# Patient Record
Sex: Female | Born: 1935
Health system: Southern US, Community
[De-identification: ages and names within clinical notes are randomized; demographics above are authoritative.]

## PROBLEM LIST (undated history)

## (undated) DIAGNOSIS — R32 Unspecified urinary incontinence: Secondary | ICD-10-CM

## (undated) DIAGNOSIS — D638 Anemia in other chronic diseases classified elsewhere: Secondary | ICD-10-CM

## (undated) DIAGNOSIS — I050 Rheumatic mitral stenosis: Secondary | ICD-10-CM

## (undated) DIAGNOSIS — I252 Old myocardial infarction: Secondary | ICD-10-CM

## (undated) DIAGNOSIS — I5032 Chronic diastolic (congestive) heart failure: Secondary | ICD-10-CM

## (undated) DIAGNOSIS — E785 Hyperlipidemia, unspecified: Secondary | ICD-10-CM

## (undated) DIAGNOSIS — R42 Dizziness and giddiness: Secondary | ICD-10-CM

## (undated) DIAGNOSIS — I272 Pulmonary hypertension, unspecified: Secondary | ICD-10-CM

## (undated) DIAGNOSIS — I351 Nonrheumatic aortic (valve) insufficiency: Secondary | ICD-10-CM

## (undated) DIAGNOSIS — E559 Vitamin D deficiency, unspecified: Secondary | ICD-10-CM

## (undated) DIAGNOSIS — E039 Hypothyroidism, unspecified: Secondary | ICD-10-CM

## (undated) DIAGNOSIS — E119 Type 2 diabetes mellitus without complications: Secondary | ICD-10-CM

## (undated) DIAGNOSIS — I4892 Unspecified atrial flutter: Secondary | ICD-10-CM

## (undated) DIAGNOSIS — I4821 Permanent atrial fibrillation: Secondary | ICD-10-CM

## (undated) DIAGNOSIS — I10 Essential (primary) hypertension: Secondary | ICD-10-CM

## (undated) HISTORY — DX: Unspecified atrial flutter: I48.92

## (undated) HISTORY — DX: Permanent atrial fibrillation: I48.21

## (undated) HISTORY — DX: Vitamin D deficiency, unspecified: E55.9

## (undated) HISTORY — DX: Anemia in other chronic diseases classified elsewhere: D63.8

## (undated) HISTORY — DX: Hyperlipidemia, unspecified: E78.5

## (undated) HISTORY — DX: Rheumatic mitral stenosis: I05.0

## (undated) HISTORY — DX: Hypothyroidism, unspecified: E03.9

## (undated) HISTORY — PX: CATARACT EXTRACTION: SUR2

## (undated) HISTORY — DX: Old myocardial infarction: I25.2

## (undated) HISTORY — DX: Pulmonary hypertension, unspecified: I27.20

## (undated) HISTORY — DX: Essential (primary) hypertension: I10

## (undated) HISTORY — DX: Nonrheumatic aortic (valve) insufficiency: I35.1

## (undated) HISTORY — DX: Dizziness and giddiness: R42

## (undated) HISTORY — DX: Chronic diastolic (congestive) heart failure: I50.32

---

## 2011-09-26 DIAGNOSIS — D638 Anemia in other chronic diseases classified elsewhere: Secondary | ICD-10-CM | POA: Diagnosis not present

## 2011-09-26 DIAGNOSIS — I10 Essential (primary) hypertension: Secondary | ICD-10-CM | POA: Diagnosis not present

## 2011-09-26 DIAGNOSIS — M2550 Pain in unspecified joint: Secondary | ICD-10-CM | POA: Diagnosis not present

## 2011-09-26 DIAGNOSIS — Z0000 Encounter for general adult medical examination without abnormal findings: Secondary | ICD-10-CM | POA: Diagnosis not present

## 2011-09-26 DIAGNOSIS — E119 Type 2 diabetes mellitus without complications: Secondary | ICD-10-CM | POA: Diagnosis not present

## 2011-09-26 DIAGNOSIS — E039 Hypothyroidism, unspecified: Secondary | ICD-10-CM | POA: Diagnosis not present

## 2011-09-26 DIAGNOSIS — E785 Hyperlipidemia, unspecified: Secondary | ICD-10-CM | POA: Diagnosis not present

## 2011-09-26 DIAGNOSIS — D518 Other vitamin B12 deficiency anemias: Secondary | ICD-10-CM | POA: Diagnosis not present

## 2011-12-30 DIAGNOSIS — I10 Essential (primary) hypertension: Secondary | ICD-10-CM | POA: Diagnosis not present

## 2012-01-07 DIAGNOSIS — Z13820 Encounter for screening for osteoporosis: Secondary | ICD-10-CM | POA: Diagnosis not present

## 2012-01-07 DIAGNOSIS — Z780 Asymptomatic menopausal state: Secondary | ICD-10-CM | POA: Diagnosis not present

## 2012-04-22 DIAGNOSIS — Z23 Encounter for immunization: Secondary | ICD-10-CM | POA: Diagnosis not present

## 2012-05-06 DIAGNOSIS — I10 Essential (primary) hypertension: Secondary | ICD-10-CM | POA: Diagnosis not present

## 2012-05-06 DIAGNOSIS — E039 Hypothyroidism, unspecified: Secondary | ICD-10-CM | POA: Diagnosis not present

## 2012-05-06 DIAGNOSIS — E119 Type 2 diabetes mellitus without complications: Secondary | ICD-10-CM | POA: Diagnosis not present

## 2012-06-23 DIAGNOSIS — I5032 Chronic diastolic (congestive) heart failure: Secondary | ICD-10-CM

## 2012-06-23 HISTORY — DX: Chronic diastolic (congestive) heart failure: I50.32

## 2012-08-12 DIAGNOSIS — E119 Type 2 diabetes mellitus without complications: Secondary | ICD-10-CM | POA: Diagnosis not present

## 2012-08-12 DIAGNOSIS — I10 Essential (primary) hypertension: Secondary | ICD-10-CM | POA: Diagnosis not present

## 2012-08-12 DIAGNOSIS — D638 Anemia in other chronic diseases classified elsewhere: Secondary | ICD-10-CM | POA: Diagnosis not present

## 2012-10-05 DIAGNOSIS — E119 Type 2 diabetes mellitus without complications: Secondary | ICD-10-CM | POA: Diagnosis not present

## 2012-10-05 DIAGNOSIS — J90 Pleural effusion, not elsewhere classified: Secondary | ICD-10-CM | POA: Diagnosis not present

## 2012-10-05 DIAGNOSIS — I252 Old myocardial infarction: Secondary | ICD-10-CM | POA: Diagnosis not present

## 2012-10-05 DIAGNOSIS — I5031 Acute diastolic (congestive) heart failure: Secondary | ICD-10-CM | POA: Diagnosis not present

## 2012-10-05 DIAGNOSIS — I4892 Unspecified atrial flutter: Secondary | ICD-10-CM | POA: Diagnosis not present

## 2012-10-05 DIAGNOSIS — I10 Essential (primary) hypertension: Secondary | ICD-10-CM | POA: Diagnosis not present

## 2012-10-05 DIAGNOSIS — D649 Anemia, unspecified: Secondary | ICD-10-CM | POA: Diagnosis not present

## 2012-10-05 DIAGNOSIS — I270 Primary pulmonary hypertension: Secondary | ICD-10-CM | POA: Diagnosis not present

## 2012-10-05 DIAGNOSIS — R0902 Hypoxemia: Secondary | ICD-10-CM | POA: Diagnosis not present

## 2012-10-05 DIAGNOSIS — R5381 Other malaise: Secondary | ICD-10-CM | POA: Diagnosis not present

## 2012-10-05 DIAGNOSIS — I517 Cardiomegaly: Secondary | ICD-10-CM | POA: Diagnosis not present

## 2012-10-05 DIAGNOSIS — E039 Hypothyroidism, unspecified: Secondary | ICD-10-CM | POA: Diagnosis not present

## 2012-10-05 DIAGNOSIS — J810 Acute pulmonary edema: Secondary | ICD-10-CM | POA: Diagnosis not present

## 2012-10-05 DIAGNOSIS — I509 Heart failure, unspecified: Secondary | ICD-10-CM | POA: Diagnosis not present

## 2012-10-05 DIAGNOSIS — I059 Rheumatic mitral valve disease, unspecified: Secondary | ICD-10-CM | POA: Diagnosis not present

## 2012-10-05 DIAGNOSIS — K2990 Gastroduodenitis, unspecified, without bleeding: Secondary | ICD-10-CM | POA: Diagnosis not present

## 2012-10-05 DIAGNOSIS — Z6841 Body Mass Index (BMI) 40.0 and over, adult: Secondary | ICD-10-CM | POA: Diagnosis not present

## 2012-10-05 DIAGNOSIS — D509 Iron deficiency anemia, unspecified: Secondary | ICD-10-CM | POA: Diagnosis not present

## 2012-10-05 DIAGNOSIS — K2970 Gastritis, unspecified, without bleeding: Secondary | ICD-10-CM | POA: Diagnosis present

## 2012-10-05 DIAGNOSIS — E785 Hyperlipidemia, unspecified: Secondary | ICD-10-CM | POA: Diagnosis present

## 2012-10-05 DIAGNOSIS — I4891 Unspecified atrial fibrillation: Secondary | ICD-10-CM | POA: Diagnosis not present

## 2012-10-05 DIAGNOSIS — J811 Chronic pulmonary edema: Secondary | ICD-10-CM | POA: Diagnosis not present

## 2012-10-05 DIAGNOSIS — I2789 Other specified pulmonary heart diseases: Secondary | ICD-10-CM | POA: Diagnosis not present

## 2012-10-05 DIAGNOSIS — I5033 Acute on chronic diastolic (congestive) heart failure: Secondary | ICD-10-CM | POA: Diagnosis not present

## 2012-10-05 DIAGNOSIS — R0602 Shortness of breath: Secondary | ICD-10-CM | POA: Diagnosis not present

## 2012-10-19 DIAGNOSIS — I4892 Unspecified atrial flutter: Secondary | ICD-10-CM | POA: Diagnosis not present

## 2012-10-19 DIAGNOSIS — I10 Essential (primary) hypertension: Secondary | ICD-10-CM | POA: Diagnosis not present

## 2012-10-19 DIAGNOSIS — I501 Left ventricular failure: Secondary | ICD-10-CM | POA: Diagnosis not present

## 2012-10-19 DIAGNOSIS — I4891 Unspecified atrial fibrillation: Secondary | ICD-10-CM | POA: Diagnosis not present

## 2012-10-19 DIAGNOSIS — E119 Type 2 diabetes mellitus without complications: Secondary | ICD-10-CM | POA: Diagnosis not present

## 2012-10-19 DIAGNOSIS — Z9981 Dependence on supplemental oxygen: Secondary | ICD-10-CM | POA: Diagnosis not present

## 2012-10-19 DIAGNOSIS — Z794 Long term (current) use of insulin: Secondary | ICD-10-CM | POA: Diagnosis not present

## 2012-10-21 DIAGNOSIS — I4892 Unspecified atrial flutter: Secondary | ICD-10-CM | POA: Diagnosis not present

## 2012-10-21 DIAGNOSIS — I10 Essential (primary) hypertension: Secondary | ICD-10-CM | POA: Diagnosis not present

## 2012-10-21 DIAGNOSIS — I501 Left ventricular failure: Secondary | ICD-10-CM | POA: Diagnosis not present

## 2012-10-21 DIAGNOSIS — Z9981 Dependence on supplemental oxygen: Secondary | ICD-10-CM | POA: Diagnosis not present

## 2012-10-21 DIAGNOSIS — I4891 Unspecified atrial fibrillation: Secondary | ICD-10-CM | POA: Diagnosis not present

## 2012-10-21 DIAGNOSIS — E119 Type 2 diabetes mellitus without complications: Secondary | ICD-10-CM | POA: Diagnosis not present

## 2012-10-25 DIAGNOSIS — D649 Anemia, unspecified: Secondary | ICD-10-CM | POA: Diagnosis not present

## 2012-10-25 DIAGNOSIS — E8779 Other fluid overload: Secondary | ICD-10-CM | POA: Diagnosis not present

## 2012-10-25 DIAGNOSIS — I10 Essential (primary) hypertension: Secondary | ICD-10-CM | POA: Diagnosis not present

## 2012-10-25 DIAGNOSIS — E119 Type 2 diabetes mellitus without complications: Secondary | ICD-10-CM | POA: Diagnosis not present

## 2012-10-25 DIAGNOSIS — I4892 Unspecified atrial flutter: Secondary | ICD-10-CM | POA: Diagnosis not present

## 2012-10-26 DIAGNOSIS — E119 Type 2 diabetes mellitus without complications: Secondary | ICD-10-CM | POA: Diagnosis not present

## 2012-10-26 DIAGNOSIS — Z9981 Dependence on supplemental oxygen: Secondary | ICD-10-CM | POA: Diagnosis not present

## 2012-10-26 DIAGNOSIS — I501 Left ventricular failure: Secondary | ICD-10-CM | POA: Diagnosis not present

## 2012-10-26 DIAGNOSIS — I10 Essential (primary) hypertension: Secondary | ICD-10-CM | POA: Diagnosis not present

## 2012-10-26 DIAGNOSIS — I4891 Unspecified atrial fibrillation: Secondary | ICD-10-CM | POA: Diagnosis not present

## 2012-10-26 DIAGNOSIS — I4892 Unspecified atrial flutter: Secondary | ICD-10-CM | POA: Diagnosis not present

## 2012-10-29 DIAGNOSIS — Z9981 Dependence on supplemental oxygen: Secondary | ICD-10-CM | POA: Diagnosis not present

## 2012-10-29 DIAGNOSIS — I501 Left ventricular failure: Secondary | ICD-10-CM | POA: Diagnosis not present

## 2012-10-29 DIAGNOSIS — I4891 Unspecified atrial fibrillation: Secondary | ICD-10-CM | POA: Diagnosis not present

## 2012-10-29 DIAGNOSIS — I4892 Unspecified atrial flutter: Secondary | ICD-10-CM | POA: Diagnosis not present

## 2012-10-29 DIAGNOSIS — I10 Essential (primary) hypertension: Secondary | ICD-10-CM | POA: Diagnosis not present

## 2012-10-29 DIAGNOSIS — E119 Type 2 diabetes mellitus without complications: Secondary | ICD-10-CM | POA: Diagnosis not present

## 2012-11-01 DIAGNOSIS — I10 Essential (primary) hypertension: Secondary | ICD-10-CM | POA: Diagnosis not present

## 2012-11-01 DIAGNOSIS — I5032 Chronic diastolic (congestive) heart failure: Secondary | ICD-10-CM | POA: Diagnosis not present

## 2012-11-01 DIAGNOSIS — I2789 Other specified pulmonary heart diseases: Secondary | ICD-10-CM | POA: Diagnosis not present

## 2012-11-01 DIAGNOSIS — I4891 Unspecified atrial fibrillation: Secondary | ICD-10-CM | POA: Diagnosis not present

## 2012-11-02 DIAGNOSIS — I501 Left ventricular failure: Secondary | ICD-10-CM | POA: Diagnosis not present

## 2012-11-02 DIAGNOSIS — I4891 Unspecified atrial fibrillation: Secondary | ICD-10-CM | POA: Diagnosis not present

## 2012-11-02 DIAGNOSIS — I10 Essential (primary) hypertension: Secondary | ICD-10-CM | POA: Diagnosis not present

## 2012-11-02 DIAGNOSIS — E119 Type 2 diabetes mellitus without complications: Secondary | ICD-10-CM | POA: Diagnosis not present

## 2012-11-02 DIAGNOSIS — Z9981 Dependence on supplemental oxygen: Secondary | ICD-10-CM | POA: Diagnosis not present

## 2012-11-02 DIAGNOSIS — I4892 Unspecified atrial flutter: Secondary | ICD-10-CM | POA: Diagnosis not present

## 2012-11-05 DIAGNOSIS — I501 Left ventricular failure: Secondary | ICD-10-CM | POA: Diagnosis not present

## 2012-11-05 DIAGNOSIS — E119 Type 2 diabetes mellitus without complications: Secondary | ICD-10-CM | POA: Diagnosis not present

## 2012-11-05 DIAGNOSIS — I10 Essential (primary) hypertension: Secondary | ICD-10-CM | POA: Diagnosis not present

## 2012-11-05 DIAGNOSIS — I4891 Unspecified atrial fibrillation: Secondary | ICD-10-CM | POA: Diagnosis not present

## 2012-11-05 DIAGNOSIS — Z9981 Dependence on supplemental oxygen: Secondary | ICD-10-CM | POA: Diagnosis not present

## 2012-11-05 DIAGNOSIS — I4892 Unspecified atrial flutter: Secondary | ICD-10-CM | POA: Diagnosis not present

## 2012-11-10 DIAGNOSIS — Z79899 Other long term (current) drug therapy: Secondary | ICD-10-CM | POA: Diagnosis not present

## 2012-11-11 DIAGNOSIS — E119 Type 2 diabetes mellitus without complications: Secondary | ICD-10-CM | POA: Diagnosis not present

## 2012-11-11 DIAGNOSIS — I4891 Unspecified atrial fibrillation: Secondary | ICD-10-CM | POA: Diagnosis not present

## 2012-11-11 DIAGNOSIS — I501 Left ventricular failure: Secondary | ICD-10-CM | POA: Diagnosis not present

## 2012-11-11 DIAGNOSIS — I4892 Unspecified atrial flutter: Secondary | ICD-10-CM | POA: Diagnosis not present

## 2012-11-11 DIAGNOSIS — Z9981 Dependence on supplemental oxygen: Secondary | ICD-10-CM | POA: Diagnosis not present

## 2012-11-11 DIAGNOSIS — I10 Essential (primary) hypertension: Secondary | ICD-10-CM | POA: Diagnosis not present

## 2012-11-19 DIAGNOSIS — I4891 Unspecified atrial fibrillation: Secondary | ICD-10-CM | POA: Diagnosis not present

## 2012-11-19 DIAGNOSIS — E119 Type 2 diabetes mellitus without complications: Secondary | ICD-10-CM | POA: Diagnosis not present

## 2012-11-19 DIAGNOSIS — Z9981 Dependence on supplemental oxygen: Secondary | ICD-10-CM | POA: Diagnosis not present

## 2012-11-19 DIAGNOSIS — I4892 Unspecified atrial flutter: Secondary | ICD-10-CM | POA: Diagnosis not present

## 2012-11-19 DIAGNOSIS — I10 Essential (primary) hypertension: Secondary | ICD-10-CM | POA: Diagnosis not present

## 2012-11-19 DIAGNOSIS — I501 Left ventricular failure: Secondary | ICD-10-CM | POA: Diagnosis not present

## 2012-11-27 DIAGNOSIS — I4891 Unspecified atrial fibrillation: Secondary | ICD-10-CM | POA: Diagnosis not present

## 2012-11-27 DIAGNOSIS — Z9981 Dependence on supplemental oxygen: Secondary | ICD-10-CM | POA: Diagnosis not present

## 2012-11-27 DIAGNOSIS — I10 Essential (primary) hypertension: Secondary | ICD-10-CM | POA: Diagnosis not present

## 2012-11-27 DIAGNOSIS — E119 Type 2 diabetes mellitus without complications: Secondary | ICD-10-CM | POA: Diagnosis not present

## 2012-11-27 DIAGNOSIS — I4892 Unspecified atrial flutter: Secondary | ICD-10-CM | POA: Diagnosis not present

## 2012-11-27 DIAGNOSIS — I501 Left ventricular failure: Secondary | ICD-10-CM | POA: Diagnosis not present

## 2012-12-02 DIAGNOSIS — E119 Type 2 diabetes mellitus without complications: Secondary | ICD-10-CM | POA: Diagnosis not present

## 2012-12-08 DIAGNOSIS — I10 Essential (primary) hypertension: Secondary | ICD-10-CM | POA: Diagnosis not present

## 2012-12-08 DIAGNOSIS — I501 Left ventricular failure: Secondary | ICD-10-CM | POA: Diagnosis not present

## 2012-12-08 DIAGNOSIS — I4892 Unspecified atrial flutter: Secondary | ICD-10-CM | POA: Diagnosis not present

## 2012-12-08 DIAGNOSIS — I4891 Unspecified atrial fibrillation: Secondary | ICD-10-CM | POA: Diagnosis not present

## 2012-12-08 DIAGNOSIS — E119 Type 2 diabetes mellitus without complications: Secondary | ICD-10-CM | POA: Diagnosis not present

## 2012-12-08 DIAGNOSIS — Z9981 Dependence on supplemental oxygen: Secondary | ICD-10-CM | POA: Diagnosis not present

## 2012-12-14 DIAGNOSIS — I10 Essential (primary) hypertension: Secondary | ICD-10-CM | POA: Diagnosis not present

## 2012-12-14 DIAGNOSIS — I2510 Atherosclerotic heart disease of native coronary artery without angina pectoris: Secondary | ICD-10-CM | POA: Diagnosis not present

## 2012-12-14 DIAGNOSIS — I4891 Unspecified atrial fibrillation: Secondary | ICD-10-CM | POA: Diagnosis not present

## 2012-12-14 DIAGNOSIS — I5030 Unspecified diastolic (congestive) heart failure: Secondary | ICD-10-CM | POA: Diagnosis not present

## 2013-02-03 DIAGNOSIS — E119 Type 2 diabetes mellitus without complications: Secondary | ICD-10-CM | POA: Diagnosis not present

## 2013-02-03 DIAGNOSIS — D649 Anemia, unspecified: Secondary | ICD-10-CM | POA: Diagnosis not present

## 2013-02-03 DIAGNOSIS — I4892 Unspecified atrial flutter: Secondary | ICD-10-CM | POA: Diagnosis not present

## 2013-02-03 DIAGNOSIS — I10 Essential (primary) hypertension: Secondary | ICD-10-CM | POA: Diagnosis not present

## 2013-04-07 DIAGNOSIS — E785 Hyperlipidemia, unspecified: Secondary | ICD-10-CM | POA: Diagnosis not present

## 2013-04-08 DIAGNOSIS — Z23 Encounter for immunization: Secondary | ICD-10-CM | POA: Diagnosis not present

## 2013-04-12 DIAGNOSIS — I10 Essential (primary) hypertension: Secondary | ICD-10-CM | POA: Diagnosis not present

## 2013-04-12 DIAGNOSIS — I4891 Unspecified atrial fibrillation: Secondary | ICD-10-CM | POA: Diagnosis not present

## 2013-04-12 DIAGNOSIS — I2510 Atherosclerotic heart disease of native coronary artery without angina pectoris: Secondary | ICD-10-CM | POA: Diagnosis not present

## 2013-04-12 DIAGNOSIS — I5032 Chronic diastolic (congestive) heart failure: Secondary | ICD-10-CM | POA: Diagnosis not present

## 2013-05-11 DIAGNOSIS — E039 Hypothyroidism, unspecified: Secondary | ICD-10-CM | POA: Diagnosis not present

## 2013-05-11 DIAGNOSIS — D649 Anemia, unspecified: Secondary | ICD-10-CM | POA: Diagnosis not present

## 2013-05-11 DIAGNOSIS — M899 Disorder of bone, unspecified: Secondary | ICD-10-CM | POA: Diagnosis not present

## 2013-05-11 DIAGNOSIS — E119 Type 2 diabetes mellitus without complications: Secondary | ICD-10-CM | POA: Diagnosis not present

## 2013-05-11 DIAGNOSIS — I10 Essential (primary) hypertension: Secondary | ICD-10-CM | POA: Diagnosis not present

## 2013-07-18 DIAGNOSIS — I252 Old myocardial infarction: Secondary | ICD-10-CM

## 2013-07-18 HISTORY — DX: Old myocardial infarction: I25.2

## 2013-08-10 DIAGNOSIS — I4892 Unspecified atrial flutter: Secondary | ICD-10-CM | POA: Diagnosis not present

## 2013-08-11 DIAGNOSIS — E039 Hypothyroidism, unspecified: Secondary | ICD-10-CM | POA: Diagnosis not present

## 2013-08-11 DIAGNOSIS — D509 Iron deficiency anemia, unspecified: Secondary | ICD-10-CM | POA: Diagnosis not present

## 2013-08-18 DIAGNOSIS — I4892 Unspecified atrial flutter: Secondary | ICD-10-CM | POA: Diagnosis not present

## 2013-08-18 DIAGNOSIS — Z7901 Long term (current) use of anticoagulants: Secondary | ICD-10-CM | POA: Diagnosis not present

## 2013-08-23 DIAGNOSIS — Z7901 Long term (current) use of anticoagulants: Secondary | ICD-10-CM | POA: Diagnosis not present

## 2013-08-23 DIAGNOSIS — I4892 Unspecified atrial flutter: Secondary | ICD-10-CM | POA: Diagnosis not present

## 2013-09-06 DIAGNOSIS — I4892 Unspecified atrial flutter: Secondary | ICD-10-CM | POA: Diagnosis not present

## 2013-09-06 DIAGNOSIS — Z7901 Long term (current) use of anticoagulants: Secondary | ICD-10-CM | POA: Diagnosis not present

## 2013-10-11 ENCOUNTER — Emergency Department (HOSPITAL_COMMUNITY)
Admission: EM | Admit: 2013-10-11 | Discharge: 2013-10-11 | Disposition: A | Discharge status: Home / Self Care | Payer: Medicare Other | Attending: Emergency Medicine | Admitting: Emergency Medicine

## 2013-10-11 ENCOUNTER — Emergency Department (HOSPITAL_COMMUNITY): Payer: Medicare Other

## 2013-10-11 ENCOUNTER — Encounter (HOSPITAL_COMMUNITY): Payer: Self-pay | Admitting: Emergency Medicine

## 2013-10-11 DIAGNOSIS — I509 Heart failure, unspecified: Secondary | ICD-10-CM | POA: Diagnosis not present

## 2013-10-11 DIAGNOSIS — W19XXXA Unspecified fall, initial encounter: Secondary | ICD-10-CM

## 2013-10-11 DIAGNOSIS — Z7901 Long term (current) use of anticoagulants: Secondary | ICD-10-CM | POA: Insufficient documentation

## 2013-10-11 DIAGNOSIS — M25559 Pain in unspecified hip: Secondary | ICD-10-CM | POA: Diagnosis not present

## 2013-10-11 DIAGNOSIS — R791 Abnormal coagulation profile: Secondary | ICD-10-CM | POA: Insufficient documentation

## 2013-10-11 DIAGNOSIS — S79929A Unspecified injury of unspecified thigh, initial encounter: Principal | ICD-10-CM

## 2013-10-11 DIAGNOSIS — R51 Headache: Secondary | ICD-10-CM | POA: Diagnosis not present

## 2013-10-11 DIAGNOSIS — M25562 Pain in left knee: Secondary | ICD-10-CM

## 2013-10-11 DIAGNOSIS — Z79899 Other long term (current) drug therapy: Secondary | ICD-10-CM | POA: Diagnosis not present

## 2013-10-11 DIAGNOSIS — M25569 Pain in unspecified knee: Secondary | ICD-10-CM | POA: Diagnosis not present

## 2013-10-11 DIAGNOSIS — E119 Type 2 diabetes mellitus without complications: Secondary | ICD-10-CM | POA: Insufficient documentation

## 2013-10-11 DIAGNOSIS — Y929 Unspecified place or not applicable: Secondary | ICD-10-CM | POA: Insufficient documentation

## 2013-10-11 DIAGNOSIS — Y9389 Activity, other specified: Secondary | ICD-10-CM | POA: Insufficient documentation

## 2013-10-11 DIAGNOSIS — S99929A Unspecified injury of unspecified foot, initial encounter: Secondary | ICD-10-CM

## 2013-10-11 DIAGNOSIS — S99919A Unspecified injury of unspecified ankle, initial encounter: Secondary | ICD-10-CM

## 2013-10-11 DIAGNOSIS — S79919A Unspecified injury of unspecified hip, initial encounter: Secondary | ICD-10-CM | POA: Insufficient documentation

## 2013-10-11 DIAGNOSIS — S0993XA Unspecified injury of face, initial encounter: Secondary | ICD-10-CM | POA: Diagnosis not present

## 2013-10-11 DIAGNOSIS — I4891 Unspecified atrial fibrillation: Secondary | ICD-10-CM | POA: Insufficient documentation

## 2013-10-11 DIAGNOSIS — S8990XA Unspecified injury of unspecified lower leg, initial encounter: Secondary | ICD-10-CM | POA: Insufficient documentation

## 2013-10-11 DIAGNOSIS — S0990XA Unspecified injury of head, initial encounter: Secondary | ICD-10-CM | POA: Diagnosis not present

## 2013-10-11 DIAGNOSIS — R296 Repeated falls: Secondary | ICD-10-CM | POA: Insufficient documentation

## 2013-10-11 HISTORY — DX: Type 2 diabetes mellitus without complications: E11.9

## 2013-10-11 LAB — COMPREHENSIVE METABOLIC PANEL
ALT: 11 U/L (ref 0–35)
AST: 16 U/L (ref 0–37)
Albumin: 3.5 g/dL (ref 3.5–5.2)
Alkaline Phosphatase: 82 U/L (ref 39–117)
BILIRUBIN TOTAL: 0.4 mg/dL (ref 0.3–1.2)
BUN: 18 mg/dL (ref 6–23)
CALCIUM: 9.6 mg/dL (ref 8.4–10.5)
CHLORIDE: 103 meq/L (ref 96–112)
CO2: 22 meq/L (ref 19–32)
CREATININE: 0.84 mg/dL (ref 0.50–1.10)
GFR, EST AFRICAN AMERICAN: 75 mL/min — AB (ref >90)
GFR, EST NON AFRICAN AMERICAN: 64 mL/min — AB (ref >90)
GLUCOSE: 115 mg/dL — AB (ref 70–99)
Potassium: 4.3 mEq/L (ref 3.7–5.3)
Sodium: 137 mEq/L (ref 137–147)
Total Protein: 7.3 g/dL (ref 6.0–8.3)

## 2013-10-11 LAB — URINALYSIS, ROUTINE W REFLEX MICROSCOPIC
BILIRUBIN URINE: NEGATIVE
Glucose, UA: NEGATIVE mg/dL
Ketones, ur: NEGATIVE mg/dL
Leukocytes, UA: NEGATIVE
Nitrite: NEGATIVE
Protein, ur: NEGATIVE mg/dL
Specific Gravity, Urine: 1.016 (ref 1.005–1.030)
Urobilinogen, UA: 1 mg/dL (ref 0.0–1.0)
pH: 6.5 (ref 5.0–8.0)

## 2013-10-11 LAB — CBC WITH DIFFERENTIAL/PLATELET
BASOS ABS: 0 10*3/uL (ref 0.0–0.1)
Basophils Relative: 0 % (ref 0–1)
EOS PCT: 2 % (ref 0–5)
Eosinophils Absolute: 0.1 10*3/uL (ref 0.0–0.7)
HCT: 35.9 % — ABNORMAL LOW (ref 36.0–46.0)
HEMOGLOBIN: 11.9 g/dL — AB (ref 12.0–15.0)
LYMPHS ABS: 2.2 10*3/uL (ref 0.7–4.0)
Lymphocytes Relative: 43 % (ref 12–46)
MCH: 30 pg (ref 26.0–34.0)
MCHC: 33.1 g/dL (ref 30.0–36.0)
MCV: 90.4 fL (ref 78.0–100.0)
MONO ABS: 0.4 10*3/uL (ref 0.1–1.0)
MONOS PCT: 9 % (ref 3–12)
NEUTROS ABS: 2.3 10*3/uL (ref 1.7–7.7)
Neutrophils Relative %: 46 % (ref 43–77)
Platelets: 276 10*3/uL (ref 150–400)
RBC: 3.97 MIL/uL (ref 3.87–5.11)
RDW: 13.5 % (ref 11.5–15.5)
WBC: 5 10*3/uL (ref 4.0–10.5)

## 2013-10-11 LAB — URINE MICROSCOPIC-ADD ON

## 2013-10-11 LAB — CBG MONITORING, ED: Glucose-Capillary: 102 mg/dL — ABNORMAL HIGH (ref 70–99)

## 2013-10-11 LAB — PROTIME-INR
INR: 1.44 (ref 0.00–1.49)
PROTHROMBIN TIME: 17.2 s — AB (ref 11.6–15.2)

## 2013-10-11 MED ORDER — ACETAMINOPHEN 325 MG PO TABS
650.0000 mg | ORAL_TABLET | Freq: Once | ORAL | Status: AC
  Administered 2013-10-11: 650 mg via ORAL
  Filled 2013-10-11: qty 2

## 2013-10-11 NOTE — ED Notes (Signed)
Kelly, PA at bedside. 

## 2013-10-11 NOTE — ED Provider Notes (Signed)
CSN: [id number]     Arrival date & time 10/11/13  1916 History   First MD Initiated Contact with Patient 10/11/13 2057     Chief Complaint  Patient presents with  . Fall    (Consider location/radiation/quality/duration/timing/severity/associated sxs/prior Treatment) HPI Comments: Patient is a 80 year old female with a history of diabetes mellitus, CHF, and atrial fibrillation, on chronic Coumadin, who presents to the emergency department today for fall. Patient states that she lost her balance causing her left side to fall into the wall. Patient is unsure as to whether she hit her head, but she denies loss of consciousness. Patient complaining of aching pain in her left hip, left knee, and left ankle. Patient denies radiation of the pain and any alleviating factors. Pain is aggravated with movement and palpation to the area. Patient also complaining of a right-sided temporal headache that is nonradiating. She denies vision changes, hearing changes or tinnitus, difficulty speaking or swallowing, abdominal pain, back pain, nausea or vomiting, bowel or bladder incontinence, numbness/tingling, and extremity weakness. Patient has been ambulatory since the incident with her cane which she uses to ambulate at baseline. Patient lives in a home with her son.  The history is provided by the patient. No language interpreter was used.    Past Medical History  Diagnosis Date  . CHF (congestive heart failure)   . Diabetes mellitus without complication   . A-fib    Past Surgical History  Procedure Laterality Date  . Cataract extraction     No family history on file. History  Substance Use Topics  . Smoking status: Never Smoker   . Smokeless tobacco: Not on file  . Alcohol Use: Yes   OB History   Grav Para Term Preterm Abortions TAB SAB Ect Mult Living                 Review of Systems  HENT: Negative for hearing loss and tinnitus.   Eyes: Negative for visual disturbance.  Gastrointestinal:  Negative for nausea, vomiting and abdominal pain.  Musculoskeletal: Positive for arthralgias and myalgias. Negative for back pain and neck pain.  Neurological: Positive for headaches. Negative for syncope, speech difficulty, weakness and numbness.  All other systems reviewed and are negative.    Allergies  Review of patient's allergies indicates no known allergies.  Home Medications   Prior to Admission medications   Medication Sig Start Date End Date Taking? Authorizing Provider  Cholecalciferol (VITAMIN D3) 2000 UNITS TABS Take 1 tablet by mouth daily.   Yes Historical Provider, MD  furosemide (LASIX) 20 MG tablet Take 20 mg by mouth daily.   Yes Historical Provider, MD  losartan (COZAAR) 100 MG tablet Take 100 mg by mouth daily.   Yes Historical Provider, MD  metFORMIN (GLUCOPHAGE) 1000 MG tablet Take 1,000 mg by mouth 2 (two) times daily with a meal.   Yes Historical Provider, MD  metoprolol (LOPRESSOR) 50 MG tablet Take 50 mg by mouth 2 (two) times daily.   Yes Historical Provider, MD  Omega-3 Krill Oil 500 MG CAPS Take 1 capsule by mouth daily.   Yes Historical Provider, MD  warfarin (COUMADIN) 2.5 MG tablet Take 1.25-2.5 mg by mouth See admin instructions. Take 0.5 tablet Thursday and Sunday. Take 1 tablet all other days.   Yes Historical Provider, MD   BP 172/68  Pulse 66  Temp(Src) 98.2 F (36.8 C) (Oral)  Resp 16  Wt 165 lb 1 oz (74.872 kg)  SpO2 100%  Physical Exam  Nursing  note and vitals reviewed. Constitutional: She is oriented to person, place, and time. She appears well-developed and well-nourished. No distress.  Patient is pleasant and well-appearing. She answers questions appropriately and follows simple commands.  HENT:  Head: Normocephalic and atraumatic. Head is without raccoon's eyes and without Battle's sign.  Nose: Nose normal.  Mouth/Throat: Oropharynx is clear and moist. No oropharyngeal exudate.  Eyes: Conjunctivae and EOM are normal. Pupils are equal,  round, and reactive to light. No scleral icterus.  Neck: Normal range of motion.  Cardiovascular: Normal rate, regular rhythm and intact distal pulses.   Pulmonary/Chest: Effort normal and breath sounds normal. No respiratory distress. She has no wheezes. She has no rales.  Abdominal: Soft. She exhibits no distension. There is no tenderness. There is no rebound and no guarding.  Musculoskeletal: Normal range of motion.       Left hip: She exhibits tenderness and bony tenderness. She exhibits normal range of motion, normal strength, no crepitus and no deformity.       Left knee: She exhibits ecchymosis and bony tenderness. She exhibits normal range of motion, no swelling, no effusion, no deformity, no erythema, normal alignment, no LCL laxity and no MCL laxity. Tenderness found.       Left ankle: Normal. She exhibits normal range of motion, no swelling and no ecchymosis. No tenderness. Achilles tendon normal.       Lumbar back: Normal.       Left upper leg: Normal.       Left lower leg: Normal.  No left lower extremity shortening or malrotation. Patient with mild ecchymosis to her left knee with mild tenderness to palpation.  Neurological: She is alert and oriented to person, place, and time. She has normal strength. No cranial nerve deficit or sensory deficit. She exhibits normal muscle tone. GCS eye subscore is 4. GCS verbal subscore is 5. GCS motor subscore is 6.  GCS 15. Speech is goal oriented and intelligible. No cranial nerve deficits appreciated; symmetric eyebrow raise, no facial drooping. Patient moves extremities without ataxia. No gross sensory deficits appreciated. Patient ambulatory in ED hallway using cane and one-person assist with slow, steady gait.  Skin: Skin is warm and dry. No rash noted. She is not diaphoretic. No erythema. No pallor.  Psychiatric: She has a normal mood and affect. Her behavior is normal.    ED Course  Procedures (including critical care time) Labs  Review Labs Reviewed  CBC WITH DIFFERENTIAL - Abnormal; Notable for the following:    Hemoglobin 11.9 (*)    HCT 35.9 (*)    All other components within normal limits  COMPREHENSIVE METABOLIC PANEL - Abnormal; Notable for the following:    Glucose, Bld 115 (*)    GFR calc non Af Amer 64 (*)    GFR calc Af Amer 75 (*)    All other components within normal limits  URINALYSIS, ROUTINE W REFLEX MICROSCOPIC - Abnormal; Notable for the following:    Hgb urine dipstick TRACE (*)    All other components within normal limits  PROTIME-INR - Abnormal; Notable for the following:    Prothrombin Time 17.2 (*)    All other components within normal limits  URINE MICROSCOPIC-ADD ON - Abnormal; Notable for the following:    Squamous Epithelial / LPF FEW (*)    Casts HYALINE CASTS (*)    All other components within normal limits  CBG MONITORING, ED - Abnormal; Notable for the following:    Glucose-Capillary 102 (*)  All other components within normal limits    Imaging Review Dg Hip Complete Left  10/11/2013   CLINICAL DATA:  Left leg pain after fall.  EXAM: LEFT HIP - COMPLETE 2+ VIEW  COMPARISON:  DG KNEE COMPLETE 4 VIEWS*L* dated 10/11/2013; DG ANKLE COMPLETE*L* dated 10/11/2013  FINDINGS: Degenerative changes in the lower lumbar spine and hips. No displaced fractures identified. No focal bone lesion or bone destruction.  IMPRESSION: No acute bony abnormalities.   Electronically Signed   By: Lucienne Capers M.D.   On: 10/11/2013 22:01   Dg Ankle Complete Left  10/11/2013   CLINICAL DATA:  Left leg pain after a fall.  EXAM: LEFT ANKLE COMPLETE - 3+ VIEW  COMPARISON:  None.  FINDINGS: There is no evidence of fracture, dislocation, or joint effusion. There is no evidence of arthropathy or other focal bone abnormality. Soft tissues are unremarkable. Vascular calcifications. Plantar and Achilles calcaneal spurs.  IMPRESSION: Negative.   Electronically Signed   By: Lucienne Capers M.D.   On: 10/11/2013  22:03   Ct Head Wo Contrast  10/11/2013   CLINICAL DATA:  Fall  EXAM: CT HEAD WITHOUT CONTRAST  CT CERVICAL SPINE WITHOUT CONTRAST  TECHNIQUE: Multidetector CT imaging of the head and cervical spine was performed following the standard protocol without intravenous contrast. Multiplanar CT image reconstructions of the cervical spine were also generated.  COMPARISON:  None.  FINDINGS: CT HEAD FINDINGS  Global atrophy. Chronic ischemic changes. No mass effect, midline shift, or acute intracranial hemorrhage. Cranium is intact. 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 No fracture. No dislocation. No obvious epidural mass effect to suggest hematoma. High density in the epidural space at C1 region is felt to be within normal limits.  IMPRESSION: No acute intracranial pathology. No evidence of cervical spine injury.   Electronically Signed   By: Maryclare Bean M.D.   On: 10/11/2013 22:33   Ct Cervical Spine Wo Contrast  10/11/2013   CLINICAL DATA:  Fall  EXAM: CT HEAD WITHOUT CONTRAST  CT CERVICAL SPINE WITHOUT CONTRAST  TECHNIQUE: Multidetector CT imaging of the head and cervical spine was performed following the standard protocol without intravenous contrast. Multiplanar CT image reconstructions of the cervical spine were also generated.  COMPARISON:  None.  FINDINGS: CT HEAD FINDINGS  Global atrophy. Chronic ischemic changes. No mass effect, midline shift, or acute intracranial hemorrhage. Cranium is intact. 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 No fracture. No dislocation. No obvious epidural mass effect to suggest hematoma. High density in the epidural space at C1 region is felt to be within normal limits.  IMPRESSION: No acute intracranial pathology. No evidence of cervical spine injury.   Electronically Signed   By: Maryclare Bean M.D.   On: 10/11/2013 22:33   Dg Knee Complete 4  Views Left  10/11/2013   CLINICAL DATA:  Left leg pain after a fall.  EXAM: LEFT KNEE - COMPLETE 4+ VIEW  COMPARISON:  DG HIP COMPLETE*L* dated 10/11/2013  FINDINGS: Degenerative changes with medial compartment narrowing and mild hypertrophic changes. Small effusion. No evidence of acute fracture or dislocation. Vascular calcifications.  IMPRESSION: No acute bony abnormalities. Small effusion and degenerative changes present.   Electronically Signed   By: Lucienne Capers M.D.   On: 10/11/2013 22:02     EKG Interpretation None  MDM   Final diagnoses:  Fall  Hip pain  Knee pain, left  Subtherapeutic international normalized ratio (INR)    10/11/13 2330 - Patient presents to the ED after a fall into the wall today with impact to her L side. Patient mentating at baseline. Neurologic exam is nonfocal. Patient on chronic coumadin; level subtherapeutic today. No significant findings on examination today; TTP appears MSK in origin. Imaging ordered and reviewed which shows no evidence of acute fracture or dislocation. CT head without hemorrhage, hydrocephalus, skull fracture, or midline shift. CT cervical spine without acute findings.   Patient has been able to ambulate with slow steady gait in the ED with her cane and 1 person assist. Given her discomfort related to contusions from her fall, believe patient would be better suited to have a walker while ambulating. Have discussed with family a consultation to care management in the AM. Patient is otherwise stable and appropriate for d/c home where she lives with her son and daughter in law. Return precautions provided and PCP f/u advised. Patient and family agreeable to plan with no unaddressed concerns.  10/12/13 0700 - Have spoken with Ellan Lambert of Care Management regarding having walker delivered to patient's home. Ms. Oren Section has graciously agreed to coordinate this for the patient today. Orders for DME rolling walker placed.   Filed  Vitals:   10/11/13 1922 10/11/13 2005 10/11/13 2100 10/11/13 2356  BP: 158/91 159/95 172/68 168/88  Pulse: 88  66 78  Temp: 98.2 F (36.8 C)   98 F (36.7 C)  TempSrc: Oral   Oral  Resp: 20 19 16 20   Weight: 123XX123 lb 1 oz (74.872 kg)     SpO2: 100% 100% 100% 96%     Antonietta Breach, PA-C 10/12/13 [phone]

## 2013-10-11 NOTE — ED Notes (Signed)
Pt ambulated with cane and one person assist down hallway in POD E with slow, steady gait. PA Claiborne Billings made aware.

## 2013-10-11 NOTE — Discharge Instructions (Signed)
Recommend Tylenol for pain control. You may take 650 mg every 6 hours as needed. Apply ice to areas of pain. Followup with your primary care doctor to discuss your subtherapeutic INR level as well as to ensure resolution of your current symptoms. Return if symptoms worsen.  Contusion A contusion is a deep bruise. Contusions are the result of an injury that caused bleeding under the skin. The contusion may turn blue, purple, or yellow. Minor injuries will give you a painless contusion, but more severe contusions may stay painful and swollen for a few weeks.  CAUSES  A contusion is usually caused by a blow, trauma, or direct force to an area of the body. SYMPTOMS   Swelling and redness of the injured area.  Bruising of the injured area.  Tenderness and soreness of the injured area.  Pain. DIAGNOSIS  The diagnosis can be made by taking a history and physical exam. An X-ray, CT scan, or MRI may be needed to determine if there were any associated injuries, such as fractures. TREATMENT  Specific treatment will depend on what area of the body was injured. In general, the best treatment for a contusion is resting, icing, elevating, and applying cold compresses to the injured area. Over-the-counter medicines may also be recommended for pain control. Ask your caregiver what the best treatment is for your contusion. HOME CARE INSTRUCTIONS   Put ice on the injured area.  Put ice in a plastic bag.  Place a towel between your skin and the bag.  Leave the ice on for 15-20 minutes, 03-04 times a day.  Only take over-the-counter or prescription medicines for pain, discomfort, or fever as directed by your caregiver. Your caregiver may recommend avoiding anti-inflammatory medicines (aspirin, ibuprofen, and naproxen) for 48 hours because these medicines may increase bruising.  Rest the injured area.  If possible, elevate the injured area to reduce swelling. SEEK IMMEDIATE MEDICAL CARE IF:   You have  increased bruising or swelling.  You have pain that is getting worse.  Your swelling or pain is not relieved with medicines. MAKE SURE YOU:   Understand these instructions.  Will watch your condition.  Will get help right away if you are not doing well or get worse. Document Released: 03/19/2005 Document Revised: 09/01/2011 Document Reviewed: 04/14/2011 [facility] Patient Information 2014 [place], [place].

## 2013-10-11 NOTE — ED Notes (Signed)
Pt. lost her balanced and fell this evening on her left side , no LOC / alert and oriented , ambulatory using her cane ,reports mild pain at left ankle , left knee and left hip.

## 2013-10-11 NOTE — ED Provider Notes (Signed)
Medical screening examination/treatment/procedure(s) were conducted as a shared visit with non-physician practitioner(s) and myself.  I personally evaluated the patient during the encounter.  Pt lost balance and fell on left side.  On coumadin but INR is subtherapeutic.  No acute fractures noted on xrays.  Able to ambulate in the ED.   At this time there does not appear to be any evidence of an acute emergency medical condition and the patient appears stable for discharge with appropriate outpatient follow up.     Kathalene Frames, MD 10/14/13 [phone]

## 2013-10-12 NOTE — Care Management Note (Signed)
Received call this morning from PA in emergency dept requesting rolling walker for patient.  Attempt to call pt and family members several times throughout the day, without success; left message on home voicemail.   [place] to speak with son, Rodman Key, via cell phone:  He states he was able to obtain a rolling walker for patient from a family member who didn't need it anymore.  He was appreciative of my call.   Son denies any other needs.   Will sign off.    Lionel December, RN, BSN Phone [phone]

## 2013-11-03 ENCOUNTER — Ambulatory Visit (INDEPENDENT_AMBULATORY_CARE_PROVIDER_SITE_OTHER): Payer: Medicare Other | Admitting: Cardiology

## 2013-11-03 ENCOUNTER — Telehealth: Payer: Self-pay | Admitting: Cardiology

## 2013-11-03 ENCOUNTER — Other Ambulatory Visit: Payer: Self-pay | Admitting: General Surgery

## 2013-11-03 ENCOUNTER — Encounter: Payer: Self-pay | Admitting: Cardiology

## 2013-11-03 VITALS — BP 134/62 | HR 60 | Ht 62.0 in | Wt 168.0 lb

## 2013-11-03 DIAGNOSIS — I272 Pulmonary hypertension, unspecified: Secondary | ICD-10-CM | POA: Insufficient documentation

## 2013-11-03 DIAGNOSIS — I10 Essential (primary) hypertension: Secondary | ICD-10-CM | POA: Diagnosis not present

## 2013-11-03 DIAGNOSIS — I4891 Unspecified atrial fibrillation: Secondary | ICD-10-CM

## 2013-11-03 DIAGNOSIS — I4819 Other persistent atrial fibrillation: Secondary | ICD-10-CM | POA: Insufficient documentation

## 2013-11-03 DIAGNOSIS — I050 Rheumatic mitral stenosis: Secondary | ICD-10-CM | POA: Diagnosis not present

## 2013-11-03 DIAGNOSIS — I480 Paroxysmal atrial fibrillation: Secondary | ICD-10-CM

## 2013-11-03 DIAGNOSIS — I509 Heart failure, unspecified: Secondary | ICD-10-CM

## 2013-11-03 DIAGNOSIS — I5032 Chronic diastolic (congestive) heart failure: Secondary | ICD-10-CM | POA: Diagnosis not present

## 2013-11-03 MED ORDER — METOPROLOL TARTRATE 25 MG PO TABS: 25.0000 mg | ORAL_TABLET | Freq: Two times a day (BID) | ORAL | Status: DC

## 2013-11-03 NOTE — Patient Instructions (Addendum)
Your physician recommends that you continue on your current medications as directed. Please refer to the Current Medication list given to you today.  You have been referred to our coumadin clinic. Please schedule an appointment for early next week per Jeremy our [place] wants you to follow-up in: 6 months with Dr Mallie Snooks will receive a reminder letter in the mail two months in advance. If you don't receive a letter, please call our office to schedule the follow-up appointment.

## 2013-11-03 NOTE — Telephone Encounter (Signed)
LVM for pt to return call

## 2013-11-03 NOTE — Telephone Encounter (Signed)
New message ° ° ° ° ° °Returning Ana [name]'s call °

## 2013-11-03 NOTE — Telephone Encounter (Signed)
Records received and reviewed.  Patient had normal LVF with mild MS and moderate to severe pulmonary HTN by echo 1 year ago.  Please repeat echo to reassess pulmonary HTN

## 2013-11-03 NOTE — Telephone Encounter (Signed)
New Message:  Cherlyn Cushing is calling to speak with Danielle. She wants to know if Andee Poles received her fax from [facility] in [place].

## 2013-11-03 NOTE — Telephone Encounter (Signed)
Called back and made her aware we did receive faxed paperwork.

## 2013-11-03 NOTE — Progress Notes (Addendum)
[address], [place], [place]  [address] Phone: [phone] Fax:  [phone]  Date:  11/03/2013   ID:  Ana [name], DOB 26-Mar-1932, MRN [id number]  PCP:  Pcp Not In System  Cardiologist:  Fransico Him, MD     History of Present Illness: Ana [name] is a 80 y.o. female with a history of paroxysmal atrial fibrillation, CHF and DM who presents today to establish a new Cardiologist.  She had been followed in the past at [facility].  She was admitted a year ago with CHF and was found to be in atrial fibrillation which eventually resolved.  Since then she has not had any problems and has lost 60 pounds.  She has never had any chest pain or pressure.  She has not had any further SOB, DOE, PND or orthopnea.  She denies any palpitations, dizziness or syncope.  She occasionally has some LE edema.   Wt Readings from Last 3 Encounters:  11/03/13 168 lb (76.204 kg)  10/11/13 165 lb 1 oz (74.872 kg)     Past Medical History  Diagnosis Date  . Diabetes mellitus without complication   . PAF (paroxysmal atrial fibrillation)   . Hypertension   . Mitral stenosis echo 09/2012    mild MS, mild LVF, grade II diastolic dysfunction, moderate to severe pulmonary HTN, normal LVF  . Chronic diastolic CHF (congestive heart failure)   . Pulmonary HTN     moderate to severe by echo 2014  . Hypothyroidism   . Anemia of chronic disease   . Hyperlipidemia   . Vitamin D deficiency disease     Current Outpatient Prescriptions  Medication Sig Dispense Refill  . aspirin 81 MG tablet Take 81 mg by mouth daily.      . Cholecalciferol (VITAMIN D3) 2000 UNITS TABS Take 1 tablet by mouth daily.      . Coenzyme Q10 (CO Q 10) 10 MG CAPS Take by mouth daily.      Marland Kitchen losartan (COZAAR) 100 MG tablet Take 100 mg by mouth daily.      . metFORMIN (GLUCOPHAGE) 1000 MG tablet Take 1,000 mg by mouth 2 (two) times daily with a meal.      . metoprolol (LOPRESSOR) 25 MG tablet Take 1 tablet (25 mg total) by  mouth 2 (two) times daily.      . Omega-3 Fatty Acids (OMEGA 3 PO) Take by mouth daily.      . Omega-3 Krill Oil 500 MG CAPS Take 1 capsule by mouth daily.      Marland Kitchen warfarin (COUMADIN) 2.5 MG tablet Take 1.25-2.5 mg by mouth See admin instructions. Take 0.5 tablet Thursday and Sunday. Take 1 tablet all other days.       No current facility-administered medications for this visit.    Allergies:    Allergies  Allergen Reactions  . Crestor [Rosuvastatin]   . Welchol [Colesevelam Hcl] Nausea Only  . Zetia [Ezetimibe]     Edema   . Zocor [Simvastatin]     Muscle weakness    Social History:  The patient  reports that she has never smoked. She does not have any smokeless tobacco history on file. She reports that she does not drink alcohol or use illicit drugs.   Family History:  The patient's family history includes Cancer in her mother; Cirrhosis in her father; Diabetes in her brother; Heart attack in her sister; Heart disease in her sister.   ROS:  Please see the  history of present illness.      All other systems reviewed and negative.   PHYSICAL EXAM: VS:  BP 134/62  Pulse 60  Ht 5\' 2"  (1.575 m)  Wt 168 lb (76.204 kg)  BMI 30.72 kg/m2 Well nourished, well developed, in no acute distress HEENT: normal Neck: no JVD Cardiac:  normal S1, S2; RRR; 1/6 SM at RUSB Lungs:  clear to auscultation bilaterally, no wheezing, rhonchi or rales Abd: soft, nontender, no hepatomegaly Ext: no edema Skin: warm and dry Neuro:  CNs 2-12 intact, no focal abnormalities noted  EKG:  NSR at 60bpm with PAC's and nonspecific T wave abnormality  ASSESSMENT AND PLAN: 1.  PAF - maintaining NSR - CHADS2VASC score is 6 - continue warfarin/metoprolol  - would not change to NOAC due to valvular heart disease with mild MS 2.  Chronic diastolic CHF - appears euvolemic - continue beta blocker - she had been on Lasix and stopped it when her husband got sick.  At this time will not restart unless she becomes  volume overloaded 3.  DM 4.  HTN well controlled - continue losartan/metoprolol 5.  Mild mitral stenosis 6.  Moderate to severe pulmonary HTN 1 year ago - repeat 2D echo to reassess  Followup with me in 6 months  Signed, Fransico Him, MD 11/03/2013 12:51 PM

## 2013-11-03 NOTE — Addendum Note (Signed)
Addended by: Lily Kocher on: 11/03/2013 10:32 AM   Modules accepted: Orders

## 2013-11-03 NOTE — Telephone Encounter (Signed)
Spoke with son.

## 2013-11-03 NOTE — Telephone Encounter (Signed)
Spoke to son and made aware of ECHO.

## 2013-11-07 ENCOUNTER — Ambulatory Visit: Payer: Self-pay | Admitting: Cardiology

## 2013-11-10 ENCOUNTER — Ambulatory Visit (INDEPENDENT_AMBULATORY_CARE_PROVIDER_SITE_OTHER): Payer: Medicare Other | Admitting: Pharmacist Clinician (PhC)/ Clinical Pharmacy Specialist

## 2013-11-10 DIAGNOSIS — I480 Paroxysmal atrial fibrillation: Secondary | ICD-10-CM

## 2013-11-10 DIAGNOSIS — I2789 Other specified pulmonary heart diseases: Secondary | ICD-10-CM

## 2013-11-10 DIAGNOSIS — Z7901 Long term (current) use of anticoagulants: Secondary | ICD-10-CM | POA: Insufficient documentation

## 2013-11-10 DIAGNOSIS — I4891 Unspecified atrial fibrillation: Secondary | ICD-10-CM

## 2013-11-10 DIAGNOSIS — I272 Pulmonary hypertension, unspecified: Secondary | ICD-10-CM

## 2013-11-10 LAB — POCT INR: INR: 1.7

## 2013-11-22 ENCOUNTER — Other Ambulatory Visit (HOSPITAL_COMMUNITY): Payer: 59

## 2013-12-06 ENCOUNTER — Other Ambulatory Visit (HOSPITAL_COMMUNITY): Payer: Self-pay | Admitting: Radiology

## 2013-12-06 ENCOUNTER — Ambulatory Visit (INDEPENDENT_AMBULATORY_CARE_PROVIDER_SITE_OTHER): Payer: Medicare Other | Admitting: Pharmacist

## 2013-12-06 ENCOUNTER — Ambulatory Visit (HOSPITAL_COMMUNITY): Payer: Medicare Other | Attending: Cardiology | Admitting: Radiology

## 2013-12-06 DIAGNOSIS — I480 Paroxysmal atrial fibrillation: Secondary | ICD-10-CM

## 2013-12-06 DIAGNOSIS — R0602 Shortness of breath: Secondary | ICD-10-CM | POA: Diagnosis not present

## 2013-12-06 DIAGNOSIS — I2789 Other specified pulmonary heart diseases: Secondary | ICD-10-CM

## 2013-12-06 DIAGNOSIS — I10 Essential (primary) hypertension: Secondary | ICD-10-CM | POA: Insufficient documentation

## 2013-12-06 DIAGNOSIS — I270 Primary pulmonary hypertension: Secondary | ICD-10-CM | POA: Insufficient documentation

## 2013-12-06 DIAGNOSIS — I272 Pulmonary hypertension, unspecified: Secondary | ICD-10-CM

## 2013-12-06 DIAGNOSIS — I509 Heart failure, unspecified: Secondary | ICD-10-CM | POA: Diagnosis not present

## 2013-12-06 DIAGNOSIS — I4891 Unspecified atrial fibrillation: Secondary | ICD-10-CM

## 2013-12-06 DIAGNOSIS — Z7901 Long term (current) use of anticoagulants: Secondary | ICD-10-CM | POA: Diagnosis not present

## 2013-12-06 DIAGNOSIS — E785 Hyperlipidemia, unspecified: Secondary | ICD-10-CM | POA: Diagnosis not present

## 2013-12-06 DIAGNOSIS — E119 Type 2 diabetes mellitus without complications: Secondary | ICD-10-CM | POA: Insufficient documentation

## 2013-12-06 LAB — POCT INR: INR: 2

## 2013-12-06 NOTE — Progress Notes (Signed)
Echocardiogram performed.  

## 2014-01-06 ENCOUNTER — Other Ambulatory Visit: Payer: Self-pay

## 2014-01-06 MED ORDER — LOSARTAN POTASSIUM 100 MG PO TABS: 100.0000 mg | ORAL_TABLET | Freq: Every day | ORAL | Status: DC

## 2014-01-14 ENCOUNTER — Inpatient Hospital Stay (HOSPITAL_COMMUNITY)
Admission: EM | Admit: 2014-01-14 | Discharge: 2014-01-20 | DRG: 248 | Disposition: A | Discharge status: Home / Self Care | Payer: Medicare Other | Attending: Cardiology | Admitting: Cardiology

## 2014-01-14 ENCOUNTER — Emergency Department (HOSPITAL_COMMUNITY): Payer: Medicare Other

## 2014-01-14 ENCOUNTER — Encounter (HOSPITAL_COMMUNITY): Payer: Self-pay | Admitting: Emergency Medicine

## 2014-01-14 DIAGNOSIS — I509 Heart failure, unspecified: Secondary | ICD-10-CM | POA: Diagnosis not present

## 2014-01-14 DIAGNOSIS — I4891 Unspecified atrial fibrillation: Secondary | ICD-10-CM | POA: Diagnosis present

## 2014-01-14 DIAGNOSIS — I2789 Other specified pulmonary heart diseases: Secondary | ICD-10-CM | POA: Diagnosis present

## 2014-01-14 DIAGNOSIS — I4819 Other persistent atrial fibrillation: Secondary | ICD-10-CM | POA: Diagnosis present

## 2014-01-14 DIAGNOSIS — Z8249 Family history of ischemic heart disease and other diseases of the circulatory system: Secondary | ICD-10-CM

## 2014-01-14 DIAGNOSIS — I079 Rheumatic tricuspid valve disease, unspecified: Secondary | ICD-10-CM | POA: Diagnosis present

## 2014-01-14 DIAGNOSIS — J9819 Other pulmonary collapse: Secondary | ICD-10-CM | POA: Diagnosis not present

## 2014-01-14 DIAGNOSIS — I2510 Atherosclerotic heart disease of native coronary artery without angina pectoris: Secondary | ICD-10-CM | POA: Diagnosis present

## 2014-01-14 DIAGNOSIS — I059 Rheumatic mitral valve disease, unspecified: Secondary | ICD-10-CM | POA: Diagnosis present

## 2014-01-14 DIAGNOSIS — N390 Urinary tract infection, site not specified: Secondary | ICD-10-CM | POA: Diagnosis not present

## 2014-01-14 DIAGNOSIS — N3000 Acute cystitis without hematuria: Secondary | ICD-10-CM | POA: Diagnosis not present

## 2014-01-14 DIAGNOSIS — A498 Other bacterial infections of unspecified site: Secondary | ICD-10-CM | POA: Diagnosis not present

## 2014-01-14 DIAGNOSIS — Z888 Allergy status to other drugs, medicaments and biological substances status: Secondary | ICD-10-CM | POA: Diagnosis not present

## 2014-01-14 DIAGNOSIS — D638 Anemia in other chronic diseases classified elsewhere: Secondary | ICD-10-CM | POA: Diagnosis present

## 2014-01-14 DIAGNOSIS — I25119 Atherosclerotic heart disease of native coronary artery with unspecified angina pectoris: Secondary | ICD-10-CM

## 2014-01-14 DIAGNOSIS — E785 Hyperlipidemia, unspecified: Secondary | ICD-10-CM | POA: Diagnosis present

## 2014-01-14 DIAGNOSIS — I2582 Chronic total occlusion of coronary artery: Secondary | ICD-10-CM | POA: Diagnosis not present

## 2014-01-14 DIAGNOSIS — Z7901 Long term (current) use of anticoagulants: Secondary | ICD-10-CM

## 2014-01-14 DIAGNOSIS — Z7902 Long term (current) use of antithrombotics/antiplatelets: Secondary | ICD-10-CM

## 2014-01-14 DIAGNOSIS — R0609 Other forms of dyspnea: Secondary | ICD-10-CM | POA: Diagnosis not present

## 2014-01-14 DIAGNOSIS — E119 Type 2 diabetes mellitus without complications: Secondary | ICD-10-CM | POA: Diagnosis present

## 2014-01-14 DIAGNOSIS — I214 Non-ST elevation (NSTEMI) myocardial infarction: Principal | ICD-10-CM

## 2014-01-14 DIAGNOSIS — E039 Hypothyroidism, unspecified: Secondary | ICD-10-CM | POA: Diagnosis present

## 2014-01-14 DIAGNOSIS — Z833 Family history of diabetes mellitus: Secondary | ICD-10-CM

## 2014-01-14 DIAGNOSIS — E559 Vitamin D deficiency, unspecified: Secondary | ICD-10-CM | POA: Diagnosis present

## 2014-01-14 DIAGNOSIS — Z7982 Long term (current) use of aspirin: Secondary | ICD-10-CM

## 2014-01-14 DIAGNOSIS — R109 Unspecified abdominal pain: Secondary | ICD-10-CM | POA: Diagnosis not present

## 2014-01-14 DIAGNOSIS — Z9849 Cataract extraction status, unspecified eye: Secondary | ICD-10-CM

## 2014-01-14 DIAGNOSIS — R0602 Shortness of breath: Secondary | ICD-10-CM | POA: Diagnosis not present

## 2014-01-14 DIAGNOSIS — I5033 Acute on chronic diastolic (congestive) heart failure: Secondary | ICD-10-CM | POA: Diagnosis not present

## 2014-01-14 DIAGNOSIS — I10 Essential (primary) hypertension: Secondary | ICD-10-CM

## 2014-01-14 DIAGNOSIS — I272 Pulmonary hypertension, unspecified: Secondary | ICD-10-CM | POA: Diagnosis present

## 2014-01-14 DIAGNOSIS — J90 Pleural effusion, not elsewhere classified: Secondary | ICD-10-CM | POA: Diagnosis not present

## 2014-01-14 DIAGNOSIS — Z79899 Other long term (current) drug therapy: Secondary | ICD-10-CM | POA: Diagnosis not present

## 2014-01-14 DIAGNOSIS — R0989 Other specified symptoms and signs involving the circulatory and respiratory systems: Secondary | ICD-10-CM | POA: Diagnosis not present

## 2014-01-14 DIAGNOSIS — I050 Rheumatic mitral stenosis: Secondary | ICD-10-CM | POA: Diagnosis present

## 2014-01-14 DIAGNOSIS — I480 Paroxysmal atrial fibrillation: Secondary | ICD-10-CM

## 2014-01-14 DIAGNOSIS — I5032 Chronic diastolic (congestive) heart failure: Secondary | ICD-10-CM | POA: Diagnosis present

## 2014-01-14 LAB — CBC WITH DIFFERENTIAL/PLATELET
BASOS PCT: 0 % (ref 0–1)
Basophils Absolute: 0 10*3/uL (ref 0.0–0.1)
EOS ABS: 0.1 10*3/uL (ref 0.0–0.7)
EOS PCT: 2 % (ref 0–5)
HEMATOCRIT: 34.9 % — AB (ref 36.0–46.0)
HEMOGLOBIN: 11.4 g/dL — AB (ref 12.0–15.0)
LYMPHS ABS: 1.9 10*3/uL (ref 0.7–4.0)
Lymphocytes Relative: 27 % (ref 12–46)
MCH: 28.8 pg (ref 26.0–34.0)
MCHC: 32.7 g/dL (ref 30.0–36.0)
MCV: 88.1 fL (ref 78.0–100.0)
MONO ABS: 0.7 10*3/uL (ref 0.1–1.0)
Monocytes Relative: 10 % (ref 3–12)
NEUTROS PCT: 61 % (ref 43–77)
Neutro Abs: 4.3 10*3/uL (ref 1.7–7.7)
Platelets: 251 10*3/uL (ref 150–400)
RBC: 3.96 MIL/uL (ref 3.87–5.11)
RDW: 13.8 % (ref 11.5–15.5)
WBC: 7 10*3/uL (ref 4.0–10.5)

## 2014-01-14 LAB — URINALYSIS, ROUTINE W REFLEX MICROSCOPIC
BILIRUBIN URINE: NEGATIVE
Glucose, UA: NEGATIVE mg/dL
KETONES UR: NEGATIVE mg/dL
Nitrite: NEGATIVE
PH: 7 (ref 5.0–8.0)
Protein, ur: NEGATIVE mg/dL
Specific Gravity, Urine: 1.03 (ref 1.005–1.030)
UROBILINOGEN UA: 0.2 mg/dL (ref 0.0–1.0)

## 2014-01-14 LAB — COMPREHENSIVE METABOLIC PANEL
ALBUMIN: 3.3 g/dL — AB (ref 3.5–5.2)
ALBUMIN: 3.6 g/dL (ref 3.5–5.2)
ALK PHOS: 76 U/L (ref 39–117)
ALT: 13 U/L (ref 0–35)
ALT: 15 U/L (ref 0–35)
ANION GAP: 16 — AB (ref 5–15)
AST: 17 U/L (ref 0–37)
AST: 24 U/L (ref 0–37)
Alkaline Phosphatase: 82 U/L (ref 39–117)
Anion gap: 13 (ref 5–15)
BUN: 15 mg/dL (ref 6–23)
BUN: 12 mg/dL (ref 6–23)
CALCIUM: 9.4 mg/dL (ref 8.4–10.5)
CO2: 21 mEq/L (ref 19–32)
CO2: 28 meq/L (ref 19–32)
CREATININE: 0.75 mg/dL (ref 0.50–1.10)
CREATININE: 0.81 mg/dL (ref 0.50–1.10)
Calcium: 9.2 mg/dL (ref 8.4–10.5)
Chloride: 101 mEq/L (ref 96–112)
Chloride: 97 mEq/L (ref 96–112)
GFR calc Af Amer: 78 mL/min — ABNORMAL LOW (ref >90)
GFR calc non Af Amer: 78 mL/min — ABNORMAL LOW (ref >90)
GFR, EST NON AFRICAN AMERICAN: 67 mL/min — AB (ref >90)
GLUCOSE: 190 mg/dL — AB (ref 70–99)
Glucose, Bld: 132 mg/dL — ABNORMAL HIGH (ref 70–99)
POTASSIUM: 4.9 meq/L (ref 3.7–5.3)
Potassium: 4.2 mEq/L (ref 3.7–5.3)
Sodium: 138 mEq/L (ref 137–147)
Sodium: 138 mEq/L (ref 137–147)
TOTAL PROTEIN: 7.1 g/dL (ref 6.0–8.3)
Total Bilirubin: 0.4 mg/dL (ref 0.3–1.2)
Total Bilirubin: 0.7 mg/dL (ref 0.3–1.2)
Total Protein: 7.5 g/dL (ref 6.0–8.3)

## 2014-01-14 LAB — URINE MICROSCOPIC-ADD ON

## 2014-01-14 LAB — TROPONIN I
TROPONIN I: 0.48 ng/mL — AB (ref <0.30)
Troponin I: 4.89 ng/mL (ref <0.30)
Troponin I: 4.01 ng/mL (ref <0.30)

## 2014-01-14 LAB — GLUCOSE, CAPILLARY
Glucose-Capillary: 115 mg/dL — ABNORMAL HIGH (ref 70–99)
Glucose-Capillary: 192 mg/dL — ABNORMAL HIGH (ref 70–99)

## 2014-01-14 LAB — MRSA PCR SCREENING: MRSA BY PCR: NEGATIVE

## 2014-01-14 LAB — PROTIME-INR
INR: 2.38 — ABNORMAL HIGH (ref 0.00–1.49)
INR: 1.77 — ABNORMAL HIGH (ref 0.00–1.49)
PROTHROMBIN TIME: 26 s — AB (ref 11.6–15.2)
PROTHROMBIN TIME: 20.6 s — AB (ref 11.6–15.2)

## 2014-01-14 LAB — PRO B NATRIURETIC PEPTIDE: PRO B NATRI PEPTIDE: 3027 pg/mL — AB (ref 0–450)

## 2014-01-14 LAB — MAGNESIUM: MAGNESIUM: 1.6 mg/dL (ref 1.5–2.5)

## 2014-01-14 LAB — TSH: TSH: 2.49 u[IU]/mL (ref 0.350–4.500)

## 2014-01-14 LAB — HEMOGLOBIN A1C
HEMOGLOBIN A1C: 7.5 % — AB (ref <5.7)
Mean Plasma Glucose: 169 mg/dL — ABNORMAL HIGH (ref <117)

## 2014-01-14 MED ORDER — HEPARIN (PORCINE) IN NACL 100-0.45 UNIT/ML-% IJ SOLN
1100.0000 [IU]/h | INTRAMUSCULAR | Status: DC
  Administered 2014-01-14: 900 [IU]/h via INTRAVENOUS
  Administered 2014-01-15: 1100 [IU]/h via INTRAVENOUS
  Filled 2014-01-14 (×2): qty 250

## 2014-01-14 MED ORDER — METOPROLOL TARTRATE 25 MG PO TABS
25.0000 mg | ORAL_TABLET | Freq: Two times a day (BID) | ORAL | Status: DC
  Administered 2014-01-14 – 2014-01-20 (×12): 25 mg via ORAL
  Filled 2014-01-14 (×14): qty 1

## 2014-01-14 MED ORDER — WARFARIN 1.25 MG HALF TABLET: 1.2500 mg | ORAL_TABLET | ORAL | Status: DC

## 2014-01-14 MED ORDER — WARFARIN SODIUM 2.5 MG PO TABS
2.5000 mg | ORAL_TABLET | ORAL | Status: DC
  Filled 2014-01-14: qty 1

## 2014-01-14 MED ORDER — WARFARIN SODIUM 2.5 MG PO TABS
2.5000 mg | ORAL_TABLET | ORAL | Status: AC
Start: 2014-01-14 — End: 2014-01-14
  Administered 2014-01-14: 2.5 mg via ORAL
  Filled 2014-01-14: qty 1

## 2014-01-14 MED ORDER — WARFARIN - PHARMACIST DOSING INPATIENT: Freq: Every day | Status: DC

## 2014-01-14 MED ORDER — ASPIRIN 300 MG RE SUPP
300.0000 mg | RECTAL | Status: AC
  Filled 2014-01-14: qty 1

## 2014-01-14 MED ORDER — ASPIRIN 81 MG PO TABS: 81.0000 mg | ORAL_TABLET | Freq: Every day | ORAL | Status: DC

## 2014-01-14 MED ORDER — ASPIRIN EC 81 MG PO TBEC
81.0000 mg | DELAYED_RELEASE_TABLET | Freq: Every day | ORAL | Status: DC
  Administered 2014-01-16 – 2014-01-17 (×2): 81 mg via ORAL
  Filled 2014-01-14 (×4): qty 1

## 2014-01-14 MED ORDER — INSULIN ASPART 100 UNIT/ML ~~LOC~~ SOLN
0.0000 [IU] | Freq: Three times a day (TID) | SUBCUTANEOUS | Status: DC
  Administered 2014-01-15 (×2): 3 [IU] via SUBCUTANEOUS
  Administered 2014-01-15: 8 [IU] via SUBCUTANEOUS
  Administered 2014-01-16: 3 [IU] via SUBCUTANEOUS
  Administered 2014-01-16: 5 [IU] via SUBCUTANEOUS
  Administered 2014-01-18 (×2): 3 [IU] via SUBCUTANEOUS
  Administered 2014-01-18 – 2014-01-19 (×2): 2 [IU] via SUBCUTANEOUS
  Administered 2014-01-19 – 2014-01-20 (×2): 3 [IU] via SUBCUTANEOUS
  Administered 2014-01-20: 2 [IU] via SUBCUTANEOUS

## 2014-01-14 MED ORDER — ONDANSETRON HCL 4 MG/2ML IJ SOLN
4.0000 mg | Freq: Four times a day (QID) | INTRAMUSCULAR | Status: DC | PRN
  Administered 2014-01-17: 4 mg via INTRAVENOUS
  Filled 2014-01-14: qty 2

## 2014-01-14 MED ORDER — BIOTENE DRY MOUTH MT LIQD
15.0000 mL | Freq: Two times a day (BID) | OROMUCOSAL | Status: DC
  Administered 2014-01-14 – 2014-01-20 (×9): 15 mL via OROMUCOSAL

## 2014-01-14 MED ORDER — ASPIRIN 81 MG PO CHEW
324.0000 mg | CHEWABLE_TABLET | ORAL | Status: AC
  Administered 2014-01-14: 324 mg via ORAL
  Filled 2014-01-14: qty 4

## 2014-01-14 MED ORDER — WARFARIN - PHYSICIAN DOSING INPATIENT: Freq: Every day | Status: DC

## 2014-01-14 MED ORDER — FUROSEMIDE 10 MG/ML IJ SOLN
40.0000 mg | Freq: Two times a day (BID) | INTRAMUSCULAR | Status: DC
  Administered 2014-01-14 – 2014-01-15 (×2): 40 mg via INTRAVENOUS
  Filled 2014-01-14 (×4): qty 4

## 2014-01-14 MED ORDER — NITROGLYCERIN 0.4 MG SL SUBL: 0.4000 mg | SUBLINGUAL_TABLET | SUBLINGUAL | Status: DC | PRN

## 2014-01-14 MED ORDER — LOSARTAN POTASSIUM 50 MG PO TABS
100.0000 mg | ORAL_TABLET | Freq: Every day | ORAL | Status: DC
  Administered 2014-01-16 – 2014-01-20 (×5): 100 mg via ORAL
  Filled 2014-01-14 (×6): qty 2

## 2014-01-14 MED ORDER — IOHEXOL 350 MG/ML SOLN
100.0000 mL | Freq: Once | INTRAVENOUS | Status: AC | PRN
  Administered 2014-01-14: 100 mL via INTRAVENOUS

## 2014-01-14 MED ORDER — ACETAMINOPHEN 325 MG PO TABS: 650.0000 mg | ORAL_TABLET | ORAL | Status: DC | PRN

## 2014-01-14 NOTE — H&P (Addendum)
Patient ID: Ana [name] MRN: [id number], DOB/AGE: Nov 15, 1931   Admit date: 01/14/2014   Primary Physician: Pcp Not In System Primary Cardiologist: Dr. Fransico Him  Pt. Profile:  80 year old woman with past history of chronic diastolic heart failure presents with increasing dyspnea.  Problem List  Past Medical History  Diagnosis Date  . Diabetes mellitus without complication   . PAF (paroxysmal atrial fibrillation)   . Hypertension   . Mitral stenosis echo 09/2012    mild MS, mild LVF, grade II diastolic dysfunction, moderate to severe pulmonary HTN, normal LVF  . Chronic diastolic CHF (congestive heart failure)   . Pulmonary HTN     moderate to severe by echo 2014  . Hypothyroidism   . Anemia of chronic disease   . Hyperlipidemia   . Vitamin D deficiency disease     Past Surgical History  Procedure Laterality Date  . Cataract extraction       Allergies  Allergies  Allergen Reactions  . Welchol [Colesevelam Hcl] Nausea Only  . Zetia [Ezetimibe] Other (See Comments)    Edema   . Crestor [Rosuvastatin] Other (See Comments)    Unknown  . Zocor [Simvastatin] Other (See Comments)    Muscle weakness    HPI  This 80 year old woman recently moved here from [facility].  Dr. Fransico Him recently assumed her cardiology care.  The patient has a past history of diastolic congestive heart failure and was hospitalized last year.  She has a history of mild mitral stenosis.  Mild mitral regurgitation.  There is a past history of paroxysmal atrial fibrillation and is on long-term Coumadin.  Her INR today is therapeutic.  In the past she has been on Lasix but has not taken any Lasix recently.  This morning she awakened from sleep and went to the bathroom.  She felt significant dyspnea and some discomfort across her intrascapular area.  She came to the emergency room where her chest x-ray shows cardiomegaly and early congestive heart failure.  Her B. natruretic  peptide is elevated consistent with CHF.  Her initial troponin is elevated.  Her initial troponin is elevated at 0.48.  She does not have any history of ischemic heart disease.  No prior history of angina pectoris.. EKG today shows new nonspecific ST-T wave changes in leads 3 and aVL.  Home Medications  Prior to Admission medications   Medication Sig Start Date End Date Taking? Authorizing Provider  aspirin 81 MG tablet Take 81 mg by mouth daily.   Yes Historical Provider, MD  Cholecalciferol (VITAMIN D3) 2000 UNITS TABS Take 2,000 Units by mouth daily.    Yes Historical Provider, MD  Coenzyme Q10 (CO Q 10) 10 MG CAPS Take 10 mg by mouth daily.    Yes Historical Provider, MD  losartan (COZAAR) 100 MG tablet Take 1 tablet (100 mg total) by mouth daily. 01/06/14  Yes Sueanne Margarita, MD  metFORMIN (GLUCOPHAGE) 1000 MG tablet Take 1,000 mg by mouth 2 (two) times daily with a meal.   Yes Historical Provider, MD  metoprolol (LOPRESSOR) 25 MG tablet Take 1 tablet (25 mg total) by mouth 2 (two) times daily. 11/03/13  Yes Sueanne Margarita, MD  Omega-3 Krill Oil 500 MG CAPS Take 1 capsule by mouth daily.   Yes Historical Provider, MD  warfarin (COUMADIN) 2.5 MG tablet Take 1.25-2.5 mg by mouth See admin instructions. Take 1.25mg  by mouth on Sunday. Take 2.5mg  by mouth on Monday, Tuesday, Wednesday, Thursday, Friday and  Saturday.   Yes Historical Provider, MD    Family History  Family History  Problem Relation Age of Onset  . Cancer Mother   . Cirrhosis Father   . Heart attack Sister   . Heart disease Sister   . Diabetes Brother     Social History  History   Social History  . Marital Status: Married    Spouse Name: N/A    Number of Children: N/A  . Years of Education: N/A   Occupational History  . Not on file.   Social History Main Topics  . Smoking status: Never Smoker   . Smokeless tobacco: Not on file  . Alcohol Use: No  . Drug Use: No  . Sexual Activity: Not on file   Other Topics  Concern  . Not on file   Social History Narrative  . No narrative on file     Review of Systems General:  No chills, fever, night sweats or weight changes.  Cardiovascular:  No chest pain, dyspnea on exertion, edema, orthopnea, palpitations, paroxysmal nocturnal dyspnea. Dermatological: No rash, lesions/masses Respiratory: No cough, dyspnea Urologic: No hematuria, dysuria Abdominal:   No nausea, vomiting, diarrhea, bright red blood per rectum, melena, or hematemesis Neurologic:  No visual changes, wkns, changes in mental status. All other systems reviewed and are otherwise negative except as noted above.  Physical Exam  Blood pressure 131/77, pulse 86, temperature 98.2 F (36.8 C), temperature source Oral, resp. rate 20, SpO2 98.00%.  General: Pleasant, NAD Psych: Normal affect. Neuro: Alert and oriented X 3. Moves all extremities spontaneously. HEENT: Normal  Neck: Supple without bruits or JVD. Lungs:  Resp regular and unlabored, CTA.  Mild bibasilar rales  Heart: RRR no s3, s4, or murmurs. Abdomen: Soft, non-tender, non-distended, BS + x 4.  Extremities: No clubbing, cyanosis or edema. DP/PT/Radials 2+ and equal bilaterally.  Labs  Troponin (Point of Care Test) No results found for this basename: TROPIPOC,  in the last 72 hours  Recent Labs  01/14/14 0820  TROPONINI 0.48*   Lab Results  Component Value Date   WBC 7.0 01/14/2014   HGB 11.4* 01/14/2014   HCT 34.9* 01/14/2014   MCV 88.1 01/14/2014   PLT 251 01/14/2014     Recent Labs Lab 01/14/14 0810  NA 138  K 4.9  CL 101  CO2 21  BUN 15  CREATININE 0.75  CALCIUM 9.2  PROT 7.1  BILITOT 0.4  ALKPHOS 76  ALT 13  AST 17  GLUCOSE 190*   No results found for this basename: CHOL,  HDL,  LDLCALC,  TRIG   No results found for this basename: DDIMER     Radiology/Studies  Dg Chest 2 View  01/14/2014   CLINICAL DATA:  Shoulder pain, dizziness, shortness of breath  EXAM: CHEST  2 VIEW  COMPARISON:  None.   FINDINGS: Marked cardiomegaly. Diffuse bilateral interstitial opacities with "Peripheral Kerley B-lines ". This could represent chronic interstitial lung disease versus early edema. Minimal basilar atelectasis versus scarring. No significant effusion or pneumothorax. Trachea midline. Degenerative changes of the spine. Bones are osteopenic.  IMPRESSION: Cardiomegaly with interstitial changes, possibly chronic interstitial lung disease versus early edema pattern.  Basilar atelectasis.   Electronically Signed   By: Daryll Brod M.D.   On: 01/14/2014 08:54   Ct Angio Chest Aortic Dissect W &/or W/o  01/14/2014   CLINICAL DATA:  back pain back pain; SHORTNESS OF BREATH SHORTNESS OF BREATH  EXAM: CT ANGIOGRAPHY CHEST, ABDOMEN AND PELVIS  TECHNIQUE: Multidetector CT imaging through the chest, abdomen and pelvis was performed using the standard protocol during bolus administration of intravenous contrast. Multiplanar reconstructed images and MIPs were obtained and reviewed to evaluate the vascular anatomy.  CONTRAST:  123mL OMNIPAQUE IOHEXOL 350 MG/ML SOLN  COMPARISON:  None.  FINDINGS: CTA CHEST FINDINGS  The noncontrast scout shows no hyperdense intramural hematoma or mediastinal hematoma. Trace pericardial effusion. Small bilateral pleural effusions right greater than left. Four-chamber cardiac enlargement.  On CTA, Adequate contrast opacification of the thoracic aorta with no evidence of dissection, aneurysm, or stenosis. There is classic 3-vessel brachiocephalic arch anatomy without proximal stenosis. Small ductus diverticulum. Patchy aortic plaque. Satisfactory opacification of pulmonary arteries noted, and there is no evidence of pulmonary emboli. Mildly enlarged subcarinal lymph nodes. Subcentimeter prevascular, left suprahilar, right paratracheal, and precarinal lymph nodes. No hilar adenopathy.  Review of the MIP images confirms the above findings.  CTA ABDOMEN AND PELVIS FINDINGS  Arterial findings:  Aorta:  Mild calcified atheromatous plaque. No aneurysm, dissection, or stenosis.  Celiac axis: Partially calcified origin plaque resulting in greater than 50% diameter short segment stenosis, patent distally.  Superior mesenteric: Nonocclusive ostial plaque, patent distally, with replaced right hepatic arterial supply, anatomic variant.  Left renal:          Single, patent.  Right renal:         Single, patent.  Inferior mesenteric: Patent.  Left iliac: Mild scattered plaque. No aneurysm or stenosis.  Right iliac: Mild scattered plaque. No aneurysm or stenosis.  Venous findings:     Dedicated venous phase imaging not obtained.  Review of the MIP images confirms the above findings.  Nonvascular findings: Unremarkable liver, gallbladder, spleen, bilateral adrenal glands, kidneys. Diffuse pancreatic parenchymal atrophy. Small hiatal hernia. Stomach, small bowel, colon are decompressed. Uterus and adnexal regions unremarkable. Urinary bladder incompletely distended. Bilateral pelvic phleboliths. No ascites. No free air. No adenopathy localized. Mild spondylitic changes in the lumbar spine.  IMPRESSION: 1. Negative for aortic dissection, aneurysm, or other acute vascular abnormality. 2. Small pleural effusions, cardiomegaly, and patchy ground-glass opacities throughout both lungs suggesting CHF. 3. Subcarinal adenopathy, possibly reactive but nonspecific. 4. Origin stenosis of the celiac axis. Widely patent SMA and IMA likely render this clinically inapparent.   Electronically Signed   By: Arne Cleveland M.D.   On: 01/14/2014 10:32   Ct Cta Abd/pel W/cm &/or W/o Cm  01/14/2014   CLINICAL DATA:  back pain back pain; SHORTNESS OF BREATH SHORTNESS OF BREATH  EXAM: CT ANGIOGRAPHY CHEST, ABDOMEN AND PELVIS  TECHNIQUE: Multidetector CT imaging through the chest, abdomen and pelvis was performed using the standard protocol during bolus administration of intravenous contrast. Multiplanar reconstructed images and MIPs were obtained  and reviewed to evaluate the vascular anatomy.  CONTRAST:  172mL OMNIPAQUE IOHEXOL 350 MG/ML SOLN  COMPARISON:  None.  FINDINGS: CTA CHEST FINDINGS  The noncontrast scout shows no hyperdense intramural hematoma or mediastinal hematoma. Trace pericardial effusion. Small bilateral pleural effusions right greater than left. Four-chamber cardiac enlargement.  On CTA, Adequate contrast opacification of the thoracic aorta with no evidence of dissection, aneurysm, or stenosis. There is classic 3-vessel brachiocephalic arch anatomy without proximal stenosis. Small ductus diverticulum. Patchy aortic plaque. Satisfactory opacification of pulmonary arteries noted, and there is no evidence of pulmonary emboli. Mildly enlarged subcarinal lymph nodes. Subcentimeter prevascular, left suprahilar, right paratracheal, and precarinal lymph nodes. No hilar adenopathy.  Review of the MIP images confirms the above findings.  CTA ABDOMEN AND PELVIS  FINDINGS  Arterial findings:  Aorta: Mild calcified atheromatous plaque. No aneurysm, dissection, or stenosis.  Celiac axis: Partially calcified origin plaque resulting in greater than 50% diameter short segment stenosis, patent distally.  Superior mesenteric: Nonocclusive ostial plaque, patent distally, with replaced right hepatic arterial supply, anatomic variant.  Left renal:          Single, patent.  Right renal:         Single, patent.  Inferior mesenteric: Patent.  Left iliac: Mild scattered plaque. No aneurysm or stenosis.  Right iliac: Mild scattered plaque. No aneurysm or stenosis.  Venous findings:     Dedicated venous phase imaging not obtained.  Review of the MIP images confirms the above findings.  Nonvascular findings: Unremarkable liver, gallbladder, spleen, bilateral adrenal glands, kidneys. Diffuse pancreatic parenchymal atrophy. Small hiatal hernia. Stomach, small bowel, colon are decompressed. Uterus and adnexal regions unremarkable. Urinary bladder incompletely distended.  Bilateral pelvic phleboliths. No ascites. No free air. No adenopathy localized. Mild spondylitic changes in the lumbar spine.  IMPRESSION: 1. Negative for aortic dissection, aneurysm, or other acute vascular abnormality. 2. Small pleural effusions, cardiomegaly, and patchy ground-glass opacities throughout both lungs suggesting CHF. 3. Subcarinal adenopathy, possibly reactive but nonspecific. 4. Origin stenosis of the celiac axis. Widely patent SMA and IMA likely render this clinically inapparent.   Electronically Signed   By: Arne Cleveland M.D.   On: 01/14/2014 10:32    ECG Normal sinus rhythm.  Nonspecific ST-T wave changes increased since prior tracing   2-D echo: On 12/06/13 Study Conclusions  - Left ventricle: The cavity size was normal. There was moderate concentric hypertrophy. Systolic function was normal. The estimated ejection fraction was in the range of 60% to 65%. Wall motion was normal; there were no regional wall motion abnormalities. Features are consistent with a pseudonormal left ventricular filling pattern, with concomitant abnormal relaxation and significantly increased filling pressure (grade 2 diastolic dysfunction). Doppler parameters are consistent with elevated ventricular end-diastolic filling pressure. - Aortic valve: Trileaflet; normal thickness leaflets. There was no regurgitation. - Aortic root: The aortic root was normal in size. - Mitral valve: Calcified annulus predominantly posteriorly. Mildly thickened leaflets . There was mild regurgitation. - Left atrium: The atrium was mildly dilated. - Right ventricle: Systolic function was normal. - Tricuspid valve: There was mild regurgitation.  Impressions:  - Normal left ventricular size and function. Pseudonormal pattern of the diastolic dysfunction with elevated filling pressures. Mild mitral and tricuspid regurgitation. Normal RVSP.  ASSESSMENT AND PLAN  1. acute on chronic diastolic heart failure 2.  elevated troponin probably secondary to type II non-STEMI 3. history of paroxysmal atrial fibrillation on long-term Coumadin, therapeutic. 4.  Diabetes mellitus 5. history of mild mitral valve disease with mitral stenosis and mitral regurgitation. 6. Hypertension 7. statin intolerance  Plan: Admit to telemetry.  Continue Coumadin.  Resume diuretic. Serial EKGs and cycle troponins.  I do not anticipate cardiac catheterization unless troponins continue to rise significantly.   Signed, Darlin Coco, MD  01/14/2014, 12:57 PM  Addendum: Second troponin came back 4.89 suggests that this was a primary coronary event (NSTEMI) which precipitated the heart failure. Will hold further warfarin and will plan for left heart cath/PCI on Monday. Will add IV heparin once INR drops below 2.0. The risks and benefits of a cardiac catheterization including, but not limited to, death, stroke, MI, kidney damage and bleeding were discussed with the patient who indicates understanding and agrees to proceed.

## 2014-01-14 NOTE — ED Notes (Signed)
Dr. Rancour at bedside. 

## 2014-01-14 NOTE — ED Notes (Signed)
Pt reports "feeling like something is not right." Pt had one episode of shoulder blade pain this morning. Reports going to the bathroom this morning and felt dizzy from "lack of oxygen like I couldn't get my breath."

## 2014-01-14 NOTE — Progress Notes (Signed)
ANTICOAGULATION CONSULT NOTE - Initial Consult  Pharmacy Consult for Coumadin Indication: atrial fibrillation  Allergies  Allergen Reactions  . Welchol [Colesevelam Hcl] Nausea Only  . Zetia [Ezetimibe] Other (See Comments)    Edema   . Crestor [Rosuvastatin] Other (See Comments)    Unknown  . Zocor [Simvastatin] Other (See Comments)    Muscle weakness    Patient Measurements:   Heparin Dosing Weight:   Vital Signs: Temp: 98.2 F (36.8 C) (07/25 0801) Temp src: Oral (07/25 0801) BP: 134/60 mmHg (07/25 1240) Pulse Rate: 87 (07/25 1240)  Labs:  Recent Labs  01/14/14 0810 01/14/14 0820  HGB 11.4*  --   HCT 34.9*  --   PLT 251  --   LABPROT 26.0*  --   INR 2.38*  --   CREATININE 0.75  --   TROPONINI  --  0.48*    The CrCl is unknown because both a height and weight (above a minimum accepted value) are required for this calculation.   Medical History: Past Medical History  Diagnosis Date  . Diabetes mellitus without complication   . PAF (paroxysmal atrial fibrillation)   . Hypertension   . Mitral stenosis echo 09/2012    mild MS, mild LVF, grade II diastolic dysfunction, moderate to severe pulmonary HTN, normal LVF  . Chronic diastolic CHF (congestive heart failure)   . Pulmonary HTN     moderate to severe by echo 2014  . Hypothyroidism   . Anemia of chronic disease   . Hyperlipidemia   . Vitamin D deficiency disease     Medications:  Scheduled:  . furosemide  40 mg Intravenous BID    Assessment: 80yo female admitted with acute on chronic diastolic HF, to continue Coumadin for AFib.  INR 2.38, therapeutic on home dose of 2.5mg  daily except for 1.25 on Sundays.  Hg 11.4 and pltc is wnl.  No bleeding noted.  Her last dose was 7/24.  Goal of Therapy:  INR 2-3 Monitor platelets by anticoagulation protocol: Yes   Plan:  1-  Coumadin 2.5mg  daily, except for 1.25mg  on Sundays 2-  Daily INR 3-  Watch for s/s of bleeding  Gracy Bruins, PharmD Clinical  Pharmacist [facility]

## 2014-01-14 NOTE — ED Provider Notes (Signed)
CSN: [id number]     Arrival date & time 01/14/14  6269 History   First MD Initiated Contact with Patient 01/14/14 [id number]     Chief Complaint  Patient presents with  . Shortness of Breath     (Consider location/radiation/quality/duration/timing/severity/associated sxs/prior Treatment) HPI Comments:  Patient presents with difficulty breathing the onset this morning as she was walking back from the bathroom. She felt like she was not getting enough oxygen. She developed a pain between her shoulder blades it lasts a few minutes and is now resolved. No chest pain. No cough or fever. Reports shortness of breath is similar to when she had congestive heart failure last year. She does not take Lasix routinely. She denies any abdominal pain, nausea or vomiting. She denies any leg pain or leg swelling. She feels better with the oxygen in place. Initial oxygenation was 92%. Her upper back pain has resolved. She has no chest pain.  The history is provided by the patient.    Past Medical History  Diagnosis Date  . Diabetes mellitus without complication   . PAF (paroxysmal atrial fibrillation)   . Hypertension   . Mitral stenosis echo 09/2012    mild MS, mild LVF, grade II diastolic dysfunction, moderate to severe pulmonary HTN, normal LVF  . Chronic diastolic CHF (congestive heart failure)   . Pulmonary HTN     moderate to severe by echo 2014  . Hypothyroidism   . Anemia of chronic disease   . Hyperlipidemia   . Vitamin D deficiency disease    Past Surgical History  Procedure Laterality Date  . Cataract extraction     Family History  Problem Relation Age of Onset  . Cancer Mother   . Cirrhosis Father   . Heart attack Sister   . Heart disease Sister   . Diabetes Brother    History  Substance Use Topics  . Smoking status: Never Smoker   . Smokeless tobacco: Not on file  . Alcohol Use: No   OB History   Grav Para Term Preterm Abortions TAB SAB Ect Mult Living                 Review  of Systems  Constitutional: Positive for activity change and appetite change. Negative for fever.  HENT: Negative for congestion and rhinorrhea.   Respiratory: Positive for shortness of breath. Negative for cough and chest tightness.   Cardiovascular: Negative for chest pain and leg swelling.  Gastrointestinal: Negative for nausea, vomiting and abdominal pain.  Genitourinary: Negative for dysuria, hematuria, vaginal bleeding and vaginal discharge.  Musculoskeletal: Negative for arthralgias and neck pain.  Neurological: Negative for dizziness, weakness and headaches.  A complete 10 system review of systems was obtained and all systems are negative except as noted in the HPI and PMH.      Allergies  Welchol; Zetia; Crestor; and Zocor  Home Medications   Prior to Admission medications   Medication Sig Start Date End Date Taking? Authorizing Provider  aspirin 81 MG tablet Take 81 mg by mouth daily.   Yes Historical Provider, MD  Cholecalciferol (VITAMIN D3) 2000 UNITS TABS Take 2,000 Units by mouth daily.    Yes Historical Provider, MD  Coenzyme Q10 (CO Q 10) 10 MG CAPS Take 10 mg by mouth daily.    Yes Historical Provider, MD  losartan (COZAAR) 100 MG tablet Take 1 tablet (100 mg total) by mouth daily. 01/06/14  Yes Sueanne Margarita, MD  metFORMIN (GLUCOPHAGE) 1000 MG tablet Take  1,000 mg by mouth 2 (two) times daily with a meal.   Yes Historical Provider, MD  metoprolol (LOPRESSOR) 25 MG tablet Take 1 tablet (25 mg total) by mouth 2 (two) times daily. 11/03/13  Yes Sueanne Margarita, MD  Omega-3 Krill Oil 500 MG CAPS Take 1 capsule by mouth daily.   Yes Historical Provider, MD  warfarin (COUMADIN) 2.5 MG tablet Take 1.25-2.5 mg by mouth See admin instructions. Take 1.25mg  by mouth on Sunday. Take 2.5mg  by mouth on Monday, Tuesday, Wednesday, Thursday, Friday and Saturday.   Yes Historical Provider, MD   BP 160/58  Pulse 66  Temp(Src) 98.3 F (36.8 C) (Oral)  Resp 11  Ht 5\' 2"  (1.575 m)  Wt  161 lb 6 oz (73.2 kg)  BMI 29.51 kg/m2  SpO2 100% Physical Exam  Nursing note and vitals reviewed. Constitutional: She is oriented to person, place, and time. She appears well-developed and well-nourished. No distress.  HENT:  Head: Normocephalic and atraumatic.  Mouth/Throat: Oropharynx is clear and moist. No oropharyngeal exudate.  Eyes: Conjunctivae and EOM are normal. Pupils are equal, round, and reactive to light.  Neck: Normal range of motion. Neck supple.  No meningismus.  Cardiovascular: Normal rate, regular rhythm, normal heart sounds and intact distal pulses.   No murmur heard. Pulmonary/Chest: Effort normal and breath sounds normal. No respiratory distress.  Decreased breath sounds at bases  Abdominal: Soft. There is no tenderness. There is no rebound and no guarding.  Musculoskeletal: Normal range of motion. She exhibits no edema and no tenderness.  Neurological: She is alert and oriented to person, place, and time. No cranial nerve deficit. She exhibits normal muscle tone. Coordination normal.  No ataxia on finger to nose bilaterally. No pronator drift. 5/5 strength throughout. CN 2-12 intact. Negative Romberg. Equal grip strength. Sensation intact. Gait is normal.   Skin: Skin is warm.  Psychiatric: She has a normal mood and affect. Her behavior is normal.    ED Course  Procedures (including critical care time) Labs Review Labs Reviewed  CBC WITH DIFFERENTIAL - Abnormal; Notable for the following:    Hemoglobin 11.4 (*)    HCT 34.9 (*)    All other components within normal limits  COMPREHENSIVE METABOLIC PANEL - Abnormal; Notable for the following:    Glucose, Bld 190 (*)    Albumin 3.3 (*)    GFR calc non Af Amer 78 (*)    Anion gap 16 (*)    All other components within normal limits  PROTIME-INR - Abnormal; Notable for the following:    Prothrombin Time 26.0 (*)    INR 2.38 (*)    All other components within normal limits  TROPONIN I - Abnormal; Notable for  the following:    Troponin I 0.48 (*)    All other components within normal limits  PRO B NATRIURETIC PEPTIDE - Abnormal; Notable for the following:    Pro B Natriuretic peptide (BNP) 3027.0 (*)    All other components within normal limits  URINALYSIS, ROUTINE W REFLEX MICROSCOPIC - Abnormal; Notable for the following:    Hgb urine dipstick TRACE (*)    Leukocytes, UA MODERATE (*)    All other components within normal limits  TROPONIN I - Abnormal; Notable for the following:    Troponin I 4.89 (*)    All other components within normal limits  PROTIME-INR - Abnormal; Notable for the following:    Prothrombin Time 20.6 (*)    INR 1.77 (*)  All other components within normal limits  COMPREHENSIVE METABOLIC PANEL - Abnormal; Notable for the following:    Glucose, Bld 132 (*)    GFR calc non Af Amer 67 (*)    GFR calc Af Amer 78 (*)    All other components within normal limits  MRSA PCR SCREENING  URINE MICROSCOPIC-ADD ON  MAGNESIUM  TROPONIN I  TROPONIN I  TSH  T4, FREE  HEMOGLOBIN A1C  LIPID PANEL  CBC  BASIC METABOLIC PANEL  PROTIME-INR    Imaging Review Dg Chest 2 View  01/14/2014   CLINICAL DATA:  Shoulder pain, dizziness, shortness of breath  EXAM: CHEST  2 VIEW  COMPARISON:  None.  FINDINGS: Marked cardiomegaly. Diffuse bilateral interstitial opacities with "Peripheral Kerley B-lines ". This could represent chronic interstitial lung disease versus early edema. Minimal basilar atelectasis versus scarring. No significant effusion or pneumothorax. Trachea midline. Degenerative changes of the spine. Bones are osteopenic.  IMPRESSION: Cardiomegaly with interstitial changes, possibly chronic interstitial lung disease versus early edema pattern.  Basilar atelectasis.   Electronically Signed   By: Daryll Brod M.D.   On: 01/14/2014 08:54   Ct Angio Chest Aortic Dissect W &/or W/o  01/14/2014   CLINICAL DATA:  back pain back pain; SHORTNESS OF BREATH SHORTNESS OF BREATH  EXAM: CT  ANGIOGRAPHY CHEST, ABDOMEN AND PELVIS  TECHNIQUE: Multidetector CT imaging through the chest, abdomen and pelvis was performed using the standard protocol during bolus administration of intravenous contrast. Multiplanar reconstructed images and MIPs were obtained and reviewed to evaluate the vascular anatomy.  CONTRAST:  119mL OMNIPAQUE IOHEXOL 350 MG/ML SOLN  COMPARISON:  None.  FINDINGS: CTA CHEST FINDINGS  The noncontrast scout shows no hyperdense intramural hematoma or mediastinal hematoma. Trace pericardial effusion. Small bilateral pleural effusions right greater than left. Four-chamber cardiac enlargement.  On CTA, Adequate contrast opacification of the thoracic aorta with no evidence of dissection, aneurysm, or stenosis. There is classic 3-vessel brachiocephalic arch anatomy without proximal stenosis. Small ductus diverticulum. Patchy aortic plaque. Satisfactory opacification of pulmonary arteries noted, and there is no evidence of pulmonary emboli. Mildly enlarged subcarinal lymph nodes. Subcentimeter prevascular, left suprahilar, right paratracheal, and precarinal lymph nodes. No hilar adenopathy.  Review of the MIP images confirms the above findings.  CTA ABDOMEN AND PELVIS FINDINGS  Arterial findings:  Aorta: Mild calcified atheromatous plaque. No aneurysm, dissection, or stenosis.  Celiac axis: Partially calcified origin plaque resulting in greater than 50% diameter short segment stenosis, patent distally.  Superior mesenteric: Nonocclusive ostial plaque, patent distally, with replaced right hepatic arterial supply, anatomic variant.  Left renal:          Single, patent.  Right renal:         Single, patent.  Inferior mesenteric: Patent.  Left iliac: Mild scattered plaque. No aneurysm or stenosis.  Right iliac: Mild scattered plaque. No aneurysm or stenosis.  Venous findings:     Dedicated venous phase imaging not obtained.  Review of the MIP images confirms the above findings.  Nonvascular findings:  Unremarkable liver, gallbladder, spleen, bilateral adrenal glands, kidneys. Diffuse pancreatic parenchymal atrophy. Small hiatal hernia. Stomach, small bowel, colon are decompressed. Uterus and adnexal regions unremarkable. Urinary bladder incompletely distended. Bilateral pelvic phleboliths. No ascites. No free air. No adenopathy localized. Mild spondylitic changes in the lumbar spine.  IMPRESSION: 1. Negative for aortic dissection, aneurysm, or other acute vascular abnormality. 2. Small pleural effusions, cardiomegaly, and patchy ground-glass opacities throughout both lungs suggesting CHF. 3. Subcarinal adenopathy, possibly reactive  but nonspecific. 4. Origin stenosis of the celiac axis. Widely patent SMA and IMA likely render this clinically inapparent.   Electronically Signed   By: Arne Cleveland M.D.   On: 01/14/2014 10:32   Ct Cta Abd/pel W/cm &/or W/o Cm  01/14/2014   CLINICAL DATA:  back pain back pain; SHORTNESS OF BREATH SHORTNESS OF BREATH  EXAM: CT ANGIOGRAPHY CHEST, ABDOMEN AND PELVIS  TECHNIQUE: Multidetector CT imaging through the chest, abdomen and pelvis was performed using the standard protocol during bolus administration of intravenous contrast. Multiplanar reconstructed images and MIPs were obtained and reviewed to evaluate the vascular anatomy.  CONTRAST:  170mL OMNIPAQUE IOHEXOL 350 MG/ML SOLN  COMPARISON:  None.  FINDINGS: CTA CHEST FINDINGS  The noncontrast scout shows no hyperdense intramural hematoma or mediastinal hematoma. Trace pericardial effusion. Small bilateral pleural effusions right greater than left. Four-chamber cardiac enlargement.  On CTA, Adequate contrast opacification of the thoracic aorta with no evidence of dissection, aneurysm, or stenosis. There is classic 3-vessel brachiocephalic arch anatomy without proximal stenosis. Small ductus diverticulum. Patchy aortic plaque. Satisfactory opacification of pulmonary arteries noted, and there is no evidence of pulmonary  emboli. Mildly enlarged subcarinal lymph nodes. Subcentimeter prevascular, left suprahilar, right paratracheal, and precarinal lymph nodes. No hilar adenopathy.  Review of the MIP images confirms the above findings.  CTA ABDOMEN AND PELVIS FINDINGS  Arterial findings:  Aorta: Mild calcified atheromatous plaque. No aneurysm, dissection, or stenosis.  Celiac axis: Partially calcified origin plaque resulting in greater than 50% diameter short segment stenosis, patent distally.  Superior mesenteric: Nonocclusive ostial plaque, patent distally, with replaced right hepatic arterial supply, anatomic variant.  Left renal:          Single, patent.  Right renal:         Single, patent.  Inferior mesenteric: Patent.  Left iliac: Mild scattered plaque. No aneurysm or stenosis.  Right iliac: Mild scattered plaque. No aneurysm or stenosis.  Venous findings:     Dedicated venous phase imaging not obtained.  Review of the MIP images confirms the above findings.  Nonvascular findings: Unremarkable liver, gallbladder, spleen, bilateral adrenal glands, kidneys. Diffuse pancreatic parenchymal atrophy. Small hiatal hernia. Stomach, small bowel, colon are decompressed. Uterus and adnexal regions unremarkable. Urinary bladder incompletely distended. Bilateral pelvic phleboliths. No ascites. No free air. No adenopathy localized. Mild spondylitic changes in the lumbar spine.  IMPRESSION: 1. Negative for aortic dissection, aneurysm, or other acute vascular abnormality. 2. Small pleural effusions, cardiomegaly, and patchy ground-glass opacities throughout both lungs suggesting CHF. 3. Subcarinal adenopathy, possibly reactive but nonspecific. 4. Origin stenosis of the celiac axis. Widely patent SMA and IMA likely render this clinically inapparent.   Electronically Signed   By: Arne Cleveland M.D.   On: 01/14/2014 10:32     EKG Interpretation   Date/Time:  Saturday January 14 2014 07:59:23 EDT Ventricular Rate:  97 PR Interval:  245 QRS  Duration: 100 QT Interval:  426 QTC Calculation: 541 R Axis:   73 Text Interpretation:  Sinus or ectopic atrial rhythm Prolonged PR interval  Repol abnrm suggests ischemia, lateral leads Prolonged QT interval No  previous ECGs available Confirmed by Wyvonnia Dusky  MD, [place] ([id number]) on  01/14/2014 8:20:15 AM      MDM   Final diagnoses:  NSTEMI (non-ST elevated myocardial infarction)   SOB with pain between scapula that has since resolved.  No CHest pain. EKG with nonspecific changes inferiorly. ST depression in I.   Rales on exam with congestion on CXR.  Lasix given. INR 2.3 and therapeutic. CTA of thorax negative for dissection.  Suspect CHF exacerbation. Troponin 0.48.  No chest pain.  No STEMI. D/w Dr. [place]ique who will see patient and admit.  No heparin as she is already on coumadin.   Ezequiel Essex, MD 01/14/14 1800

## 2014-01-14 NOTE — ED Notes (Signed)
86-90% While abmulating

## 2014-01-14 NOTE — Progress Notes (Signed)
ANTICOAGULATION CONSULT NOTE - Follow Up Consult  Pharmacy Consult for Heparin Indication: chest pain/ACS and atrial fibrillation  Allergies  Allergen Reactions  . Welchol [Colesevelam Hcl] Nausea Only  . Zetia [Ezetimibe] Other (See Comments)    Edema   . Crestor [Rosuvastatin] Other (See Comments)    Unknown  . Zocor [Simvastatin] Other (See Comments)    Muscle weakness    Patient Measurements: Height: 5\' 2"  (157.5 cm) Weight: 161 lb 6 oz (73.2 kg) IBW/kg (Calculated) : 50.1 Heparin Dosing Weight: 66 kg  Vital Signs: Temp: 98.3 F (36.8 C) (07/25 1536) Temp src: Oral (07/25 1536) BP: 160/58 mmHg (07/25 1536) Pulse Rate: 66 (07/25 1536)  Labs:  Recent Labs  01/14/14 0810 01/14/14 0820 01/14/14 1645  HGB 11.4*  --   --   HCT 34.9*  --   --   PLT 251  --   --   LABPROT 26.0*  --  20.6*  INR 2.38*  --  1.77*  CREATININE 0.75  --  0.81  TROPONINI  --  0.48* 4.89*    Estimated Creatinine Clearance: 52.7 ml/min (by C-G formula based on Cr of 0.81).   Medications:  Infusions:    Assessment: 80 year old female admitted with heart failure exacerbation and now found to have NSTEMI.  She is on chronic anticoagulation with Warfarin for atrial fibrillation.  Her INR was 2.38 this morning, but 1.77 on repeat this afternoon.  Warfarin has been discontinued and a request received to begin IV Heparin.   Goal of Therapy:  Heparin level 0.3-0.7 units/ml Monitor platelets by anticoagulation protocol: Yes   Plan:  No heparin bolus Start heparin infusion at 900 units/hr Check heparin level in 8 hours Daily heparin level and CBC  Legrand Como, Pharm.D., BCPS, AAHIVP Clinical Pharmacist Phone: [phone] or [phone] 01/14/2014, 7:53 PM

## 2014-01-14 NOTE — ED Notes (Addendum)
Pt to ED via [facility] from [facility] c/o one episode of pain between shoulder blades.  Pt reports laying the bed this morning when a dull pain started, attempted to move to different positions, and pain subsided. Hx of CHF; EMS gave 4mg  zofran for nausea en route

## 2014-01-14 NOTE — ED Notes (Signed)
Attempted to give report 

## 2014-01-15 LAB — BASIC METABOLIC PANEL
Anion gap: 13 (ref 5–15)
BUN: 17 mg/dL (ref 6–23)
CALCIUM: 8.5 mg/dL (ref 8.4–10.5)
CHLORIDE: 97 meq/L (ref 96–112)
CO2: 28 meq/L (ref 19–32)
CREATININE: 1.04 mg/dL (ref 0.50–1.10)
GFR calc Af Amer: 58 mL/min — ABNORMAL LOW (ref >90)
GFR calc non Af Amer: 50 mL/min — ABNORMAL LOW (ref >90)
GLUCOSE: 154 mg/dL — AB (ref 70–99)
Potassium: 3.9 mEq/L (ref 3.7–5.3)
Sodium: 138 mEq/L (ref 137–147)

## 2014-01-15 LAB — LIPID PANEL
Cholesterol: 157 mg/dL (ref 0–200)
HDL: 47 mg/dL (ref >39)
LDL Cholesterol: 91 mg/dL (ref 0–99)
Total CHOL/HDL Ratio: 3.3 RATIO
Triglycerides: 95 mg/dL (ref <150)
VLDL: 19 mg/dL (ref 0–40)

## 2014-01-15 LAB — T4, FREE: Free T4: 1.4 ng/dL (ref 0.80–1.80)

## 2014-01-15 LAB — HEPARIN LEVEL (UNFRACTIONATED)
Heparin Unfractionated: 0.17 IU/mL — ABNORMAL LOW (ref 0.30–0.70)
Heparin Unfractionated: 0.82 IU/mL — ABNORMAL HIGH (ref 0.30–0.70)

## 2014-01-15 LAB — GLUCOSE, CAPILLARY
GLUCOSE-CAPILLARY: 108 mg/dL — AB (ref 70–99)
Glucose-Capillary: 151 mg/dL — ABNORMAL HIGH (ref 70–99)
Glucose-Capillary: 191 mg/dL — ABNORMAL HIGH (ref 70–99)
Glucose-Capillary: 254 mg/dL — ABNORMAL HIGH (ref 70–99)

## 2014-01-15 LAB — CBC
HCT: 32 % — ABNORMAL LOW (ref 36.0–46.0)
Hemoglobin: 10.6 g/dL — ABNORMAL LOW (ref 12.0–15.0)
MCH: 29.1 pg (ref 26.0–34.0)
MCHC: 33.1 g/dL (ref 30.0–36.0)
MCV: 87.9 fL (ref 78.0–100.0)
Platelets: 252 10*3/uL (ref 150–400)
RBC: 3.64 MIL/uL — ABNORMAL LOW (ref 3.87–5.11)
RDW: 13.8 % (ref 11.5–15.5)
WBC: 6.4 10*3/uL (ref 4.0–10.5)

## 2014-01-15 LAB — TROPONIN I: TROPONIN I: 3.61 ng/mL — AB (ref <0.30)

## 2014-01-15 LAB — PROTIME-INR
INR: 1.82 — AB (ref 0.00–1.49)
PROTHROMBIN TIME: 21.1 s — AB (ref 11.6–15.2)

## 2014-01-15 MED ORDER — HEPARIN (PORCINE) IN NACL 100-0.45 UNIT/ML-% IJ SOLN
1000.0000 [IU]/h | INTRAMUSCULAR | Status: DC
  Administered 2014-01-15: 1000 [IU]/h via INTRAVENOUS
  Filled 2014-01-15: qty 250

## 2014-01-15 NOTE — Progress Notes (Addendum)
    Subjective:  Denies CP or dyspnea   Objective:  Filed Vitals:   01/15/14 0500 01/15/14 0828 01/15/14 0830 01/15/14 0844  BP:  124/46 124/46   Pulse:   67 71  Temp:    98.4 F (36.9 C)  TempSrc:    Oral  Resp:   24 20  Height:      Weight: 158 lb 3.2 oz (71.759 kg)     SpO2:   100% 91%    Intake/Output from previous day:  Intake/Output Summary (Last 24 hours) at 01/15/14 0936 Last data filed at 01/15/14 0400  Gross per 24 hour  Intake 112.35 ml  Output    350 ml  Net -237.65 ml    Physical Exam: Physical exam: Well-developed well-nourished in no acute distress.  Skin is warm and dry.  HEENT is normal.  Neck is supple. Chest is clear to auscultation with normal expansion.  Cardiovascular exam is regular rate and rhythm.  Abdominal exam nontender or distended. No masses palpated. Extremities show no edema. neuro grossly intact    Lab Results: Basic Metabolic Panel:  Recent Labs  01/14/14 0810 01/14/14 1645 01/15/14 0225  NA 138 138 138  K 4.9 4.2 3.9  CL 101 97 97  CO2 21 28 28   GLUCOSE 190* 132* 154*  BUN 15 12 17   CREATININE 0.75 0.81 1.04  CALCIUM 9.2 9.4 8.5  MG  --  1.6  --    CBC:  Recent Labs  01/14/14 0810 01/15/14 0225  WBC 7.0 6.4  NEUTROABS 4.3  --   HGB 11.4* 10.6*  HCT 34.9* 32.0*  MCV 88.1 87.9  PLT 251 252   Cardiac Enzymes:  Recent Labs  01/14/14 1645 01/14/14 2122 01/15/14 0225  TROPONINI 4.89* 4.01* 3.61*     Assessment/Plan:  1 non-ST elevation myocardial infarction-patient has ruled in. Continue aspirin, heparin and beta blocker. Intolerant to statins. Plan cardiac catheterization tomorrow morning if INR less than 1.6. The risks and benefits were discussed and the patient agrees to proceed. 2 acute diastolic congestive heart failure-continue gentle diuresis but hold after AM dose for cath tomorrow; follow renal function. 3 history of paroxysmal atrial fibrillation-patient remains in sinus rhythm. Continue  metoprolol. Resume Coumadin once cardiac catheterization plus +- revascularization complete. 4 hypertension-continue present medications.  Kirk Ruths 01/15/2014, 9:36 AM   UA with bacteria-check urine culture Kirk Ruths

## 2014-01-15 NOTE — Progress Notes (Signed)
ANTICOAGULATION CONSULT NOTE - Follow Up Consult  Pharmacy Consult for Heparin  Indication: chest pain/ACS and atrial fibrillation  Allergies  Allergen Reactions  . Welchol [Colesevelam Hcl] Nausea Only  . Zetia [Ezetimibe] Other (See Comments)    Edema   . Crestor [Rosuvastatin] Other (See Comments)    Unknown  . Zocor [Simvastatin] Other (See Comments)    Muscle weakness    Patient Measurements: Height: 5\' 2"  (157.5 cm) Weight: 161 lb 6 oz (73.2 kg) IBW/kg (Calculated) : 50.1 Heparin Dosing Weight: 66 kg  Vital Signs: Temp: 98.3 F (36.8 C) (07/26 0347) Temp src: Oral (07/26 0347) BP: 129/46 mmHg (07/25 2256) Pulse Rate: 62 (07/26 0200)  Labs:  Recent Labs  01/14/14 0810  01/14/14 1645 01/14/14 2122 01/15/14 0225  HGB 11.4*  --   --   --  10.6*  HCT 34.9*  --   --   --  32.0*  PLT 251  --   --   --  252  LABPROT 26.0*  --  20.6*  --  21.1*  INR 2.38*  --  1.77*  --  1.82*  HEPARINUNFRC  --   --   --   --  0.17*  CREATININE 0.75  --  0.81  --  1.04  TROPONINI  --   < > 4.89* 4.01* 3.61*  < > = values in this interval not displayed.  Estimated Creatinine Clearance: 41.1 ml/min (by C-G formula based on Cr of 1.04).   Medications:  Heparin 900 units/hr  Assessment: 80 y/o F on heparin for NSTEMI/afib. Warfarin PTA on hold, INR this AM is 1.82. HL is low at 0.17. Other labs as above. No issues per RN.   Goal of Therapy:  Heparin level 0.3-0.7 units/ml Monitor platelets by anticoagulation protocol: Yes   Plan:  -Increase heparin drip to 1100 units/hr -1300 HL -Daily CBC/HL -Monitor for bleeding  Narda Bonds 01/15/2014,4:08 AM

## 2014-01-15 NOTE — Progress Notes (Signed)
ANTICOAGULATION CONSULT NOTE - Follow Up Consult  Pharmacy Consult for Heparin  Indication: chest pain/ACS and atrial fibrillation  Allergies  Allergen Reactions  . Welchol [Colesevelam Hcl] Nausea Only  . Zetia [Ezetimibe] Other (See Comments)    Edema   . Crestor [Rosuvastatin] Other (See Comments)    Unknown  . Zocor [Simvastatin] Other (See Comments)    Muscle weakness    Patient Measurements: Height: 5\' 2"  (157.5 cm) Weight: 158 lb 3.2 oz (71.759 kg) IBW/kg (Calculated) : 50.1 Heparin Dosing Weight: 66 kg  Vital Signs: Temp: 98 F (36.7 C) (07/26 1300) Temp src: Oral (07/26 1300) BP: 130/42 mmHg (07/26 1120) Pulse Rate: 65 (07/26 1120)  Labs:  Recent Labs  01/14/14 0810  01/14/14 1645 01/14/14 2122 01/15/14 0225 01/15/14 1236  HGB 11.4*  --   --   --  10.6*  --   HCT 34.9*  --   --   --  32.0*  --   PLT 251  --   --   --  252  --   LABPROT 26.0*  --  20.6*  --  21.1*  --   INR 2.38*  --  1.77*  --  1.82*  --   HEPARINUNFRC  --   --   --   --  0.17* 0.82*  CREATININE 0.75  --  0.81  --  1.04  --   TROPONINI  --   < > 4.89* 4.01* 3.61*  --   < > = values in this interval not displayed.  Estimated Creatinine Clearance: 40.7 ml/min (by C-G formula based on Cr of 1.04).   Medications:  Heparin 1100 units/hr  Assessment: 80 y/o F on heparin for NSTEMI/afib. Warfarin PTA on hold, INR this AM is 1.82.  Heparin level is 0.82 on heparin iat 1100 units /hr. Patient noted for cath 7/27.  Goal of Therapy:  Heparin level 0.3-0.7 units/ml Monitor platelets by anticoagulation protocol: Yes   Plan:  -Decrease heparin to 1000 units/hr -Heparin level in 8 hrs  Hildred Laser, Pharm D 01/15/2014 2:25 PM

## 2014-01-15 NOTE — Progress Notes (Signed)
Utilization review completed.  

## 2014-01-16 DIAGNOSIS — I059 Rheumatic mitral valve disease, unspecified: Secondary | ICD-10-CM

## 2014-01-16 DIAGNOSIS — I509 Heart failure, unspecified: Secondary | ICD-10-CM

## 2014-01-16 LAB — GLUCOSE, CAPILLARY
GLUCOSE-CAPILLARY: 115 mg/dL — AB (ref 70–99)
GLUCOSE-CAPILLARY: 201 mg/dL — AB (ref 70–99)
GLUCOSE-CAPILLARY: 138 mg/dL — AB (ref 70–99)
Glucose-Capillary: 157 mg/dL — ABNORMAL HIGH (ref 70–99)

## 2014-01-16 LAB — BASIC METABOLIC PANEL
Anion gap: 13 (ref 5–15)
BUN: 24 mg/dL — ABNORMAL HIGH (ref 6–23)
CALCIUM: 8.4 mg/dL (ref 8.4–10.5)
CO2: 26 mEq/L (ref 19–32)
Chloride: 95 mEq/L — ABNORMAL LOW (ref 96–112)
Creatinine, Ser: 1.3 mg/dL — ABNORMAL HIGH (ref 0.50–1.10)
GFR calc non Af Amer: 38 mL/min — ABNORMAL LOW (ref >90)
GFR, EST AFRICAN AMERICAN: 44 mL/min — AB (ref >90)
Glucose, Bld: 149 mg/dL — ABNORMAL HIGH (ref 70–99)
POTASSIUM: 3.7 meq/L (ref 3.7–5.3)
SODIUM: 134 meq/L — AB (ref 137–147)

## 2014-01-16 LAB — CBC
HCT: 31.6 % — ABNORMAL LOW (ref 36.0–46.0)
Hemoglobin: 10.4 g/dL — ABNORMAL LOW (ref 12.0–15.0)
MCH: 28.8 pg (ref 26.0–34.0)
MCHC: 32.9 g/dL (ref 30.0–36.0)
MCV: 87.5 fL (ref 78.0–100.0)
Platelets: 247 10*3/uL (ref 150–400)
RBC: 3.61 MIL/uL — ABNORMAL LOW (ref 3.87–5.11)
RDW: 13.8 % (ref 11.5–15.5)
WBC: 6.8 10*3/uL (ref 4.0–10.5)

## 2014-01-16 LAB — HEPARIN LEVEL (UNFRACTIONATED)
Heparin Unfractionated: 0.66 IU/mL (ref 0.30–0.70)
Heparin Unfractionated: 0.49 IU/mL (ref 0.30–0.70)
Heparin Unfractionated: 0.84 IU/mL — ABNORMAL HIGH (ref 0.30–0.70)

## 2014-01-16 LAB — PROTIME-INR
INR: 1.98 — ABNORMAL HIGH (ref 0.00–1.49)
INR: 1.87 — ABNORMAL HIGH (ref 0.00–1.49)
PROTHROMBIN TIME: 22.5 s — AB (ref 11.6–15.2)
Prothrombin Time: 21.5 seconds — ABNORMAL HIGH (ref 11.6–15.2)

## 2014-01-16 MED ORDER — HEPARIN (PORCINE) IN NACL 100-0.45 UNIT/ML-% IJ SOLN
900.0000 [IU]/h | INTRAMUSCULAR | Status: DC
  Administered 2014-01-17: 900 [IU]/h via INTRAVENOUS
  Filled 2014-01-16 (×3): qty 250

## 2014-01-16 NOTE — Progress Notes (Addendum)
  [place] for Heparin  Indication: chest pain/ACS and atrial fibrillation  Allergies  Allergen Reactions  . Welchol [Colesevelam Hcl] Nausea Only  . Zetia [Ezetimibe] Other (See Comments)    Edema   . Crestor [Rosuvastatin] Other (See Comments)    Unknown  . Zocor [Simvastatin] Other (See Comments)    Muscle weakness    Patient Measurements: Height: 5\' 2"  (157.5 cm) Weight: 159 lb (72.122 kg) IBW/kg (Calculated) : 50.1 Heparin Dosing Weight: 66 kg  Vital Signs: Temp: 98.6 F (37 C) (07/27 0745) Temp src: Oral (07/27 0745) BP: 119/62 mmHg (07/27 0745) Pulse Rate: 63 (07/27 0600)  Labs:  Recent Labs  01/14/14 0810  01/14/14 1645 01/14/14 2122  01/15/14 0225 01/15/14 1236 01/15/14 2300 01/16/14 0255  HGB 11.4*  --   --   --   --  10.6*  --   --  10.4*  HCT 34.9*  --   --   --   --  32.0*  --   --  31.6*  PLT 251  --   --   --   --  252  --   --  247  LABPROT 26.0*  --  20.6*  --   --  21.1*  --   --  22.5*  INR 2.38*  --  1.77*  --   --  1.82*  --   --  1.98*  HEPARINUNFRC  --   --   --   --   < > 0.17* 0.82* 0.66 0.49  CREATININE 0.75  --  0.81  --   --  1.04  --   --  1.30*  TROPONINI  --   < > 4.89* 4.01*  --  3.61*  --   --   --   < > = values in this interval not displayed.  Estimated Creatinine Clearance: 32.6 ml/min (by C-G formula based on Cr of 1.3).  . heparin 1,000 Units/hr (01/16/14 0700)     Assessment: 80 y/o F with NSTEMI, h/o Afib and Coumadin on hold, on IV heparin at 1000 units/hr with therapeutic heparin level.  INR rising slightly, last dose of Coumadin 7/25.  No bleeding or complications noted.  Awaiting cath once INR < 1.6  Goal of Therapy:  Heparin level 0.3-0.7 units/ml Monitor platelets by anticoagulation protocol: Yes   Plan:  Continue Heparin at current rate. Plan to recheck INR later this afternoon. Will recheck heparin level as well to confirm remains therapeutic. Continue daily CBC and  heparin level.  Uvaldo Rising, BCPS  Clinical Pharmacist Pager [phone]  01/16/2014 10:05 AM

## 2014-01-16 NOTE — Progress Notes (Signed)
Patient ID: Ana [name], female   DOB: 23-Jun-1932, 80 y.o.   MRN: [id number]    Subjective:  Denies CP or dyspnea this morning.  INR 1.98 today.   Scheduled Meds: . antiseptic oral rinse  15 mL Mouth Rinse BID  . aspirin EC  81 mg Oral Daily  . insulin aspart  0-15 Units Subcutaneous TID WC  . losartan  100 mg Oral Daily  . metoprolol  25 mg Oral BID   Continuous Infusions: . heparin 1,000 Units/hr (01/16/14 0700)   PRN Meds:.acetaminophen, nitroGLYCERIN, ondansetron (ZOFRAN) IV    Objective:  Filed Vitals:   01/16/14 0400 01/16/14 0500 01/16/14 0600 01/16/14 0745  BP:  125/37 125/37 119/62  Pulse: 70 72 63   Temp:    98.6 F (37 C)  TempSrc:    Oral  Resp: 18 20 17 14   Height:      Weight:  159 lb (72.122 kg)    SpO2: 89% 90% 90% 95%    Intake/Output from previous day:  Intake/Output Summary (Last 24 hours) at 01/16/14 1003 Last data filed at 01/16/14 0900  Gross per 24 hour  Intake    460 ml  Output    100 ml  Net    360 ml    Physical Exam: Physical exam: Well-developed well-nourished in no acute distress.  Skin is warm and dry.  HEENT is normal.  Neck is supple, JVP 7-8 cm. Chest is clear to auscultation with normal expansion.  Cardiovascular exam is regular rate and rhythm.  Abdominal exam nontender or distended. No masses palpated. Extremities show no edema. neuro grossly intact    Lab Results: Basic Metabolic Panel:  Recent Labs  01/14/14 0810 01/14/14 1645 01/15/14 0225 01/16/14 0255  NA 138 138 138 134*  K 4.9 4.2 3.9 3.7  CL 101 97 97 95*  CO2 21 28 28 26   GLUCOSE 190* 132* 154* 149*  BUN 15 12 17  24*  CREATININE 0.75 0.81 1.04 1.30*  CALCIUM 9.2 9.4 8.5 8.4  MG  --  1.6  --   --    CBC:  Recent Labs  01/14/14 0810 01/15/14 0225 01/16/14 0255  WBC 7.0 6.4 6.8  NEUTROABS 4.3  --   --   HGB 11.4* 10.6* 10.4*  HCT 34.9* 32.0* 31.6*  MCV 88.1 87.9 87.5  PLT 251 252 247   Cardiac Enzymes:  Recent Labs  01/14/14 1645  01/14/14 2122 01/15/14 0225  TROPONINI 4.89* 4.01* 3.61*     Assessment/Plan:  1. NSTEMI: Patient has ruled in with troponin peak 4.8. Continue aspirin, heparin and beta blocker. Intolerant to statins. Plan cardiac catheterization: INR 1.98 at 4 am, will repeat at 1 pm and plan LHC today if INR < 1.6, otherwise will have to wait until tomorrow. The risks and benefits were discussed and the patient agrees to proceed. 2. Acute diastolic CHF: Lasix on hold for LHC today (hopefully). 3. Paroxysmal atrial fibrillation: Patient remains in sinus rhythm. Continue metoprolol. Resume Coumadin once cardiac catheterization plus +/- revascularization complete.  Will need to be on coumadin long-term so would favor Plavix if stent needed.  4. HTN: BP stable.  5. Possible UTI: Awaiting urine culture.  WBCs normal.   Loralie Champagne 01/16/2014, 10:03 AM

## 2014-01-16 NOTE — Progress Notes (Signed)
  [place] for Heparin  Indication: chest pain/ACS and atrial fibrillation  Allergies  Allergen Reactions  . Welchol [Colesevelam Hcl] Nausea Only  . Zetia [Ezetimibe] Other (See Comments)    Edema   . Crestor [Rosuvastatin] Other (See Comments)    Unknown  . Zocor [Simvastatin] Other (See Comments)    Muscle weakness    Patient Measurements: Height: 5\' 2"  (157.5 cm) Weight: 159 lb (72.122 kg) IBW/kg (Calculated) : 50.1 Heparin Dosing Weight: 66 kg  Vital Signs: Temp: 98.4 F (36.9 C) (07/27 1200) Temp src: Oral (07/27 1200) BP: 121/50 mmHg (07/27 1200) Pulse Rate: 68 (07/27 1030)  Labs:  Recent Labs  01/14/14 0810  01/14/14 1645 01/14/14 2122 01/15/14 0225  01/15/14 2300 01/16/14 0255 01/16/14 1245  HGB 11.4*  --   --   --  10.6*  --   --  10.4*  --   HCT 34.9*  --   --   --  32.0*  --   --  31.6*  --   PLT 251  --   --   --  252  --   --  247  --   LABPROT 26.0*  --  20.6*  --  21.1*  --   --  22.5* 21.5*  INR 2.38*  --  1.77*  --  1.82*  --   --  1.98* 1.87*  HEPARINUNFRC  --   --   --   --  0.17*  < > 0.66 0.49 0.84*  CREATININE 0.75  --  0.81  --  1.04  --   --  1.30*  --   TROPONINI  --   < > 4.89* 4.01* 3.61*  --   --   --   --   < > = values in this interval not displayed.  Estimated Creatinine Clearance: 32.6 ml/min (by C-G formula based on Cr of 1.3).  . heparin 900 Units/hr (01/16/14 1436)     Assessment: 80 y/o F with NSTEMI, h/o Afib and Coumadin on hold, INR rising slightly, last dose of Coumadin 7/25.  No bleeding or complications noted.  Awaiting cath once INR < 1.6  AM heparin level therapeutic on 1000 units/hr, but confirmatory level now elevated.   Goal of Therapy:  Heparin level 0.3-0.7 units/ml Monitor platelets by anticoagulation protocol: Yes   Plan:  Decrease IV heparin gtt to 900 units/hr. Will recheck heparin level in 8 hrs. Continue daily CBC and heparin level.  Uvaldo Rising, BCPS  Clinical Pharmacist Pager [phone]  01/16/2014 2:35 PM

## 2014-01-16 NOTE — Progress Notes (Signed)
  Echocardiogram 2D Echocardiogram has been performed.  Darlina Sicilian M 01/16/2014, 11:34 AM

## 2014-01-16 NOTE — Progress Notes (Signed)
[place] for Heparin  Indication: chest pain/ACS and atrial fibrillation  Allergies  Allergen Reactions  . Welchol [Colesevelam Hcl] Nausea Only  . Zetia [Ezetimibe] Other (See Comments)    Edema   . Crestor [Rosuvastatin] Other (See Comments)    Unknown  . Zocor [Simvastatin] Other (See Comments)    Muscle weakness    Patient Measurements: Height: 5\' 2"  (157.5 cm) Weight: 158 lb 3.2 oz (71.759 kg) IBW/kg (Calculated) : 50.1 Heparin Dosing Weight: 66 kg  Vital Signs: Temp: 98.5 F (36.9 C) (07/26 2000) Temp src: Oral (07/26 2000) BP: 107/38 mmHg (07/26 1600) Pulse Rate: 62 (07/26 1600)  Labs:  Recent Labs  01/14/14 0810  01/14/14 1645 01/14/14 2122 01/15/14 0225 01/15/14 1236 01/15/14 2300  HGB 11.4*  --   --   --  10.6*  --   --   HCT 34.9*  --   --   --  32.0*  --   --   PLT 251  --   --   --  252  --   --   LABPROT 26.0*  --  20.6*  --  21.1*  --   --   INR 2.38*  --  1.77*  --  1.82*  --   --   HEPARINUNFRC  --   --   --   --  0.17* 0.82* 0.66  CREATININE 0.75  --  0.81  --  1.04  --   --   TROPONINI  --   < > 4.89* 4.01* 3.61*  --   --   < > = values in this interval not displayed.  Estimated Creatinine Clearance: 40.7 ml/min (by C-G formula based on Cr of 1.04).  Assessment: 80 y/o F with NSTEMI, h/o Afib and Coumadin on hold, for heparin  Goal of Therapy:  Heparin level 0.3-0.7 units/ml Monitor platelets by anticoagulation protocol: Yes   Plan:  Continue Heparin at current rate   Phillis Knack, PharmD, BCPS  01/16/2014 12:46 AM

## 2014-01-17 ENCOUNTER — Encounter (HOSPITAL_COMMUNITY)
Admission: EM | Disposition: A | Discharge status: Home / Self Care | Payer: 59 | Source: Home / Self Care | Attending: Cardiology

## 2014-01-17 DIAGNOSIS — I4891 Unspecified atrial fibrillation: Secondary | ICD-10-CM

## 2014-01-17 DIAGNOSIS — I2510 Atherosclerotic heart disease of native coronary artery without angina pectoris: Secondary | ICD-10-CM

## 2014-01-17 DIAGNOSIS — N3000 Acute cystitis without hematuria: Secondary | ICD-10-CM

## 2014-01-17 DIAGNOSIS — I10 Essential (primary) hypertension: Secondary | ICD-10-CM

## 2014-01-17 HISTORY — PX: PERCUTANEOUS CORONARY STENT INTERVENTION (PCI-S): SHX5485

## 2014-01-17 HISTORY — PX: LEFT HEART CATHETERIZATION WITH CORONARY ANGIOGRAM: SHX5451

## 2014-01-17 LAB — CBC
HCT: 31.5 % — ABNORMAL LOW (ref 36.0–46.0)
Hemoglobin: 10.6 g/dL — ABNORMAL LOW (ref 12.0–15.0)
MCH: 29.4 pg (ref 26.0–34.0)
MCHC: 33.7 g/dL (ref 30.0–36.0)
MCV: 87.5 fL (ref 78.0–100.0)
PLATELETS: 241 10*3/uL (ref 150–400)
RBC: 3.6 MIL/uL — AB (ref 3.87–5.11)
RDW: 13.7 % (ref 11.5–15.5)
WBC: 5.9 10*3/uL (ref 4.0–10.5)

## 2014-01-17 LAB — BASIC METABOLIC PANEL
Anion gap: 15 (ref 5–15)
BUN: 23 mg/dL (ref 6–23)
CALCIUM: 8.6 mg/dL (ref 8.4–10.5)
CO2: 26 mEq/L (ref 19–32)
CREATININE: 1.05 mg/dL (ref 0.50–1.10)
Chloride: 96 mEq/L (ref 96–112)
GFR calc non Af Amer: 49 mL/min — ABNORMAL LOW (ref >90)
GFR, EST AFRICAN AMERICAN: 57 mL/min — AB (ref >90)
Glucose, Bld: 144 mg/dL — ABNORMAL HIGH (ref 70–99)
Potassium: 3.9 mEq/L (ref 3.7–5.3)
SODIUM: 137 meq/L (ref 137–147)

## 2014-01-17 LAB — URINE CULTURE

## 2014-01-17 LAB — GLUCOSE, CAPILLARY
GLUCOSE-CAPILLARY: 139 mg/dL — AB (ref 70–99)
GLUCOSE-CAPILLARY: 137 mg/dL — AB (ref 70–99)
Glucose-Capillary: 150 mg/dL — ABNORMAL HIGH (ref 70–99)
Glucose-Capillary: 187 mg/dL — ABNORMAL HIGH (ref 70–99)

## 2014-01-17 LAB — PROTIME-INR
INR: 1.65 — AB (ref 0.00–1.49)
PROTHROMBIN TIME: 19.5 s — AB (ref 11.6–15.2)

## 2014-01-17 LAB — HEPARIN LEVEL (UNFRACTIONATED): Heparin Unfractionated: 0.56 IU/mL (ref 0.30–0.70)

## 2014-01-17 LAB — POCT ACTIVATED CLOTTING TIME: ACTIVATED CLOTTING TIME: 501 s

## 2014-01-17 SURGERY — LEFT HEART CATHETERIZATION WITH CORONARY ANGIOGRAM
Anesthesia: LOCAL

## 2014-01-17 MED ORDER — VERAPAMIL HCL 2.5 MG/ML IV SOLN
INTRAVENOUS | Status: AC
  Filled 2014-01-17: qty 2

## 2014-01-17 MED ORDER — SODIUM CHLORIDE 0.9 % IV SOLN
INTRAVENOUS | Status: DC
  Administered 2014-01-17: 06:00:00 via INTRAVENOUS

## 2014-01-17 MED ORDER — CLOPIDOGREL BISULFATE 300 MG PO TABS
ORAL_TABLET | ORAL | Status: AC
  Filled 2014-01-17: qty 1

## 2014-01-17 MED ORDER — NITROGLYCERIN 1 MG/10 ML FOR IR/CATH LAB
INTRA_ARTERIAL | Status: AC
  Filled 2014-01-17: qty 10

## 2014-01-17 MED ORDER — ASPIRIN 81 MG PO CHEW: 81.0000 mg | CHEWABLE_TABLET | ORAL | Status: DC

## 2014-01-17 MED ORDER — ACETAMINOPHEN 325 MG PO TABS: 650.0000 mg | ORAL_TABLET | ORAL | Status: DC | PRN

## 2014-01-17 MED ORDER — LIDOCAINE HCL (PF) 1 % IJ SOLN
INTRAMUSCULAR | Status: AC
  Filled 2014-01-17: qty 30

## 2014-01-17 MED ORDER — DEXTROSE 5 % IV SOLN
1.0000 g | INTRAVENOUS | Status: AC
  Administered 2014-01-17 – 2014-01-19 (×3): 1 g via INTRAVENOUS
  Filled 2014-01-17 (×3): qty 10

## 2014-01-17 MED ORDER — FENTANYL CITRATE 0.05 MG/ML IJ SOLN
INTRAMUSCULAR | Status: AC
  Filled 2014-01-17: qty 2

## 2014-01-17 MED ORDER — ONDANSETRON HCL 4 MG/2ML IJ SOLN: 4.0000 mg | Freq: Four times a day (QID) | INTRAMUSCULAR | Status: DC | PRN

## 2014-01-17 MED ORDER — SODIUM CHLORIDE 0.9 % IJ SOLN: 3.0000 mL | INTRAMUSCULAR | Status: DC | PRN

## 2014-01-17 MED ORDER — SODIUM CHLORIDE 0.9 % IV SOLN: 250.0000 mL | INTRAVENOUS | Status: DC | PRN

## 2014-01-17 MED ORDER — BIVALIRUDIN 250 MG IV SOLR
INTRAVENOUS | Status: AC
  Filled 2014-01-17: qty 250

## 2014-01-17 MED ORDER — MIDAZOLAM HCL 2 MG/2ML IJ SOLN
INTRAMUSCULAR | Status: AC
  Filled 2014-01-17: qty 2

## 2014-01-17 MED ORDER — HEPARIN (PORCINE) IN NACL 100-0.45 UNIT/ML-% IJ SOLN
1050.0000 [IU]/h | INTRAMUSCULAR | Status: DC
  Administered 2014-01-18: 900 [IU]/h via INTRAVENOUS
  Administered 2014-01-19: 1050 [IU]/h via INTRAVENOUS
  Filled 2014-01-17 (×3): qty 250

## 2014-01-17 MED ORDER — HEPARIN (PORCINE) IN NACL 2-0.9 UNIT/ML-% IJ SOLN
INTRAMUSCULAR | Status: AC
  Filled 2014-01-17: qty 1000

## 2014-01-17 MED ORDER — SODIUM CHLORIDE 0.9 % IJ SOLN
3.0000 mL | Freq: Two times a day (BID) | INTRAMUSCULAR | Status: DC
  Administered 2014-01-17: 3 mL via INTRAVENOUS

## 2014-01-17 MED ORDER — SODIUM CHLORIDE 0.9 % IV SOLN: INTRAVENOUS | Status: DC

## 2014-01-17 MED ORDER — CLOPIDOGREL BISULFATE 75 MG PO TABS
75.0000 mg | ORAL_TABLET | Freq: Every day | ORAL | Status: DC
  Administered 2014-01-18 – 2014-01-20 (×3): 75 mg via ORAL
  Filled 2014-01-17 (×4): qty 1

## 2014-01-17 MED ORDER — HEPARIN (PORCINE) IN NACL 100-0.45 UNIT/ML-% IJ SOLN: 900.0000 [IU]/h | INTRAMUSCULAR | Status: DC

## 2014-01-17 MED ORDER — HEPARIN SODIUM (PORCINE) 1000 UNIT/ML IJ SOLN
INTRAMUSCULAR | Status: AC
  Filled 2014-01-17: qty 1

## 2014-01-17 MED ORDER — ASPIRIN EC 81 MG PO TBEC
81.0000 mg | DELAYED_RELEASE_TABLET | Freq: Every day | ORAL | Status: DC
  Administered 2014-01-18: 81 mg via ORAL
  Filled 2014-01-17: qty 1

## 2014-01-17 MED ORDER — NITROGLYCERIN 1 MG/10 ML FOR IR/CATH LAB
INTRA_ARTERIAL | Status: AC
Start: 2014-01-17 — End: 2014-01-17
  Filled 2014-01-17: qty 10

## 2014-01-17 NOTE — Progress Notes (Signed)
TR BAND REMOVAL  LOCATION:    right radial  DEFLATED PER PROTOCOL:    Yes.    TIME BAND OFF / DRESSING APPLIED:    22:30   SITE UPON ARRIVAL:    Level 0  SITE AFTER BAND REMOVAL:    Level 0  REVERSE ALLEN'S TEST:     positive  CIRCULATION SENSATION AND MOVEMENT:    Within Normal Limits   Yes.    COMMENTS:

## 2014-01-17 NOTE — H&P (View-Only) (Signed)
Patient ID: Ana [name], female   DOB: July 29, 1931, 80 y.o.   MRN: [id number]    Subjective:  Denies CP or dyspnea this morning.  INR 1.65 today.  Urine culture with E coli. She is in rate-controlled atrial fibrillation today.   Scheduled Meds: . antiseptic oral rinse  15 mL Mouth Rinse BID  . aspirin EC  81 mg Oral Daily  . cefTRIAXone (ROCEPHIN)  IV  1 g Intravenous Q24H  . insulin aspart  0-15 Units Subcutaneous TID WC  . losartan  100 mg Oral Daily  . metoprolol  25 mg Oral BID  . sodium chloride  3 mL Intravenous Q12H   Continuous Infusions: . sodium chloride 50 mL/hr at 01/17/14 0548  . heparin 900 Units/hr (01/17/14 0003)   PRN Meds:.sodium chloride, acetaminophen, nitroGLYCERIN, ondansetron (ZOFRAN) IV, sodium chloride    Objective:  Filed Vitals:   01/17/14 0400 01/17/14 0404 01/17/14 0505 01/17/14 0731  BP: 115/38 115/38  138/44  Pulse: 62   108  Temp:  98.3 F (36.8 C)  98 F (36.7 C)  TempSrc:  Oral  Oral  Resp: 16   13  Height:      Weight:   158 lb 6.4 oz (71.85 kg)   SpO2: 93% 95%  99%    Intake/Output from previous day:  Intake/Output Summary (Last 24 hours) at 01/17/14 7619 Last data filed at 01/17/14 0700  Gross per 24 hour  Intake  975.6 ml  Output    800 ml  Net  175.6 ml    Physical Exam: Physical exam: Well-developed well-nourished in no acute distress.  Skin is warm and dry.  HEENT is normal.  Neck is supple, JVP 8 cm. Chest is clear to auscultation with normal expansion.  Cardiovascular exam is irregular rate and rhythm, no murmur.  Abdominal exam nontender or distended. No masses palpated. Extremities show no edema. neuro grossly intact  Lab Results: Basic Metabolic Panel:  Recent Labs  01/14/14 1645  01/16/14 0255 01/17/14 0255  NA 138  < > 134* 137  K 4.2  < > 3.7 3.9  CL 97  < > 95* 96  CO2 28  < > 26 26  GLUCOSE 132*  < > 149* 144*  BUN 12  < > 24* 23  CREATININE 0.81  < > 1.30* 1.05  CALCIUM 9.4  < > 8.4 8.6  MG  1.6  --   --   --   < > = values in this interval not displayed. CBC:  Recent Labs  01/16/14 0255 01/17/14 0255  WBC 6.8 5.9  HGB 10.4* 10.6*  HCT 31.6* 31.5*  MCV 87.5 87.5  PLT 247 241   Cardiac Enzymes:  Recent Labs  01/14/14 1645 01/14/14 2122 01/15/14 0225  TROPONINI 4.89* 4.01* 3.61*     Assessment/Plan:  1. NSTEMI: Patient has ruled in with troponin peak 4.8. Echo with EF 50%, mid to apical anteroseptal hypokinesis.  Continue aspirin, heparin and beta blocker. Intolerant to statins. Plan cardiac catheterization: INR 1.65 today so should be able to do procedure today. The risks and benefits were discussed and the patient agrees to proceed. 2. Acute diastolic CHF: Lasix on hold for LHC today. 3. Paroxysmal atrial fibrillation: Patient is in atrial fibrillation today, she is rate-controlled. Continue metoprolol. Resume Coumadin once cardiac catheterization plus +/- revascularization complete.  Will need to be on coumadin long-term so would favor Plavix if stent needed.  4. HTN: BP stable.  5. UTI: E coli,  starting ceftriaxone.   Loralie Champagne 01/17/2014, 9:38 AM

## 2014-01-17 NOTE — CV Procedure (Signed)
Ana [name] is a 80 y.o. female   [id number]  [id number] LOCATION:  FACILITY: [facility]  PHYSICIAN: Troy Sine, MD, [facility] 07/04/1931   DATE OF PROCEDURE:  01/17/2014     CARDIAC CATHETERIZATION/PERCUTANEOUS CORONARY INTERVENTION    HISTORY:  Ana [name] is a 80 year old female, who has a history of diabetes mellitus, hypertension, paroxysmal atrial fibrillation, pulmonary hypertension with possible mild mitral stenosis who presented with increasing dyspnea.  Coumadin has been held.  Troponins are positive for a non-ST segment elevation MI.  She now presents for cardiac catheterization.   PROCEDURE: Left heart catheterization via the right radial artery approach: Coronary angiography, left ventriculography.  Two-vessel percutaneous coronary intervention with PTCA/bare-metal stenting of the proximal LAD, and PTCA/bare-metal stenting of the proximal left circumflex coronary artery.  The patient was brought to the cath lab after I had gone to see her in the coronary care unit for further discussion of her cardiac catheterization. The patient was premedicated with Versed 1 mg and fentanyl 25 mcg. A right radial approach was utilized after an Allen's test verified adequate circulation. The right radial artery was punctured via the Seldinger technique, and a 6 [place] Glidesheath Slender was inserted without difficulty.  A radial cocktail consisting of Verapamil, IV nitroglycerin, and lidocaine was administered. Weight adjusted heparin was administered. A safety J wire was advanced into the ascending aorta. Diagnostic catheterization was done with 5 [place] JR 4 and JL 3.5 catheters. A 5 French pigtail catheter was used for left ventriculography.  At this point, I broke scrub and reviewed the angiographic findings with the patient.  I discussed therapeutic options, including CABG revascularization surgery, versus a PCI to her focal lesions involving her proximal LAD, proximal circumflex, and mid RCA.  I  also went back to the coronary care unit and discussed all options with the patient's husband, her son, and daughter-in-law.  Consensus of family members was not to pursue CABG surgery at this time, but to go ahead with PCI involving the LAD and circumflex today with plans for staged PCI to the RCA.  The patient was given an additional radial cocktail.  A 6 French XB LAD 3.5, was used as a guiding cath.  Angiomax bolus plus infusion was administered.  The patient received 600 mg of oral Plavix.  Attention was initially directed at the 95% proximal LAD stenosis.  A 2.5x15 mm Euphora balloon was inserted.  Then 3 dilatations at 5, 10, 11 atmospheres were made.  With the patient's history of PAF, and need for long-term Coumadin therapy, decision was made to use bare-metal stents.  A MultiLink 3.5x18 mm vision stent was then inserted and deployed at 10 and 11 atmospheres.  A [place] Euphora 3.75x15 mm balloon was used to post and dilatation up to 3.76 mm.  Scout angiography confirmed the excellent angiographic result.  Attention was then directed at the left circumflex coronary artery.  The same wire was advanced down the circumflex beyond the stenosis.  The same 2.5x15 mm Euphora balloon was used for initial predilatation at 10 atmospheres.  A 3.5x15 mm MultiLink vision bare-metal stent was then deployed at 11 and 12 atmospheres and post-stent dilatation was done with the same.  [place] Euphora 3.75x15 mm balloon, dilated up to 15 atmospheres corresponding to 3.78 mm size.  Scout angiography confirmed the excellent angiographic result.  The patient tolerated the procedure well.  All catheters were removed the patient.  The TR band was applied at 1730 and 15 cc of air.  She  left the catheterization laboratory, chest pain-free with stable hemodynamics.   HEMODYNAMICS:   Central Aorta: 144/53  Left Ventricle: 144/17  ANGIOGRAPHY:   The left main coronary artery was angiographically normal and bifurcated into the LAD and  left circumflex coronary artery.   The LAD was a large vessel.  There were proximal 95% stenosis proximal to the region of the first small diagonal vessel.  There was a 70% stenosis in the proximal portion of the second diagonal vessel.  Otherwise, the LAD was free of significant disease and extended to the LV apex.  The left circumflex coronary artery was a large vessel and had 85% eccentric proximal stenosis. There was 40% smooth stenosis after the OM vessel.  There is an additional distal marginal vessel and small posterolateral-like vessel.  The RCA was a large vessel that had 95% stenosis in the midsegment before a prominent marginal branch.  This appeared to be somewhat ulcerated.  The marginal vessel was large caliber and extended distally and collateralize the PDA vessel retrograde.  The RCA was occluded at the acute margin prior to the PDA takeoff.  In addition to the collaterals from the right coronary artery, the distal RCA was also collateralized from the left coronary system.  Left ventriculography revealed an ejection fraction of approximately 45%.  There was mid to basal inferior severe hypocontractility.   IMPRESSION:  Severe multivessel CAD with 95% proximal LAD stenosis, 70% stenosis in the proximal portion of the second diagonal branch, 85% stenosis in a large left circumflex coronary artery with 40% distal stenosis, and 95% stenosis in the mid RCA with total occlusion of the distal RCA in the region of the acute margin proximal to the PDA with collateralization to the PDA from the marginal branch, as well as collaterals to the distal RCA via left coronary system.  Mild/moderate LV dysfunction with severe hypokinesis of the mid to basal inferior wall.  Successful 2 vessel percutaneous coronary intervention with PTCA/bare-metal stenting of the LAD with insertion of a 3.5x18 mm MultiLink vision stent post dilated to 3.76 mm and a 95% stenosis being reduced to 0, and PTCA/bare-metal  stenting of the 85% circumflex stenosis with insertion of a 3.5x15 mm MultiLink vision stent post dilated to 3.7, 8 mm with the stenosis being reduced to 0%.  RECOMMENDATION:  It appears that the total occlusion of the distal RCA may be chronic due to the presence of right and left to right.collaterals.  The mid RCA lesion does appear to be ulcerated.  The patient will be restarted on heparin and hydrated.  Tentative plans will be to perform PCI of her mid RCA in approximately 2 days.  She is in sinus rhythm and will need aspirin and Plavix initially  With plans for long term Coumadin therapy in light of her PAF.   Troy Sine, MD, [facility] 01/17/2014 5:38 PM

## 2014-01-17 NOTE — Care Management Note (Addendum)
    Page 1 of 1   01/19/2014     3:57:58 PM CARE MANAGEMENT NOTE 01/19/2014  Patient:  Ana [name], Ana [name]   Account Number:  [id number]  Date Initiated:  01/17/2014  Documentation initiated by:  Elissa Hefty  Subjective/Objective Assessment:   adm w mi     Action/Plan:   lives w husband   Anticipated DC Date:  01/20/2014   Anticipated DC Plan:  [place]  CM consult      Choice offered to / List presented to:             Status of service:  In process, will continue to follow Medicare Important Message given?  YES (If response is "NO", the following Medicare IM given date fields will be blank) Date Medicare IM given:  01/17/2014 Medicare IM given by:  Elissa Hefty Date Additional Medicare IM given:  01/19/2014 Additional Medicare IM given by:  [place]  Discharge Disposition:  HOME/SELF CARE  Per UR Regulation:  Reviewed for med. necessity/level of care/duration of stay  If discussed at [place] of Stay Meetings, dates discussed:    Comments:  01-19-14 3:50pm Luz Lex, [place] - [phone]  [phone] Lives with husband in [place].  Son lives close. Have walker and rolator at home.  They do not drive but facility or son takes them where they need to go.  No other needs identified.

## 2014-01-17 NOTE — Progress Notes (Signed)
[place] for Heparin  Indication: chest pain/ACS and atrial fibrillation  Allergies  Allergen Reactions  . Welchol [Colesevelam Hcl] Nausea Only  . Zetia [Ezetimibe] Other (See Comments)    Edema   . Crestor [Rosuvastatin] Other (See Comments)    Unknown  . Zocor [Simvastatin] Other (See Comments)    Muscle weakness    Patient Measurements: Height: 5\' 2"  (157.5 cm) Weight: 158 lb 6.4 oz (71.85 kg) IBW/kg (Calculated) : 50.1 Heparin Dosing Weight: 66 kg  Vital Signs: Temp: 98.2 F (36.8 C) (07/28 1118) Temp src: Oral (07/28 1118) BP: 138/67 mmHg (07/28 1118) Pulse Rate: 84 (07/28 1118)  Labs:  Recent Labs  01/14/14 1645 01/14/14 2122  01/15/14 0225  01/16/14 0255 01/16/14 1245 01/17/14 0255  HGB  --   --   < > 10.6*  --  10.4*  --  10.6*  HCT  --   --   --  32.0*  --  31.6*  --  31.5*  PLT  --   --   --  252  --  247  --  241  LABPROT 20.6*  --   --  21.1*  --  22.5* 21.5* 19.5*  INR 1.77*  --   --  1.82*  --  1.98* 1.87* 1.65*  HEPARINUNFRC  --   --   --  0.17*  < > 0.49 0.84* 0.56  CREATININE 0.81  --   --  1.04  --  1.30*  --  1.05  TROPONINI 4.89* 4.01*  --  3.61*  --   --   --   --   < > = values in this interval not displayed.  Estimated Creatinine Clearance: 40.3 ml/min (by C-G formula based on Cr of 1.05).  . sodium chloride 50 mL/hr at 01/17/14 1300  . heparin 900 Units/hr (01/17/14 0003)     Assessment: 80 y/o F with NSTEMI, h/o Afib and Coumadin on hold, INR rising slightly, last dose of Coumadin 7/25.    INR 1.65  AM heparin level therapeutic on 900 units/hr. Heparin currently off for cath. Will follow up post cath for possible warfarin restart.  Goal of Therapy:  Heparin level 0.3-0.7 units/ml Monitor platelets by anticoagulation protocol: Yes   Plan:  Follow up post-cath for [facility] plan  Erin Hearing PharmD., BCPS Clinical Pharmacist Pager [phone] 01/17/2014 3:20 PM

## 2014-01-17 NOTE — Interval H&P Note (Signed)
Cath Lab Visit (complete for each Cath Lab visit)  Clinical Evaluation Leading to the Procedure:   ACS: Yes.    Non-ACS:    Anginal Classification: CCS IV  Anti-ischemic medical therapy: Maximal Therapy (2 or more classes of medications)  Non-Invasive Test Results: No non-invasive testing performed  Prior CABG: No previous CABG      History and Physical Interval Note:  01/17/2014 2:43 PM  Ana [name]  has presented today for surgery, with the diagnosis of chest pain  The various methods of treatment have been discussed with the patient and family. After consideration of risks, benefits and other options for treatment, the patient has consented to  Procedure(s): LEFT HEART CATHETERIZATION WITH CORONARY ANGIOGRAM (N/A) as a surgical intervention .  The patient's history has been reviewed, patient examined, no change in status, stable for surgery.  I have reviewed the patient's chart and labs.  Questions were answered to the patient's satisfaction.     KELLY,THOMAS A

## 2014-01-17 NOTE — Progress Notes (Addendum)
[place] for Heparin  Indication: chest pain/ACS and atrial fibrillation  Allergies  Allergen Reactions  . Welchol [Colesevelam Hcl] Nausea Only  . Zetia [Ezetimibe] Other (See Comments)    Edema   . Crestor [Rosuvastatin] Other (See Comments)    Unknown  . Zocor [Simvastatin] Other (See Comments)    Muscle weakness    Patient Measurements: Height: 5\' 2"  (157.5 cm) Weight: 158 lb 6.4 oz (71.85 kg) IBW/kg (Calculated) : 50.1 Heparin Dosing Weight: 66 kg  Vital Signs: Temp: 98.2 F (36.8 C) (07/28 1118) Temp src: Oral (07/28 1118) BP: 138/67 mmHg (07/28 1118) Pulse Rate: 70 (07/28 1429)  Labs:  Recent Labs  01/14/14 2122  01/15/14 0225  01/16/14 0255 01/16/14 1245 01/17/14 0255  HGB  --   < > 10.6*  --  10.4*  --  10.6*  HCT  --   --  32.0*  --  31.6*  --  31.5*  PLT  --   --  252  --  247  --  241  LABPROT  --   < > 21.1*  --  22.5* 21.5* 19.5*  INR  --   < > 1.82*  --  1.98* 1.87* 1.65*  HEPARINUNFRC  --   --  0.17*  < > 0.49 0.84* 0.56  CREATININE  --   --  1.04  --  1.30*  --  1.05  TROPONINI 4.01*  --  3.61*  --   --   --   --   < > = values in this interval not displayed.  Estimated Creatinine Clearance: 40.3 ml/min (by C-G formula based on Cr of 1.05).  . sodium chloride 50 mL/hr at 01/17/14 1300  . sodium chloride       Assessment: 80 y/o F with NSTEMI, h/o Afib and Coumadin on hold, INR rising slightly, last dose of Coumadin 7/25.    INR 1.65  AM heparin level therapeutic on 900 units/hr. Heparin then turned off for cath.  Pharmacy asked to resume IV heparin 8 hrs after radial sheath pull.  (removed at 1710 PM).   Goal of Therapy:  Heparin level 0.3-0.7 units/ml Monitor platelets by anticoagulation protocol: Yes   Plan:  1. Restart IV heparin 7/29 at 0630 AM at previous rate of 900 units/hr. 2. Will check heparin level 6 hrs after gtt resumed. 3. Daily heparin level and CBC. 4. F/u plans to resume  Coumadin.  Uvaldo Rising, BCPS  Clinical Pharmacist Pager [phone]  01/17/2014 6:17 PM

## 2014-01-17 NOTE — Progress Notes (Signed)
Patient ID: Ana [name], female   DOB: 1932-01-22, 80 y.o.   MRN: [id number]    Subjective:  Denies CP or dyspnea this morning.  INR 1.65 today.  Urine culture with E coli. She is in rate-controlled atrial fibrillation today.   Scheduled Meds: . antiseptic oral rinse  15 mL Mouth Rinse BID  . aspirin EC  81 mg Oral Daily  . cefTRIAXone (ROCEPHIN)  IV  1 g Intravenous Q24H  . insulin aspart  0-15 Units Subcutaneous TID WC  . losartan  100 mg Oral Daily  . metoprolol  25 mg Oral BID  . sodium chloride  3 mL Intravenous Q12H   Continuous Infusions: . sodium chloride 50 mL/hr at 01/17/14 0548  . heparin 900 Units/hr (01/17/14 0003)   PRN Meds:.sodium chloride, acetaminophen, nitroGLYCERIN, ondansetron (ZOFRAN) IV, sodium chloride    Objective:  Filed Vitals:   01/17/14 0400 01/17/14 0404 01/17/14 0505 01/17/14 0731  BP: 115/38 115/38  138/44  Pulse: 62   108  Temp:  98.3 F (36.8 C)  98 F (36.7 C)  TempSrc:  Oral  Oral  Resp: 16   13  Height:      Weight:   158 lb 6.4 oz (71.85 kg)   SpO2: 93% 95%  99%    Intake/Output from previous day:  Intake/Output Summary (Last 24 hours) at 01/17/14 8250 Last data filed at 01/17/14 0700  Gross per 24 hour  Intake  975.6 ml  Output    800 ml  Net  175.6 ml    Physical Exam: Physical exam: Well-developed well-nourished in no acute distress.  Skin is warm and dry.  HEENT is normal.  Neck is supple, JVP 8 cm. Chest is clear to auscultation with normal expansion.  Cardiovascular exam is irregular rate and rhythm, no murmur.  Abdominal exam nontender or distended. No masses palpated. Extremities show no edema. neuro grossly intact  Lab Results: Basic Metabolic Panel:  Recent Labs  01/14/14 1645  01/16/14 0255 01/17/14 0255  NA 138  < > 134* 137  K 4.2  < > 3.7 3.9  CL 97  < > 95* 96  CO2 28  < > 26 26  GLUCOSE 132*  < > 149* 144*  BUN 12  < > 24* 23  CREATININE 0.81  < > 1.30* 1.05  CALCIUM 9.4  < > 8.4 8.6  MG  1.6  --   --   --   < > = values in this interval not displayed. CBC:  Recent Labs  01/16/14 0255 01/17/14 0255  WBC 6.8 5.9  HGB 10.4* 10.6*  HCT 31.6* 31.5*  MCV 87.5 87.5  PLT 247 241   Cardiac Enzymes:  Recent Labs  01/14/14 1645 01/14/14 2122 01/15/14 0225  TROPONINI 4.89* 4.01* 3.61*     Assessment/Plan:  1. NSTEMI: Patient has ruled in with troponin peak 4.8. Echo with EF 50%, mid to apical anteroseptal hypokinesis.  Continue aspirin, heparin and beta blocker. Intolerant to statins. Plan cardiac catheterization: INR 1.65 today so should be able to do procedure today. The risks and benefits were discussed and the patient agrees to proceed. 2. Acute diastolic CHF: Lasix on hold for LHC today. 3. Paroxysmal atrial fibrillation: Patient is in atrial fibrillation today, she is rate-controlled. Continue metoprolol. Resume Coumadin once cardiac catheterization plus +/- revascularization complete.  Will need to be on coumadin long-term so would favor Plavix if stent needed.  4. HTN: BP stable.  5. UTI: E coli,  starting ceftriaxone.   Loralie Champagne 01/17/2014, 9:38 AM

## 2014-01-18 LAB — CBC
HCT: 31.2 % — ABNORMAL LOW (ref 36.0–46.0)
Hemoglobin: 10.3 g/dL — ABNORMAL LOW (ref 12.0–15.0)
MCH: 28.9 pg (ref 26.0–34.0)
MCHC: 33 g/dL (ref 30.0–36.0)
MCV: 87.6 fL (ref 78.0–100.0)
Platelets: 245 10*3/uL (ref 150–400)
RBC: 3.56 MIL/uL — ABNORMAL LOW (ref 3.87–5.11)
RDW: 13.9 % (ref 11.5–15.5)
WBC: 4.9 10*3/uL (ref 4.0–10.5)

## 2014-01-18 LAB — PROTIME-INR
INR: 1.56 — AB (ref 0.00–1.49)
Prothrombin Time: 18.7 seconds — ABNORMAL HIGH (ref 11.6–15.2)

## 2014-01-18 LAB — BASIC METABOLIC PANEL
ANION GAP: 14 (ref 5–15)
BUN: 17 mg/dL (ref 6–23)
CHLORIDE: 103 meq/L (ref 96–112)
CO2: 23 mEq/L (ref 19–32)
Calcium: 8.3 mg/dL — ABNORMAL LOW (ref 8.4–10.5)
Creatinine, Ser: 1.05 mg/dL (ref 0.50–1.10)
GFR calc non Af Amer: 49 mL/min — ABNORMAL LOW (ref >90)
GFR, EST AFRICAN AMERICAN: 57 mL/min — AB (ref >90)
Glucose, Bld: 144 mg/dL — ABNORMAL HIGH (ref 70–99)
POTASSIUM: 4.1 meq/L (ref 3.7–5.3)
Sodium: 140 mEq/L (ref 137–147)

## 2014-01-18 LAB — GLUCOSE, CAPILLARY
GLUCOSE-CAPILLARY: 141 mg/dL — AB (ref 70–99)
GLUCOSE-CAPILLARY: 157 mg/dL — AB (ref 70–99)
Glucose-Capillary: 166 mg/dL — ABNORMAL HIGH (ref 70–99)
Glucose-Capillary: 176 mg/dL — ABNORMAL HIGH (ref 70–99)

## 2014-01-18 LAB — HEPARIN LEVEL (UNFRACTIONATED)
Heparin Unfractionated: 0.25 IU/mL — ABNORMAL LOW (ref 0.30–0.70)
Heparin Unfractionated: 0.57 IU/mL (ref 0.30–0.70)

## 2014-01-18 MED ORDER — ASPIRIN 81 MG PO CHEW
81.0000 mg | CHEWABLE_TABLET | ORAL | Status: AC
  Filled 2014-01-18: qty 1

## 2014-01-18 MED FILL — Sodium Chloride IV Soln 0.9%: INTRAVENOUS | Qty: 50 | Status: AC

## 2014-01-18 NOTE — Progress Notes (Signed)
[facility] for Heparin  Indication: chest pain/ACS and atrial fibrillation  Allergies  Allergen Reactions  . Welchol [Colesevelam Hcl] Nausea Only  . Zetia [Ezetimibe] Other (See Comments)    Edema   . Crestor [Rosuvastatin] Other (See Comments)    Unknown  . Zocor [Simvastatin] Other (See Comments)    Muscle weakness    Patient Measurements: Height: 5\' 2"  (157.5 cm) Weight: 164 lb 0.4 oz (74.4 kg) IBW/kg (Calculated) : 50.1 Heparin Dosing Weight: 66 kg  Vital Signs: Temp: 98 F (36.7 C) (07/29 1200) Temp src: Oral (07/29 1200) BP: 130/47 mmHg (07/29 1200) Pulse Rate: 60 (07/29 1200)  Labs:  Recent Labs  01/16/14 0255 01/16/14 1245 01/17/14 0255 01/18/14 0536 01/18/14 1230  HGB 10.4*  --  10.6* 10.3*  --   HCT 31.6*  --  31.5* 31.2*  --   PLT 247  --  241 245  --   LABPROT 22.5* 21.5* 19.5* 18.7*  --   INR 1.98* 1.87* 1.65* 1.56*  --   HEPARINUNFRC 0.49 0.84* 0.56  --  0.25*  CREATININE 1.30*  --  1.05 1.05  --     Estimated Creatinine Clearance: 41 ml/min (by C-G formula based on Cr of 1.05).  . sodium chloride Stopped (01/17/14 1900)  . sodium chloride 75 mL/hr at 01/18/14 0300  . heparin 900 Units/hr (01/18/14 8563)    Assessment: 80 y/o F with NSTEMI, h/o Afib and Coumadin on hold, INR down to 1.5 this am, last dose of Coumadin 7/25.    AM heparin level subtherapeutic on 900 units/hr. Heparin restarted yesterday post-cath. Will increase rate to 1050/hr and recheck level tonight. Plan for staged PCI in am. Coumadin to restart post cath.  Goal of Therapy:  Heparin level 0.3-0.7 units/ml Monitor platelets by anticoagulation protocol: Yes   Plan:  1. Increase heparin to 1050 units/hr 2. Will recheck heparin level 8 hrs  3. Daily heparin level and CBC. 4. F/u plans to resume Coumadin post cath.  Erin Hearing PharmD., BCPS Clinical Pharmacist Pager [phone] 01/18/2014 3:05 PM

## 2014-01-18 NOTE — Progress Notes (Deleted)
Received a call from lab that pt is + MRSA swab, placed on contact isolation.

## 2014-01-18 NOTE — Progress Notes (Signed)
Patient ID: Ana [name], female   DOB: Sep 02, 1931, 80 y.o.   MRN: [id number]    Subjective:   Underwent cath 7/28 with 3v CAD. Had PCI with BMS of LAD and LCX  LM: normal LAD: 95% prox. d2 75% LCx 85% prox 85% There was 40% smooth stenosis after the OM vessel. There is an additional distal marginal vessel and small posterolateral-like vessel.  RCA: Ulcerated 95% mid. Then occluded at the acute margin prior to the PDA takeoff. In addition to the collaterals from the right coronary artery, the distal RCA was also collateralized from the left coronary system.  Left ventriculography revealed an ejection fraction of approximately 45%. There was mid to basal inferior severe hypocontractility.  Underwent 2v PCI yesterday. Scheduled for staged PCI of mRCA tomorrow per Dr. Claiborne Billings.   Feels great. Walking with CR. Back in NSR  Scheduled Meds: . antiseptic oral rinse  15 mL Mouth Rinse BID  . [START ON 01/19/2014] aspirin  81 mg Oral Pre-Cath  . aspirin EC  81 mg Oral Daily  . aspirin EC  81 mg Oral Daily  . cefTRIAXone (ROCEPHIN)  IV  1 g Intravenous Q24H  . clopidogrel  75 mg Oral Q breakfast  . insulin aspart  0-15 Units Subcutaneous TID WC  . losartan  100 mg Oral Daily  . metoprolol  25 mg Oral BID   Continuous Infusions: . sodium chloride Stopped (01/17/14 1900)  . sodium chloride Stopped (01/18/14 1442)  . heparin 900 Units/hr (01/18/14 4008)   PRN Meds:.acetaminophen, acetaminophen, nitroGLYCERIN, ondansetron (ZOFRAN) IV    Objective:  Filed Vitals:   01/18/14 0500 01/18/14 0600 01/18/14 0808 01/18/14 1200  BP: 118/38 114/33 124/35 130/47  Pulse: 65 61 60 60  Temp:   98.2 F (36.8 C) 98 F (36.7 C)  TempSrc:   Oral Oral  Resp: 17 19 13 13   Height:      Weight: 74.4 kg (164 lb 0.4 oz)     SpO2: 94% 90% 95% 97%    Intake/Output from previous day:  Intake/Output Summary (Last 24 hours) at 01/18/14 1448 Last data filed at 01/18/14 1400  Gross per 24 hour  Intake   2502  ml  Output    750 ml  Net   1752 ml    Physical Exam: Physical exam: Well-developed well-nourished in no acute distress.  Skin is warm and dry.  HEENT is normal.  Neck is supple, JVP 6 cm. Chest is clear to auscultation with normal expansion.  Cardiovascular exam regular rate and rhythm, no murmur.  Abdominal exam nontender or distended. No masses palpated. Extremities show no edema. neuro grossly intact  Lab Results: Basic Metabolic Panel:  Recent Labs  01/17/14 0255 01/18/14 0536  NA 137 140  K 3.9 4.1  CL 96 103  CO2 26 23  GLUCOSE 144* 144*  BUN 23 17  CREATININE 1.05 1.05  CALCIUM 8.6 8.3*   CBC:  Recent Labs  01/17/14 0255 01/18/14 0536  WBC 5.9 4.9  HGB 10.6* 10.3*  HCT 31.5* 31.2*  MCV 87.5 87.6  PLT 241 245   Cardiac Enzymes: No results found for this basename: CKTOTAL, CKMB, CKMBINDEX, TROPONINI,  in the last 72 hours   Assessment/Plan:  1. NSTEMI: 3v CAD on cath. S/p BMS to LAD and LCX on 7/28. For staged PCI of RCA tomorrow.    --continue asa, plavix, statin, b-blocker  2. Acute diastolic CHF: improved/ lasix on hold for pci.  3. Paroxysmal atrial fibrillation:  Back in NSR. Resume Coumadin once cardiac catheterization plus +/- revascularization complete.  Once coumadin is reloaded would treat with Plavix/warfain and drop ASA 4. HTN: BP stable.  5. UTI: E coli, on ceftriaxone x 3 days.   Glori Bickers MD 01/18/2014, 2:48 PM

## 2014-01-18 NOTE — Progress Notes (Signed)
CARDIAC REHAB PHASE I   PRE:  Rate/Rhythm: 60 SR    BP: sitting 116/32    SaO2: 100 RA  MODE:  Ambulation: 270 ft   POST:  Rate/Rhythm: 75 SR    BP: sitting 146/53     SaO2: 100 RA  Pt somewhat weak. Normally uses a cane or RW at her senior living center. To BR then walked with RW. Veered right entire walk, blamed this on the RW. No other c/o. Return to bed for nap. Gave MI book and diet. Sts she eats too many sweets where she lives (they provide meals). Will f/u. [phone]  Ana [name] [place] CES, ACSM 01/18/2014 3:11 PM

## 2014-01-19 ENCOUNTER — Encounter (HOSPITAL_COMMUNITY)
Admission: EM | Disposition: A | Discharge status: Home / Self Care | Payer: Self-pay | Source: Home / Self Care | Attending: Cardiology

## 2014-01-19 HISTORY — PX: PERCUTANEOUS CORONARY STENT INTERVENTION (PCI-S): SHX5485

## 2014-01-19 LAB — HEPARIN LEVEL (UNFRACTIONATED): Heparin Unfractionated: 0.47 IU/mL (ref 0.30–0.70)

## 2014-01-19 LAB — CBC
HCT: 29.1 % — ABNORMAL LOW (ref 36.0–46.0)
HEMOGLOBIN: 9.7 g/dL — AB (ref 12.0–15.0)
MCH: 29.6 pg (ref 26.0–34.0)
MCHC: 33.3 g/dL (ref 30.0–36.0)
MCV: 88.7 fL (ref 78.0–100.0)
Platelets: 239 10*3/uL (ref 150–400)
RBC: 3.28 MIL/uL — ABNORMAL LOW (ref 3.87–5.11)
RDW: 14.2 % (ref 11.5–15.5)
WBC: 7.1 10*3/uL (ref 4.0–10.5)

## 2014-01-19 LAB — GLUCOSE, CAPILLARY
GLUCOSE-CAPILLARY: 137 mg/dL — AB (ref 70–99)
Glucose-Capillary: 151 mg/dL — ABNORMAL HIGH (ref 70–99)
Glucose-Capillary: 150 mg/dL — ABNORMAL HIGH (ref 70–99)
Glucose-Capillary: 169 mg/dL — ABNORMAL HIGH (ref 70–99)
Glucose-Capillary: 175 mg/dL — ABNORMAL HIGH (ref 70–99)

## 2014-01-19 LAB — POCT ACTIVATED CLOTTING TIME: Activated Clotting Time: 287 seconds

## 2014-01-19 LAB — PROTIME-INR
INR: 1.51 — AB (ref 0.00–1.49)
Prothrombin Time: 18.2 seconds — ABNORMAL HIGH (ref 11.6–15.2)

## 2014-01-19 SURGERY — PERCUTANEOUS CORONARY STENT INTERVENTION (PCI-S)
Anesthesia: LOCAL

## 2014-01-19 MED ORDER — SODIUM CHLORIDE 0.9 % IV SOLN: 250.0000 mL | INTRAVENOUS | Status: DC | PRN

## 2014-01-19 MED ORDER — WARFARIN SODIUM 2.5 MG PO TABS
2.5000 mg | ORAL_TABLET | Freq: Once | ORAL | Status: AC
  Administered 2014-01-19: 2.5 mg via ORAL
  Filled 2014-01-19: qty 1

## 2014-01-19 MED ORDER — ASPIRIN 81 MG PO CHEW
81.0000 mg | CHEWABLE_TABLET | ORAL | Status: AC
  Administered 2014-01-19: 81 mg via ORAL

## 2014-01-19 MED ORDER — FENTANYL CITRATE 0.05 MG/ML IJ SOLN
INTRAMUSCULAR | Status: AC
  Filled 2014-01-19: qty 2

## 2014-01-19 MED ORDER — ASPIRIN 81 MG PO CHEW: 81.0000 mg | CHEWABLE_TABLET | Freq: Every day | ORAL | Status: DC

## 2014-01-19 MED ORDER — WARFARIN - PHARMACIST DOSING INPATIENT
Freq: Every day | Status: DC
Start: 2014-01-19 — End: 2014-01-20

## 2014-01-19 MED ORDER — SODIUM CHLORIDE 0.9 % IJ SOLN
3.0000 mL | Freq: Two times a day (BID) | INTRAMUSCULAR | Status: DC
Start: 2014-01-19 — End: 2014-01-19

## 2014-01-19 MED ORDER — ASPIRIN 81 MG PO CHEW
81.0000 mg | CHEWABLE_TABLET | Freq: Every day | ORAL | Status: DC
  Administered 2014-01-20: 81 mg via ORAL
  Filled 2014-01-19: qty 1

## 2014-01-19 MED ORDER — SODIUM CHLORIDE 0.9 % IJ SOLN: 3.0000 mL | INTRAMUSCULAR | Status: DC | PRN

## 2014-01-19 MED ORDER — SODIUM CHLORIDE 0.9 % IV SOLN: INTRAVENOUS | Status: DC

## 2014-01-19 MED ORDER — MIDAZOLAM HCL 2 MG/2ML IJ SOLN
INTRAMUSCULAR | Status: AC
Start: 2014-01-19 — End: 2014-01-19
  Filled 2014-01-19: qty 2

## 2014-01-19 MED ORDER — VERAPAMIL HCL 2.5 MG/ML IV SOLN
INTRAVENOUS | Status: AC
  Filled 2014-01-19: qty 2

## 2014-01-19 MED ORDER — NITROGLYCERIN 1 MG/10 ML FOR IR/CATH LAB
INTRA_ARTERIAL | Status: AC
  Filled 2014-01-19: qty 10

## 2014-01-19 MED ORDER — HEPARIN SODIUM (PORCINE) 1000 UNIT/ML IJ SOLN
INTRAMUSCULAR | Status: AC
  Filled 2014-01-19: qty 1

## 2014-01-19 NOTE — H&P (View-Only) (Signed)
Patient ID: Ana [name], female   DOB: 1931-07-12, 80 y.o.   MRN: [id number]    Subjective:   Underwent cath 7/28 with 3v CAD. Had PCI with BMS of LAD and LCX  LM: normal LAD: 95% prox. d2 75% LCx 85% prox 85% There was 40% smooth stenosis after the OM vessel. There is an additional distal marginal vessel and small posterolateral-like vessel.  RCA: Ulcerated 95% mid. Then occluded at the acute margin prior to the PDA takeoff. In addition to the collaterals from the right coronary artery, the distal RCA was also collateralized from the left coronary system.  Left ventriculography revealed an ejection fraction of approximately 45%. There was mid to basal inferior severe hypocontractility.  Underwent 2v PCI yesterday. Scheduled for staged PCI of mRCA tomorrow per Dr. Claiborne Billings.   Feels great. Walking with CR. Back in NSR  Scheduled Meds: . antiseptic oral rinse  15 mL Mouth Rinse BID  . [START ON 01/19/2014] aspirin  81 mg Oral Pre-Cath  . aspirin EC  81 mg Oral Daily  . aspirin EC  81 mg Oral Daily  . cefTRIAXone (ROCEPHIN)  IV  1 g Intravenous Q24H  . clopidogrel  75 mg Oral Q breakfast  . insulin aspart  0-15 Units Subcutaneous TID WC  . losartan  100 mg Oral Daily  . metoprolol  25 mg Oral BID   Continuous Infusions: . sodium chloride Stopped (01/17/14 1900)  . sodium chloride Stopped (01/18/14 1442)  . heparin 900 Units/hr (01/18/14 8937)   PRN Meds:.acetaminophen, acetaminophen, nitroGLYCERIN, ondansetron (ZOFRAN) IV    Objective:  Filed Vitals:   01/18/14 0500 01/18/14 0600 01/18/14 0808 01/18/14 1200  BP: 118/38 114/33 124/35 130/47  Pulse: 65 61 60 60  Temp:   98.2 F (36.8 C) 98 F (36.7 C)  TempSrc:   Oral Oral  Resp: 17 19 13 13   Height:      Weight: 74.4 kg (164 lb 0.4 oz)     SpO2: 94% 90% 95% 97%    Intake/Output from previous day:  Intake/Output Summary (Last 24 hours) at 01/18/14 1448 Last data filed at 01/18/14 1400  Gross per 24 hour  Intake   2502  ml  Output    750 ml  Net   1752 ml    Physical Exam: Physical exam: Well-developed well-nourished in no acute distress.  Skin is warm and dry.  HEENT is normal.  Neck is supple, JVP 6 cm. Chest is clear to auscultation with normal expansion.  Cardiovascular exam regular rate and rhythm, no murmur.  Abdominal exam nontender or distended. No masses palpated. Extremities show no edema. neuro grossly intact  Lab Results: Basic Metabolic Panel:  Recent Labs  01/17/14 0255 01/18/14 0536  NA 137 140  K 3.9 4.1  CL 96 103  CO2 26 23  GLUCOSE 144* 144*  BUN 23 17  CREATININE 1.05 1.05  CALCIUM 8.6 8.3*   CBC:  Recent Labs  01/17/14 0255 01/18/14 0536  WBC 5.9 4.9  HGB 10.6* 10.3*  HCT 31.5* 31.2*  MCV 87.5 87.6  PLT 241 245   Cardiac Enzymes: No results found for this basename: CKTOTAL, CKMB, CKMBINDEX, TROPONINI,  in the last 72 hours   Assessment/Plan:  1. NSTEMI: 3v CAD on cath. S/p BMS to LAD and LCX on 7/28. For staged PCI of RCA tomorrow.    --continue asa, plavix, statin, b-blocker  2. Acute diastolic CHF: improved/ lasix on hold for pci.  3. Paroxysmal atrial fibrillation:  Back in NSR. Resume Coumadin once cardiac catheterization plus +/- revascularization complete.  Once coumadin is reloaded would treat with Plavix/warfain and drop ASA 4. HTN: BP stable.  5. UTI: E coli, on ceftriaxone x 3 days.   Glori Bickers MD 01/18/2014, 2:48 PM

## 2014-01-19 NOTE — CV Procedure (Signed)
CARDIAC CATHETERIZATION AND PERCUTANEOUS CORONARY INTERVENTION REPORT  NAME:  Ana [name]   MRN: [id number] DOB:  1932/05/10   ADMIT DATE: 01/14/2014 Procedure Date: 01/19/2014  INTERVENTIONAL CARDIOLOGIST: Leonie Man, M.D., MS PRIMARY CARE PROVIDER: Pcp Not In System PRIMARY CARDIOLOGIST: Fransico Him  PATIENT:  Ana [name] is a 80 y.o. female with PMH of DHF admitted for ACS - initial cardiac cath revealed 3 V CAD (LAD, Cx & RCA that was100% distal occluded but retrograde fills via RV marginal artery that is jeopardized by an upstream ulcerated plaque).  At the time of her initial cath, she underwent PCI with BMS to LAD & Cx by Dr. Claiborne Billings -- BMS used due to need for long-term anticoagulation for PAF.   She now presents for staged complete revascularization with PCI to the mid RCA ulcerated 80-90% lesion. She is currently on Plavix & ASA with plan for initial ASA/Plavix with eventual addition of warfarin with Plavix & no ASA.  PRE-OPERATIVE DIAGNOSIS:    Acute Coronary Syndrome  Multivessel CAD.  PROCEDURES PERFORMED:    Left Heart Catheterization Right Radial Artery access  Percutaneous Coronary Intervention of the mid RCA with a MultiLink Vision BMS 2.75 mm x 18 mm - post-dilated to 3.0 mm.  PROCEDURE: The patient was brought to the [facility] in the fasting state and prepped and draped in the usual sterile fashion for Right Radial artery access. A modified Allen's test was performed on the Right wrist demonstrating excellent collateral flow for radial access.   Sterile technique was used including antiseptics, cap, gloves, gown, hand hygiene, mask and sheet. Skin prep: Chlorhexidine.   Consent: Risks of procedure as well as the alternatives and risks of each were explained to the (patient/caregiver). Consent for procedure obtained.   Time Out: Verified patient identification, verified procedure, site/side was marked, verified correct  patient position, special equipment/implants available, medications/allergies/relevent history reviewed, required imaging and test results available. Performed.  Access:    Right Radial Artery: 6 Fr Sheath -  modified Seldinger Technique (Angiocath Micropuncture Kit)  Radial Cocktail - 10 mL; IV Heparin 5500 Units (ACT 280 Sec)  Left Heart Catheterization: 6 Fr JR4 Guide Catheter advanced over a Versicore wire -- initially across the Aortic Valve for  LV Hemodynamics: 6Fr JR4 Guide Catheter  Right Coronary Artery Cineangiography: 6Fr JR4 Guide Catheter   Sheath removed in the Cath Lab with TR Band application for hemostasis.  TR Band: 0950  Hours; 15 mL air  FINDINGS:  Hemodynamics:   Central Aortic Pressure / Mean: 112/50/74 mmHg  Left Ventricular Pressure / LVEDP: 113/12/20 mmHg  Left Ventriculography: Deferred  Coronary Anatomy:  RCA: Large-caliber vessel that is 100% occluded distally.  Just prior to a large major artery marginal branch the provides right to right collateralization to the distal RCA system there is a ulcerated now 80-90% stenosis that crosses the takeoff of the artery marginal. This is a target lesion for today.  Percutaneous Coronary Intervention:  ACT checked to be greater than 250 Sec Guide: 6 Fr   JR 4 guide Guidewire: [place] -- crossed just beyond the distal occlusion site, but was in a very small branch therefore no attempt was made to revascularize beyond the occlusion  Predilation Balloon: Euphora 2.5 mm x 15 mm;   8 Atm x 20 Sec,  Stent: MultiLink Vision BMS 2.75 mm x 18 mm;   Max inflation: 16 Atm x 30 Sec  Final Diameter: 3.0 mm  Post deployment angiography in multiple views, with and without guidewire in place revealed excellent stent deployment and lesion coverage.  There was no evidence of dissection perforation.  MEDICATIONS:  Anesthesia:  Local Lidocaine 2 ml  Sedation:  1 mg IV Versed, 25 mcg IV fentanyl ;   Omnipaque Contrast:  80 ml  Anticoagulation:  IV Heparin 5500 Units ;   Anti-Platelet Agent:  On Plavix 75 mg daily Radial Cocktail: 5 mg Verapamil, 400 mcg NTG, 2 ml 2% Lidocaine in 10 ml NS  PATIENT DISPOSITION:    The patient was transferred to the PACU holding area in a hemodynamicaly stable, chest pain free condition.  The patient tolerated the procedure well, and there were no complications.  EBL:   < 5 ml  The patient was stable before, during, and after the procedure.  POST-OPERATIVE DIAGNOSIS:    Successful PCI of the mid RCA reducing a 80-90% stenosis to 0% with TIMI-3 flow restored to the distal vessel occlusion. Brisk TIMI-3 flow major RV marginal branch providing collaterals to the distal RCA system.  Relatively normal systolic pressures but moderately elevated LVEDP  PLAN OF CARE:  Return to TCU for postcatheterization care. Standard post radial Care with TR band removal.  Will defer decision as to when to initiate Coumadin therapy to the primary service.  Minor scanning was that we would initially use aspirin plus Plavix and not start Coumadin until later.  With 3 new bare-metal stents, would need at least one month of dual antiplatelet therapy after which my preference would be to stop aspirin and start Coumadin for A. Fib.  She does have an elevated LVEDP and made benefit from low-dose diuretic on discharge.  Anticipate discharge in the morning if stable.   Leonie Man, M.D., M.S. [facility] [address] [place], [place]  [address]  [phone]  01/19/2014 9:52 AM

## 2014-01-19 NOTE — Progress Notes (Signed)
Back from the cath lab awake and alert, right tr band in placed elevated with pillow, pulse ox to right thumb

## 2014-01-19 NOTE — Progress Notes (Signed)
Patient ID: Ana [name], female   DOB: [birth year], 80 y.o.   MRN: [id number]    Subjective:   Underwent cath 7/28 with 3v CAD. Had PCI with BMS of LAD and LCX Cath today with BMS to [facility].   LM: normal LAD: 95% prox. d2 75% LCx 85% prox 85% There was 40% smooth stenosis after the OM vessel. There is an additional distal marginal vessel and small posterolateral-like vessel.  RCA: Ulcerated 95% mid. Then occluded at the acute margin prior to the PDA takeoff. In addition to the collaterals from the right coronary artery, the distal RCA was also collateralized from the left coronary system.  Left ventriculography revealed an ejection fraction of approximately 45%. There was mid to basal inferior severe hypocontractility.  Underwent 2v PCI 7/29. Had staged PCI of mRCA today.  No chest pain or dyspnea, she is in NSR.   Scheduled Meds: . antiseptic oral rinse  15 mL Mouth Rinse BID  . clopidogrel  75 mg Oral Q breakfast  . insulin aspart  0-15 Units Subcutaneous TID WC  . losartan  100 mg Oral Daily  . metoprolol  25 mg Oral BID   Continuous Infusions: . sodium chloride Stopped (01/17/14 1900)  . sodium chloride Stopped (01/18/14 1442)  . sodium chloride 75 mL/hr (01/19/14 0345)   PRN Meds:.acetaminophen, acetaminophen, nitroGLYCERIN, ondansetron (ZOFRAN) IV    Objective:  Filed Vitals:   01/19/14 1015 01/19/14 1030 01/19/14 1045 01/19/14 1100  BP: 133/45 142/45 140/48 139/46  Pulse: 62 65 65 65  Temp:      TempSrc:      Resp: 17 18 17 18   Height:      Weight:      SpO2: 94% 91% 90% 91%    Intake/Output from previous day:  Intake/Output Summary (Last 24 hours) at 01/19/14 1158 Last data filed at 01/19/14 1100  Gross per 24 hour  Intake 998.25 ml  Output    825 ml  Net 173.25 ml    Physical Exam: Physical exam: Well-developed well-nourished in no acute distress.  Skin is warm and dry.  HEENT is normal.  Neck is supple, JVP 6 cm. Chest is clear to auscultation  with normal expansion.  Cardiovascular exam regular rate and rhythm, no murmur.  Abdominal exam nontender or distended. No masses palpated. Extremities show no edema. neuro grossly intact  Lab Results: Basic Metabolic Panel:  Recent Labs  01/17/14 0255 01/18/14 0536  NA 137 140  K 3.9 4.1  CL 96 103  CO2 26 23  GLUCOSE 144* 144*  BUN 23 17  CREATININE 1.05 1.05  CALCIUM 8.6 8.3*   CBC:  Recent Labs  01/18/14 0536 01/19/14 0339  WBC 4.9 7.1  HGB 10.3* 9.7*  HCT 31.2* 29.1*  MCV 87.6 88.7  PLT 245 239   Cardiac Enzymes: No results found for this basename: CKTOTAL, CKMB, CKMBINDEX, TROPONINI,  in the last 72 hours   Assessment/Plan:  1. NSTEMI: 3v CAD on cath. S/p BMS to LAD and LCX on 7/28 and BMS to [facility] 7/30.  EF 50% by echo. - Patient had 3 BMS.  Would plan ASA 81 + Plavix + coumadin INR 2-2.5 for 1 month then drop aspirin.  2. Acute diastolic CHF: Stable, looks euvolemic. 3. Paroxysmal atrial fibrillation: Back in NSR. Resume coumadin tonight goal INR 2-2.5.  4. HTN: BP stable.  5. UTI: E coli, on ceftriaxone x 3 days.   Home in am.   Loralie Champagne MD 01/19/2014, 11:58  AM

## 2014-01-19 NOTE — Progress Notes (Signed)
To the cath lab. Heparin stopped.

## 2014-01-19 NOTE — Progress Notes (Signed)
[place] for warfarin  Indication: afib  Allergies  Allergen Reactions  . Welchol [Colesevelam Hcl] Nausea Only  . Zetia [Ezetimibe] Other (See Comments)    Edema   . Crestor [Rosuvastatin] Other (See Comments)    Unknown  . Zocor [Simvastatin] Other (See Comments)    Muscle weakness    Patient Measurements: Height: 5\' 2"  (157.5 cm) Weight: 161 lb 13.1 oz (73.4 kg) IBW/kg (Calculated) : 50.1 Heparin Dosing Weight: 66 kg  Vital Signs: Temp: 98.3 F (36.8 C) (07/30 1200) Temp src: Oral (07/30 1200) BP: 92/74 mmHg (07/30 1200) Pulse Rate: 59 (07/30 1130)  Labs:  Recent Labs  01/17/14 0255 01/18/14 0536 01/18/14 1230 01/18/14 2312 01/19/14 0339  HGB 10.6* 10.3*  --   --  9.7*  HCT 31.5* 31.2*  --   --  29.1*  PLT 241 245  --   --  239  LABPROT 19.5* 18.7*  --   --  18.2*  INR 1.65* 1.56*  --   --  1.51*  HEPARINUNFRC 0.56  --  0.25* 0.57 0.47  CREATININE 1.05 1.05  --   --   --     Estimated Creatinine Clearance: 40.7 ml/min (by C-G formula based on Cr of 1.05).  . sodium chloride Stopped (01/17/14 1900)  . sodium chloride Stopped (01/18/14 1442)  . sodium chloride 75 mL/hr (01/19/14 0345)    Assessment: 80 y/o F with NSTEMI, h/o Afib and Coumadin on hold, INR down slightly to 1.5 this am, last dose of Coumadin 7/25.    AM heparin level subtherapeutic on 1050 units/hr. Heparin restarted 7/28 post-cath. S/p PCI to RCA this am, orders to resume warfarin tonight.  Home coumadin: Coumadin 2.5mg  daily x 1.25 on Sunday  Goal of Therapy:  Heparin level 0.3-0.7 units/ml Monitor platelets by anticoagulation protocol: Yes   Plan:  1. Warfarin 2.5mg  tonight 2. Daily INR  Erin Hearing PharmD., BCPS Clinical Pharmacist Pager [phone] 01/19/2014 12:33 PM

## 2014-01-19 NOTE — Interval H&P Note (Signed)
History and Physical Interval Note:  01/19/2014 9:00 AM  Ana [name]  has presented today for surgery, with the diagnosis of CAD with ACS.  The various methods of treatment have been discussed with the patient and family. After consideration of risks, benefits and other options for treatment, the patient has consented to  Procedure(s): PERCUTANEOUS CORONARY STENT INTERVENTION (PCI-S) (N/A) as a surgical intervention .  The patient's history has been reviewed, patient examined, no change in status, stable for surgery.  I have reviewed the patient's chart and labs.  Questions were answered to the patient's satisfaction.     HARDING,DAVID W  Cath Lab Visit (complete for each Cath Lab visit)  Clinical Evaluation Leading to the Procedure:   ACS: Yes.    Non-ACS:    Anginal Classification: CCS IV  Anti-ischemic medical therapy: Maximal Therapy (2 or more classes of medications)  Non-Invasive Test Results: No non-invasive testing performed  Prior CABG: No previous CABG

## 2014-01-19 NOTE — Progress Notes (Signed)
[facility] for Heparin  Indication: chest pain/ACS and atrial fibrillation  Allergies  Allergen Reactions  . Welchol [Colesevelam Hcl] Nausea Only  . Zetia [Ezetimibe] Other (See Comments)    Edema   . Crestor [Rosuvastatin] Other (See Comments)    Unknown  . Zocor [Simvastatin] Other (See Comments)    Muscle weakness    Patient Measurements: Height: 5\' 2"  (157.5 cm) Weight: 161 lb 13.1 oz (73.4 kg) IBW/kg (Calculated) : 50.1 Heparin Dosing Weight: 66 kg  Vital Signs: Temp: 98.8 F (37.1 C) (07/30 0740) Temp src: Oral (07/30 0740) BP: 142/62 mmHg (07/30 0740) Pulse Rate: 71 (07/30 0500)  Labs:  Recent Labs  01/17/14 0255 01/18/14 0536 01/18/14 1230 01/18/14 2312 01/19/14 0339  HGB 10.6* 10.3*  --   --  9.7*  HCT 31.5* 31.2*  --   --  29.1*  PLT 241 245  --   --  239  LABPROT 19.5* 18.7*  --   --  18.2*  INR 1.65* 1.56*  --   --  1.51*  HEPARINUNFRC 0.56  --  0.25* 0.57 0.47  CREATININE 1.05 1.05  --   --   --     Estimated Creatinine Clearance: 40.7 ml/min (by C-G formula based on Cr of 1.05).  . sodium chloride Stopped (01/17/14 1900)  . sodium chloride Stopped (01/18/14 1442)  . sodium chloride 75 mL/hr (01/19/14 0345)  . heparin 1,050 Units/hr (01/19/14 0737)    Assessment: 80 y/o F with NSTEMI, h/o Afib and Coumadin on hold, INR down slightly to 1.5 this am, last dose of Coumadin 7/25.    AM heparin level subtherapeutic on 1050 units/hr. Heparin restarted 7/28 post-cath. Plan for staged PCI today. Coumadin to restart post cath.  Goal of Therapy:  Heparin level 0.3-0.7 units/ml Monitor platelets by anticoagulation protocol: Yes   Plan:  1. Continue heparin at 1050 units/hr 2. F/u plans to resume Coumadin post cath.  Erin Hearing PharmD., BCPS Clinical Pharmacist Pager [phone] 01/19/2014 8:05 AM

## 2014-01-20 ENCOUNTER — Telehealth: Payer: Self-pay | Admitting: Cardiology

## 2014-01-20 ENCOUNTER — Other Ambulatory Visit: Payer: Self-pay | Admitting: Physician Assistant

## 2014-01-20 DIAGNOSIS — D649 Anemia, unspecified: Secondary | ICD-10-CM

## 2014-01-20 DIAGNOSIS — I480 Paroxysmal atrial fibrillation: Secondary | ICD-10-CM

## 2014-01-20 DIAGNOSIS — I209 Angina pectoris, unspecified: Secondary | ICD-10-CM

## 2014-01-20 LAB — GLUCOSE, CAPILLARY
GLUCOSE-CAPILLARY: 147 mg/dL — AB (ref 70–99)
Glucose-Capillary: 156 mg/dL — ABNORMAL HIGH (ref 70–99)

## 2014-01-20 LAB — CBC
HCT: 30.4 % — ABNORMAL LOW (ref 36.0–46.0)
HEMATOCRIT: 26.2 % — AB (ref 36.0–46.0)
HEMOGLOBIN: 10 g/dL — AB (ref 12.0–15.0)
Hemoglobin: 8.8 g/dL — ABNORMAL LOW (ref 12.0–15.0)
MCH: 29.4 pg (ref 26.0–34.0)
MCH: 29.5 pg (ref 26.0–34.0)
MCHC: 33.6 g/dL (ref 30.0–36.0)
MCHC: 32.9 g/dL (ref 30.0–36.0)
MCV: 87.6 fL (ref 78.0–100.0)
MCV: 89.7 fL (ref 78.0–100.0)
Platelets: 218 10*3/uL (ref 150–400)
Platelets: 231 10*3/uL (ref 150–400)
RBC: 2.99 MIL/uL — ABNORMAL LOW (ref 3.87–5.11)
RBC: 3.39 MIL/uL — AB (ref 3.87–5.11)
RDW: 14.3 % (ref 11.5–15.5)
RDW: 14.2 % (ref 11.5–15.5)
WBC: 5.9 10*3/uL (ref 4.0–10.5)
WBC: 5.6 10*3/uL (ref 4.0–10.5)

## 2014-01-20 LAB — BASIC METABOLIC PANEL
Anion gap: 13 (ref 5–15)
BUN: 13 mg/dL (ref 6–23)
CALCIUM: 8.5 mg/dL (ref 8.4–10.5)
CHLORIDE: 101 meq/L (ref 96–112)
CO2: 23 mEq/L (ref 19–32)
CREATININE: 0.96 mg/dL (ref 0.50–1.10)
GFR calc non Af Amer: 55 mL/min — ABNORMAL LOW (ref >90)
GFR, EST AFRICAN AMERICAN: 64 mL/min — AB (ref >90)
Glucose, Bld: 148 mg/dL — ABNORMAL HIGH (ref 70–99)
Potassium: 4.2 mEq/L (ref 3.7–5.3)
Sodium: 137 mEq/L (ref 137–147)

## 2014-01-20 LAB — PROTIME-INR
INR: 1.36 (ref 0.00–1.49)
Prothrombin Time: 16.8 seconds — ABNORMAL HIGH (ref 11.6–15.2)

## 2014-01-20 LAB — PLATELET INHIBITION P2Y12: PLATELET FUNCTION P2Y12: 254 [PRU] (ref 194–418)

## 2014-01-20 MED ORDER — FUROSEMIDE 10 MG/ML IJ SOLN
40.0000 mg | Freq: Once | INTRAMUSCULAR | Status: AC
  Administered 2014-01-20: 40 mg via INTRAVENOUS

## 2014-01-20 MED ORDER — FUROSEMIDE 10 MG/ML IJ SOLN
INTRAMUSCULAR | Status: AC
  Filled 2014-01-20: qty 4

## 2014-01-20 MED ORDER — CLOPIDOGREL BISULFATE 75 MG PO TABS: 75.0000 mg | ORAL_TABLET | Freq: Every day | ORAL | Status: DC

## 2014-01-20 MED ORDER — WARFARIN SODIUM 2.5 MG PO TABS
2.5000 mg | ORAL_TABLET | Freq: Once | ORAL | Status: DC
  Filled 2014-01-20: qty 1

## 2014-01-20 NOTE — Discharge Instructions (Signed)
Metformin and X-ray Contrast Studies For some X-ray exams, a contrast dye is used. Contrast dye is a type of medicine used to make the X-ray image clearer. The contrast dye is given to the patient through a vein (intravenously). If you need to have this type of X-ray exam and you take a medication called metformin, your caregiver may have you stop taking metformin before the exam.  LACTIC ACIDOSIS In rare cases, a serious medical condition called lactic acidosis can develop in people who take metformin and receive contrast dye. The following conditions can increase the risk of this complication:   Kidney failure.  Liver problems.  Certain types of heart problems such as:  Heart failure.  Heart attack.  Heart infection.  Heart valve problems.  Alcohol abuse. If left untreated, lactic acidosis can lead to coma.  SYMPTOMS OF LACTIC ACIDOSIS Symptoms of lactic acidosis can include:  Rapid breathing (hyperventilation).  Neurologic symptoms such as:  Headaches.  Confusion.  Dizziness.  Excessive sweating.  Feeling sick to your stomach (nauseous) or throwing up (vomiting). AFTER THE X-RAY EXAM  Stay well-hydrated. Drink fluids as instructed by your caregiver.  If you have a risk of developing lactic acidosis, blood tests may be done to make sure your kidney function is okay.  Metformin is usually stopped for 48 hours after the X-ray exam. Ask your caregiver when you can start taking metformin again. SEEK MEDICAL CARE IF:   You have shortness of breath or difficulty breathing.  You develop a headache that does not go away.  You have nausea or vomiting.  You urinate more than normal.  You develop a skin rash and have:  Redness.  Swelling.  Itching. Document Released: 05/28/2009 Document Revised: 09/01/2011 Document Reviewed: 05/28/2009 [facility] Patient Information 2015 [place], [place]. This information is not intended to replace advice given to you by your health  care provider. Make sure you discuss any questions you have with your health care provider.  Acute Coronary Syndrome Acute coronary syndrome (ACS) is an urgent problem in which the blood and oxygen supply to the heart is critically deficient. ACS requires hospitalization because one or more coronary arteries may be blocked. ACS represents a range of conditions including:  Previous angina that is now unstable, lasts longer, happens at rest, or is more intense.  A heart attack, with heart muscle cell injury and death. There are three vital coronary arteries that supply the heart muscle with blood and oxygen so that it can pump blood effectively. If blockages to these arteries develop, blood flow to the heart muscle is reduced. If the heart does not get enough blood, angina may occur as the first warning sign. SYMPTOMS   The most common signs of angina include:  Tightness or squeezing in the chest.  Feeling of heaviness on the chest.  Discomfort in the arms, neck, back, or jaw.  Shortness of breath and nausea.  Cold, wet skin.  Angina is usually brought on by physical effort or excitement which increase the oxygen needs of the heart. These states increase the blood flow needs of the heart beyond what can be delivered.  Other symptoms that are not as common include:  Fatigue  Unexplained feelings of nervousness or anxiety  Weakness  Diarrhea  Sometimes, you may not have noticed any symptoms at all but still suffered a cardiac injury. TREATMENT   Medicines to help discomfort may include nitroglycerin (nitro) in the form of tablets or a spray for rapid relief, or longer-acting forms  such as cream, patches, or capsules. (Be aware that there are many side effects and possible interactions with other drugs).  Other medicines may be used to help the heart pump better.  Procedures to open blocked arteries including angioplasty or stent placement to keep the arteries open.  Open  heart surgery may be needed when there are many blockages or they are in critical locations that are best treated with surgery. HOME CARE INSTRUCTIONS   Do not use any tobacco products including cigarettes, chewing tobacco, or electronic cigarettes.  Take one baby or adult aspirin daily, if your health care provider advises. This helps reduce the risk of a heart attack.  It is very important that you follow the angina treatment prescribed by your health care provider. Make arrangements for proper follow-up care.  Eat a heart healthy diet with salt and fat restrictions as advised.  Regular exercise is good for you as long as it does not cause discomfort. Do not begin any new type of exercise until you check with your health care provider.  If you are overweight, you should lose weight.  Try to maintain normal blood lipid levels.  Keep your blood pressure under control as recommended by your health care provider.  You should tell your health care provider right away about any increase in the severity or frequency of your chest discomfort or angina attacks. When you have angina, you should stop what you are doing and sit down. This may bring relief in 3 to 5 minutes. If your health care provider has prescribed nitro, take it as directed.  If your health care provider has given you a follow-up appointment, it is very important to keep that appointment. Not keeping the appointment could result in a chronic or permanent injury, pain, and disability. If there is any problem keeping the appointment, you must call back to this facility for assistance. SEEK IMMEDIATE MEDICAL CARE IF:   You develop nausea, vomiting, or shortness of breath.  You feel faint, lightheaded, or pass out.  Your chest discomfort gets worse.  You are sweating or experience sudden profound fatigue.  You do not get relief of your chest pain after 3 doses of nitro.  Your discomfort lasts longer than 15 minutes. MAKE SURE  YOU:   Understand these instructions.  Will watch your condition.  Will get help right away if you are not doing well or get worse.  Take all medicines as directed by your health care provider. Document Released: 06/09/2005 Document Revised: 06/14/2013 Document Reviewed: 10/11/2013 [facility] Patient Information 2015 [place], [place]. This information is not intended to replace advice given to you by your health care provider. Make sure you discuss any questions you have with your health care provider.  Coronary Artery Disease Coronary artery disease (CAD) is a process in which the heart (coronary) arteries narrow or become blocked from the development of atherosclerosis. Atherosclerosis is a disease in which plaque builds up on the inside of the heart arteries (coronary arteries). Plaque is made up of fats (lipids), cholesterol, calcium, and fibrous tissue. CAD can lead to a heart attack (myocardial infarction, MI). An MI can lead to heart failure, cardiogenic shock, or sudden cardiac death. CAD can cause an MI through:  Plaque buildup that can severely narrow or block the coronary arteries and diminish blood flow.  Plaque that can become unstable and "rupture." Unstable plaque that ruptures within a coronary artery can form a clot and cause a sudden (acute) blockage. RISK FACTORS Many risk  factors contribute to the development of CAD. These include:  High cholesterol (dyslipidemia) levels.  High blood pressure (hypertension).  Smoking.  Diabetes.  Age.  Gender. Men can develop CAD earlier in life than women.  Family history.  Inactivity or lack of regular physical or aerobic exercise.  A diet high in saturated fats.  Chronic kidney disease. SYMPTOMS  When a coronary artery is narrowed or blocked, an MI can occur. MI symptoms can include:  Chest pain (agina). Angina can occur by itself or it can also occur with pain in the neck, arm, jaw, or in the upper, middle back  (mid-scapular pain).  Profuse sweating (diaphoresis) without physical activity or movement.  Shortness of breath (dyspnea).  Irregular heartbeats (palpitations) that feel like your heart is skipping beats or is beating very fast.  Nausea.  Epigastric pain. Epigastric pain may occur as "heartburn."  Tiredness (malaise). This can especially be present in the elderly. Women can have different (atypical) symptoms other than classic angina.  DIAGNOSIS  The diagnosis of CAD may include:  An electrocardiography (ECG). An ECG does not diagnose CAD, but it is usefull in the detection of a sudden (acute) MI or as a marker for a previous MI. Depending on which heart (coronary) artery may be blocked, an ECG may not pick up an MI pattern.  Exercise stress test. A stress test can be performed at rest for people who are unable to do an exercise stress test. A stress test will only be abnormal if one or more of the large coronary arteries is significantly blocked.  Blood tests. Tests may include samples to detect heart muscle damage (such as troponin levels). Other tests may include cholesterol checks and an inflammation test (high-sensitivity C-reactive protein, hs-CRP).  Coronary angiography.  Screening people who have peripheral vascular disease (PAD). These people often times have CAD. TREATMENT  The treatment of CAD includes the following:  Lifestyle changes such as:  Following a heart-healthy diet. A registered dietitian can you help educate you on healthy food options and changes.  Quiting smoking.  Following an exercise program approved by your caregiver.  Maintaining a healthy weight. Lose weight as approved by your caregiver.  Medicines to help control your blood pressure, cholesterol level, angina, and blood clotting. Medicines may include beta-blockers, ACE inhibitors, statins, nitrates, and anti-platelet medicines.  If you have a heart stent and are taking anti-platelet  medicine, it is important to not suddenly stop taking this medicine. Suddenly stopping anti-platelet medicine can result in an MI. Talk with your caregiver before stopping medicine or if you cannot afford your medicine.  If the coronary arteries are significantly blocked, surgery may be needed. This can include:  Percutaneous coronary intervention (PCI) with or without stent placement.  Coronary artery bypass graft surgery (CABG). SEEK IMMEDIATE MEDICAL CARE IF:   You develop MI symptoms. This is a medical emergency. Get help at once. Call your local emergency service (911 in the [place] immediately. Do not drive yourself to the clinic or hospital. MI symptoms can include:  Angina or pain that occurs in the neck, arm, jaw, or in the upper middle back.  Profuse sweating without cause.  Shortness of breath or difficulty breathing without cause.  Unexplained nausea or epigastric pain that feels like heartburn. Document Released: 09/01/2011 Document Reviewed: 10/11/2013 [facility] Patient Information 2015 [place]. This information is not intended to replace advice given to you by your health care provider. Make sure you discuss any questions you have with your  health care provider.  No driving for 48 hours. No lifting over 5 lbs for 1 week. No sexual activity for 1 week. Keep procedure site clean & dry. If you notice increased pain, swelling, bleeding or pus, call/return!  You may shower, but no soaking baths/hot tubs/pools for 1 week.

## 2014-01-20 NOTE — Discharge Summary (Signed)
Discharge Summary   Patient ID: Ana [name],  MRN: [id number], DOB/AGE: [birth year] 80 y.o.  Admit date: 01/14/2014 Discharge date: 01/20/2014  Primary Care Provider: Pcp Not In System Primary Cardiologist: Dr. Alfonzo Feller  Discharge Diagnoses Principal Problem:   NSTEMI (non-ST elevated myocardial infarction)  - multivessel disease s/p 3 BMS to LAD, LCx and mid RCA in 2 staged cath (7/28, 7/30) Active Problems:   PAF (paroxysmal atrial fibrillation)   Chronic diastolic CHF (congestive heart failure)   Essential hypertension   Mitral stenosis   Pulmonary HTN   Long term (current) use of anticoagulants   Atherosclerotic heart disease of native coronary artery with angina pectoris   Presence of stent in coronary artery: BMS in Cx, LAD & RCA   Allergies Allergies  Allergen Reactions  . Welchol [Colesevelam Hcl] Nausea Only  . Zetia [Ezetimibe] Other (See Comments)    Edema   . Crestor [Rosuvastatin] Other (See Comments)    Unknown  . Zocor [Simvastatin] Other (See Comments)    Muscle weakness    Procedures  Cath #1: CARDIAC CATHETERIZATION/PERCUTANEOUS CORONARY INTERVENTION   HEMODYNAMICS:  Central Aorta: 144/53  Left Ventricle: 144/17   ANGIOGRAPHY:  The left main coronary artery was angiographically normal and bifurcated into the LAD and left circumflex coronary artery.  The LAD was a large vessel. There were proximal 95% stenosis proximal to the region of the first small diagonal vessel. There was a 70% stenosis in the proximal portion of the second diagonal vessel. Otherwise, the LAD was free of significant disease and extended to the LV apex.  The left circumflex coronary artery was a large vessel and had 85% eccentric proximal stenosis. There was 40% smooth stenosis after the OM vessel. There is an additional distal marginal vessel and small posterolateral-like vessel.  The RCA was a large vessel that had 95% stenosis in the midsegment before a prominent  marginal branch. This appeared to be somewhat ulcerated. The marginal vessel was large caliber and extended distally and collateralize the PDA vessel retrograde. The RCA was occluded at the acute margin prior to the PDA takeoff. In addition to the collaterals from the right coronary artery, the distal RCA was also collateralized from the left coronary system.  Left ventriculography revealed an ejection fraction of approximately 45%. There was mid to basal inferior severe hypocontractility.   IMPRESSION:  Severe multivessel CAD with 95% proximal LAD stenosis, 70% stenosis in the proximal portion of the second diagonal branch, 85% stenosis in a large left circumflex coronary artery with 40% distal stenosis, and 95% stenosis in the mid RCA with total occlusion of the distal RCA in the region of the acute margin proximal to the PDA with collateralization to the PDA from the marginal branch, as well as collaterals to the distal RCA via left coronary system.  Mild/moderate LV dysfunction with severe hypokinesis of the mid to basal inferior wall.  Successful 2 vessel percutaneous coronary intervention with PTCA/bare-metal stenting of the LAD with insertion of a 3.5x18 mm MultiLink vision stent post dilated to 3.76 mm and a 95% stenosis being reduced to 0, and PTCA/bare-metal stenting of the 85% circumflex stenosis with insertion of a 3.5x15 mm MultiLink vision stent post dilated to 3.7, 8 mm with the stenosis being reduced to 0%.  RECOMMENDATION:  It appears that the total occlusion of the distal RCA may be chronic due to the presence of right and left to right.collaterals. The mid RCA lesion does appear to be ulcerated. The patient will  be restarted on heparin and hydrated. Tentative plans will be to perform PCI of her mid RCA in approximately 2 days. She is in sinus rhythm and will need aspirin and Plavix initially With plans for long term Coumadin therapy in light of her PAF.   Cath #2: CARDIAC  CATHETERIZATION AND PERCUTANEOUS CORONARY INTERVENTION REPORT  FINDINGS:  Hemodynamics:  Central Aortic Pressure / Mean: 112/50/74 mmHg  Left Ventricular Pressure / LVEDP: 113/12/20 mmHg  PATIENT DISPOSITION:  The patient was transferred to the PACU holding area in a hemodynamicaly stable, chest pain free condition.  The patient tolerated the procedure well, and there were no complications. EBL: < 5 ml  The patient was stable before, during, and after the procedure. POST-OPERATIVE DIAGNOSIS:  Successful PCI of the mid RCA reducing a 80-90% stenosis to 0% with TIMI-3 flow restored to the distal vessel occlusion. Brisk TIMI-3 flow major RV marginal branch providing collaterals to the distal RCA system.  Relatively normal systolic pressures but moderately elevated LVEDP PLAN OF CARE:  Return to TCU for postcatheterization care. Standard post radial Care with TR band removal.  Will defer decision as to when to initiate Coumadin therapy to the primary service. Minor scanning was that we would initially use aspirin plus Plavix and not start Coumadin until later. With 3 new bare-metal stents, would need at least one month of dual antiplatelet therapy after which my preference would be to stop aspirin and start Coumadin for A. Fib.  She does have an elevated LVEDP and made benefit from low-dose diuretic on discharge.  Anticipate discharge in the morning if stable.    Hospital Course  The patient is a 80 year old Caucasian female with past medical history of diabetes, hypertension, hypothyroidism, anemia of chronic disease, hyperlipidemia, and a history of paroxysmal atrial fibrillation on Coumadin. Patient presented to [facility] ED on 01/14/2014 after waking up from sleep with significant dyspnea and discomfort over her left scapula. He came to the emergency room where her chest x-ray showed cardiomegaly and early congestive heart failure. Her pro BNP was also elevated consistent with CHF. Initial  troponin was elevated at 0.48. However subsequent troponin increased to 4.8. IV heparin was added for NSTEMI. Her Coumadin was discontinued to allow INR to decrease before planned cardiac catheterization. Her INR trended down to 1.65 on 7/28 allowing her to undergo the procedure. Cardiac catheterization showed severe multivessel coronary artery disease is 95% proximal LAD stenosis treated with BMS, 70% proximal D2 stenosis, 85% left circumflex stenosis which also treated with BMS with 40% distal residual, 95% mid RCA stenosis with total occlusion of the distal RCA which appears to be chronic with left to right collaterals. RCA lesion was left as part of staged angioplasty due to appearance of ulcerated lesion during original cath. She returned to the cath lab on 01/19/2014 for second part of staged PCI therapy, a bare-metal stent was placed in mid RCA. Her Coumadin was resumed shortly after cardiac catheterization. It was planned for her to be on temporary therapy with aspirin, Plavix, and Coumadin for the first month, and potentially discontinue aspirin to resume on Plavix and Coumadin after the first month.  She was evaluated on 01/20/2014, at which time she appears to be doing well. Exam does show that she has bibasilar rales. We gave her one more dose of IV Lasix 40 mg. It was also noted that her hemoglobin trended down to 8.8 from 9.7 a day ago. CBC was repeated which came back showing hemoglobin was in  fact 10.0. She does however have elevated P2Y12 level of 254, after discussing with attending, she will be kept on aspirin, Plavix and Coumadin. She is deemed stable for discharge from cardiology perspective. She will be transitioned to plavix and coumadin after 1 month.  She has been advised to hold off on taking metformin for at least 48 hours after cardiac catheterization and contrast dye use, she can resume metformin on August 1. Office will call the patient for outpatient PT INR check on August  4.  Discharge Vitals Blood pressure 135/50, pulse 72, temperature 98.4 F (36.9 C), temperature source Oral, resp. rate 23, height 5\' 2"  (1.575 m), weight 166 lb 0.1 oz (75.3 kg), SpO2 98.00%.  Filed Weights   01/18/14 0500 01/19/14 0413 01/20/14 0517  Weight: 164 lb 0.4 oz (74.4 kg) 161 lb 13.1 oz (73.4 kg) 166 lb 0.1 oz (75.3 kg)    Labs  CBC  Recent Labs  01/20/14 0253 01/20/14 1520  WBC 5.9 5.6  HGB 8.8* 10.0*  HCT 26.2* 30.4*  MCV 87.6 89.7  PLT 218 160   Basic Metabolic Panel  Recent Labs  01/18/14 0536 01/20/14 0253  NA 140 137  K 4.1 4.2  CL 103 101  CO2 23 23  GLUCOSE 144* 148*  BUN 17 13  CREATININE 1.05 0.96  CALCIUM 8.3* 8.5    Disposition  Pt is being discharged home today in good condition.  Follow-up Plans & Appointments      Follow-up Information   Follow up with [facility] On 02/06/2014. (See Kerin Ransom for followup. 10:45am)    Specialty:  Cardiology   Contact information:   [address], [place] [address] [phone]      Follow up with [facility] On 01/27/2014. (Obtain CBC in lab. Can arrive at any time as long as lab opens)    Specialty:  Cardiology   Contact information:   [address] [place] [address] [phone]      Follow up with [facility] On 01/24/2014. (check PT/INR. Office will call to schedule)    Contact information:   [address] [place] [address]       Discharge Medications    Medication List         aspirin 81 MG tablet  Take 81 mg by mouth daily.     clopidogrel 75 MG tablet  Commonly known as:  PLAVIX  Take 1 tablet (75 mg total) by mouth daily with breakfast.     Co Q 10 10 MG Caps  Take 10 mg by mouth daily.     losartan 100 MG tablet  Commonly known as:  COZAAR  Take 1 tablet (100 mg total) by mouth daily.     metFORMIN 1000 MG tablet  Commonly known as:  GLUCOPHAGE  Take  1,000 mg by mouth 2 (two) times daily with a meal.     metoprolol tartrate 25 MG tablet  Commonly known as:  LOPRESSOR  Take 1 tablet (25 mg total) by mouth 2 (two) times daily.     Omega-3 Krill Oil 500 MG Caps  Take 1 capsule by mouth daily.     Vitamin D3 2000 UNITS Tabs  Take 2,000 Units by mouth daily.     warfarin 2.5 MG tablet  Commonly known as:  COUMADIN  Take 1.25-2.5 mg by mouth See admin instructions. Take 1.25mg  by mouth on  Sunday. Take 2.5mg  by mouth on Monday, Tuesday, Wednesday, Thursday, Friday and Saturday.        Outstanding Labs/Studies  Check PT/INR on Aug 4th and CBC in 1 week  Duration of Discharge Encounter   Greater than 30 minutes including physician time.  Hilbert Corrigan PA-C 01/20/2014, 4:58 PM   Patient seen and examined with Almyra Deforest, PA-C. We discussed all aspects of the encounter. I agree with the assessment and plan as stated above. She is stable for d/c. P2Y12 is still elevated. Will d/c on asa, plavix and coumadin and watch closely for bleeding. After 30 days can consider dropping asa OR plavix and continuing coumadin. Given multiple BMS may be reasonable to continue plavix if not having any bleeding.   Danielle Mink,MD 9:25 PM

## 2014-01-20 NOTE — Progress Notes (Signed)
CARDIAC REHAB PHASE I   PRE:  Rate/Rhythm: 69 SR    BP: sitting 128/38    SaO2: 94 RA  MODE:  Ambulation: 350 ft   POST:  Rate/Rhythm: 87 SR    BP: sitting 135/50     SaO2: 95 RA  Pt steady with RW. No c/o. Veered left at times. Return to recliner. Ed discussed, pt seemingly disinterested. Unable to do CRPII due to lack of transport (doesn't drive). [id number]   Ana [name] CES, ACSM 01/20/2014 10:48 AM

## 2014-01-20 NOTE — Telephone Encounter (Signed)
New message     TCM appt on 02-01-14 per hal

## 2014-01-20 NOTE — Progress Notes (Signed)
[facility] for warfarin  Indication: afib  Allergies  Allergen Reactions  . Welchol [Colesevelam Hcl] Nausea Only  . Zetia [Ezetimibe] Other (See Comments)    Edema   . Crestor [Rosuvastatin] Other (See Comments)    Unknown  . Zocor [Simvastatin] Other (See Comments)    Muscle weakness    Patient Measurements: Height: 5\' 2"  (157.5 cm) Weight: 166 lb 0.1 oz (75.3 kg) IBW/kg (Calculated) : 50.1 Heparin Dosing Weight: 66 kg  Vital Signs: Temp: 98.3 F (36.8 C) (07/31 0700) Temp src: Oral (07/31 0700) BP: 102/73 mmHg (07/31 0800) Pulse Rate: 68 (07/31 0800)  Labs:  Recent Labs  01/18/14 0536 01/18/14 1230 01/18/14 2312 01/19/14 0339 01/20/14 0253  HGB 10.3*  --   --  9.7* 8.8*  HCT 31.2*  --   --  29.1* 26.2*  PLT 245  --   --  239 218  LABPROT 18.7*  --   --  18.2* 16.8*  INR 1.56*  --   --  1.51* 1.36  HEPARINUNFRC  --  0.25* 0.57 0.47  --   CREATININE 1.05  --   --   --  0.96    Estimated Creatinine Clearance: 45.2 ml/min (by C-G formula based on Cr of 0.96).   Assessment: 80 y/o F with NSTEMI, h/o Afib and Coumadin held for cath, resumed 7/30. Prior last dose of Coumadin 7/25.  S/p BMS to RCA 7/30.  plan ASA 81 + Plavix + coumadin INR 2-2.5 for 1 month then drop aspirin.  INR 1.36 reflects the 4 days coumadin was held for work up/procedure.  Hg 8.8, pltc 218, no bleeding reported.  Home coumadin: Coumadin 2.5mg  daily x 1.25 on Sunday  Goal of Therapy:  INR 2-2.5 per MD note   Plan:  1. Warfarin 2.5mg  tonight 2. Daily INR Eudelia Bunch, Pharm.D. [phone] 01/20/2014 10:53 AM

## 2014-01-20 NOTE — Progress Notes (Signed)
Patient Name: Ana [name] Date of Encounter: 01/20/2014     Principal Problem:   NSTEMI (non-ST elevated myocardial infarction) Active Problems:   PAF (paroxysmal atrial fibrillation)   Chronic diastolic CHF (congestive heart failure)   Essential hypertension   Mitral stenosis   Pulmonary HTN   Long term (current) use of anticoagulants   Atherosclerotic heart disease of native coronary artery with angina pectoris   Presence of stent in coronary artery: BMS in Cx, LAD & RCA    SUBJECTIVE  Denies any CP or SOB overnight. Ambulated today, feeling food  CURRENT MEDS . antiseptic oral rinse  15 mL Mouth Rinse BID  . aspirin  81 mg Oral Daily  . clopidogrel  75 mg Oral Q breakfast  . insulin aspart  0-15 Units Subcutaneous TID WC  . losartan  100 mg Oral Daily  . metoprolol  25 mg Oral BID  . warfarin  2.5 mg Oral ONCE-1800  . Warfarin - Pharmacist Dosing Inpatient   Does not apply q1800    OBJECTIVE  Filed Vitals:   01/20/14 0600 01/20/14 0700 01/20/14 0800 01/20/14 1146  BP: 143/54 134/44 102/73 135/50  Pulse: 66 69 68 72  Temp:  98.3 F (36.8 C)  98.4 F (36.9 C)  TempSrc:  Oral  Oral  Resp: 21 18 19 23   Height:      Weight:      SpO2: 92% 95% 96% 98%    Intake/Output Summary (Last 24 hours) at 01/20/14 1428 Last data filed at 01/19/14 2130  Gross per 24 hour  Intake 1042.5 ml  Output    200 ml  Net  842.5 ml   Filed Weights   01/18/14 0500 01/19/14 0413 01/20/14 0517  Weight: 164 lb 0.4 oz (74.4 kg) 161 lb 13.1 oz (73.4 kg) 166 lb 0.1 oz (75.3 kg)    PHYSICAL EXAM  General: Pleasant, NAD. Neuro: Alert and oriented X 3. Moves all extremities spontaneously. Psych: Normal affect. HEENT:  Normal  Neck: Supple without bruits or JVD. Lungs:  Resp regular and unlabored, bibasilar rale Heart: RRR no s3, s4, or murmurs. Abdomen: Soft, non-tender, non-distended, BS + x 4.  Extremities: No clubbing, cyanosis. DP/PT/Radials 2+ and equal bilaterally. 1+  bilateral LE edema  Accessory Clinical Findings  CBC  Recent Labs  01/19/14 0339 01/20/14 0253  WBC 7.1 5.9  HGB 9.7* 8.8*  HCT 29.1* 26.2*  MCV 88.7 87.6  PLT 239 132   Basic Metabolic Panel  Recent Labs  01/18/14 0536 01/20/14 0253  NA 140 137  K 4.1 4.2  CL 103 101  CO2 23 23  GLUCOSE 144* 148*  BUN 17 13  CREATININE 1.05 0.96  CALCIUM 8.3* 8.5   TELE  NSR with HR 60-70s overnight, HR increased to 80s-90s during the day. Occasional sinus pause, longest 1.7 sec  ECG  NSR with HR 70s, TWI in I, aVL and V2, no significant ST changes  Radiology/Studies  Dg Chest 2 View  01/14/2014   CLINICAL DATA:  Shoulder pain, dizziness, shortness of breath  EXAM: CHEST  2 VIEW  COMPARISON:  None.  FINDINGS: Marked cardiomegaly. Diffuse bilateral interstitial opacities with "Peripheral Kerley B-lines ". This could represent chronic interstitial lung disease versus early edema. Minimal basilar atelectasis versus scarring. No significant effusion or pneumothorax. Trachea midline. Degenerative changes of the spine. Bones are osteopenic.  IMPRESSION: Cardiomegaly with interstitial changes, possibly chronic interstitial lung disease versus early edema pattern.  Basilar atelectasis.   Electronically  Signed   By: Daryll Brod M.D.   On: 01/14/2014 08:54   Ct Angio Chest Aortic Dissect W &/or W/o  01/14/2014   CLINICAL DATA:  back pain back pain; SHORTNESS OF BREATH SHORTNESS OF BREATH  EXAM: CT ANGIOGRAPHY CHEST, ABDOMEN AND PELVIS  TECHNIQUE: Multidetector CT imaging through the chest, abdomen and pelvis was performed using the standard protocol during bolus administration of intravenous contrast. Multiplanar reconstructed images and MIPs were obtained and reviewed to evaluate the vascular anatomy.  CONTRAST:  129mL OMNIPAQUE IOHEXOL 350 MG/ML SOLN  COMPARISON:  None.  FINDINGS: CTA CHEST FINDINGS  The noncontrast scout shows no hyperdense intramural hematoma or mediastinal hematoma. Trace  pericardial effusion. Small bilateral pleural effusions right greater than left. Four-chamber cardiac enlargement.  On CTA, Adequate contrast opacification of the thoracic aorta with no evidence of dissection, aneurysm, or stenosis. There is classic 3-vessel brachiocephalic arch anatomy without proximal stenosis. Small ductus diverticulum. Patchy aortic plaque. Satisfactory opacification of pulmonary arteries noted, and there is no evidence of pulmonary emboli. Mildly enlarged subcarinal lymph nodes. Subcentimeter prevascular, left suprahilar, right paratracheal, and precarinal lymph nodes. No hilar adenopathy.  Review of the MIP images confirms the above findings.  CTA ABDOMEN AND PELVIS FINDINGS  Arterial findings:  Aorta: Mild calcified atheromatous plaque. No aneurysm, dissection, or stenosis.  Celiac axis: Partially calcified origin plaque resulting in greater than 50% diameter short segment stenosis, patent distally.  Superior mesenteric: Nonocclusive ostial plaque, patent distally, with replaced right hepatic arterial supply, anatomic variant.  Left renal:          Single, patent.  Right renal:         Single, patent.  Inferior mesenteric: Patent.  Left iliac: Mild scattered plaque. No aneurysm or stenosis.  Right iliac: Mild scattered plaque. No aneurysm or stenosis.  Venous findings:     Dedicated venous phase imaging not obtained.  Review of the MIP images confirms the above findings.  Nonvascular findings: Unremarkable liver, gallbladder, spleen, bilateral adrenal glands, kidneys. Diffuse pancreatic parenchymal atrophy. Small hiatal hernia. Stomach, small bowel, colon are decompressed. Uterus and adnexal regions unremarkable. Urinary bladder incompletely distended. Bilateral pelvic phleboliths. No ascites. No free air. No adenopathy localized. Mild spondylitic changes in the lumbar spine.  IMPRESSION: 1. Negative for aortic dissection, aneurysm, or other acute vascular abnormality. 2. Small pleural  effusions, cardiomegaly, and patchy ground-glass opacities throughout both lungs suggesting CHF. 3. Subcarinal adenopathy, possibly reactive but nonspecific. 4. Origin stenosis of the celiac axis. Widely patent SMA and IMA likely render this clinically inapparent.   Electronically Signed   By: Arne Cleveland M.D.   On: 01/14/2014 10:32   Ct Cta Abd/pel W/cm &/or W/o Cm  01/14/2014   CLINICAL DATA:  back pain back pain; SHORTNESS OF BREATH SHORTNESS OF BREATH  EXAM: CT ANGIOGRAPHY CHEST, ABDOMEN AND PELVIS  TECHNIQUE: Multidetector CT imaging through the chest, abdomen and pelvis was performed using the standard protocol during bolus administration of intravenous contrast. Multiplanar reconstructed images and MIPs were obtained and reviewed to evaluate the vascular anatomy.  CONTRAST:  112mL OMNIPAQUE IOHEXOL 350 MG/ML SOLN  COMPARISON:  None.  FINDINGS: CTA CHEST FINDINGS  The noncontrast scout shows no hyperdense intramural hematoma or mediastinal hematoma. Trace pericardial effusion. Small bilateral pleural effusions right greater than left. Four-chamber cardiac enlargement.  On CTA, Adequate contrast opacification of the thoracic aorta with no evidence of dissection, aneurysm, or stenosis. There is classic 3-vessel brachiocephalic arch anatomy without proximal stenosis. Small ductus diverticulum. Patchy  aortic plaque. Satisfactory opacification of pulmonary arteries noted, and there is no evidence of pulmonary emboli. Mildly enlarged subcarinal lymph nodes. Subcentimeter prevascular, left suprahilar, right paratracheal, and precarinal lymph nodes. No hilar adenopathy.  Review of the MIP images confirms the above findings.  CTA ABDOMEN AND PELVIS FINDINGS  Arterial findings:  Aorta: Mild calcified atheromatous plaque. No aneurysm, dissection, or stenosis.  Celiac axis: Partially calcified origin plaque resulting in greater than 50% diameter short segment stenosis, patent distally.  Superior mesenteric:  Nonocclusive ostial plaque, patent distally, with replaced right hepatic arterial supply, anatomic variant.  Left renal:          Single, patent.  Right renal:         Single, patent.  Inferior mesenteric: Patent.  Left iliac: Mild scattered plaque. No aneurysm or stenosis.  Right iliac: Mild scattered plaque. No aneurysm or stenosis.  Venous findings:     Dedicated venous phase imaging not obtained.  Review of the MIP images confirms the above findings.  Nonvascular findings: Unremarkable liver, gallbladder, spleen, bilateral adrenal glands, kidneys. Diffuse pancreatic parenchymal atrophy. Small hiatal hernia. Stomach, small bowel, colon are decompressed. Uterus and adnexal regions unremarkable. Urinary bladder incompletely distended. Bilateral pelvic phleboliths. No ascites. No free air. No adenopathy localized. Mild spondylitic changes in the lumbar spine.  IMPRESSION: 1. Negative for aortic dissection, aneurysm, or other acute vascular abnormality. 2. Small pleural effusions, cardiomegaly, and patchy ground-glass opacities throughout both lungs suggesting CHF. 3. Subcarinal adenopathy, possibly reactive but nonspecific. 4. Origin stenosis of the celiac axis. Widely patent SMA and IMA likely render this clinically inapparent.   Electronically Signed   By: Arne Cleveland M.D.   On: 01/14/2014 10:32    ASSESSMENT AND PLAN 80yo female with h/o DM, HTN and chronic diastolic dysfunction presented on 01/14/2014 after waking up with significant dyspnea and discomfort across her intrascapular area, initial CXR was consistent with CHF along with elevated proBNP. Troponin was elevated 0.48. Second troponin elevated 4.89.  1. NSTEMI: 3v CAD on cath. S/p BMS to LAD and LCX on 7/28 and BMS to [facility] 7/30. EF 50% by echo.   - Patient had 3 BMS. Planned ASA 81 + Plavix + coumadin INR 2-2.5 for 1 month then drop aspirin.   - Dr. Haroldine Laws will discuss with Dr. Aundra Dubin whether to continue on triple therapy with her  anemia  2. Acute diastolic CHF: Stable, mild rale in bilateral bases and 1+ edema in LE, no significant JVD  - give single dose 40mg  IV lasix  - close follow up to monitor for recurrence of fluid accumulation  3. Paroxysmal atrial fibrillation: Back in NSR. Resume coumadin tonight goal INR 2-2.5.  4. HTN: BP stable.  5. UTI: E coli, on ceftriaxone x 3 days.  6. Anemia: hgb trending down. Recheck CBC, discussed with Dr. Haroldine Laws, if CBC =/> 8.8, plan for discharge later today  Discharge later today, if CBC looks ok and finish 40mg  IV lasix  Signed, Woodward Ku Pager: [phone]  Patient seen and examined with Almyra Deforest, PA-C. We discussed all aspects of the encounter. I agree with the assessment and plan as stated above.   She looks good. Agree with d/c today if hgb stable. Will check P2Y12 level if therapeutic will d/c asa and treat with plavix/couamdin if not therapeutic will also continue asa 81.   Gurkaran Rahm,MD 2:59 PM

## 2014-01-23 NOTE — Telephone Encounter (Signed)
Left message for patient to call office and ask for triage for post-hospital f/u phone call

## 2014-01-24 NOTE — Telephone Encounter (Signed)
LMTCB- asked pt to call office and ask for triage for post-hospital f/u phone call.

## 2014-01-24 NOTE — Telephone Encounter (Signed)
Was told to send back to Triage

## 2014-01-26 ENCOUNTER — Other Ambulatory Visit (INDEPENDENT_AMBULATORY_CARE_PROVIDER_SITE_OTHER): Payer: Medicare Other

## 2014-01-26 DIAGNOSIS — D649 Anemia, unspecified: Secondary | ICD-10-CM | POA: Diagnosis not present

## 2014-01-26 LAB — CBC
HCT: 29.9 % — ABNORMAL LOW (ref 36.0–46.0)
Hemoglobin: 10.1 g/dL — ABNORMAL LOW (ref 12.0–15.0)
MCHC: 33.7 g/dL (ref 30.0–36.0)
MCV: 88.2 fl (ref 78.0–100.0)
PLATELETS: 346 10*3/uL (ref 150.0–400.0)
RBC: 3.39 Mil/uL — ABNORMAL LOW (ref 3.87–5.11)
RDW: 14.9 % (ref 11.5–15.5)
WBC: 4.4 10*3/uL (ref 4.0–10.5)

## 2014-01-27 ENCOUNTER — Other Ambulatory Visit: Payer: 59

## 2014-01-27 ENCOUNTER — Encounter: Payer: Self-pay | Admitting: Cardiovascular Disease

## 2014-01-30 NOTE — Telephone Encounter (Signed)
noted 

## 2014-02-06 ENCOUNTER — Encounter: Payer: 59 | Admitting: Cardiology

## 2014-02-14 ENCOUNTER — Other Ambulatory Visit: Payer: Self-pay | Admitting: *Deleted

## 2014-02-14 NOTE — Telephone Encounter (Signed)
Patient requests coumadin refill. I have made her aware that she needs an appointment, and I transferred her to scheduling to set that up.

## 2014-02-16 ENCOUNTER — Ambulatory Visit (INDEPENDENT_AMBULATORY_CARE_PROVIDER_SITE_OTHER): Payer: Medicare Other | Admitting: Pharmacist

## 2014-02-16 DIAGNOSIS — Z7901 Long term (current) use of anticoagulants: Secondary | ICD-10-CM

## 2014-02-16 DIAGNOSIS — I4891 Unspecified atrial fibrillation: Secondary | ICD-10-CM

## 2014-02-16 DIAGNOSIS — I480 Paroxysmal atrial fibrillation: Secondary | ICD-10-CM

## 2014-02-16 LAB — POCT INR
INR: 2
INR: 2

## 2014-02-16 MED ORDER — METFORMIN HCL 1000 MG PO TABS: 1000.0000 mg | ORAL_TABLET | Freq: Two times a day (BID) | ORAL | Status: DC

## 2014-02-17 MED ORDER — WARFARIN SODIUM 2.5 MG PO TABS: 2.5000 mg | ORAL_TABLET | ORAL | Status: DC

## 2014-02-21 ENCOUNTER — Encounter: Payer: Self-pay | Admitting: Physician Assistant

## 2014-02-21 ENCOUNTER — Ambulatory Visit (INDEPENDENT_AMBULATORY_CARE_PROVIDER_SITE_OTHER): Payer: Medicare Other | Admitting: Physician Assistant

## 2014-02-21 VITALS — BP 141/81 | HR 57 | Ht 62.0 in | Wt 162.0 lb

## 2014-02-21 DIAGNOSIS — I5032 Chronic diastolic (congestive) heart failure: Secondary | ICD-10-CM | POA: Diagnosis not present

## 2014-02-21 DIAGNOSIS — I10 Essential (primary) hypertension: Secondary | ICD-10-CM | POA: Diagnosis not present

## 2014-02-21 DIAGNOSIS — I2510 Atherosclerotic heart disease of native coronary artery without angina pectoris: Secondary | ICD-10-CM | POA: Insufficient documentation

## 2014-02-21 DIAGNOSIS — I4891 Unspecified atrial fibrillation: Secondary | ICD-10-CM | POA: Diagnosis not present

## 2014-02-21 DIAGNOSIS — I272 Pulmonary hypertension, unspecified: Secondary | ICD-10-CM

## 2014-02-21 DIAGNOSIS — I050 Rheumatic mitral stenosis: Secondary | ICD-10-CM

## 2014-02-21 DIAGNOSIS — I209 Angina pectoris, unspecified: Secondary | ICD-10-CM | POA: Diagnosis not present

## 2014-02-21 DIAGNOSIS — I2789 Other specified pulmonary heart diseases: Secondary | ICD-10-CM

## 2014-02-21 DIAGNOSIS — I480 Paroxysmal atrial fibrillation: Secondary | ICD-10-CM

## 2014-02-21 DIAGNOSIS — I509 Heart failure, unspecified: Secondary | ICD-10-CM

## 2014-02-21 DIAGNOSIS — E785 Hyperlipidemia, unspecified: Secondary | ICD-10-CM | POA: Insufficient documentation

## 2014-02-21 NOTE — Patient Instructions (Signed)
STOP PLAVIX AS OF TODAY PER SCOTT WEAVER, [facility]  Your physician recommends that you schedule a follow-up appointment in: [place]. Radford Pax

## 2014-02-21 NOTE — Progress Notes (Signed)
Cardiology Office Note    Date:  02/21/2014   ID:  SHAMRA BRADEEN, DOB 1931-10-31, MRN [id number]  PCP:  Pcp Not In System  Cardiologist:  Dr. Fransico Him     History of Present Illness: Ana [name] is a 80 y.o. female with a hx of PAFib, diastolic CHF, pulmonary HTN, mild mitral stenosis (echo 09/2012), HTN, HL, anemia of chronic disease.  She has been kept on coumadin instead of NOAC due to valvular heart dsz.  She was previously followed by the [facility] and est with Dr. Fransico Him 10/2013.  Admitted 12/2013 with NSTEMI c/b acute diastolic CHF.  She was gently diuresed.  Troponin peaked at 4.89.  She had recurrent episodes of PAFib but converted to NSR.  LHC demonstrated multivessel CAD.  She underwent PCI with BMS to the LAD and CFX and subsequent staged PCI with BMS to the RCA.  EF was preserved at 50%.  She was placed on Rocephin for E coli UTI.  P2Y12 was 254.  The plan is to keep her on ASA, Plavix, coumadin x 30 days.  Then, consideration can be made to DC ASA or Plavix and continue coumadin.      She returns for FU.  She notes decreased mobility. She walks with a walker. She denies chest pain, shortness of breath, syncope, orthopnea, PND or significant pedal edema.   Studies:  - LHC (7/15):  prox LAD 95%, prox D2 70%, CFX 85% (dist 40%), mid RCA 95%, dist RCA occl (L>R collats), EF 45%, inf HK >>> PCI:  3.5x18 mm MultiLink vision BMS to LAD, 3.5x15 mm MultiLink vision BMS to the CFX >>> staged PCI: MultiLink Vision BMS 2.75 mm x 18 mm to mid RCA  - Echo (7/15):  Mild focal basal septal hypertrophy, EF 50%, ant-septal HK, MAC, mild MR, mean MV gradient 2 mmHg, PAP in normal range   Recent Labs/Images: 01/14/2014: ALT 15; Pro B Natriuretic peptide (BNP) 3027.0*; TSH 2.490  01/15/2014: HDL Cholesterol by NMR 47; LDL (calc) 91  01/20/2014: Creatinine 0.96; Potassium 4.2  01/26/2014: Hemoglobin 10.1*    Wt Readings from Last 3 Encounters:  02/21/14 162 lb (73.483 kg)  01/20/14  166 lb 0.1 oz (75.3 kg)  01/20/14 166 lb 0.1 oz (75.3 kg)     Past Medical History  Diagnosis Date  . Diabetes mellitus without complication   . PAF (paroxysmal atrial fibrillation)   . Hypertension   . Mitral stenosis echo 09/2012    mild MS, mild LVF, grade II diastolic dysfunction, moderate to severe pulmonary HTN, normal LVF  . Chronic diastolic CHF (congestive heart failure)   . Pulmonary HTN     moderate to severe by echo 2014  . Hypothyroidism   . Anemia of chronic disease   . Hyperlipidemia   . Vitamin D deficiency disease     Current Outpatient Prescriptions  Medication Sig Dispense Refill  . aspirin 81 MG tablet Take 81 mg by mouth daily.      . Cholecalciferol (VITAMIN D3) 2000 UNITS TABS Take 2,000 Units by mouth daily.       . clopidogrel (PLAVIX) 75 MG tablet Take 1 tablet (75 mg total) by mouth daily with breakfast.  30 tablet  11  . Coenzyme Q10 (CO Q 10) 10 MG CAPS Take 10 mg by mouth daily.       Marland Kitchen losartan (COZAAR) 100 MG tablet Take 1 tablet (100 mg total) by mouth daily.  90 tablet  3  .  metFORMIN (GLUCOPHAGE) 1000 MG tablet Take 1 tablet (1,000 mg total) by mouth 2 (two) times daily with a meal.  60 tablet  0  . metoprolol (LOPRESSOR) 25 MG tablet Take 1 tablet (25 mg total) by mouth 2 (two) times daily.      . Omega-3 Krill Oil 500 MG CAPS Take 1 capsule by mouth daily.      Marland Kitchen warfarin (COUMADIN) 2.5 MG tablet Take 1 tablet (2.5 mg total) by mouth as directed.  35 tablet  2   No current facility-administered medications for this visit.     Allergies:   Welchol; Zetia; Crestor; and Zocor   Social History:  The patient  reports that she has never smoked. She does not have any smokeless tobacco history on file. She reports that she does not drink alcohol or use illicit drugs.   Family History:  The patient's family history includes Cancer in her mother; Cirrhosis in her father; Diabetes in her brother; Heart attack in her sister; Heart disease in her sister.    ROS:  Please see the history of present illness.   She denies any bleeding problems.   All other systems reviewed and negative.   PHYSICAL EXAM: VS:  BP 141/81  Pulse 57  Ht 5\' 2"  (1.575 m)  Wt 162 lb (73.483 kg)  BMI 29.62 kg/m2 Well nourished, well developed, in no acute distress HEENT: normal Neck:  no JVD Cardiac:  normal S1, S2;  RRR; no murmur Lungs:   clear to auscultation bilaterally, no wheezing, rhonchi or rales Abd: soft, nontender, no hepatomegaly Ext: trace bilateral LE edema Skin: warm and dry Neuro:  CNs 2-12 intact, no focal abnormalities noted  EKG:  Sinus bradycardia, HR 57, first degree AV block, nonspecific ST-T wave changes     ASSESSMENT AND PLAN:  1. CAD s/p NSTEMI 12/2013: She is doing well after recent non-STEMI treated with multivessel PCI. I reviewed her case today with Dr. Radford Pax. As her P2Y12 level was elevated and she had bare-metal stent platform used, we will stop her Plavix and continue her on aspirin plus Coumadin.  Continue beta blocker. She is intolerant of statins. 2. Chronic diastolic CHF (congestive heart failure): Volume stable. 3. PAF (paroxysmal atrial fibrillation): She is maintaining NSR. Continue current dose of beta blocker and Coumadin. 4. Essential hypertension: Controlled. 5. HLD (hyperlipidemia): She is intolerant of statins. 6. Mitral stenosis: This was not evident on recent echocardiogram. 7. Pulmonary HTN: This was not evident on recent echocardiogram.   Disposition:  FU with Dr. Radford Pax in 2 months    Signed, Versie Starks, MHS 02/21/2014 11:37 AM    [address], [place], [place]  [address] Phone: [phone]; Fax: [phone]

## 2014-03-16 ENCOUNTER — Ambulatory Visit (INDEPENDENT_AMBULATORY_CARE_PROVIDER_SITE_OTHER): Payer: 59 | Admitting: *Deleted

## 2014-03-16 ENCOUNTER — Telehealth: Payer: Self-pay | Admitting: Cardiology

## 2014-03-16 DIAGNOSIS — I4891 Unspecified atrial fibrillation: Secondary | ICD-10-CM

## 2014-03-16 DIAGNOSIS — I480 Paroxysmal atrial fibrillation: Secondary | ICD-10-CM

## 2014-03-16 DIAGNOSIS — Z7901 Long term (current) use of anticoagulants: Secondary | ICD-10-CM

## 2014-03-16 LAB — POCT INR: INR: 1.8

## 2014-03-16 MED ORDER — WARFARIN SODIUM 2.5 MG PO TABS: ORAL_TABLET | ORAL | Status: DC

## 2014-03-16 NOTE — Telephone Encounter (Signed)
lmtrc

## 2014-03-16 NOTE — Telephone Encounter (Signed)
Pt was seen in coumadin clinic today. She is almost out of metformin and she wants to know if Dr. Radford Pax can give her one more refill until she is able to find a PCP. She is working really hard to find a PCP. She just moved here from [place].

## 2014-03-16 NOTE — Telephone Encounter (Signed)
Pt.notified

## 2014-03-16 NOTE — Telephone Encounter (Signed)
She needs to go to [facility] to be seen for her DM management until she gets a PCP

## 2014-03-21 DIAGNOSIS — E785 Hyperlipidemia, unspecified: Secondary | ICD-10-CM | POA: Diagnosis not present

## 2014-03-21 DIAGNOSIS — E039 Hypothyroidism, unspecified: Secondary | ICD-10-CM | POA: Diagnosis not present

## 2014-03-21 DIAGNOSIS — I4891 Unspecified atrial fibrillation: Secondary | ICD-10-CM | POA: Diagnosis not present

## 2014-03-21 DIAGNOSIS — Z23 Encounter for immunization: Secondary | ICD-10-CM | POA: Diagnosis not present

## 2014-03-21 DIAGNOSIS — I10 Essential (primary) hypertension: Secondary | ICD-10-CM | POA: Diagnosis not present

## 2014-03-21 DIAGNOSIS — E559 Vitamin D deficiency, unspecified: Secondary | ICD-10-CM | POA: Diagnosis not present

## 2014-03-21 DIAGNOSIS — E119 Type 2 diabetes mellitus without complications: Secondary | ICD-10-CM | POA: Diagnosis not present

## 2014-03-21 DIAGNOSIS — I2510 Atherosclerotic heart disease of native coronary artery without angina pectoris: Secondary | ICD-10-CM | POA: Diagnosis not present

## 2014-03-30 ENCOUNTER — Ambulatory Visit (INDEPENDENT_AMBULATORY_CARE_PROVIDER_SITE_OTHER): Payer: 59

## 2014-03-30 DIAGNOSIS — I480 Paroxysmal atrial fibrillation: Secondary | ICD-10-CM | POA: Diagnosis not present

## 2014-03-30 DIAGNOSIS — Z7901 Long term (current) use of anticoagulants: Secondary | ICD-10-CM

## 2014-03-30 LAB — POCT INR: INR: 2

## 2014-05-04 ENCOUNTER — Ambulatory Visit: Payer: Medicare Other | Admitting: Cardiology

## 2014-05-30 ENCOUNTER — Encounter: Payer: Self-pay | Admitting: Cardiology

## 2014-06-01 ENCOUNTER — Encounter (HOSPITAL_COMMUNITY): Payer: Self-pay | Admitting: Cardiovascular Disease

## 2014-07-18 ENCOUNTER — Ambulatory Visit (INDEPENDENT_AMBULATORY_CARE_PROVIDER_SITE_OTHER): Payer: Medicare Other | Admitting: Cardiology

## 2014-07-18 ENCOUNTER — Ambulatory Visit (INDEPENDENT_AMBULATORY_CARE_PROVIDER_SITE_OTHER): Payer: Medicare Other | Admitting: *Deleted

## 2014-07-18 ENCOUNTER — Encounter: Payer: Self-pay | Admitting: Cardiology

## 2014-07-18 VITALS — BP 172/64 | HR 61 | Ht 62.0 in | Wt 164.0 lb

## 2014-07-18 DIAGNOSIS — I10 Essential (primary) hypertension: Secondary | ICD-10-CM

## 2014-07-18 DIAGNOSIS — I480 Paroxysmal atrial fibrillation: Secondary | ICD-10-CM

## 2014-07-18 DIAGNOSIS — E785 Hyperlipidemia, unspecified: Secondary | ICD-10-CM

## 2014-07-18 DIAGNOSIS — I050 Rheumatic mitral stenosis: Secondary | ICD-10-CM | POA: Diagnosis not present

## 2014-07-18 DIAGNOSIS — I5032 Chronic diastolic (congestive) heart failure: Secondary | ICD-10-CM

## 2014-07-18 DIAGNOSIS — I252 Old myocardial infarction: Secondary | ICD-10-CM | POA: Diagnosis not present

## 2014-07-18 DIAGNOSIS — Z7901 Long term (current) use of anticoagulants: Secondary | ICD-10-CM | POA: Diagnosis not present

## 2014-07-18 DIAGNOSIS — I2510 Atherosclerotic heart disease of native coronary artery without angina pectoris: Secondary | ICD-10-CM

## 2014-07-18 LAB — BASIC METABOLIC PANEL
BUN: 16 mg/dL (ref 6–23)
CO2: 27 mEq/L (ref 19–32)
CREATININE: 0.91 mg/dL (ref 0.40–1.20)
Calcium: 9.2 mg/dL (ref 8.4–10.5)
Chloride: 105 mEq/L (ref 96–112)
GFR: 63.18 mL/min (ref >60.00)
Glucose, Bld: 151 mg/dL — ABNORMAL HIGH (ref 70–99)
Potassium: 4.6 mEq/L (ref 3.5–5.1)
Sodium: 137 mEq/L (ref 135–145)

## 2014-07-18 LAB — POCT INR: INR: 2.1

## 2014-07-18 NOTE — Patient Instructions (Signed)
Your physician recommends that you continue on your current medications as directed. Please refer to the Current Medication list given to you today.  Your physician recommends that you schedule a follow-up blood pressure check on February 2nd at 9:00 AM.  Your physician wants you to follow-up in: 6 months with Dr. Radford Pax. You will receive a reminder letter in the mail two months in advance. If you don't receive a letter, please call our office to schedule the follow-up appointment.

## 2014-07-18 NOTE — Progress Notes (Signed)
Cardiology Office Note   Date:  07/18/2014   ID:  CARRI SPILLERS, DOB 1932/01/12, MRN [id number]  PCP:  Pcp Not In System  Cardiologist:   Sueanne Margarita, MD   Chief Complaint  Patient presents with  . Atrial Fibrillation  . Hypertension  . Mitral Stenosis  . Congestive Heart Failure      History of Present Illness: Ana [name] is a 81 y.o. female with a hx of PAFib, diastolic CHF, pulmonary HTN, mild mitral stenosis (echo 09/2012), HTN, HL, anemia of chronic disease. She has been kept on coumadin instead of NOAC due to valvular heart dsz. She was previously followed by the [facility] and est with me 10/2013. Admitted 12/2013 with NSTEMI c/b acute diastolic CHF. She was gently diuresed. Troponin peaked at 4.89. She had recurrent episodes of PAFib but converted to NSR. LHC demonstrated multivessel CAD. She underwent PCI with BMS to the LAD and CFX and subsequent staged PCI with BMS to the RCA. EF was preserved at 50%.At last OV with Richardson Dopp last fall her Plavix was stopped and she was continued on ASA/warfarin.  She returns for FU. She notes decreased mobility. She walks with a walker. She denies chest pain, shortness of breath, syncope, orthopnea, PND or significant pedal edema.    Past Medical History  Diagnosis Date  . Diabetes mellitus without complication   . PAF (paroxysmal atrial fibrillation)   . Hypertension   . Mitral stenosis     mild MS, mild LVF, grade II diastolic dysfunction,  normal LVF  . Chronic diastolic CHF (congestive heart failure)   . Pulmonary HTN     no evidence on last echo 2015  . Hypothyroidism   . Anemia of chronic disease   . Hyperlipidemia   . Vitamin D deficiency disease     Past Surgical History  Procedure Laterality Date  . Cataract extraction    . Left heart catheterization with coronary angiogram N/A 01/17/2014    Procedure: LEFT HEART CATHETERIZATION WITH CORONARY ANGIOGRAM;  Surgeon: Troy Sine, MD;  Location: [facility]  CATH LAB;  Service: Cardiovascular;  Laterality: N/A;  . Percutaneous coronary stent intervention (pci-s)  01/17/2014    Procedure: PERCUTANEOUS CORONARY STENT INTERVENTION (PCI-S);  Surgeon: Troy Sine, MD;  Location: [facility] CATH LAB;  Service: Cardiovascular;;  . Percutaneous coronary stent intervention (pci-s) N/A 01/19/2014    Procedure: PERCUTANEOUS CORONARY STENT INTERVENTION (PCI-S);  Surgeon: Leonie Man, MD;  Location: [facility] CATH LAB;  Service: Cardiovascular;  Laterality: N/A;     Current Outpatient Prescriptions  Medication Sig Dispense Refill  . aspirin 81 MG tablet Take 81 mg by mouth daily.    . Cholecalciferol (VITAMIN D3) 2000 UNITS TABS Take 2,000 Units by mouth daily.     . Coenzyme Q10 (CO Q 10) 10 MG CAPS Take 10 mg by mouth daily.     Marland Kitchen losartan (COZAAR) 100 MG tablet Take 1 tablet (100 mg total) by mouth daily. 90 tablet 3  . metFORMIN (GLUCOPHAGE) 1000 MG tablet Take 1 tablet (1,000 mg total) by mouth 2 (two) times daily with a meal. 60 tablet 0  . metoprolol (LOPRESSOR) 25 MG tablet Take 1 tablet (25 mg total) by mouth 2 (two) times daily.    . Omega-3 Krill Oil 500 MG CAPS Take 1 capsule by mouth daily.    Marland Kitchen warfarin (COUMADIN) 2.5 MG tablet Take as directed by coumadin clinic 35 tablet 3   No current facility-administered medications for this  visit.    Allergies:   Welchol; Zetia; Crestor; and Zocor    Social History:  The patient  reports that she has never smoked. She does not have any smokeless tobacco history on file. She reports that she does not drink alcohol or use illicit drugs.   Family History:  The patient's family history includes Cancer in her mother; Cirrhosis in her father; Diabetes in her brother; Heart attack in her sister; Heart disease in her sister.    ROS:  Please see the history of present illness.   Otherwise, review of systems are positive for none.   All other systems are reviewed and negative.    PHYSICAL EXAM: VS:  BP 172/64 mmHg   Pulse 61  Ht 5\' 2"  (1.575 m)  Wt 164 lb (74.39 kg)  BMI 29.99 kg/m2  SpO2 94% , BMI Body mass index is 29.99 kg/(m^2). GEN: Well nourished, well developed, in no acute distress HEENT: normal Neck: no JVD, carotid bruits, or masses Cardiac: RRR; no murmurs, rubs, or gallops,no edema  Respiratory:  clear to auscultation bilaterally, normal work of breathing GI: soft, nontender, nondistended, + BS MS: no deformity or atrophy Skin: warm and dry, no rash Neuro:  Strength and sensation are intact Psych: euthymic mood, full affect   EKG:  EKG is not ordered today.    Recent Labs: 01/14/2014: ALT 15; Magnesium 1.6; Pro B Natriuretic peptide (BNP) 3027.0*; TSH 2.490 01/20/2014: BUN 13; Creatinine 0.96; Potassium 4.2; Sodium 137 01/26/2014: Hemoglobin 10.1*; Platelets 346.0    Lipid Panel    Component Value Date/Time   CHOL 157 01/15/2014 0225   TRIG 95 01/15/2014 0225   HDL 47 01/15/2014 0225   CHOLHDL 3.3 01/15/2014 0225   VLDL 19 01/15/2014 0225   LDLCALC 91 01/15/2014 0225      Wt Readings from Last 3 Encounters:  07/18/14 164 lb (74.39 kg)  02/21/14 162 lb (73.483 kg)  01/20/14 166 lb 0.1 oz (75.3 kg)      Other studies Reviewed: Additional studies/ records that were reviewed today include: 2D echo. Review of the above records demonstrates: low normal LVF with mild MS and no pulmonary HTN   ASSESSMENT AND PLAN:  1. CAD s/p NSTEMI 12/2013: She is doing well after recent non-STEMI treated with multivessel PCI.  Continue beta blocker/ASA. She is intolerant of statins. 2. Chronic diastolic CHF (congestive heart failure): Volume stable. 3. PAF (paroxysmal atrial fibrillation): She is maintaining NSR. Continue current dose of beta blocker and Coumadin. 4. Essential hypertension: elevated today. Recheck today 168/45mmHg.  Continue Losartan and BB. She has not taken her am meds so I will bring her back for a BP check with nurse in 1 week.   Check BMET today. 5. HLD  (hyperlipidemia): She is intolerant of statins. Last LDL was 90 and her goal is <70. I will set her up with lipid clinic to see if she is a candidate for PCSK 9 drug. 6. Mitral stenosis: This was not evident on recent echocardiogram. 7. Pulmonary HTN: This was not evident on recent echocardiogram.    Current medicines are reviewed at length with the patient today.  The patient does not have concerns regarding medicines.  The following changes have been made:  no change  Labs/ tests ordered today include: BMET  No orders of the defined types were placed in this encounter.     Disposition:   FU with me in 6 months   Signed, Sueanne Margarita, MD  07/18/2014 9:25 AM  [facility] [address], [place], [place]  [address] Phone: [phone]; Fax: [phone]

## 2014-07-18 NOTE — Addendum Note (Signed)
Addended by: Fransico Him R on: 07/18/2014 02:32 PM   Modules accepted: Miquel Dunn

## 2014-07-25 ENCOUNTER — Ambulatory Visit (INDEPENDENT_AMBULATORY_CARE_PROVIDER_SITE_OTHER): Payer: Medicare Other | Admitting: *Deleted

## 2014-07-25 VITALS — BP 163/69 | HR 60 | Wt 161.4 lb

## 2014-07-25 DIAGNOSIS — I10 Essential (primary) hypertension: Secondary | ICD-10-CM | POA: Diagnosis not present

## 2014-07-25 DIAGNOSIS — I2510 Atherosclerotic heart disease of native coronary artery without angina pectoris: Secondary | ICD-10-CM | POA: Diagnosis not present

## 2014-07-25 MED ORDER — AMLODIPINE BESYLATE 5 MG PO TABS: 5.0000 mg | ORAL_TABLET | Freq: Every day | ORAL | Status: DC

## 2014-07-25 NOTE — Progress Notes (Signed)
Patient in for BP check. BP 163/69, regular HR 60. Weight down 3# from last week. Patient denies SOB, dyspnea, dizziness or headache. Patient reports no swelling or edema noted. Patient indicates that she has a difficult time watching the salt in her diet, as she lives at a residential assisted living center [facility]) and they control all the food at the [place] area. She does not add extra salt but says they serve a lot of pork and sausage. She states they have asked them before to reduce the salt in the food. Patient requested a list of preferred foods that she can eat that would be better for her overall. Nurse educated patient and her spouse on pre-prepared food/canned/boxed/packaged food and advised that she should stick with fresh vegetables (or steamed/grilled/boiled) as opposed to beans, canned soups and packaged dinner type foods like pasta. Nurse will put this in writing and mail to patient also as a reference list. Dr. Radford Pax reviewed BP and ordered Amlodipine 5 mg by mouth daily and for patient to return in one week for BP check. Patient will come back on 08/03/2014 for BP check. Will mail appointment date/time also so that patient will have it in writing as a reference.

## 2014-08-03 ENCOUNTER — Ambulatory Visit (INDEPENDENT_AMBULATORY_CARE_PROVIDER_SITE_OTHER): Payer: Medicare Other

## 2014-08-03 VITALS — BP 158/78 | HR 64 | Resp 14 | Wt 161.0 lb

## 2014-08-03 DIAGNOSIS — I10 Essential (primary) hypertension: Secondary | ICD-10-CM | POA: Diagnosis not present

## 2014-08-03 NOTE — Progress Notes (Signed)
1.) Reason for visit: BP check  2.) Name of MD requesting visit: Dr. Radford Pax  3.) H&P: Patient has a history of DM, PAF, HTN, Mitral stenosis, Chronic diastolic CHF, and hyperlipidemia. Patient's BP today was 158/78 HR 64, and Resp. 14.  4.) ROS related to problem: Patient's BP is still elevated at 158/78. Patient stated they never started Amlodpine 5 mg daily that was order for them last nurses visit (07/25/14).  5.) Assessment and plan per MD: Dr. Radford Pax wants patient to start Amlodpine 5 mg by mouth daily and then have BP checked again in one week. Patient was informed to get her medication at pharmacy at [facility] on [address]. Also, scheduled the patient another BP check with nurse on Thursday next week.  Agree with above recommendations  Signed: Fransico Him, MD

## 2014-08-03 NOTE — Patient Instructions (Signed)
Your physician has recommended you make the following change in your medication:   Start Amlodipine 5 mg by mouth daily.        You have a coumadin clinic appointment on February 23rd at 9:00 am  Your physician recommends that you schedule a follow-up appointment in: 1 week with nurse for Blood Pressure check on February 18th at 9:00 am, this is Thursday.

## 2014-08-10 ENCOUNTER — Ambulatory Visit (INDEPENDENT_AMBULATORY_CARE_PROVIDER_SITE_OTHER): Payer: Medicare Other | Admitting: Pharmacist

## 2014-08-10 ENCOUNTER — Encounter: Payer: Self-pay | Admitting: Pharmacist

## 2014-08-10 VITALS — BP 156/62 | HR 61

## 2014-08-10 DIAGNOSIS — I10 Essential (primary) hypertension: Secondary | ICD-10-CM

## 2014-08-10 NOTE — Patient Instructions (Addendum)
It was a pleasure meeting you today.  Continue taking all your medicines as directed Begin checking your blood pressure at least 4 times each week and record your results on the blood pressure log Follow-up telephone visit with me on 09/06/2014 @ 2pm I will get your blood pressure numbers from you during our phone call If you have any questions, please call me at [phone]  HOW TO TAKE YOUR BLOOD PRESSURE: -Rest 5 minutes before taking your blood pressure -Do not drink caffeinated beverages for at least 30 minutes before -Take your blood pressure before (not after) you eat -Sit comfortably with your back supported and both feet on the floor (don't cross your legs) -Elevate your arm to heart level on a table or a desk -There should be enough room to slip a fingertip under the cuff. The bottom edge of the cuff should be 1 inch above the crease of the elbow -Ideally, take 3 measurements at one sitting and record the average

## 2014-08-10 NOTE — Progress Notes (Signed)
Pharmacist Hypertension Clinic - Resident Research Study  S/O: Ana [name] is a 81 y.o. white female presenting to clinic today for initial hypertension clinic visit and study enrollment, referred to hypertension clinic by Dr. Radford Pax.  Pt has PMH significant for CAD (s/p NSTEMI 12/2013), chronic diastolic CHF, PAF, HTN, HLD, DM, mitral stenosis (not evident on recent ECHO), and pulmonary HTN (not evident on recent ECHO).   Pt presents today w/ husband, who is also involved in her care and helps keep her compliant w/ meds.  The couple live in an assisted living community [facility]) and have limited choices related to diet, but they report never adding salt to food. See RN note from 07/25/14 regarding diet recommendations.  She has limited mobility and uses a walker.  Current HTN meds: Amlodipine 5mg  daily (just obtained and began taking 08/09/14) Losartan 100mg  daily Metoprolol 25mg  BID  FH: Diabetes in her brother; Heart attack in her sister; Heart disease in her sister; otherwise noncontributory for CV disease  SH: Pt does not smoke, denies alcohol use  Wt Readings from Last 3 Encounters:  08/03/14 161 lb (73.029 kg)  07/25/14 161 lb 6.4 oz (73.211 kg)  07/18/14 164 lb (74.39 kg)   BP Readings from Last 3 Encounters:  08/10/14 156/62  08/03/14 158/78  07/25/14 163/69   Pulse Readings from Last 3 Encounters:  08/10/14 61  08/03/14 64  07/25/14 60    Renal function: CrCl ~52 mL/min, lytes ok  Outpatient Encounter Prescriptions as of 08/10/2014  Medication Sig  . amLODipine (NORVASC) 5 MG tablet Take 1 tablet (5 mg total) by mouth daily.  Marland Kitchen aspirin 81 MG tablet Take 81 mg by mouth daily.  . Cholecalciferol (VITAMIN D3) 2000 UNITS TABS Take 2,000 Units by mouth daily.   . Coenzyme Q10 (CO Q 10) 10 MG CAPS Take 10 mg by mouth daily.   Marland Kitchen losartan (COZAAR) 100 MG tablet Take 1 tablet (100 mg total) by mouth daily.  . metFORMIN (GLUCOPHAGE) 1000 MG tablet Take 1 tablet (1,000  mg total) by mouth 2 (two) times daily with a meal.  . metoprolol (LOPRESSOR) 25 MG tablet Take 1 tablet (25 mg total) by mouth 2 (two) times daily.  . Omega-3 Krill Oil 500 MG CAPS Take 1 capsule by mouth daily.  Marland Kitchen warfarin (COUMADIN) 2.5 MG tablet Take as directed by coumadin clinic    Allergies: Allergies  Allergen Reactions  . Welchol [Colesevelam Hcl] Nausea Only  . Zetia [Ezetimibe] Other (See Comments)    Edema   . Crestor [Rosuvastatin] Other (See Comments)    Unknown  . Zocor [Simvastatin] Other (See Comments)    Muscle weakness    A/P: Pt has qualified for study enrollment and has signed informed consent. Pt was randomized to study group: Home BP and telephone F/U  Pt is not at BP goal of < 140/85 mmHg (goal per Dr. Radford Pax).  She was prescribed amlodipine 5mg  daily starting on 07/25/14, but pt just obtained and began taking on 08/09/14.  Her BP appears to be slowly trending down since her office visit w/ Dr. Radford Pax on 07/18/14.  Continue taking all meds as prescribed Starting checking BP at least 4x/wk and record results F/U w/ me via telephone in 30mo  Future visits may consider increasing amlodipine if additional BP lowering is needed.  Diet and lifestyle recommendations will likely have limited role in this pt's BP d/t living situation and limited mobility.  Drucie Opitz, PharmD Clinical Pharmacy Resident

## 2014-08-15 ENCOUNTER — Ambulatory Visit (INDEPENDENT_AMBULATORY_CARE_PROVIDER_SITE_OTHER): Payer: Medicare Other | Admitting: Pharmacist

## 2014-08-15 DIAGNOSIS — Z7901 Long term (current) use of anticoagulants: Secondary | ICD-10-CM

## 2014-08-15 DIAGNOSIS — I480 Paroxysmal atrial fibrillation: Secondary | ICD-10-CM | POA: Diagnosis not present

## 2014-08-15 LAB — POCT INR: INR: 1.8

## 2014-09-05 ENCOUNTER — Ambulatory Visit (INDEPENDENT_AMBULATORY_CARE_PROVIDER_SITE_OTHER): Payer: Medicare Other | Admitting: Pharmacist Clinician (PhC)/ Clinical Pharmacy Specialist

## 2014-09-05 DIAGNOSIS — Z7901 Long term (current) use of anticoagulants: Secondary | ICD-10-CM | POA: Diagnosis not present

## 2014-09-05 DIAGNOSIS — I480 Paroxysmal atrial fibrillation: Secondary | ICD-10-CM | POA: Diagnosis not present

## 2014-09-05 LAB — POCT INR: INR: 2.2

## 2014-09-13 ENCOUNTER — Telehealth: Payer: Self-pay | Admitting: Pharmacist

## 2014-09-13 DIAGNOSIS — I10 Essential (primary) hypertension: Secondary | ICD-10-CM

## 2014-09-13 NOTE — Telephone Encounter (Signed)
Pharmacist Hypertension Clinic - Resident Research Study  S/O: Ana [name] is a 81 y.o. white female.  I am calling pt today for 4wk follow-up for HTN study, referred to hypertension clinic by Dr. Radford Pax.  Pt has PMH significant for CAD (s/p NSTEMI 12/2013), chronic diastolic CHF, PAF, HTN, HLD, DM, mitral stenosis (not evident on recent ECHO), and pulmonary HTN (not evident on recent ECHO).   Pt lives in an assisted living community [facility]) and has limited choices related to diet, but reports never adding salt to food. See RN note from 07/25/14 regarding diet recommendations.  She has limited mobility and uses a walker.  Current HTN meds: Amlodipine 5mg  daily Losartan 100mg  daily Metoprolol tartrate 25mg  BID  FH: Diabetes in her brother; Heart attack in her sister; Heart disease in her sister; otherwise noncontributory for CV disease  SH: Pt does not smoke, denies alcohol use  Wt Readings from Last 3 Encounters:  08/03/14 161 lb (73.029 kg)  07/25/14 161 lb 6.4 oz (73.211 kg)  07/18/14 164 lb (74.39 kg)   BP Readings from Last 3 Encounters:  09/13/14 133/64  08/10/14 156/62  08/03/14 158/78   Pulse Readings from Last 3 Encounters:  08/10/14 61  08/03/14 64  07/25/14 60    Renal function: CrCl ~52 mL/min, lytes ok  Outpatient Encounter Prescriptions as of 09/13/2014  Medication Sig  . amLODipine (NORVASC) 5 MG tablet Take 1 tablet (5 mg total) by mouth daily.  Marland Kitchen aspirin 81 MG tablet Take 81 mg by mouth daily.  . Cholecalciferol (VITAMIN D3) 2000 UNITS TABS Take 2,000 Units by mouth daily.   . Coenzyme Q10 (CO Q 10) 10 MG CAPS Take 10 mg by mouth daily.   Marland Kitchen losartan (COZAAR) 100 MG tablet Take 1 tablet (100 mg total) by mouth daily.  . metFORMIN (GLUCOPHAGE) 1000 MG tablet Take 1 tablet (1,000 mg total) by mouth 2 (two) times daily with a meal.  . metoprolol (LOPRESSOR) 25 MG tablet Take 1 tablet (25 mg total) by mouth 2 (two) times daily.  . Omega-3 Krill Oil 500  MG CAPS Take 1 capsule by mouth daily.  Marland Kitchen warfarin (COUMADIN) 2.5 MG tablet Take as directed by coumadin clinic    Allergies: Allergies  Allergen Reactions  . Welchol [Colesevelam Hcl] Nausea Only  . Zetia [Ezetimibe] Other (See Comments)    Edema   . Crestor [Rosuvastatin] Other (See Comments)    Unknown  . Zocor [Simvastatin] Other (See Comments)    Muscle weakness    A/P: Pt has qualified for study enrollment and has signed informed consent. Pt was randomized to study group: Home BP and telephone F/U  Pt is at BP goal of < 140/85 mmHg (goal per Dr. Radford Pax).  Pt's BP has improved dramatically with addition of amlodipine 5mg  daily (started 08/09/2014).  Continue taking all meds as prescribed Continue checking BP at least 4x/wk and record results F/U for final study visit in clinic in [place] (will schedule during future Coumadin Clinic visit)  Diet and lifestyle recommendations will likely have limited role in this pt's BP d/t living situation and limited mobility.  Drucie Opitz, PharmD Clinical Pharmacy Resident

## 2014-09-26 ENCOUNTER — Ambulatory Visit (INDEPENDENT_AMBULATORY_CARE_PROVIDER_SITE_OTHER): Payer: Medicare Other

## 2014-09-26 DIAGNOSIS — I480 Paroxysmal atrial fibrillation: Secondary | ICD-10-CM | POA: Diagnosis not present

## 2014-09-26 DIAGNOSIS — Z7901 Long term (current) use of anticoagulants: Secondary | ICD-10-CM

## 2014-09-26 LAB — POCT INR: INR: 1.9

## 2014-10-04 ENCOUNTER — Emergency Department (HOSPITAL_COMMUNITY)
Admission: EM | Admit: 2014-10-04 | Discharge: 2014-10-04 | Disposition: A | Discharge status: Home / Self Care | Payer: Medicare Other | Attending: Emergency Medicine | Admitting: Emergency Medicine

## 2014-10-04 ENCOUNTER — Encounter (HOSPITAL_COMMUNITY): Payer: Self-pay | Admitting: Emergency Medicine

## 2014-10-04 ENCOUNTER — Emergency Department (HOSPITAL_COMMUNITY): Payer: Medicare Other

## 2014-10-04 DIAGNOSIS — S8001XA Contusion of right knee, initial encounter: Secondary | ICD-10-CM | POA: Insufficient documentation

## 2014-10-04 DIAGNOSIS — S8391XA Sprain of unspecified site of right knee, initial encounter: Secondary | ICD-10-CM | POA: Diagnosis not present

## 2014-10-04 DIAGNOSIS — M25461 Effusion, right knee: Secondary | ICD-10-CM | POA: Diagnosis not present

## 2014-10-04 DIAGNOSIS — M25561 Pain in right knee: Secondary | ICD-10-CM

## 2014-10-04 DIAGNOSIS — Y998 Other external cause status: Secondary | ICD-10-CM | POA: Insufficient documentation

## 2014-10-04 DIAGNOSIS — I5032 Chronic diastolic (congestive) heart failure: Secondary | ICD-10-CM | POA: Diagnosis not present

## 2014-10-04 DIAGNOSIS — Z9889 Other specified postprocedural states: Secondary | ICD-10-CM | POA: Insufficient documentation

## 2014-10-04 DIAGNOSIS — E119 Type 2 diabetes mellitus without complications: Secondary | ICD-10-CM | POA: Diagnosis not present

## 2014-10-04 DIAGNOSIS — Y9289 Other specified places as the place of occurrence of the external cause: Secondary | ICD-10-CM | POA: Diagnosis not present

## 2014-10-04 DIAGNOSIS — S60212A Contusion of left wrist, initial encounter: Secondary | ICD-10-CM | POA: Insufficient documentation

## 2014-10-04 DIAGNOSIS — S6992XA Unspecified injury of left wrist, hand and finger(s), initial encounter: Secondary | ICD-10-CM | POA: Diagnosis not present

## 2014-10-04 DIAGNOSIS — M79661 Pain in right lower leg: Secondary | ICD-10-CM | POA: Diagnosis not present

## 2014-10-04 DIAGNOSIS — Z79899 Other long term (current) drug therapy: Secondary | ICD-10-CM | POA: Diagnosis not present

## 2014-10-04 DIAGNOSIS — Z862 Personal history of diseases of the blood and blood-forming organs and certain disorders involving the immune mechanism: Secondary | ICD-10-CM | POA: Diagnosis not present

## 2014-10-04 DIAGNOSIS — S8392XA Sprain of unspecified site of left knee, initial encounter: Secondary | ICD-10-CM | POA: Diagnosis not present

## 2014-10-04 DIAGNOSIS — S8991XA Unspecified injury of right lower leg, initial encounter: Secondary | ICD-10-CM | POA: Diagnosis not present

## 2014-10-04 DIAGNOSIS — N390 Urinary tract infection, site not specified: Secondary | ICD-10-CM | POA: Diagnosis not present

## 2014-10-04 DIAGNOSIS — M25532 Pain in left wrist: Secondary | ICD-10-CM | POA: Diagnosis not present

## 2014-10-04 DIAGNOSIS — Z7982 Long term (current) use of aspirin: Secondary | ICD-10-CM | POA: Insufficient documentation

## 2014-10-04 DIAGNOSIS — S0990XA Unspecified injury of head, initial encounter: Secondary | ICD-10-CM

## 2014-10-04 DIAGNOSIS — S63502A Unspecified sprain of left wrist, initial encounter: Secondary | ICD-10-CM | POA: Diagnosis not present

## 2014-10-04 DIAGNOSIS — I10 Essential (primary) hypertension: Secondary | ICD-10-CM | POA: Diagnosis not present

## 2014-10-04 DIAGNOSIS — W19XXXA Unspecified fall, initial encounter: Secondary | ICD-10-CM

## 2014-10-04 DIAGNOSIS — W01198A Fall on same level from slipping, tripping and stumbling with subsequent striking against other object, initial encounter: Secondary | ICD-10-CM | POA: Insufficient documentation

## 2014-10-04 DIAGNOSIS — Y9301 Activity, walking, marching and hiking: Secondary | ICD-10-CM | POA: Insufficient documentation

## 2014-10-04 DIAGNOSIS — E559 Vitamin D deficiency, unspecified: Secondary | ICD-10-CM | POA: Insufficient documentation

## 2014-10-04 DIAGNOSIS — I252 Old myocardial infarction: Secondary | ICD-10-CM | POA: Insufficient documentation

## 2014-10-04 DIAGNOSIS — Z7901 Long term (current) use of anticoagulants: Secondary | ICD-10-CM | POA: Diagnosis not present

## 2014-10-04 DIAGNOSIS — E039 Hypothyroidism, unspecified: Secondary | ICD-10-CM | POA: Diagnosis not present

## 2014-10-04 DIAGNOSIS — T148XXA Other injury of unspecified body region, initial encounter: Secondary | ICD-10-CM

## 2014-10-04 LAB — URINALYSIS, ROUTINE W REFLEX MICROSCOPIC
BILIRUBIN URINE: NEGATIVE
GLUCOSE, UA: 500 mg/dL — AB
KETONES UR: NEGATIVE mg/dL
Nitrite: NEGATIVE
Protein, ur: NEGATIVE mg/dL
Specific Gravity, Urine: 1.006 (ref 1.005–1.030)
Urobilinogen, UA: 0.2 mg/dL (ref 0.0–1.0)
pH: 7.5 (ref 5.0–8.0)

## 2014-10-04 LAB — PROTIME-INR
INR: 2.57 — ABNORMAL HIGH (ref 0.00–1.49)
Prothrombin Time: 27.8 seconds — ABNORMAL HIGH (ref 11.6–15.2)

## 2014-10-04 LAB — URINE MICROSCOPIC-ADD ON

## 2014-10-04 MED ORDER — CEPHALEXIN 500 MG PO CAPS: 500.0000 mg | ORAL_CAPSULE | Freq: Two times a day (BID) | ORAL | Status: DC

## 2014-10-04 NOTE — Discharge Instructions (Signed)
Wear knee sleeve and wrist splint as needed for comfort for the next 1-2 weeks. Ice and elevate knee and wrist throughout the day. Alternate between ibuprofen and tylenol for pain. Call orthopedic follow up today or tomorrow to schedule followup appointment for recheck of ongoing knee or wrist pain in one to two weeks that can be canceled with a 24-48 hour notice if complete resolution of pain.  Stay very well hydrated with plenty of water throughout the day. Take antibiotic until completed. Call your warfarin doctor tomorrow to ask if you should change your dosing while taking antibiotics. Follow up with primary care physician in 1 week for recheck of ongoing symptoms but return to ER for emergent changing or worsening of symptoms.  Please seek immediate care if you develop the following: You develop back pain.  Your symptoms are no better, or worse in 3 days. There is severe back pain or lower abdominal pain.  You develop chills.  You have a fever.  There is nausea or vomiting.  There is continued burning or discomfort with urination.     Contusion A contusion is a deep bruise. Contusions happen when an injury causes bleeding under the skin. Signs of bruising include pain, puffiness (swelling), and discolored skin. The contusion may turn blue, purple, or yellow. HOME CARE   Put ice on the injured area.  Put ice in a plastic bag.  Place a towel between your skin and the bag.  Leave the ice on for 15-20 minutes, 03-04 times a day.  Only take medicine as told by your doctor.  Rest the injured area.  If possible, raise (elevate) the injured area to lessen puffiness. GET HELP RIGHT AWAY IF:   You have more bruising or puffiness.  You have pain that is getting worse.  Your puffiness or pain is not helped by medicine. MAKE SURE YOU:   Understand these instructions.  Will watch your condition.  Will get help right away if you are not doing well or get worse. Document Released:  11/26/2007 Document Revised: 09/01/2011 Document Reviewed: 04/14/2011 [facility] Patient Information 2015 [place], [place]. This information is not intended to replace advice given to you by your health care provider. Make sure you discuss any questions you have with your health care provider.  Cryotherapy Cryotherapy is when you put ice on your injury. Ice helps lessen pain and puffiness (swelling) after an injury. Ice works the best when you start using it in the first 24 to 48 hours after an injury. HOME CARE  Put a dry or damp towel between the ice pack and your skin.  You may press gently on the ice pack.  Leave the ice on for no more than 10 to 20 minutes at a time.  Check your skin after 5 minutes to make sure your skin is okay.  Rest at least 20 minutes between ice pack uses.  Stop using ice when your skin loses feeling (numbness).  Do not use ice on someone who cannot tell you when it hurts. This includes small children and people with memory problems (dementia). GET HELP RIGHT AWAY IF:  You have white spots on your skin.  Your skin turns blue or pale.  Your skin feels waxy or hard.  Your puffiness gets worse. MAKE SURE YOU:   Understand these instructions.  Will watch your condition.  Will get help right away if you are not doing well or get worse. Document Released: 11/26/2007 Document Revised: 09/01/2011 Document Reviewed: 01/30/2011 [employer]  Patient Information 2015 [place]. This information is not intended to replace advice given to you by your health care provider. Make sure you discuss any questions you have with your health care provider.  Musculoskeletal Pain Musculoskeletal pain is muscle and boney aches and pains. These pains can occur in any part of the body. Your caregiver may treat you without knowing the cause of the pain. They may treat you if blood or urine tests, X-rays, and other tests were normal.  CAUSES There is often not a definite cause  or reason for these pains. These pains may be caused by a type of germ (virus). The discomfort may also come from overuse. Overuse includes working out too hard when your body is not fit. Boney aches also come from weather changes. Bone is sensitive to atmospheric pressure changes. HOME CARE INSTRUCTIONS   Ask when your test results will be ready. Make sure you get your test results.  Only take over-the-counter or prescription medicines for pain, discomfort, or fever as directed by your caregiver. If you were given medications for your condition, do not drive, operate machinery or power tools, or sign legal documents for 24 hours. Do not drink alcohol. Do not take sleeping pills or other medications that may interfere with treatment.  Continue all activities unless the activities cause more pain. When the pain lessens, slowly resume normal activities. Gradually increase the intensity and duration of the activities or exercise.  During periods of severe pain, bed rest may be helpful. Lay or sit in any position that is comfortable.  Putting ice on the injured area.  Put ice in a bag.  Place a towel between your skin and the bag.  Leave the ice on for 15 to 20 minutes, 3 to 4 times a day.  Follow up with your caregiver for continued problems and no reason can be found for the pain. If the pain becomes worse or does not go away, it may be necessary to repeat tests or do additional testing. Your caregiver may need to look further for a possible cause. SEEK IMMEDIATE MEDICAL CARE IF:  You have pain that is getting worse and is not relieved by medications.  You develop chest pain that is associated with shortness or breath, sweating, feeling sick to your stomach (nauseous), or throw up (vomit).  Your pain becomes localized to the abdomen.  You develop any new symptoms that seem different or that concern you. MAKE SURE YOU:   Understand these instructions.  Will watch your condition.  Will  get help right away if you are not doing well or get worse. Document Released: 06/09/2005 Document Revised: 09/01/2011 Document Reviewed: 02/11/2013 [facility] Patient Information 2015 [place], [place]. This information is not intended to replace advice given to you by your health care provider. Make sure you discuss any questions you have with your health care provider.  Urinary Tract Infection A urinary tract infection (UTI) can occur any place along the urinary tract. The tract includes the kidneys, ureters, bladder, and urethra. A type of germ called bacteria often causes a UTI. UTIs are often helped with antibiotic medicine.  HOME CARE   If given, take antibiotics as told by your doctor. Finish them even if you start to feel better.  Drink enough fluids to keep your pee (urine) clear or pale yellow.  Avoid tea, drinks with caffeine, and bubbly (carbonated) drinks.  Pee often. Avoid holding your pee in for a long time.  Pee before and after having  sex (intercourse).  Wipe from front to back after you poop (bowel movement) if you are a woman. Use each tissue only once. GET HELP RIGHT AWAY IF:   You have back pain.  You have lower belly (abdominal) pain.  You have chills.  You feel sick to your stomach (nauseous).  You throw up (vomit).  Your burning or discomfort with peeing does not go away.  You have a fever.  Your symptoms are not better in 3 days. MAKE SURE YOU:   Understand these instructions.  Will watch your condition.  Will get help right away if you are not doing well or get worse. Document Released: 11/26/2007 Document Revised: 03/03/2012 Document Reviewed: 01/08/2012 [facility] Patient Information 2015 [place], [place]. This information is not intended to replace advice given to you by your health care provider. Make sure you discuss any questions you have with your health care provider.

## 2014-10-04 NOTE — Progress Notes (Signed)
pcp is [facility] @ [address] [place], [place], [place] [address]  [phone]

## 2014-10-04 NOTE — ED Notes (Signed)
Pt able to ambulate with walker, more assistance required than normal. Pt states she feels more unsteady than her normal.

## 2014-10-04 NOTE — ED Provider Notes (Signed)
CSN: [id number]     Arrival date & time 10/04/14  1509 History   First MD Initiated Contact with Patient 10/04/14 1534     Chief Complaint  Patient presents with  . Fall     (Consider location/radiation/quality/duration/timing/severity/associated sxs/prior Treatment) HPI Comments: Ana [name] is a 81 y.o. female with a PMHx of DM2, paroxysmal afib on coumadin, HTN, mitral stenosis, CHF, hypothyroidism, anemia of chronic disease, HLD, and vitamin D deficiency, who presents to the ED with complaints of mechanical fall that occurred at approximately 2:30 PM while she was turning to grab her walker and tripped over her walker. She reports she fell onto her right knee, and left wrist. She reports pain in these areas that she states is a 2/10 sore, nonradiating pain only occurring with movement of the joint or ambulating, and improved with rest. Endorses slight swelling to L wrist and R knee. She has not tried any medications prior to arrival. She endorses that she hit her head on the right backside, but denies any vision changes, headache, or loss of consciousness. Denies any dizziness or lightheadedness prior to the fall or after the fall. Additionally she states that for "a while" she's had dysuria and increased urinary frequency and has not yet seen the doctor for this complaint. Denies any recent fevers, chills, chest pain, shortness breath, abdominal pain, nausea, vomiting, diarrhea, pelvic pain, pain in other joints, neck pain, back pain, hematuria, vaginal bleeding or discharge, myalgias, skin wounds/abrasions, numbness, tingling, weakness, vision changes, headache, or syncope.  Patient is a 81 y.o. female presenting with fall. The history is provided by the patient. No language interpreter was used.  Fall This is a new problem. The current episode started today. The problem occurs rarely. The problem has been unchanged. Associated symptoms include arthralgias (L wrist, R knee/leg), joint swelling  (L wrist, R knee) and urinary symptoms (for "a while"). Pertinent negatives include no abdominal pain, chest pain, chills, fever, headaches, myalgias, nausea, neck pain, numbness, vertigo, visual change, vomiting or weakness. The symptoms are aggravated by walking and bending (movement of joints, walking). She has tried rest for the symptoms. The treatment provided significant relief.    Past Medical History  Diagnosis Date  . Diabetes mellitus without complication   . PAF (paroxysmal atrial fibrillation)   . Hypertension   . Mitral stenosis echo 2015    resolved by echo 2015  . Chronic diastolic CHF (congestive heart failure)   . Pulmonary HTN     no evidence on last echo 2015  . Hypothyroidism   . Anemia of chronic disease   . Hyperlipidemia   . Vitamin D deficiency disease   . MI, old 07/18/2014   Past Surgical History  Procedure Laterality Date  . Cataract extraction    . Left heart catheterization with coronary angiogram N/A 01/17/2014    Procedure: LEFT HEART CATHETERIZATION WITH CORONARY ANGIOGRAM;  Surgeon: Troy Sine, MD;  Location: [facility] CATH LAB;  Service: Cardiovascular;  Laterality: N/A;  . Percutaneous coronary stent intervention (pci-s)  01/17/2014    Procedure: PERCUTANEOUS CORONARY STENT INTERVENTION (PCI-S);  Surgeon: Troy Sine, MD;  Location: [facility] CATH LAB;  Service: Cardiovascular;;  . Percutaneous coronary stent intervention (pci-s) N/A 01/19/2014    Procedure: PERCUTANEOUS CORONARY STENT INTERVENTION (PCI-S);  Surgeon: Leonie Man, MD;  Location: [facility] CATH LAB;  Service: Cardiovascular;  Laterality: N/A;   Family History  Problem Relation Age of Onset  . Cancer Mother   . Cirrhosis Father   .  Heart attack Sister   . Heart disease Sister   . Diabetes Brother    History  Substance Use Topics  . Smoking status: Never Smoker   . Smokeless tobacco: Not on file  . Alcohol Use: No   OB History    No data available     Review of Systems  Constitutional:  Negative for fever and chills.  Eyes: Negative for visual disturbance.  Respiratory: Negative for shortness of breath.   Cardiovascular: Negative for chest pain.  Gastrointestinal: Negative for nausea, vomiting, abdominal pain and diarrhea.  Genitourinary: Positive for dysuria and frequency. Negative for hematuria, flank pain, vaginal bleeding and vaginal discharge.  Musculoskeletal: Positive for joint swelling (L wrist, R knee) and arthralgias (L wrist, R knee/leg). Negative for myalgias, back pain and neck pain.  Skin: Positive for color change (bruising). Negative for wound.  Neurological: Negative for dizziness, vertigo, syncope, weakness, light-headedness, numbness and headaches.  Hematological: Bruises/bleeds easily (on coumadin).  Psychiatric/Behavioral: Negative for confusion.   10 Systems reviewed and are negative for acute change except as noted in the HPI.    Allergies  Welchol; Zetia; Crestor; and Zocor  Home Medications   Prior to Admission medications   Medication Sig Start Date End Date Taking? Authorizing Provider  amLODipine (NORVASC) 5 MG tablet Take 1 tablet (5 mg total) by mouth daily. 07/25/14  Yes Sueanne Margarita, MD  aspirin 81 MG tablet Take 81 mg by mouth at bedtime.    Yes Historical Provider, MD  Cholecalciferol (VITAMIN D3) 2000 UNITS TABS Take 2,000 Units by mouth at bedtime.    Yes Historical Provider, MD  Coenzyme Q10 (CO Q 10) 10 MG CAPS Take 10 mg by mouth at bedtime.    Yes Historical Provider, MD  losartan (COZAAR) 100 MG tablet Take 1 tablet (100 mg total) by mouth daily. 01/06/14  Yes Sueanne Margarita, MD  metFORMIN (GLUCOPHAGE) 1000 MG tablet Take 1 tablet (1,000 mg total) by mouth 2 (two) times daily with a meal. 02/16/14  Yes Sueanne Margarita, MD  metoprolol (LOPRESSOR) 25 MG tablet Take 1 tablet (25 mg total) by mouth 2 (two) times daily. 11/03/13  Yes Sueanne Margarita, MD  Omega-3 Krill Oil 500 MG CAPS Take 1 capsule by mouth at bedtime.    Yes Historical  Provider, MD  warfarin (COUMADIN) 2.5 MG tablet Take as directed by coumadin clinic 03/16/14  Yes Sueanne Margarita, MD   BP 155/58 mmHg  Pulse 70  Temp(Src) 98.4 F (36.9 C) (Oral)  Resp 17  SpO2 99% Physical Exam  Constitutional: She is oriented to person, place, and time. Vital signs are normal. She appears well-developed and well-nourished.  Non-toxic appearance. No distress.  Afebrile, nontoxic, NAD  HENT:  Head: Normocephalic and atraumatic. Head is without raccoon's eyes, without Battle's sign, without abrasion, without contusion and without laceration.    Mouth/Throat: Oropharynx is clear and moist and mucous membranes are normal.  Mild TTP to R occiput, no bony crepitus or contusions, no abrasions/lacerations, no raccoon eyes or battle's sign.  Eyes: Conjunctivae and EOM are normal. Pupils are equal, round, and reactive to light. Right eye exhibits no discharge. Left eye exhibits no discharge.  PERRL, EOMI, no nystagmus, no visual field deficits   Neck: Normal range of motion. Neck supple. No spinous process tenderness and no muscular tenderness present. No rigidity. Normal range of motion present.  FROM intact without spinous process or paraspinous muscle TTP, no bony stepoffs or deformities, no muscle  spasms.  Cardiovascular: Normal rate, regular rhythm, normal heart sounds and intact distal pulses.  Exam reveals no gallop and no friction rub.   No murmur heard. Pulmonary/Chest: Effort normal and breath sounds normal. No respiratory distress. She has no decreased breath sounds. She has no wheezes. She has no rhonchi. She has no rales.  Abdominal: Soft. Normal appearance and bowel sounds are normal. She exhibits no distension. There is no tenderness. There is no rigidity, no rebound, no guarding and no CVA tenderness.  Musculoskeletal:       Left elbow: Normal.       Left wrist: She exhibits decreased range of motion (due to pain), tenderness, bony tenderness and swelling. She  exhibits no deformity.       Right hip: Normal.       Left hip: Normal.       Right knee: She exhibits decreased range of motion (due to pain), swelling, ecchymosis and bony tenderness. She exhibits no deformity, no laceration, normal alignment, no LCL laxity and no MCL laxity. Tenderness found.       Right ankle: Normal.       Cervical back: Normal.       Thoracic back: Normal.       Lumbar back: Normal.       Legs: R knee with limited ROM due to pain, diffuse joint line and bony TTP throughout with mild swelling and bruising, no deformity, no abnormal alignment or patellar mobility, no varus/valgus laxity, neg anterior drawer test, no crepitus. No skin injury.  All spinal levels with FROM intact without spinous process TTP, no bony stepoffs or deformities, no paraspinous muscle TTP or muscle spasms. No overlying skin changes.  No pain with pelvic squeeze test R ankle without TTP or deformity, no swelling L wrist with limited ROM due to pain, mildly TTP over carpal bones diffusely, and along distal radius, no tenderness over forearm or elbow, mild swelling to L wrist with slight bruising, no deformity or lacerations.  Strength 5/5 in all extremities, sensation grossly intact in all extremities.   Neurological: She is alert and oriented to person, place, and time. She has normal strength. No sensory deficit.  Skin: Skin is warm, dry and intact. No rash noted.  Skin intact, with bruises as noted above  Psychiatric: She has a normal mood and affect.  Nursing note and vitals reviewed.   ED Course  Procedures (including critical care time) Labs Review Labs Reviewed  URINALYSIS, ROUTINE W REFLEX MICROSCOPIC - Abnormal; Notable for the following:    Glucose, UA 500 (*)    Hgb urine dipstick TRACE (*)    Leukocytes, UA SMALL (*)    All other components within normal limits  PROTIME-INR - Abnormal; Notable for the following:    Prothrombin Time 27.8 (*)    INR 2.57 (*)    All other  components within normal limits  URINE CULTURE  URINE MICROSCOPIC-ADD ON  I-STAT CHEM 8, ED    Imaging Review Dg Wrist Complete Left  10/04/2014   CLINICAL DATA:  Pain following fall  EXAM: LEFT WRIST - COMPLETE 3+ VIEW  COMPARISON:  None.  FINDINGS: Frontal, oblique, lateral, and ulnar deviation scaphoid images were obtained. There is no fracture or dislocation. There is moderate osteoarthritic change in the saddle joint. There is slightly milder osteoarthritic change in the scaphotrapezial joint. There is also osteoarthritic change at the hamate -fifth metacarpal junction. No erosive change. Bones appear osteoporotic.  IMPRESSION: Areas of osteoarthritic change. Osteoporosis.  No acute fracture or dislocation.   Electronically Signed   By: Lowella Grip III M.D.   On: 10/04/2014 17:40   Dg Tibia/fibula Right  10/04/2014   CLINICAL DATA:  Pain following fall  EXAM: RIGHT TIBIA AND FIBULA - 2 VIEW  COMPARISON:  Right knee October 04, 2014  FINDINGS: Frontal and lateral views were obtained. There is osteochondritis dissecans along the distal femoral condyle medially. There is no acute fracture or dislocation. There are multiple foci of arterial vascular calcification. There is extensive osteoarthritic change in the knee joint.  IMPRESSION: No acute fracture or dislocation. Osteoarthritic change in the knee joint. Osteochondritis dissecans along the distal medial femur.   Electronically Signed   By: Lowella Grip III M.D.   On: 10/04/2014 17:41   Ct Head Wo Contrast  10/04/2014   CLINICAL DATA:  Fall today with trauma to back of head. No loss of consciousness.  EXAM: CT HEAD WITHOUT CONTRAST  TECHNIQUE: Contiguous axial images were obtained from the base of the skull through the vertex without intravenous contrast.  COMPARISON:  10/11/2013  FINDINGS: Sinuses/Soft tissues: No definite soft tissue swelling. No skull fracture. Clear paranasal sinuses and mastoid air cells.  Intracranial: Moderate low  density in the periventricular white matter likely related to small vessel disease. No mass lesion, hemorrhage, hydrocephalus, acute infarct, intra-axial, or extra-axial fluid collection.  IMPRESSION: 1.  No acute intracranial abnormality. 2. Moderate small vessel ischemic change.   Electronically Signed   By: Abigail Miyamoto M.D.   On: 10/04/2014 17:24   Dg Knee Complete 4 Views Right  10/04/2014   CLINICAL DATA:  Pain following fall  EXAM: RIGHT KNEE - COMPLETE 4+ VIEW  COMPARISON:  None.  FINDINGS: Frontal, lateral, and bilateral oblique views were obtained. There is osteochondritis dissecans along the distal medial femoral condyle. No acute fracture apparent. No dislocation. There is a joint effusion. There is marked narrowing medially. There is moderately severe narrowing of the patellofemoral joint. There is spurring medially and arising from the patella. There are multiple foci of arterial vascular calcification.  IMPRESSION: Osteoarthritic change, most marked medially and in the patellofemoral joint. Osteochondritis dissecans is noted along the distal femoral condyle. No acute fracture or dislocation. There is a joint effusion, fairly small.   Electronically Signed   By: Lowella Grip III M.D.   On: 10/04/2014 17:38     EKG Interpretation None      MDM   Final diagnoses:  Head injury  Fall  Right knee pain  Left wrist pain  Contusion  UTI (lower urinary tract infection)  Wrist sprain, left, initial encounter  Right knee sprain, initial encounter    81 y.o. female here with mechanical fall onto carpeted flooring. On coumadin. Hit head on R side, no LOC. Pain to R knee diffusely and R tib/fib. Pain to L wrist. All extremities neurovascularly intact with soft compartments. Will proceed with CT head, R knee and tib/fib xray, and L wrist xray. Will also obtain U/A since pt has symptoms of dysuria and frequency. Will get INR and chem 8. Pt declines anything for pain at this time. Will  reassess shortly.   6:03 PM U/A with small leuks, rare squamous, 3-6 WBC, rare bacteria but given her symptoms of UTI, will proceed with culture and treatment for UTI. INR therapeutic. Xrays all negative for acute changes. Will attempt to ambulate with walker.  6:41 PM Ambulatory with steady gait although limps on R side due to pain but  able to still get around with walker. Will get wrist splint and knee sleeve. Discussed RICE therapy and tylenol/motrin for pain, f/up with ortho in 1-2wks for ongoing pain. Will tx with keflex for her UTI, discussed calling her warfarin doctor in the morning to adjust schedule while taking abx. I explained the diagnosis and have given explicit precautions to return to the ER including for any other new or worsening symptoms. The patient understands and accepts the medical plan as it's been dictated and I have answered their questions. Discharge instructions concerning home care and prescriptions have been given. The patient is STABLE and is discharged to home in good condition.  BP 151/55 mmHg  Pulse 67  Temp(Src) 98.4 F (36.9 C) (Oral)  Resp 16  SpO2 98%  Meds ordered this encounter  Medications  . cephALEXin (KEFLEX) 500 MG capsule    Sig: Take 1 capsule (500 mg total) by mouth 2 (two) times daily. x 7 days    Dispense:  14 capsule    Refill:  0    Order Specific Question:  Supervising Provider    Answer:  Noemi Chapel [[id number]]     Abhinav Mayorquin Camprubi-Soms, PA-C 10/04/14 1843  Carmin Muskrat, MD 10/05/14 [phone]

## 2014-10-04 NOTE — ED Notes (Signed)
Pt transported from [facility] via EMS after witnessed fall while walking from lunch, pt turned to get walker, tripping and fell onto R side onto carpeted floor. Pt c/o R knee pain, R wrist pain.

## 2014-10-04 NOTE — Progress Notes (Signed)
CSW met with pt at bedside. Husband was present. Husband confirms that the pt is from [place]. He says that the live together at the facility and have been staying there since April, 2014.   Husband confirms that the pt presents to [facility] due to falling. He stated " Her shoe caught in the carpet on the elevator and she was unable to regain her balance."  Husband states that the pt has fallen one time within the past 6 months. Patient states that she is able to complete her ADL's independently and that she and her husband live in the independent unit at the facility.   Husband informed CSW that he is the pt's primary support.   Willette Brace [phone] ED CSW 10/04/2014 5:40 PM

## 2014-10-04 NOTE — ED Notes (Signed)
Pt out of room for x-rays

## 2014-10-06 LAB — I-STAT CHEM 8, ED
BUN: 18 mg/dL (ref 6–23)
Calcium, Ion: 1.17 mmol/L (ref 1.13–1.30)
Chloride: 103 mmol/L (ref 96–112)
Creatinine, Ser: 1 mg/dL (ref 0.50–1.10)
Glucose, Bld: 275 mg/dL — ABNORMAL HIGH (ref 70–99)
HEMATOCRIT: 35 % — AB (ref 36.0–46.0)
HEMOGLOBIN: 11.9 g/dL — AB (ref 12.0–15.0)
POTASSIUM: 4.1 mmol/L (ref 3.5–5.1)
SODIUM: 141 mmol/L (ref 135–145)
TCO2: 21 mmol/L (ref 0–100)

## 2014-10-07 LAB — URINE CULTURE
Colony Count: 50000
Special Requests: NORMAL

## 2014-10-08 ENCOUNTER — Telehealth: Payer: Self-pay | Admitting: Emergency Medicine

## 2014-10-08 NOTE — Telephone Encounter (Signed)
Post ED Visit - Positive Culture Follow-up  Culture report reviewed by antimicrobial stewardship pharmacist: []  Wes Dulaney, Pharm.D., BCPS []  Heide Guile, Pharm.D., BCPS []  Alycia Rossetti, Pharm.D., BCPS [x]  [place], [place].D., BCPS, AAHIVP []  Legrand Como, Pharm.D., BCPS, AAHIVP []  Isac Sarna, Pharm.D., BCPS  Positive Urine culture Treated with Cephalexin, organism sensitive to the same and no further patient follow-up is required at this time.  Ernesta Amble 10/08/2014, 4:54 PM

## 2014-10-11 ENCOUNTER — Other Ambulatory Visit: Payer: Self-pay

## 2014-10-11 MED ORDER — METOPROLOL TARTRATE 25 MG PO TABS: 25.0000 mg | ORAL_TABLET | Freq: Two times a day (BID) | ORAL | Status: DC

## 2014-10-12 ENCOUNTER — Encounter: Payer: Self-pay | Admitting: Cardiology

## 2014-10-24 ENCOUNTER — Encounter: Payer: Self-pay | Admitting: Pharmacist

## 2014-10-24 ENCOUNTER — Ambulatory Visit (INDEPENDENT_AMBULATORY_CARE_PROVIDER_SITE_OTHER): Payer: Medicare Other | Admitting: Pharmacist

## 2014-10-24 VITALS — BP 148/66 | HR 60

## 2014-10-24 DIAGNOSIS — Z7901 Long term (current) use of anticoagulants: Secondary | ICD-10-CM | POA: Diagnosis not present

## 2014-10-24 DIAGNOSIS — I480 Paroxysmal atrial fibrillation: Secondary | ICD-10-CM

## 2014-10-24 DIAGNOSIS — I10 Essential (primary) hypertension: Secondary | ICD-10-CM

## 2014-10-24 LAB — POCT INR: INR: 3.3

## 2014-10-24 NOTE — Progress Notes (Signed)
Pharmacist Hypertension Clinic - Resident Research Study  S/O: Ana [name] is a 81 y.o. white female presenting today for 12wk follow-up for HTN study, referred to hypertension clinic by Dr. Radford Pax.  Pt has PMH significant for CAD (s/p NSTEMI 12/2013), chronic diastolic CHF, PAF, HTN, HLD, DM, mitral stenosis (not evident on recent ECHO), and pulmonary HTN (not evident on recent ECHO).   Pt lives in an assisted living community [facility]) and has limited choices related to diet, but reports never adding salt to food. See RN note from 07/25/14 regarding diet recommendations.  She has limited mobility and uses a walker.  Pt expresses concern related to the ability to obtain her medications due to cost.  She has Medicare, but does not have a Part D plan - must pay cash.  She fills w/ [facility] and utilizes their $4 list.  I suspect her more expensive BP medications are amlodipine and losartan (Lopressor is on $4 list).  It is unclear to me why she is on losartan instead of an ACEi, which would be cheaper and is included on $4 list.  There is no record of her in our EHR indicating that she was previous on an ACEi, no allergy to ACEi documented.  Current HTN meds: Amlodipine 5mg  daily Losartan 100mg  daily Metoprolol tartrate 25mg  BID  Pt and husband report strict adherence to all meds, uses a pill box to ensure taking all appropriately and on time.  FH: Diabetes in her brother; Heart attack in her sister; Heart disease in her sister; otherwise noncontributory for CV disease  SH: Pt does not smoke, denies alcohol use  Wt Readings from Last 3 Encounters:  08/03/14 161 lb (73.029 kg)  07/25/14 161 lb 6.4 oz (73.211 kg)  07/18/14 164 lb (74.39 kg)   BP Readings from Last 3 Encounters:  10/26/14 148/66  09/13/14 133/64  08/10/14 156/62   Pulse Readings from Last 3 Encounters:  10/24/14 60  10/04/14 67  08/10/14 61    Renal function: CrCl ~52 mL/min, lytes ok  Current Outpatient  Prescriptions on File Prior to Visit  Medication Sig Dispense Refill  . amLODipine (NORVASC) 5 MG tablet Take 1 tablet (5 mg total) by mouth daily. 180 tablet 3  . aspirin 81 MG tablet Take 81 mg by mouth at bedtime.     . cephALEXin (KEFLEX) 500 MG capsule Take 1 capsule (500 mg total) by mouth 2 (two) times daily. x 7 days 14 capsule 0  . Cholecalciferol (VITAMIN D3) 2000 UNITS TABS Take 2,000 Units by mouth at bedtime.     . Coenzyme Q10 (CO Q 10) 10 MG CAPS Take 10 mg by mouth at bedtime.     Marland Kitchen levothyroxine (SYNTHROID, LEVOTHROID) 88 MCG tablet Take 88 mcg by mouth daily before breakfast.    . losartan (COZAAR) 100 MG tablet Take 1 tablet (100 mg total) by mouth daily. 90 tablet 3  . metFORMIN (GLUCOPHAGE) 1000 MG tablet Take 1 tablet (1,000 mg total) by mouth 2 (two) times daily with a meal. 60 tablet 0  . metoprolol tartrate (LOPRESSOR) 25 MG tablet Take 1 tablet (25 mg total) by mouth 2 (two) times daily. 60 tablet 6  . Omega-3 Krill Oil 500 MG CAPS Take 1 capsule by mouth at bedtime.     Marland Kitchen warfarin (COUMADIN) 2.5 MG tablet Take as directed by coumadin clinic (Patient taking differently: Take 2.5 mg by mouth daily. Take as directed by coumadin clinic) 35 tablet 3   No  current facility-administered medications on file prior to visit.    Allergies: Allergies  Allergen Reactions  . Welchol [Colesevelam Hcl] Nausea Only  . Zetia [Ezetimibe] Other (See Comments)    Edema   . Crestor [Rosuvastatin] Other (See Comments)    Unknown  . Zocor [Simvastatin] Other (See Comments)    Muscle weakness    A/P: Pt has qualified for study enrollment and has signed informed consent. Pt was randomized to study group: Home BP and telephone F/U  Pt is at BP goal of < 140/85 mmHg (goal per Dr. Radford Pax), although slightly higher than goal in office today.  Her most recent office BP check was performed in the ED s/p fall, so likely elevated above normal at that time.  I have reviewed the pt's home BP  log and all have been at goal (120s-130s / 60s-70s).  Continue taking all meds as prescribed Pt to call our office if she continues to experience issues obtaining meds d/t cost >> at that time will contact PCP (Dr. London Pepper) for records Continue care with your primary care doctor and cardiologist  Diet and lifestyle recommendations will likely have limited role in this pt's BP d/t living situation and limited mobility.  Drucie Opitz, PharmD Clinical Pharmacy Resident

## 2014-10-24 NOTE — Patient Instructions (Signed)
It has been a pleasure working with you  Continue taking all your medicines as prescribed Call me and leave a message letting me know which blood pressure medicines are expensive - [phone] Let me know which medicines you would like switched to 3 month supplies Continue care with your primary care doctor and Dr. Radford Pax  HOW TO TAKE YOUR BLOOD PRESSURE: -Rest 5 minutes before taking your blood pressure -Do not drink caffeinated beverages for at least 30 minutes before -Take your blood pressure before (not after) you eat -Sit comfortably with your back supported and both feet on the floor (don't cross your legs) -Elevate your arm to heart level on a table or a desk -There should be enough room to slip a fingertip under the cuff. The bottom edge of the cuff should be 1 inch above the crease of the elbow -Ideally, take 3 measurements at one sitting and record the average

## 2014-10-26 ENCOUNTER — Ambulatory Visit: Payer: Medicare Other | Admitting: Pharmacist

## 2014-11-23 DIAGNOSIS — E785 Hyperlipidemia, unspecified: Secondary | ICD-10-CM | POA: Diagnosis not present

## 2014-11-23 DIAGNOSIS — I2510 Atherosclerotic heart disease of native coronary artery without angina pectoris: Secondary | ICD-10-CM | POA: Diagnosis not present

## 2014-11-23 DIAGNOSIS — N390 Urinary tract infection, site not specified: Secondary | ICD-10-CM | POA: Diagnosis not present

## 2014-11-23 DIAGNOSIS — I10 Essential (primary) hypertension: Secondary | ICD-10-CM | POA: Diagnosis not present

## 2014-11-23 DIAGNOSIS — I4891 Unspecified atrial fibrillation: Secondary | ICD-10-CM | POA: Diagnosis not present

## 2014-11-23 DIAGNOSIS — R296 Repeated falls: Secondary | ICD-10-CM | POA: Diagnosis not present

## 2014-11-23 DIAGNOSIS — E119 Type 2 diabetes mellitus without complications: Secondary | ICD-10-CM | POA: Diagnosis not present

## 2014-11-23 DIAGNOSIS — E039 Hypothyroidism, unspecified: Secondary | ICD-10-CM | POA: Diagnosis not present

## 2014-11-23 DIAGNOSIS — R300 Dysuria: Secondary | ICD-10-CM | POA: Diagnosis not present

## 2014-11-28 ENCOUNTER — Ambulatory Visit (INDEPENDENT_AMBULATORY_CARE_PROVIDER_SITE_OTHER): Payer: Medicare Other | Admitting: *Deleted

## 2014-11-28 DIAGNOSIS — Z7901 Long term (current) use of anticoagulants: Secondary | ICD-10-CM | POA: Diagnosis not present

## 2014-11-28 DIAGNOSIS — I480 Paroxysmal atrial fibrillation: Secondary | ICD-10-CM | POA: Diagnosis not present

## 2014-11-28 LAB — POCT INR: INR: 3.2

## 2014-11-28 MED ORDER — WARFARIN SODIUM 2.5 MG PO TABS: ORAL_TABLET | ORAL | Status: DC

## 2015-01-02 ENCOUNTER — Ambulatory Visit (INDEPENDENT_AMBULATORY_CARE_PROVIDER_SITE_OTHER): Payer: Medicare Other

## 2015-01-02 DIAGNOSIS — I480 Paroxysmal atrial fibrillation: Secondary | ICD-10-CM

## 2015-01-02 DIAGNOSIS — Z7901 Long term (current) use of anticoagulants: Secondary | ICD-10-CM | POA: Diagnosis not present

## 2015-01-02 LAB — POCT INR: INR: 3.5

## 2015-01-09 ENCOUNTER — Other Ambulatory Visit: Payer: Self-pay | Admitting: Cardiology

## 2015-01-10 ENCOUNTER — Other Ambulatory Visit: Payer: Self-pay

## 2015-01-10 MED ORDER — LOSARTAN POTASSIUM 100 MG PO TABS: 100.0000 mg | ORAL_TABLET | Freq: Every day | ORAL | Status: DC

## 2015-01-16 ENCOUNTER — Ambulatory Visit: Payer: Medicare Other | Admitting: Cardiology

## 2015-01-23 ENCOUNTER — Ambulatory Visit (INDEPENDENT_AMBULATORY_CARE_PROVIDER_SITE_OTHER): Payer: Medicare Other | Admitting: Pharmacist

## 2015-01-23 DIAGNOSIS — Z7901 Long term (current) use of anticoagulants: Secondary | ICD-10-CM | POA: Diagnosis not present

## 2015-01-23 DIAGNOSIS — I480 Paroxysmal atrial fibrillation: Secondary | ICD-10-CM

## 2015-01-23 LAB — POCT INR: INR: 3.6

## 2015-02-06 ENCOUNTER — Ambulatory Visit (INDEPENDENT_AMBULATORY_CARE_PROVIDER_SITE_OTHER): Payer: Medicare Other | Admitting: *Deleted

## 2015-02-06 DIAGNOSIS — Z7901 Long term (current) use of anticoagulants: Secondary | ICD-10-CM

## 2015-02-06 DIAGNOSIS — I480 Paroxysmal atrial fibrillation: Secondary | ICD-10-CM | POA: Diagnosis not present

## 2015-02-06 LAB — POCT INR: INR: 3

## 2015-02-12 NOTE — Progress Notes (Signed)
Cardiology Office Note   Date:  02/13/2015   ID:  SHAVELLE RUNKEL, DOB [birth year], MRN [id number]  PCP:  Pcp Not In System    Chief Complaint  Patient presents with  . PAF      History of Present Illness: Ana [name] is a 81 y.o. female with a hx of PAFib, diastolic CHF, pulmonary HTN, mild mitral stenosis (echo 09/2012), HTN, HL, anemia of chronic disease. She has been kept on coumadin instead of NOAC due to valvular heart dsz. She was previously followed by the [facility] and est with me 10/2013. Admitted 12/2013 with NSTEMI c/b acute diastolic CHF. She was gently diuresed. Troponin peaked at 4.89. She had recurrent episodes of PAFib but converted to NSR. LHC demonstrated multivessel CAD. She underwent PCI with BMS to the LAD and CFX and subsequent staged PCI with BMS to the RCA. EF was preserved at 50%.She has continued on ASA/warfarin. She returns for FU.She walks with a walker. She denies chest pain, shortness of breath, syncope, orthopnea, PND or significant pedal edema.     Past Medical History  Diagnosis Date  . Diabetes mellitus without complication   . PAF (paroxysmal atrial fibrillation)   . Hypertension   . Mitral stenosis echo 2015    resolved by echo 2015  . Chronic diastolic CHF (congestive heart failure)   . Pulmonary HTN     no evidence on last echo 2015  . Hypothyroidism   . Anemia of chronic disease   . Hyperlipidemia   . Vitamin D deficiency disease   . MI, old 07/18/2014    Past Surgical History  Procedure Laterality Date  . Cataract extraction    . Left heart catheterization with coronary angiogram N/A 01/17/2014    Procedure: LEFT HEART CATHETERIZATION WITH CORONARY ANGIOGRAM;  Surgeon: Troy Sine, MD;  Location: [facility] CATH LAB;  Service: Cardiovascular;  Laterality: N/A;  . Percutaneous coronary stent intervention (pci-s)  01/17/2014    Procedure: PERCUTANEOUS CORONARY STENT INTERVENTION (PCI-S);  Surgeon: Troy Sine,  MD;  Location: [facility] CATH LAB;  Service: Cardiovascular;;  . Percutaneous coronary stent intervention (pci-s) N/A 01/19/2014    Procedure: PERCUTANEOUS CORONARY STENT INTERVENTION (PCI-S);  Surgeon: Leonie Man, MD;  Location: [facility] CATH LAB;  Service: Cardiovascular;  Laterality: N/A;     Current Outpatient Prescriptions  Medication Sig Dispense Refill  . amLODipine (NORVASC) 5 MG tablet Take 1 tablet (5 mg total) by mouth daily. 180 tablet 3  . aspirin 81 MG tablet Take 81 mg by mouth at bedtime.     . Cholecalciferol (VITAMIN D3) 2000 UNITS TABS Take 2,000 Units by mouth at bedtime.     . Coenzyme Q10 (CO Q 10) 10 MG CAPS Take 10 mg by mouth at bedtime.     Marland Kitchen levothyroxine (SYNTHROID, LEVOTHROID) 88 MCG tablet Take 88 mcg by mouth daily before breakfast.    . losartan (COZAAR) 100 MG tablet Take 1 tablet (100 mg total) by mouth daily. 90 tablet 3  . metFORMIN (GLUCOPHAGE) 1000 MG tablet Take 1 tablet (1,000 mg total) by mouth 2 (two) times daily with a meal. 60 tablet 0  . metoprolol tartrate (LOPRESSOR) 25 MG tablet Take 1 tablet (25 mg total) by mouth 2 (two) times daily. 60 tablet 6  . Omega-3 Krill Oil 500 MG CAPS Take 1 capsule by mouth at bedtime.     Marland Kitchen  warfarin (COUMADIN) 2.5 MG tablet Take as directed by coumadin clinic 35 tablet 3   No current facility-administered medications for this visit.    Allergies:   Welchol; Zetia; Crestor; and Zocor    Social History:  The patient  reports that she has never smoked. She does not have any smokeless tobacco history on file. She reports that she does not drink alcohol or use illicit drugs.   Family History:  The patient's family history includes Cancer in her mother; Cirrhosis in her father; Diabetes in her brother; Heart attack in her sister; Heart disease in her sister.    ROS:  Please see the history of present illness.   Otherwise, review of systems are positive for none.   All other systems are reviewed and negative.    PHYSICAL  EXAM: VS:  BP 140/68 mmHg  Pulse 65  Ht 5\' 2"  (1.575 m)  Wt 162 lb 12.8 oz (73.846 kg)  BMI 29.77 kg/m2  SpO2 98% , BMI Body mass index is 29.77 kg/(m^2). GEN: Well nourished, well developed, in no acute distress HEENT: normal Neck: no JVD, carotid bruits, or masses Cardiac: RRR; no murmurs, rubs, or gallops.  Trace edema  Respiratory:  clear to auscultation bilaterally, normal work of breathing GI: soft, nontender, nondistended, + BS MS: no deformity or atrophy Skin: warm and dry, no rash Neuro:  Strength and sensation are intact Psych: euthymic mood, full affect   EKG:  EKG was ordered today and showed NSR with first degree AV block and nonspecific ST abnormality    Recent Labs: 10/04/2014: BUN 18; Creatinine, Ser 1.00; Hemoglobin 11.9*; Potassium 4.1; Sodium 141    Lipid Panel    Component Value Date/Time   CHOL 157 01/15/2014 0225   TRIG 95 01/15/2014 0225   HDL 47 01/15/2014 0225   CHOLHDL 3.3 01/15/2014 0225   VLDL 19 01/15/2014 0225   LDLCALC 91 01/15/2014 0225      Wt Readings from Last 3 Encounters:  02/13/15 162 lb 12.8 oz (73.846 kg)  08/03/14 161 lb (73.029 kg)  07/25/14 161 lb 6.4 oz (73.211 kg)    ASSESSMENT AND PLAN:  1. CAD s/p NSTEMI 12/2013: She is doing well after recent non-STEMI treated with multivessel PCI. Continue beta blocker/ASA. She is intolerant of statins. 2. Chronic diastolic CHF (congestive heart failure): Volume stable. 3. PAF (paroxysmal atrial fibrillation): She is maintaining NSR. Continue current dose of beta blocker and Coumadin. 4. Essential hypertension:  Continue Losartan and BB.  Check BMET. 5. HLD (hyperlipidemia): She is intolerant of statins. Last LDL was 90 and her goal is <70. I will recheck FLP and ALT  6. Mitral stenosis: This was not evident on recent echocardiogram. 7. Pulmonary HTN: This was not evident on recent echocardiogram.    Current medicines are reviewed at length with the patient today.  The patient  does not have concerns regarding medicines.  The following changes have been made:  no change  Labs/ tests ordered today: See above Assessment and Plan No orders of the defined types were placed in this encounter.     Disposition:   FU with me in 6 months  Signed, Sueanne Margarita, MD  02/13/2015 11:29 AM    [facility] [address], [place], [place]  [address] Phone: [phone]; Fax: [phone]

## 2015-02-13 ENCOUNTER — Encounter: Payer: Self-pay | Admitting: Cardiology

## 2015-02-13 ENCOUNTER — Ambulatory Visit (INDEPENDENT_AMBULATORY_CARE_PROVIDER_SITE_OTHER): Payer: Medicare Other | Admitting: Cardiology

## 2015-02-13 VITALS — BP 140/68 | HR 65 | Ht 62.0 in | Wt 162.8 lb

## 2015-02-13 DIAGNOSIS — E785 Hyperlipidemia, unspecified: Secondary | ICD-10-CM

## 2015-02-13 DIAGNOSIS — R42 Dizziness and giddiness: Secondary | ICD-10-CM

## 2015-02-13 DIAGNOSIS — I2510 Atherosclerotic heart disease of native coronary artery without angina pectoris: Secondary | ICD-10-CM

## 2015-02-13 DIAGNOSIS — I050 Rheumatic mitral stenosis: Secondary | ICD-10-CM

## 2015-02-13 DIAGNOSIS — I480 Paroxysmal atrial fibrillation: Secondary | ICD-10-CM | POA: Diagnosis not present

## 2015-02-13 DIAGNOSIS — I10 Essential (primary) hypertension: Secondary | ICD-10-CM | POA: Diagnosis not present

## 2015-02-13 DIAGNOSIS — I272 Pulmonary hypertension, unspecified: Secondary | ICD-10-CM

## 2015-02-13 DIAGNOSIS — I270 Primary pulmonary hypertension: Secondary | ICD-10-CM

## 2015-02-13 DIAGNOSIS — I5032 Chronic diastolic (congestive) heart failure: Secondary | ICD-10-CM | POA: Diagnosis not present

## 2015-02-13 HISTORY — DX: Dizziness and giddiness: R42

## 2015-02-13 NOTE — Patient Instructions (Signed)
Medication Instructions:  Your physician recommends that you continue on your current medications as directed. Please refer to the Current Medication list given to you today.   Labwork: BMET, LFTs, Lipids on Tuesday, August 30. You may come ANYTIME between 7:30 AM and 5:00 PM.  Testing/Procedures: None  Follow-Up: Your physician wants you to follow-up in: 6 months with Dr. Radford Pax. You will receive a reminder letter in the mail two months in advance. If you don't receive a letter, please call our office to schedule the follow-up appointment.   Any Other Special Instructions Will Be Listed Below (If Applicable).

## 2015-02-20 ENCOUNTER — Other Ambulatory Visit: Payer: Medicare Other

## 2015-02-27 ENCOUNTER — Ambulatory Visit (INDEPENDENT_AMBULATORY_CARE_PROVIDER_SITE_OTHER): Payer: Medicare Other | Admitting: *Deleted

## 2015-02-27 DIAGNOSIS — Z7901 Long term (current) use of anticoagulants: Secondary | ICD-10-CM | POA: Diagnosis not present

## 2015-02-27 DIAGNOSIS — I480 Paroxysmal atrial fibrillation: Secondary | ICD-10-CM | POA: Diagnosis not present

## 2015-02-27 LAB — POCT INR: INR: 2.5

## 2015-04-03 ENCOUNTER — Ambulatory Visit (INDEPENDENT_AMBULATORY_CARE_PROVIDER_SITE_OTHER): Payer: Medicare Other | Admitting: *Deleted

## 2015-04-03 DIAGNOSIS — I480 Paroxysmal atrial fibrillation: Secondary | ICD-10-CM

## 2015-04-03 DIAGNOSIS — Z7901 Long term (current) use of anticoagulants: Secondary | ICD-10-CM | POA: Diagnosis not present

## 2015-04-03 LAB — POCT INR: INR: 2.8

## 2015-04-23 ENCOUNTER — Other Ambulatory Visit: Payer: Self-pay | Admitting: Cardiology

## 2015-05-07 IMAGING — CR DG KNEE COMPLETE 4+V*L*
4 series · 4 of 4 positions shown · non-contrast
Comparison: DG HIP COMPLETE*L* dated 10/11/2013

CLINICAL DATA: Left leg pain after a fall.

EXAM:
LEFT KNEE - COMPLETE 4+ VIEW

[t knee obl left (1 of 2)]
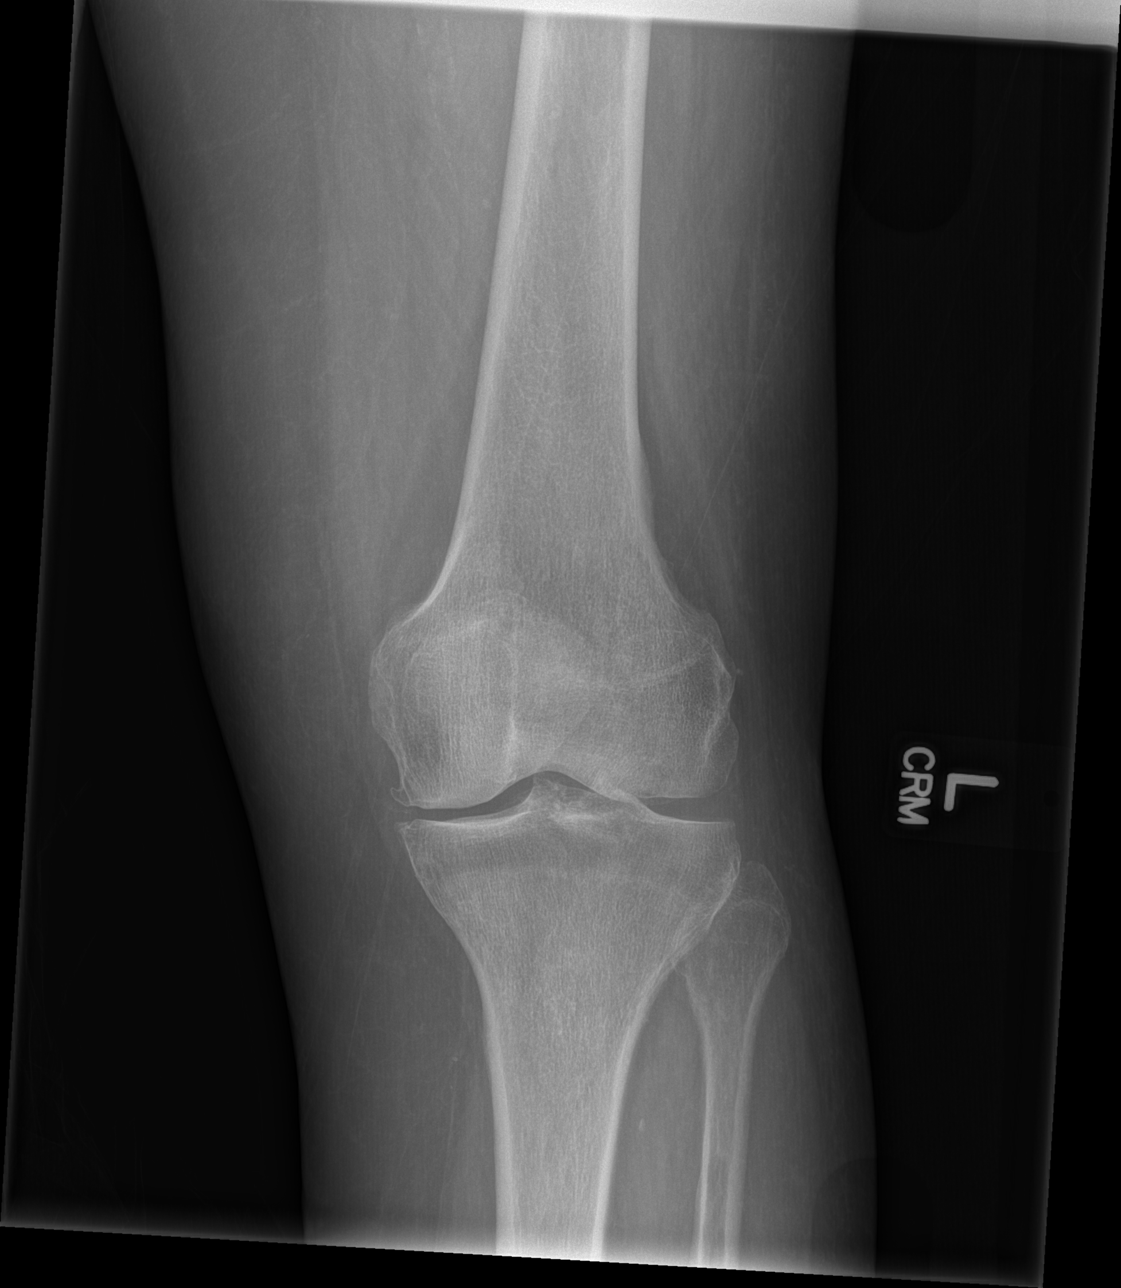

[t knee ap left]
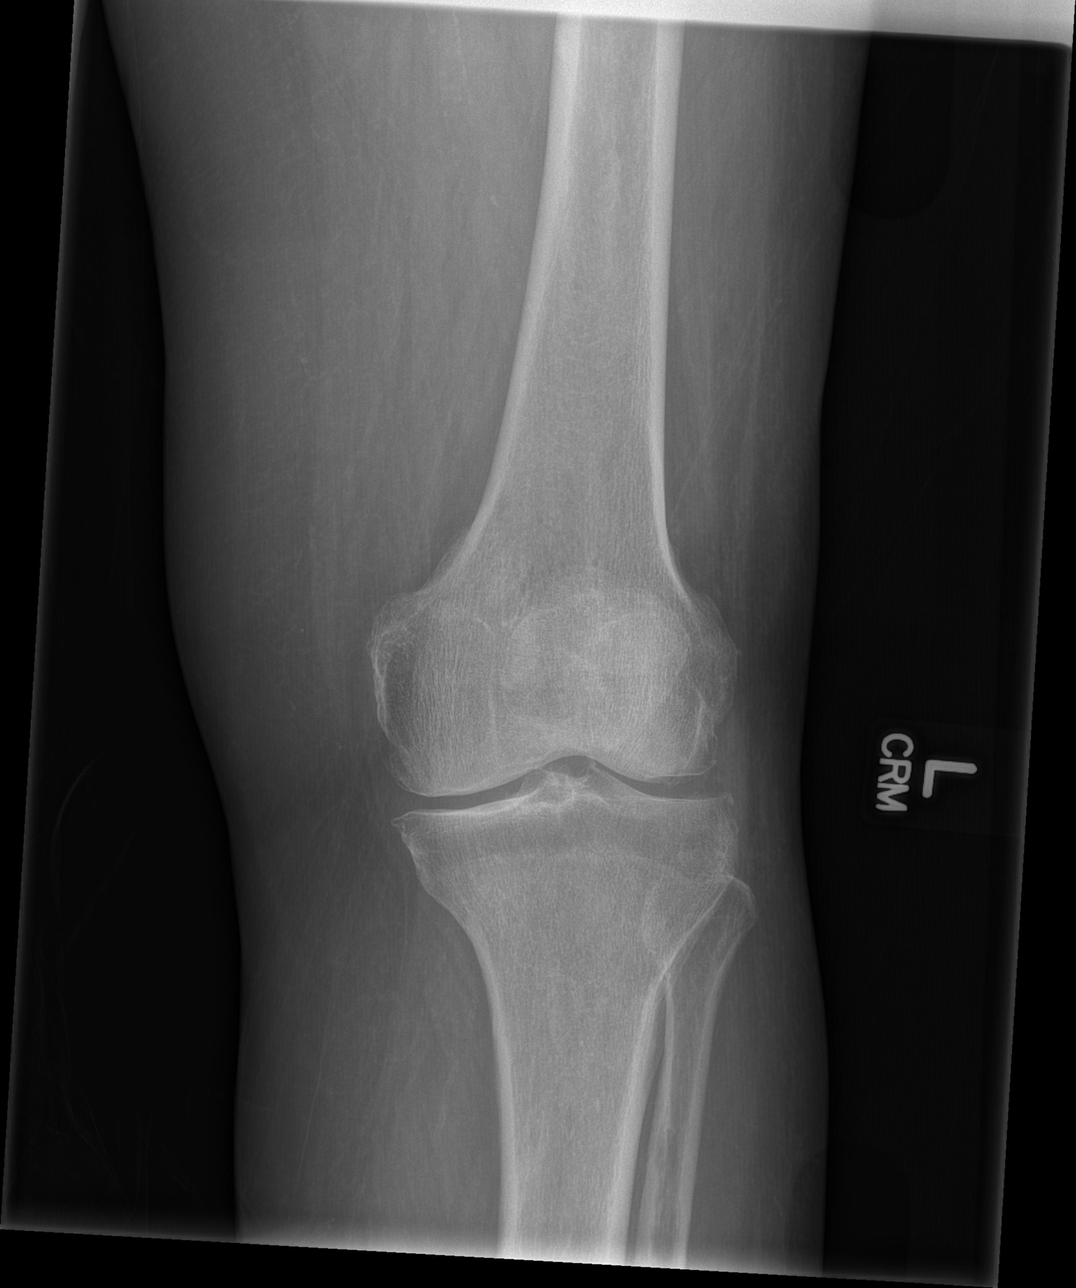

[t knee obl left (2 of 2)]
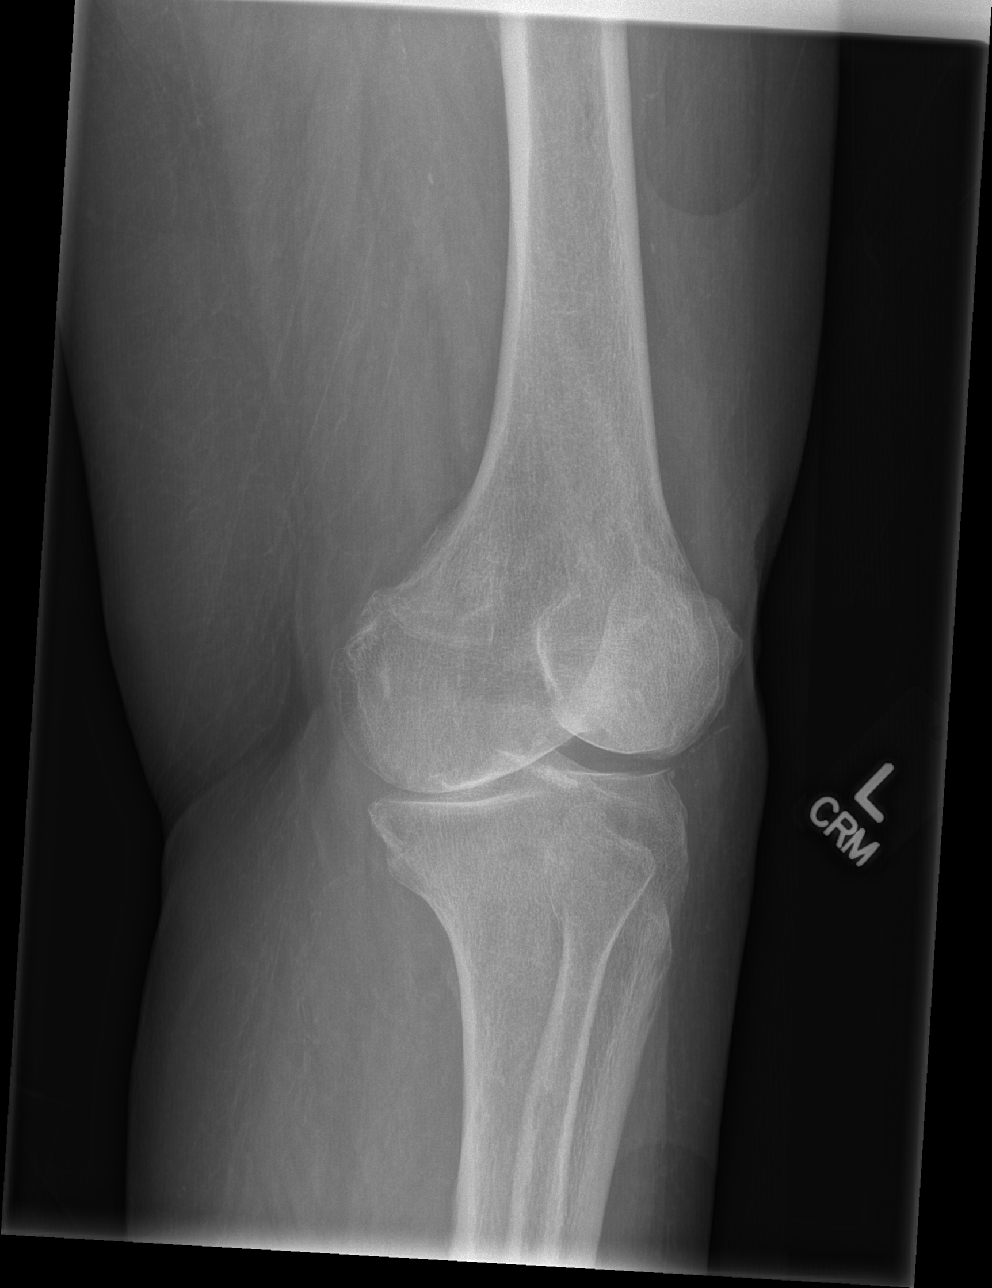

[t knee lat left]
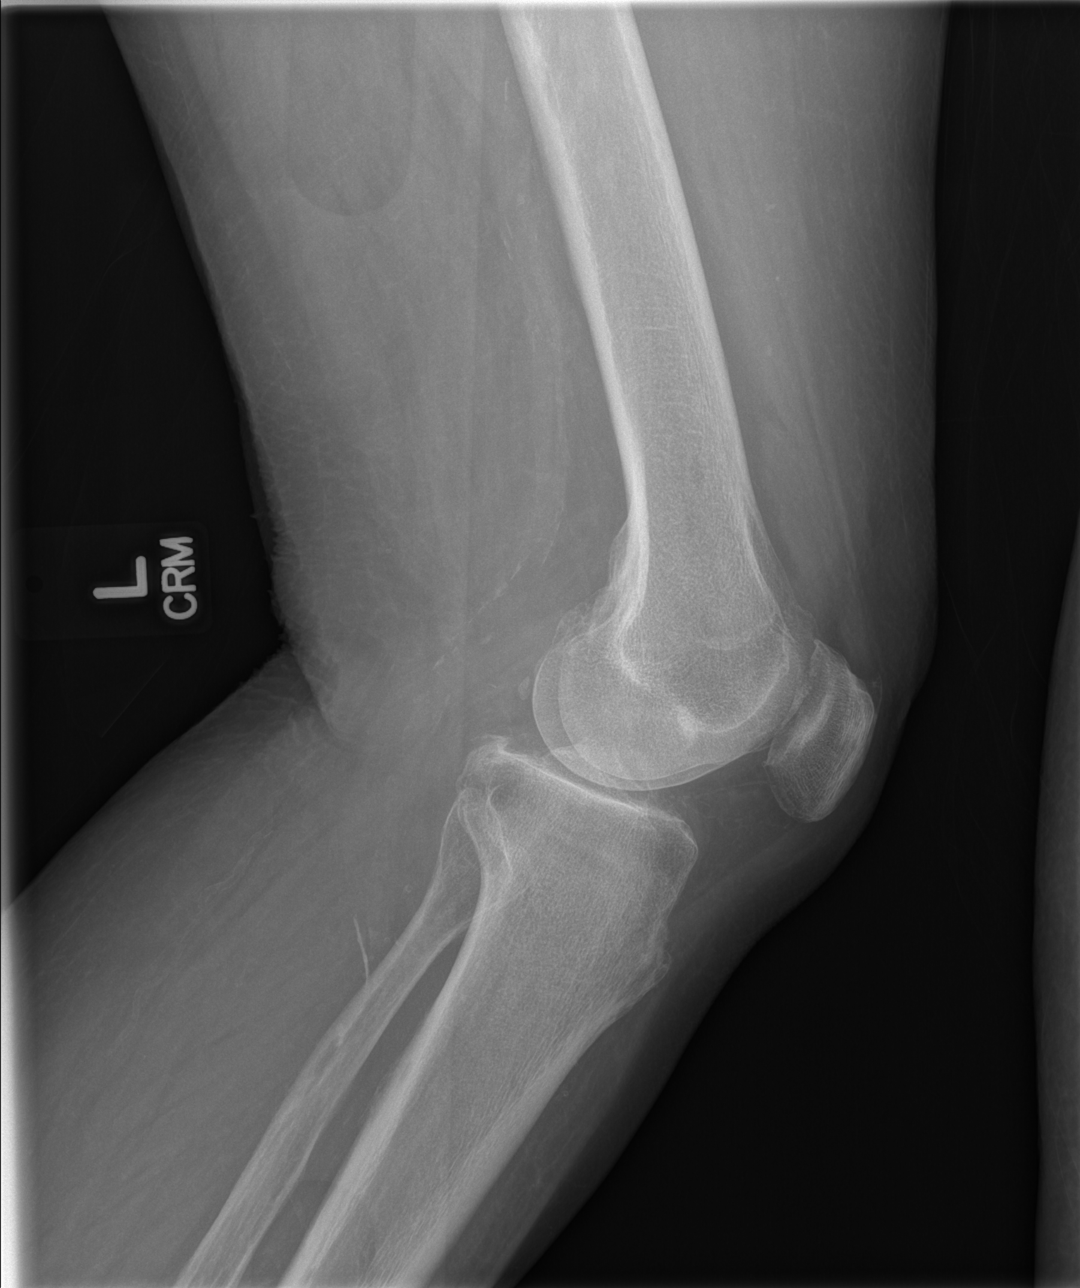

[4 of 4 positions shown; findings below may reference images not displayed]

FINDINGS: Degenerative changes with medial compartment narrowing and mild
hypertrophic changes. Small effusion. No evidence of acute fracture
or dislocation. Vascular calcifications.
IMPRESSION: No acute bony abnormalities. Small effusion and degenerative changes
present.

## 2015-05-09 ENCOUNTER — Other Ambulatory Visit: Payer: Self-pay | Admitting: Cardiology

## 2015-05-22 ENCOUNTER — Ambulatory Visit (INDEPENDENT_AMBULATORY_CARE_PROVIDER_SITE_OTHER): Payer: Medicare Other

## 2015-05-22 DIAGNOSIS — I480 Paroxysmal atrial fibrillation: Secondary | ICD-10-CM | POA: Diagnosis not present

## 2015-05-22 DIAGNOSIS — Z7901 Long term (current) use of anticoagulants: Secondary | ICD-10-CM

## 2015-05-22 LAB — POCT INR: INR: 4.3

## 2015-05-24 ENCOUNTER — Encounter: Payer: Self-pay | Admitting: Cardiology

## 2015-06-08 ENCOUNTER — Ambulatory Visit (INDEPENDENT_AMBULATORY_CARE_PROVIDER_SITE_OTHER): Payer: Medicare Other | Admitting: *Deleted

## 2015-06-08 DIAGNOSIS — Z7901 Long term (current) use of anticoagulants: Secondary | ICD-10-CM | POA: Diagnosis not present

## 2015-06-08 DIAGNOSIS — I480 Paroxysmal atrial fibrillation: Secondary | ICD-10-CM

## 2015-06-08 LAB — POCT INR: INR: 5.2

## 2015-06-27 ENCOUNTER — Ambulatory Visit (INDEPENDENT_AMBULATORY_CARE_PROVIDER_SITE_OTHER): Payer: Medicare Other | Admitting: Pharmacist

## 2015-06-27 DIAGNOSIS — Z7901 Long term (current) use of anticoagulants: Secondary | ICD-10-CM | POA: Diagnosis not present

## 2015-06-27 DIAGNOSIS — I480 Paroxysmal atrial fibrillation: Secondary | ICD-10-CM

## 2015-06-27 LAB — POCT INR: INR: 4.3

## 2015-06-29 DIAGNOSIS — S4351XA Sprain of right acromioclavicular joint, initial encounter: Secondary | ICD-10-CM | POA: Diagnosis not present

## 2015-06-29 DIAGNOSIS — M25511 Pain in right shoulder: Secondary | ICD-10-CM | POA: Diagnosis not present

## 2015-07-10 ENCOUNTER — Ambulatory Visit (INDEPENDENT_AMBULATORY_CARE_PROVIDER_SITE_OTHER): Payer: Medicare Other

## 2015-07-10 DIAGNOSIS — I480 Paroxysmal atrial fibrillation: Secondary | ICD-10-CM | POA: Diagnosis not present

## 2015-07-10 DIAGNOSIS — Z7901 Long term (current) use of anticoagulants: Secondary | ICD-10-CM | POA: Diagnosis not present

## 2015-07-10 LAB — POCT INR: INR: 2.9

## 2015-08-21 DIAGNOSIS — I4891 Unspecified atrial fibrillation: Secondary | ICD-10-CM | POA: Diagnosis not present

## 2015-08-21 DIAGNOSIS — E039 Hypothyroidism, unspecified: Secondary | ICD-10-CM | POA: Diagnosis not present

## 2015-08-21 DIAGNOSIS — R42 Dizziness and giddiness: Secondary | ICD-10-CM | POA: Diagnosis not present

## 2015-08-21 DIAGNOSIS — Z0000 Encounter for general adult medical examination without abnormal findings: Secondary | ICD-10-CM | POA: Diagnosis not present

## 2015-08-21 DIAGNOSIS — E785 Hyperlipidemia, unspecified: Secondary | ICD-10-CM | POA: Diagnosis not present

## 2015-08-21 DIAGNOSIS — I2510 Atherosclerotic heart disease of native coronary artery without angina pectoris: Secondary | ICD-10-CM | POA: Diagnosis not present

## 2015-08-21 DIAGNOSIS — I10 Essential (primary) hypertension: Secondary | ICD-10-CM | POA: Diagnosis not present

## 2015-08-21 DIAGNOSIS — D649 Anemia, unspecified: Secondary | ICD-10-CM | POA: Diagnosis not present

## 2015-08-21 DIAGNOSIS — E119 Type 2 diabetes mellitus without complications: Secondary | ICD-10-CM | POA: Diagnosis not present

## 2015-08-21 DIAGNOSIS — R300 Dysuria: Secondary | ICD-10-CM | POA: Diagnosis not present

## 2015-08-21 DIAGNOSIS — Z23 Encounter for immunization: Secondary | ICD-10-CM | POA: Diagnosis not present

## 2015-09-18 NOTE — Progress Notes (Addendum)
Cardiology Office Note   Date:  09/19/2015   ID:  Ana [name], DOB November 23, 1931, MRN [id number]  PCP:  London Pepper, MD    Chief Complaint  Patient presents with  . Coronary Artery Disease  . Atrial Fibrillation  . Congestive Heart Failure      History of Present Illness: Ana [name] is a 82 y.o. female with a hx of PAFib, diastolic CHF, pulmonary HTN, mild mitral stenosis (echo 09/2012), HTN, HL, anemia of chronic disease. She has been kept on coumadin instead of NOAC due to valvular heart dsz. She also has a history of ASCAD with LHC demonstrating multivessel CAD and underwent PCI with BMS to the LAD and CFX and subsequent staged PCI with BMS to the RCA. EF was preserved at 50%.   She returns for FU.She walks with a walker. She denies any chest pain, shortness of breath, PND, orthopnea, dizziness, palpitations, syncope or significant pedal edema.      Past Medical History  Diagnosis Date  . Diabetes mellitus without complication ([place])   . PAF (paroxysmal atrial fibrillation) ([place])   . Hypertension   . Mitral stenosis echo 2015    resolved by echo 2015  . Chronic diastolic CHF (congestive heart failure) ([place])   . Pulmonary HTN ([place])     no evidence on last echo 2015  . Hypothyroidism   . Anemia of chronic disease   . Hyperlipidemia   . Vitamin D deficiency disease   . MI, old 07/18/2014  . Dizziness 02/13/2015    Past Surgical History  Procedure Laterality Date  . Cataract extraction    . Left heart catheterization with coronary angiogram N/A 01/17/2014    Procedure: LEFT HEART CATHETERIZATION WITH CORONARY ANGIOGRAM;  Surgeon: Troy Sine, MD;  Location: [facility] CATH LAB;  Service: Cardiovascular;  Laterality: N/A;  . Percutaneous coronary stent intervention (pci-s)  01/17/2014    Procedure: PERCUTANEOUS CORONARY STENT INTERVENTION (PCI-S);  Surgeon: Troy Sine, MD;  Location: [facility] CATH LAB;  Service: Cardiovascular;;  . Percutaneous coronary  stent intervention (pci-s) N/A 01/19/2014    Procedure: PERCUTANEOUS CORONARY STENT INTERVENTION (PCI-S);  Surgeon: Leonie Man, MD;  Location: [facility] CATH LAB;  Service: Cardiovascular;  Laterality: N/A;     Current Outpatient Prescriptions  Medication Sig Dispense Refill  . amLODipine (NORVASC) 5 MG tablet Take 1 tablet (5 mg total) by mouth daily. 180 tablet 3  . aspirin 81 MG tablet Take 81 mg by mouth at bedtime.     . Cholecalciferol (VITAMIN D3) 2000 UNITS TABS Take 2,000 Units by mouth at bedtime.     . Coenzyme Q10 (CO Q 10) 10 MG CAPS Take 10 mg by mouth at bedtime.     Marland Kitchen levothyroxine (SYNTHROID, LEVOTHROID) 88 MCG tablet Take 88 mcg by mouth daily before breakfast.    . losartan (COZAAR) 100 MG tablet Take 1 tablet (100 mg total) by mouth daily. 90 tablet 3  . metFORMIN (GLUCOPHAGE) 1000 MG tablet Take 1 tablet (1,000 mg total) by mouth 2 (two) times daily with a meal. 60 tablet 0  . metoprolol tartrate (LOPRESSOR) 25 MG tablet TAKE ONE TABLET BY MOUTH TWICE DAILY 60 tablet 3  . Omega-3 Krill Oil 500 MG CAPS Take 1 capsule by mouth at bedtime.     Marland Kitchen warfarin (COUMADIN) 2.5 MG tablet TAKE AS DIRECTED BY COUMADIN CLINIC 35 tablet 3  No current facility-administered medications for this visit.    Allergies:   Welchol; Zetia; Crestor; and Zocor    Social History:  The patient  reports that she has never smoked. She does not have any smokeless tobacco history on file. She reports that she does not drink alcohol or use illicit drugs.   Family History:  The patient's family history includes Cancer in her mother; Cirrhosis in her father; Diabetes in her brother; Heart attack in her sister; Heart disease in her sister.    ROS:  Please see the history of present illness.   Otherwise, review of systems are positive for none.   All other systems are reviewed and negative.    PHYSICAL EXAM: VS:  BP 112/48 mmHg  Pulse 56  Ht 5\' 2"  (1.575 m)  Wt 137 lb (62.143 kg)  BMI 25.05 kg/m2 ,  BMI Body mass index is 25.05 kg/(m^2). GEN: Well nourished, well developed, in no acute distress HEENT: normal Neck: no JVD, carotid bruits, or masses Cardiac: RRR; no murmurs, rubs, or gallops,no edema  Respiratory:  clear to auscultation bilaterally, normal work of breathing GI: soft, nontender, nondistended, + BS MS: no deformity or atrophy Skin: warm and dry, no rash Neuro:  Strength and sensation are intact Psych: euthymic mood, full affect   EKG:  EKG was ordered today and shows atrial flutter with SVR  with 5:1 block and inferior infarct.  HR 54bpm.      Recent Labs: 10/04/2014: BUN 18; Creatinine, Ser 1.00; Hemoglobin 11.9*; Potassium 4.1; Sodium 141    Lipid Panel    Component Value Date/Time   CHOL 157 01/15/2014 0225   TRIG 95 01/15/2014 0225   HDL 47 01/15/2014 0225   CHOLHDL 3.3 01/15/2014 0225   VLDL 19 01/15/2014 0225   LDLCALC 91 01/15/2014 0225      Wt Readings from Last 3 Encounters:  09/19/15 137 lb (62.143 kg)  02/13/15 162 lb 12.8 oz (73.846 kg)  08/03/14 161 lb (73.029 kg)     ASSESSMENT AND PLAN:  1. CAD s/p NSTEMI 12/2013 treated with multivessel PCI. Continue beta blocker/ASA. She is intolerant of statins. 2. Chronic diastolic CHF (congestive heart failure): She appears euvolemic on exam.  Continue ARB/BB.  Currently not on diuretic.   3. PAF (paroxysmal atrial fibrillation): She is now in atrial flutter with SVR and asymptomatic. Continue current dose of beta blocker and Coumadin.  Discussed options including rhythm vs. Rate control.  She does not want to pursue cardioversion so will continue with rate control.  She is bradycardic today and has felt tired today so I will decrease metoprolol to 25mg  1/2 tab BID.   4. Essential hypertension: Continue Losartan and BB. Check BMET. 5. HLD (hyperlipidemia): She is intolerant of statins. Last LDL was 91 and her goal is <70. I will recheck FLP and ALT 6. Mitral stenosis: This was not evident on  recent echocardiogram. 7. Pulmonary HTN: This was not evident on recent echocardiogram.    Current medicines are reviewed at length with the patient today.  The patient does not have concerns regarding medicines.  The following changes have been made:  Stop metoprolol  Labs/ tests ordered today: See above Assessment and Plan No orders of the defined types were placed in this encounter.     Disposition:   FU with me in 6 months  Signed, Sueanne Margarita, MD  09/19/2015 1:48 PM    [facility] [place], [place]  [address] Phone: [phone]; Fax: [phone]

## 2015-09-19 ENCOUNTER — Ambulatory Visit (INDEPENDENT_AMBULATORY_CARE_PROVIDER_SITE_OTHER): Payer: Medicare Other | Admitting: *Deleted

## 2015-09-19 ENCOUNTER — Encounter: Payer: Self-pay | Admitting: Cardiology

## 2015-09-19 ENCOUNTER — Ambulatory Visit (INDEPENDENT_AMBULATORY_CARE_PROVIDER_SITE_OTHER): Payer: Medicare Other | Admitting: Cardiology

## 2015-09-19 VITALS — BP 112/48 | HR 56 | Ht 62.0 in | Wt 137.0 lb

## 2015-09-19 DIAGNOSIS — I5032 Chronic diastolic (congestive) heart failure: Secondary | ICD-10-CM | POA: Diagnosis not present

## 2015-09-19 DIAGNOSIS — Z7901 Long term (current) use of anticoagulants: Secondary | ICD-10-CM | POA: Diagnosis not present

## 2015-09-19 DIAGNOSIS — I480 Paroxysmal atrial fibrillation: Secondary | ICD-10-CM | POA: Diagnosis not present

## 2015-09-19 DIAGNOSIS — I10 Essential (primary) hypertension: Secondary | ICD-10-CM

## 2015-09-19 DIAGNOSIS — E785 Hyperlipidemia, unspecified: Secondary | ICD-10-CM

## 2015-09-19 DIAGNOSIS — I2510 Atherosclerotic heart disease of native coronary artery without angina pectoris: Secondary | ICD-10-CM | POA: Diagnosis not present

## 2015-09-19 LAB — POCT INR: INR: 4.6

## 2015-09-19 MED ORDER — METOPROLOL TARTRATE 25 MG PO TABS: 12.5000 mg | ORAL_TABLET | Freq: Two times a day (BID) | ORAL | Status: DC

## 2015-09-19 NOTE — Patient Instructions (Addendum)
Medication Instructions:  Your physician has recommended you make the following change in your medication:  1) STOP METOPROLOL  Labwork: None  Testing/Procedures: None  Follow-Up: Your physician wants you to follow-up in: 6 months with Dr. Radford Pax. You will receive a reminder letter in the mail two months in advance. If you don't receive a letter, please call our office to schedule the follow-up appointment.   Any Other Special Instructions Will Be Listed Below (If Applicable).     If you need a refill on your cardiac medications before your next appointment, please call your pharmacy.

## 2015-09-21 ENCOUNTER — Encounter: Payer: Self-pay | Admitting: Cardiology

## 2015-09-24 DIAGNOSIS — D649 Anemia, unspecified: Secondary | ICD-10-CM | POA: Diagnosis not present

## 2015-09-24 DIAGNOSIS — R300 Dysuria: Secondary | ICD-10-CM | POA: Diagnosis not present

## 2015-09-24 DIAGNOSIS — E538 Deficiency of other specified B group vitamins: Secondary | ICD-10-CM | POA: Diagnosis not present

## 2015-09-24 DIAGNOSIS — E119 Type 2 diabetes mellitus without complications: Secondary | ICD-10-CM | POA: Diagnosis not present

## 2015-09-26 ENCOUNTER — Ambulatory Visit (INDEPENDENT_AMBULATORY_CARE_PROVIDER_SITE_OTHER): Payer: Medicare Other | Admitting: *Deleted

## 2015-09-26 DIAGNOSIS — Z7901 Long term (current) use of anticoagulants: Secondary | ICD-10-CM

## 2015-09-26 DIAGNOSIS — I480 Paroxysmal atrial fibrillation: Secondary | ICD-10-CM | POA: Diagnosis not present

## 2015-09-26 LAB — POCT INR: INR: 2.1

## 2015-10-03 DIAGNOSIS — R300 Dysuria: Secondary | ICD-10-CM | POA: Diagnosis not present

## 2015-10-03 DIAGNOSIS — E539 Vitamin B deficiency, unspecified: Secondary | ICD-10-CM | POA: Diagnosis not present

## 2015-10-03 DIAGNOSIS — E119 Type 2 diabetes mellitus without complications: Secondary | ICD-10-CM | POA: Diagnosis not present

## 2015-10-10 ENCOUNTER — Ambulatory Visit (INDEPENDENT_AMBULATORY_CARE_PROVIDER_SITE_OTHER): Payer: Medicare Other | Admitting: *Deleted

## 2015-10-10 DIAGNOSIS — Z7901 Long term (current) use of anticoagulants: Secondary | ICD-10-CM

## 2015-10-10 DIAGNOSIS — I480 Paroxysmal atrial fibrillation: Secondary | ICD-10-CM | POA: Diagnosis not present

## 2015-10-10 LAB — POCT INR: INR: 2.5

## 2015-10-11 DIAGNOSIS — E539 Vitamin B deficiency, unspecified: Secondary | ICD-10-CM | POA: Diagnosis not present

## 2015-10-18 DIAGNOSIS — E539 Vitamin B deficiency, unspecified: Secondary | ICD-10-CM | POA: Diagnosis not present

## 2015-10-24 DIAGNOSIS — E539 Vitamin B deficiency, unspecified: Secondary | ICD-10-CM | POA: Diagnosis not present

## 2015-10-31 ENCOUNTER — Ambulatory Visit (INDEPENDENT_AMBULATORY_CARE_PROVIDER_SITE_OTHER): Payer: Medicare Other | Admitting: *Deleted

## 2015-10-31 DIAGNOSIS — Z7901 Long term (current) use of anticoagulants: Secondary | ICD-10-CM

## 2015-10-31 DIAGNOSIS — I480 Paroxysmal atrial fibrillation: Secondary | ICD-10-CM | POA: Diagnosis not present

## 2015-10-31 LAB — POCT INR: INR: 2.2

## 2015-11-13 ENCOUNTER — Other Ambulatory Visit: Payer: Self-pay | Admitting: Cardiology

## 2015-11-21 DIAGNOSIS — E538 Deficiency of other specified B group vitamins: Secondary | ICD-10-CM | POA: Diagnosis not present

## 2015-11-21 DIAGNOSIS — D649 Anemia, unspecified: Secondary | ICD-10-CM | POA: Diagnosis not present

## 2015-11-28 ENCOUNTER — Ambulatory Visit (INDEPENDENT_AMBULATORY_CARE_PROVIDER_SITE_OTHER): Payer: Medicare Other | Admitting: *Deleted

## 2015-11-28 DIAGNOSIS — Z7901 Long term (current) use of anticoagulants: Secondary | ICD-10-CM | POA: Diagnosis not present

## 2015-11-28 DIAGNOSIS — I480 Paroxysmal atrial fibrillation: Secondary | ICD-10-CM

## 2015-11-28 LAB — POCT INR: INR: 1.8

## 2015-12-12 DIAGNOSIS — E538 Deficiency of other specified B group vitamins: Secondary | ICD-10-CM | POA: Diagnosis not present

## 2015-12-19 ENCOUNTER — Ambulatory Visit (INDEPENDENT_AMBULATORY_CARE_PROVIDER_SITE_OTHER): Payer: Medicare Other | Admitting: *Deleted

## 2015-12-19 DIAGNOSIS — I480 Paroxysmal atrial fibrillation: Secondary | ICD-10-CM

## 2015-12-19 DIAGNOSIS — Z7901 Long term (current) use of anticoagulants: Secondary | ICD-10-CM | POA: Diagnosis not present

## 2015-12-19 LAB — POCT INR: INR: 1.7

## 2016-01-01 ENCOUNTER — Other Ambulatory Visit: Payer: Self-pay | Admitting: Cardiology

## 2016-01-03 ENCOUNTER — Ambulatory Visit (INDEPENDENT_AMBULATORY_CARE_PROVIDER_SITE_OTHER): Payer: Medicare Other | Admitting: *Deleted

## 2016-01-03 DIAGNOSIS — Z7901 Long term (current) use of anticoagulants: Secondary | ICD-10-CM

## 2016-01-03 DIAGNOSIS — I480 Paroxysmal atrial fibrillation: Secondary | ICD-10-CM | POA: Diagnosis not present

## 2016-01-03 LAB — POCT INR: INR: 2.3

## 2016-01-16 ENCOUNTER — Other Ambulatory Visit: Payer: Self-pay | Admitting: Cardiology

## 2016-01-31 ENCOUNTER — Ambulatory Visit (INDEPENDENT_AMBULATORY_CARE_PROVIDER_SITE_OTHER): Payer: Medicare Other | Admitting: *Deleted

## 2016-01-31 DIAGNOSIS — I480 Paroxysmal atrial fibrillation: Secondary | ICD-10-CM | POA: Diagnosis not present

## 2016-01-31 DIAGNOSIS — Z7901 Long term (current) use of anticoagulants: Secondary | ICD-10-CM | POA: Diagnosis not present

## 2016-01-31 LAB — POCT INR: INR: 2.8

## 2016-02-27 DIAGNOSIS — E538 Deficiency of other specified B group vitamins: Secondary | ICD-10-CM | POA: Diagnosis not present

## 2016-02-27 DIAGNOSIS — E119 Type 2 diabetes mellitus without complications: Secondary | ICD-10-CM | POA: Diagnosis not present

## 2016-02-27 DIAGNOSIS — R300 Dysuria: Secondary | ICD-10-CM | POA: Diagnosis not present

## 2016-02-27 DIAGNOSIS — I10 Essential (primary) hypertension: Secondary | ICD-10-CM | POA: Diagnosis not present

## 2016-02-27 DIAGNOSIS — I4891 Unspecified atrial fibrillation: Secondary | ICD-10-CM | POA: Diagnosis not present

## 2016-02-27 DIAGNOSIS — Z7984 Long term (current) use of oral hypoglycemic drugs: Secondary | ICD-10-CM | POA: Diagnosis not present

## 2016-02-27 DIAGNOSIS — I2510 Atherosclerotic heart disease of native coronary artery without angina pectoris: Secondary | ICD-10-CM | POA: Diagnosis not present

## 2016-02-27 DIAGNOSIS — Z23 Encounter for immunization: Secondary | ICD-10-CM | POA: Diagnosis not present

## 2016-02-27 DIAGNOSIS — E785 Hyperlipidemia, unspecified: Secondary | ICD-10-CM | POA: Diagnosis not present

## 2016-02-27 DIAGNOSIS — E039 Hypothyroidism, unspecified: Secondary | ICD-10-CM | POA: Diagnosis not present

## 2016-02-28 ENCOUNTER — Ambulatory Visit (INDEPENDENT_AMBULATORY_CARE_PROVIDER_SITE_OTHER): Payer: Medicare Other | Admitting: *Deleted

## 2016-02-28 DIAGNOSIS — I480 Paroxysmal atrial fibrillation: Secondary | ICD-10-CM

## 2016-02-28 DIAGNOSIS — Z7901 Long term (current) use of anticoagulants: Secondary | ICD-10-CM

## 2016-02-28 LAB — POCT INR: INR: 3.2

## 2016-03-29 ENCOUNTER — Other Ambulatory Visit: Payer: Self-pay | Admitting: Cardiology

## 2016-04-01 ENCOUNTER — Ambulatory Visit (INDEPENDENT_AMBULATORY_CARE_PROVIDER_SITE_OTHER): Payer: Medicare Other | Admitting: *Deleted

## 2016-04-01 DIAGNOSIS — I480 Paroxysmal atrial fibrillation: Secondary | ICD-10-CM | POA: Diagnosis not present

## 2016-04-01 DIAGNOSIS — Z7901 Long term (current) use of anticoagulants: Secondary | ICD-10-CM

## 2016-04-01 LAB — POCT INR: INR: 2.3

## 2016-05-07 ENCOUNTER — Ambulatory Visit (INDEPENDENT_AMBULATORY_CARE_PROVIDER_SITE_OTHER): Payer: Medicare Other | Admitting: *Deleted

## 2016-05-07 DIAGNOSIS — Z7901 Long term (current) use of anticoagulants: Secondary | ICD-10-CM | POA: Diagnosis not present

## 2016-05-07 DIAGNOSIS — I480 Paroxysmal atrial fibrillation: Secondary | ICD-10-CM

## 2016-05-07 DIAGNOSIS — I2510 Atherosclerotic heart disease of native coronary artery without angina pectoris: Secondary | ICD-10-CM

## 2016-05-07 LAB — POCT INR: INR: 3.1

## 2016-05-19 ENCOUNTER — Other Ambulatory Visit: Payer: Self-pay | Admitting: Cardiology

## 2016-05-28 ENCOUNTER — Encounter: Payer: Self-pay | Admitting: Cardiology

## 2016-06-02 ENCOUNTER — Ambulatory Visit (INDEPENDENT_AMBULATORY_CARE_PROVIDER_SITE_OTHER): Payer: Medicare Other | Admitting: Pharmacist

## 2016-06-02 ENCOUNTER — Encounter: Payer: Self-pay | Admitting: Cardiology

## 2016-06-02 ENCOUNTER — Ambulatory Visit (INDEPENDENT_AMBULATORY_CARE_PROVIDER_SITE_OTHER): Payer: Medicare Other | Admitting: Cardiology

## 2016-06-02 VITALS — BP 138/62 | HR 90 | Ht 62.0 in | Wt 122.0 lb

## 2016-06-02 DIAGNOSIS — I4819 Other persistent atrial fibrillation: Secondary | ICD-10-CM

## 2016-06-02 DIAGNOSIS — I481 Persistent atrial fibrillation: Secondary | ICD-10-CM

## 2016-06-02 DIAGNOSIS — I10 Essential (primary) hypertension: Secondary | ICD-10-CM | POA: Diagnosis not present

## 2016-06-02 DIAGNOSIS — I2510 Atherosclerotic heart disease of native coronary artery without angina pectoris: Secondary | ICD-10-CM | POA: Diagnosis not present

## 2016-06-02 DIAGNOSIS — I5032 Chronic diastolic (congestive) heart failure: Secondary | ICD-10-CM | POA: Diagnosis not present

## 2016-06-02 DIAGNOSIS — Z7901 Long term (current) use of anticoagulants: Secondary | ICD-10-CM

## 2016-06-02 DIAGNOSIS — E7800 Pure hypercholesterolemia, unspecified: Secondary | ICD-10-CM | POA: Diagnosis not present

## 2016-06-02 DIAGNOSIS — I480 Paroxysmal atrial fibrillation: Secondary | ICD-10-CM

## 2016-06-02 LAB — HEPATIC FUNCTION PANEL
ALK PHOS: 42 U/L (ref 33–130)
ALT: 8 U/L (ref 6–29)
AST: 13 U/L (ref 10–35)
Albumin: 3.8 g/dL (ref 3.6–5.1)
BILIRUBIN DIRECT: 0.1 mg/dL (ref <=0.2)
BILIRUBIN TOTAL: 0.6 mg/dL (ref 0.2–1.2)
Indirect Bilirubin: 0.5 mg/dL (ref 0.2–1.2)
Total Protein: 6.5 g/dL (ref 6.1–8.1)

## 2016-06-02 LAB — LIPID PANEL
CHOL/HDL RATIO: 3.2 ratio (ref <5.0)
Cholesterol: 146 mg/dL (ref <200)
HDL: 45 mg/dL — ABNORMAL LOW (ref >50)
LDL Cholesterol: 87 mg/dL (ref <100)
Triglycerides: 72 mg/dL (ref <150)
VLDL: 14 mg/dL (ref <30)

## 2016-06-02 LAB — POCT INR: INR: 2.3

## 2016-06-02 LAB — BASIC METABOLIC PANEL
BUN: 26 mg/dL — ABNORMAL HIGH (ref 7–25)
CHLORIDE: 105 mmol/L (ref 98–110)
CO2: 23 mmol/L (ref 20–31)
Calcium: 9.1 mg/dL (ref 8.6–10.4)
Creat: 1.07 mg/dL — ABNORMAL HIGH (ref 0.60–0.88)
GLUCOSE: 85 mg/dL (ref 65–99)
POTASSIUM: 4.4 mmol/L (ref 3.5–5.3)
SODIUM: 139 mmol/L (ref 135–146)

## 2016-06-02 NOTE — Patient Instructions (Signed)
Medication Instructions:  Your physician recommends that you continue on your current medications as directed. Please refer to the Current Medication list given to you today.   Labwork: TODAY: BMET, LFTs, Lipids  Testing/Procedures: None  Follow-Up: Your physician wants you to follow-up in: 6 months with Dr. Turner. You will receive a reminder letter in the mail two months in advance. If you don't receive a letter, please call our office to schedule the follow-up appointment.   Any Other Special Instructions Will Be Listed Below (If Applicable).     If you need a refill on your cardiac medications before your next appointment, please call your pharmacy.   

## 2016-06-02 NOTE — Progress Notes (Signed)
Cardiology Office Note    Date:  06/02/2016   ID:  NERVIE LANGTON, DOB 1931-09-10, MRN [id number]  PCP:  London Pepper, MD  Cardiologist:  Fransico Him, MD   Chief Complaint  Patient presents with  . Atrial Fibrillation  . Coronary Artery Disease  . Hypertension    History of Present Illness:  Ana [name] is a 82 y.o. female  with a hx of PAFib, diastolic CHF, pulmonary HTN, mild mitral stenosis (echo 09/2012), HTN, HL, anemia of chronic disease. She has been kept on coumadin instead of NOAC due to valvular heart dsz. She also has a history of ASCAD with LHC demonstrating multivessel CAD and underwent PCI with BMS to the LAD and CFX and subsequent staged PCI with BMS to the RCA. EF was preserved at 50%.   She returns for FU.She walks with a walker. She denies any chest pain, shortness of breath, PND, orthopnea, dizziness, palpitations, syncope or significant pedal edema.   Past Medical History:  Diagnosis Date  . Anemia of chronic disease   . Chronic diastolic CHF (congestive heart failure) ([place])   . Diabetes mellitus without complication ([place])   . Dizziness 02/13/2015  . Hyperlipidemia   . Hypertension   . Hypothyroidism   . MI, old 07/18/2014  . Mitral stenosis   . Persistent atrial fibrillation ([place])   . Pulmonary HTN    no evidence on last echo 2015  . Vitamin D deficiency disease     Past Surgical History:  Procedure Laterality Date  . CATARACT EXTRACTION    . LEFT HEART CATHETERIZATION WITH CORONARY ANGIOGRAM N/A 01/17/2014   Procedure: LEFT HEART CATHETERIZATION WITH CORONARY ANGIOGRAM;  Surgeon: Troy Sine, MD;  Location: [facility] CATH LAB;  Service: Cardiovascular;  Laterality: N/A;  . PERCUTANEOUS CORONARY STENT INTERVENTION (PCI-S)  01/17/2014   Procedure: PERCUTANEOUS CORONARY STENT INTERVENTION (PCI-S);  Surgeon: Troy Sine, MD;  Location: [facility] CATH LAB;  Service: Cardiovascular;;  . PERCUTANEOUS CORONARY STENT INTERVENTION (PCI-S) N/A 01/19/2014   Procedure: PERCUTANEOUS CORONARY STENT INTERVENTION (PCI-S);  Surgeon: Leonie Man, MD;  Location: [facility] C[facility] LAB;  Service: Cardiovascular;  Laterality: N/A;    Current Medications: Outpatient Medications Prior to Visit  Medication Sig Dispense Refill  . amLODipine (NORVASC) 5 MG tablet TAKE ONE TABLET BY MOUTH ONCE DAILY. 180 tablet 2  . aspirin 81 MG tablet Take 81 mg by mouth at bedtime.     . Cholecalciferol (VITAMIN D3) 2000 UNITS TABS Take 2,000 Units by mouth at bedtime.     . Coenzyme Q10 (CO Q 10) 10 MG CAPS Take 10 mg by mouth at bedtime.     Marland Kitchen levothyroxine (SYNTHROID, LEVOTHROID) 88 MCG tablet Take 88 mcg by mouth daily before breakfast.    . losartan (COZAAR) 100 MG tablet TAKE ONE TABLET BY MOUTH ONCE DAILY 90 tablet 1  . metFORMIN (GLUCOPHAGE) 1000 MG tablet Take 1 tablet (1,000 mg total) by mouth 2 (two) times daily with a meal. 60 tablet 0  . Omega-3 Krill Oil 500 MG CAPS Take 1 capsule by mouth at bedtime.     Marland Kitchen warfarin (COUMADIN) 2.5 MG tablet TAKE AS DIRECTED BY  COUMADIN  CLINIC 30 tablet 3   No facility-administered medications prior to visit.      Allergies:   Welchol [colesevelam hcl]; Zetia [ezetimibe]; Crestor [rosuvastatin]; and Zocor [simvastatin]   Social History   Social History  . Marital status: Married    Spouse name: N/A  . Number  of children: N/A  . Years of education: N/A   Social History Main Topics  . Smoking status: Never Smoker  . Smokeless tobacco: Never Used  . Alcohol use No  . Drug use: No  . Sexual activity: Not Asked   Other Topics Concern  . None   Social History Narrative  . None     Family History:  The patient's family history includes Cancer in her mother; Cirrhosis in her father; Diabetes in her brother; Heart attack in her sister; Heart disease in her sister.   ROS:   Please see the history of present illness.    ROS All other systems reviewed and are negative.  No flowsheet data found.     PHYSICAL EXAM:     VS:  BP 138/62   Pulse 90   Ht 5\' 2"  (1.575 m)   Wt 122 lb (55.3 kg)   BMI 22.31 kg/m    GEN: Well nourished, well developed, in no acute distress  HEENT: normal  Neck: no JVD, carotid bruits, or masses Cardiac: RRR; no murmurs, rubs, or gallops,no edema.  Intact distal pulses bilaterally.  Respiratory:  clear to auscultation bilaterally, normal work of breathing GI: soft, nontender, nondistended, + BS MS: no deformity or atrophy  Skin: warm and dry, no rash Neuro:  Alert and Oriented x 3, Strength and sensation are intact Psych: euthymic mood, full affect  Wt Readings from Last 3 Encounters:  06/02/16 122 lb (55.3 kg)  09/19/15 137 lb (62.1 kg)  02/13/15 162 lb 12.8 oz (73.8 kg)      Studies/Labs Reviewed:   EKG:  EKG is not ordered today.    Recent Labs: No results found for requested labs within last 8760 hours.   Lipid Panel    Component Value Date/Time   CHOL 157 01/15/2014 0225   TRIG 95 01/15/2014 0225   HDL 47 01/15/2014 0225   CHOLHDL 3.3 01/15/2014 0225   VLDL 19 01/15/2014 0225   LDLCALC 91 01/15/2014 0225    Additional studies/ records that were reviewed today include:  none    ASSESSMENT:    1. Persistent atrial fibrillation ([place])   2. Essential hypertension   3. Chronic diastolic CHF (congestive heart failure) ([place])   4. Atherosclerosis of native coronary artery of native heart without angina pectoris   5. Pure hypercholesterolemia      PLAN:  In order of problems listed above:  1. Persistent atrial fibrillation maintaining NSR.  Continue warfarin. INR followed in our coumadin clinic. 2. HTN - BP controlled on current meds.  Continue amlodipine and ARB. 3. Chronic diastolic CHF - she appears euvolemic on exam.   4. ASCAD multivessel CAD and underwent PCI with BMS to the LAD and CFX and subsequent staged PCI with BMS to the RCA.with remote MI - she has not had any anginal CP.  No ASA due to warfarin. She has not been on statin therapy in  some time and does not know shy it was stopped.  I will check an FLP and ALT and start lipid lowering therapy guided by labs.   5. Hyperlipidemia - LDL goal < 70.  Check FLP and ALT.      Medication Adjustments/Labs and Tests Ordered: Current medicines are reviewed at length with the patient today.  Concerns regarding medicines are outlined above.  Medication changes, Labs and Tests ordered today are listed in the Patient Instructions below.  There are no Patient Instructions on file for this visit.  Signed, Fransico Him, MD  06/02/2016 10:33 AM    [facility] [place], [place], [place]  [address] Phone: [phone]; Fax: [phone]

## 2016-06-10 ENCOUNTER — Telehealth: Payer: Self-pay

## 2016-06-10 DIAGNOSIS — E785 Hyperlipidemia, unspecified: Secondary | ICD-10-CM

## 2016-06-10 MED ORDER — ATORVASTATIN CALCIUM 10 MG PO TABS: 10.0000 mg | ORAL_TABLET | Freq: Every day | ORAL | 11 refills | Status: DC

## 2016-06-10 NOTE — Telephone Encounter (Signed)
-----   Message from Sueanne Margarita, MD sent at 06/02/2016 11:52 PM EST ----- LDL not at goal - start Lipitor 10mg  daily and repeat FLP and ALT in 6 weeks

## 2016-06-10 NOTE — Telephone Encounter (Signed)
Informed patient of results and verbal understanding expressed.   Instructed patient to START LIPITOR 10 mg daily. FLP and ALT scheduled 07/24/16. Patient agrees with treatment plan.

## 2016-07-02 ENCOUNTER — Ambulatory Visit (INDEPENDENT_AMBULATORY_CARE_PROVIDER_SITE_OTHER): Payer: Medicare Other | Admitting: *Deleted

## 2016-07-02 DIAGNOSIS — I480 Paroxysmal atrial fibrillation: Secondary | ICD-10-CM

## 2016-07-02 DIAGNOSIS — I481 Persistent atrial fibrillation: Secondary | ICD-10-CM | POA: Diagnosis not present

## 2016-07-02 DIAGNOSIS — I4819 Other persistent atrial fibrillation: Secondary | ICD-10-CM

## 2016-07-02 DIAGNOSIS — Z7901 Long term (current) use of anticoagulants: Secondary | ICD-10-CM | POA: Diagnosis not present

## 2016-07-02 LAB — POCT INR: INR: 3

## 2016-07-24 ENCOUNTER — Other Ambulatory Visit: Payer: Medicare Other | Admitting: *Deleted

## 2016-07-24 ENCOUNTER — Ambulatory Visit (INDEPENDENT_AMBULATORY_CARE_PROVIDER_SITE_OTHER): Payer: Medicare Other | Admitting: *Deleted

## 2016-07-24 DIAGNOSIS — Z7901 Long term (current) use of anticoagulants: Secondary | ICD-10-CM | POA: Diagnosis not present

## 2016-07-24 DIAGNOSIS — I481 Persistent atrial fibrillation: Secondary | ICD-10-CM

## 2016-07-24 DIAGNOSIS — I480 Paroxysmal atrial fibrillation: Secondary | ICD-10-CM | POA: Diagnosis not present

## 2016-07-24 DIAGNOSIS — E785 Hyperlipidemia, unspecified: Secondary | ICD-10-CM | POA: Diagnosis not present

## 2016-07-24 DIAGNOSIS — I4819 Other persistent atrial fibrillation: Secondary | ICD-10-CM

## 2016-07-24 LAB — POCT INR: INR: 4.1

## 2016-08-13 ENCOUNTER — Ambulatory Visit (INDEPENDENT_AMBULATORY_CARE_PROVIDER_SITE_OTHER): Payer: Medicare Other | Admitting: *Deleted

## 2016-08-13 DIAGNOSIS — Z7901 Long term (current) use of anticoagulants: Secondary | ICD-10-CM

## 2016-08-13 DIAGNOSIS — I481 Persistent atrial fibrillation: Secondary | ICD-10-CM

## 2016-08-13 DIAGNOSIS — I4819 Other persistent atrial fibrillation: Secondary | ICD-10-CM

## 2016-08-13 DIAGNOSIS — I480 Paroxysmal atrial fibrillation: Secondary | ICD-10-CM | POA: Diagnosis not present

## 2016-08-13 LAB — POCT INR: INR: 3.6

## 2016-09-04 ENCOUNTER — Ambulatory Visit (INDEPENDENT_AMBULATORY_CARE_PROVIDER_SITE_OTHER): Payer: Medicare Other | Admitting: *Deleted

## 2016-09-04 DIAGNOSIS — I4819 Other persistent atrial fibrillation: Secondary | ICD-10-CM

## 2016-09-04 DIAGNOSIS — I481 Persistent atrial fibrillation: Secondary | ICD-10-CM | POA: Diagnosis not present

## 2016-09-04 DIAGNOSIS — I480 Paroxysmal atrial fibrillation: Secondary | ICD-10-CM

## 2016-09-04 DIAGNOSIS — Z7901 Long term (current) use of anticoagulants: Secondary | ICD-10-CM

## 2016-09-04 LAB — POCT INR: INR: 1.7

## 2016-09-25 ENCOUNTER — Ambulatory Visit (INDEPENDENT_AMBULATORY_CARE_PROVIDER_SITE_OTHER): Payer: Medicare Other | Admitting: Pharmacist

## 2016-09-25 DIAGNOSIS — Z7901 Long term (current) use of anticoagulants: Secondary | ICD-10-CM

## 2016-09-25 DIAGNOSIS — I4819 Other persistent atrial fibrillation: Secondary | ICD-10-CM

## 2016-09-25 DIAGNOSIS — I480 Paroxysmal atrial fibrillation: Secondary | ICD-10-CM | POA: Diagnosis not present

## 2016-09-25 DIAGNOSIS — I481 Persistent atrial fibrillation: Secondary | ICD-10-CM

## 2016-09-25 LAB — POCT INR: INR: 1.9

## 2016-09-29 ENCOUNTER — Other Ambulatory Visit: Payer: Self-pay | Admitting: Cardiology

## 2016-10-15 ENCOUNTER — Encounter (INDEPENDENT_AMBULATORY_CARE_PROVIDER_SITE_OTHER): Payer: Self-pay

## 2016-10-15 ENCOUNTER — Ambulatory Visit (INDEPENDENT_AMBULATORY_CARE_PROVIDER_SITE_OTHER): Payer: Medicare Other | Admitting: *Deleted

## 2016-10-15 DIAGNOSIS — Z7901 Long term (current) use of anticoagulants: Secondary | ICD-10-CM

## 2016-10-15 DIAGNOSIS — I481 Persistent atrial fibrillation: Secondary | ICD-10-CM

## 2016-10-15 DIAGNOSIS — I4819 Other persistent atrial fibrillation: Secondary | ICD-10-CM

## 2016-10-15 DIAGNOSIS — I480 Paroxysmal atrial fibrillation: Secondary | ICD-10-CM | POA: Diagnosis not present

## 2016-10-15 LAB — POCT INR: INR: 1.7

## 2016-10-26 ENCOUNTER — Other Ambulatory Visit: Payer: Self-pay | Admitting: Cardiology

## 2016-10-31 ENCOUNTER — Ambulatory Visit (INDEPENDENT_AMBULATORY_CARE_PROVIDER_SITE_OTHER): Payer: Medicare Other | Admitting: *Deleted

## 2016-10-31 DIAGNOSIS — I480 Paroxysmal atrial fibrillation: Secondary | ICD-10-CM | POA: Diagnosis not present

## 2016-10-31 DIAGNOSIS — I4819 Other persistent atrial fibrillation: Secondary | ICD-10-CM

## 2016-10-31 DIAGNOSIS — Z7901 Long term (current) use of anticoagulants: Secondary | ICD-10-CM | POA: Diagnosis not present

## 2016-10-31 DIAGNOSIS — I481 Persistent atrial fibrillation: Secondary | ICD-10-CM | POA: Diagnosis not present

## 2016-10-31 LAB — POCT INR: INR: 1.7

## 2016-11-19 ENCOUNTER — Encounter (HOSPITAL_COMMUNITY): Payer: Self-pay | Admitting: Nurse Practitioner

## 2016-11-19 ENCOUNTER — Ambulatory Visit (INDEPENDENT_AMBULATORY_CARE_PROVIDER_SITE_OTHER): Payer: Medicare Other | Admitting: Interventional Cardiology

## 2016-11-19 ENCOUNTER — Emergency Department (HOSPITAL_COMMUNITY)
Admission: EM | Admit: 2016-11-19 | Discharge: 2016-11-19 | Disposition: A | Discharge status: Home / Self Care | Payer: Medicare Other | Attending: Emergency Medicine | Admitting: Emergency Medicine

## 2016-11-19 DIAGNOSIS — I5032 Chronic diastolic (congestive) heart failure: Secondary | ICD-10-CM | POA: Diagnosis not present

## 2016-11-19 DIAGNOSIS — I481 Persistent atrial fibrillation: Secondary | ICD-10-CM

## 2016-11-19 DIAGNOSIS — I10 Essential (primary) hypertension: Secondary | ICD-10-CM | POA: Diagnosis not present

## 2016-11-19 DIAGNOSIS — N390 Urinary tract infection, site not specified: Secondary | ICD-10-CM | POA: Insufficient documentation

## 2016-11-19 DIAGNOSIS — K6289 Other specified diseases of anus and rectum: Secondary | ICD-10-CM | POA: Diagnosis not present

## 2016-11-19 DIAGNOSIS — Z7982 Long term (current) use of aspirin: Secondary | ICD-10-CM | POA: Diagnosis not present

## 2016-11-19 DIAGNOSIS — R031 Nonspecific low blood-pressure reading: Secondary | ICD-10-CM | POA: Diagnosis not present

## 2016-11-19 DIAGNOSIS — Z7984 Long term (current) use of oral hypoglycemic drugs: Secondary | ICD-10-CM | POA: Insufficient documentation

## 2016-11-19 DIAGNOSIS — I252 Old myocardial infarction: Secondary | ICD-10-CM | POA: Insufficient documentation

## 2016-11-19 DIAGNOSIS — E119 Type 2 diabetes mellitus without complications: Secondary | ICD-10-CM | POA: Insufficient documentation

## 2016-11-19 DIAGNOSIS — I4819 Other persistent atrial fibrillation: Secondary | ICD-10-CM

## 2016-11-19 DIAGNOSIS — Z7901 Long term (current) use of anticoagulants: Secondary | ICD-10-CM | POA: Diagnosis not present

## 2016-11-19 LAB — URINALYSIS, ROUTINE W REFLEX MICROSCOPIC
BILIRUBIN URINE: NEGATIVE
Glucose, UA: NEGATIVE mg/dL
KETONES UR: NEGATIVE mg/dL
NITRITE: NEGATIVE
Protein, ur: 30 mg/dL — AB
SPECIFIC GRAVITY, URINE: 1.009 (ref 1.005–1.030)
pH: 7 (ref 5.0–8.0)

## 2016-11-19 LAB — BASIC METABOLIC PANEL
Anion gap: 9 (ref 5–15)
BUN: 21 mg/dL — ABNORMAL HIGH (ref 6–20)
CALCIUM: 9.2 mg/dL (ref 8.9–10.3)
CHLORIDE: 106 mmol/L (ref 101–111)
CO2: 23 mmol/L (ref 22–32)
CREATININE: 0.8 mg/dL (ref 0.44–1.00)
Glucose, Bld: 95 mg/dL (ref 65–99)
Potassium: 3.8 mmol/L (ref 3.5–5.1)
SODIUM: 138 mmol/L (ref 135–145)

## 2016-11-19 LAB — PROTIME-INR
INR: 1.67
PROTHROMBIN TIME: 19.9 s — AB (ref 11.4–15.2)

## 2016-11-19 LAB — CBC
HCT: 33.2 % — ABNORMAL LOW (ref 36.0–46.0)
Hemoglobin: 11.1 g/dL — ABNORMAL LOW (ref 12.0–15.0)
MCH: 30.3 pg (ref 26.0–34.0)
MCHC: 33.4 g/dL (ref 30.0–36.0)
MCV: 90.7 fL (ref 78.0–100.0)
PLATELETS: 216 10*3/uL (ref 150–400)
RBC: 3.66 MIL/uL — ABNORMAL LOW (ref 3.87–5.11)
RDW: 13.8 % (ref 11.5–15.5)
WBC: 5.3 10*3/uL (ref 4.0–10.5)

## 2016-11-19 MED ORDER — CEPHALEXIN 500 MG PO CAPS
500.0000 mg | ORAL_CAPSULE | Freq: Once | ORAL | Status: AC
  Administered 2016-11-19: 500 mg via ORAL
  Filled 2016-11-19: qty 1

## 2016-11-19 MED ORDER — CEPHALEXIN 500 MG PO CAPS: 500.0000 mg | ORAL_CAPSULE | Freq: Three times a day (TID) | ORAL | 0 refills | Status: DC

## 2016-11-19 NOTE — ED Triage Notes (Signed)
Patient presents to [facility] via [facility] from home where she resides with her husband. Complains of new rectal pain today which she believes may be hemorrhoids. Felt pain was worse today and needed evaluation. She has been trying otc remedies at home w/o success.  She also complains of burning with urination which she would like evaluated.

## 2016-11-19 NOTE — Discharge Instructions (Signed)
It was our pleasure to provide your ER care today - we hope that you feel better.  Your inr/coumadin level is low 1.67 - contact your doctor to see if they want to make any dose adjustment.  Your other lab tests looks good.  Rest. Drink plenty of fluids.  Take keflex (antibiotic) as prescribed.  Take acetaminophen as need for pain.  Follow up with your doctor in 1 week.  Return to ER if worse, new symptoms, fevers, worsening or severe abdominal pain, persistent vomiting, other concern.

## 2016-11-19 NOTE — ED Notes (Signed)
UPdate given to family. Pt transported to bathroom on stedy by NT

## 2016-11-19 NOTE — ED Notes (Signed)
Bed: OV56 Expected date:  Expected time:  Means of arrival:  Comments: 83 yo F/ Rectal pain

## 2016-11-19 NOTE — ED Provider Notes (Signed)
[place] DEPT Provider Note   CSN: [id number] Arrival date & time: 11/19/16  0718     History   Chief Complaint Chief Complaint  Patient presents with  . Hemorrhoids    rectal pain  . Dysuria    HPI Ana [name] is a 83 y.o. female.  Patient with hx htn, c/o dysuria, urinary urgency for the past 1-2 days. Symptoms persistent, constant. No nausea or vomiting. No back or flank pain. No fever or chills. Normal appetite.    The history is provided by the patient.  Dysuria   Pertinent negatives include no chills, no vomiting and no flank pain.    Past Medical History:  Diagnosis Date  . Anemia of chronic disease   . Chronic diastolic CHF (congestive heart failure) ([place])   . Diabetes mellitus without complication ([place])   . Dizziness 02/13/2015  . Hyperlipidemia   . Hypertension   . Hypothyroidism   . MI, old 07/18/2014  . Mitral stenosis   . Persistent atrial fibrillation ([place])   . Pulmonary HTN ([place])    no evidence on last echo 2015  . Vitamin D deficiency disease     Patient Active Problem List   Diagnosis Date Noted  . Dizziness 02/13/2015  . MI, old 07/18/2014  . Coronary atherosclerosis of native coronary artery 02/21/2014  . HLD (hyperlipidemia) 02/21/2014  . Long term (current) use of anticoagulants 11/10/2013  . Persistent atrial fibrillation ([place]) 11/03/2013  . Chronic diastolic CHF (congestive heart failure) ([place]) 11/03/2013  . Essential hypertension     Past Surgical History:  Procedure Laterality Date  . CATARACT EXTRACTION    . LEFT HEART CATHETERIZATION WITH CORONARY ANGIOGRAM N/A 01/17/2014   Procedure: LEFT HEART CATHETERIZATION WITH CORONARY ANGIOGRAM;  Surgeon: Troy Sine, MD;  Location: [facility] CATH LAB;  Service: Cardiovascular;  Laterality: N/A;  . PERCUTANEOUS CORONARY STENT INTERVENTION (PCI-S)  01/17/2014   Procedure: PERCUTANEOUS CORONARY STENT INTERVENTION (PCI-S);  Surgeon: Troy Sine, MD;  Location: [facility] CATH LAB;  Service:  Cardiovascular;;  . PERCUTANEOUS CORONARY STENT INTERVENTION (PCI-S) N/A 01/19/2014   Procedure: PERCUTANEOUS CORONARY STENT INTERVENTION (PCI-S);  Surgeon: Leonie Man, MD;  Location: [facility] CATH LAB;  Service: Cardiovascular;  Laterality: N/A;    OB History    No data available       Home Medications    Prior to Admission medications   Medication Sig Start Date End Date Taking? Authorizing Provider  amLODipine (NORVASC) 5 MG tablet TAKE ONE TABLET BY MOUTH ONCE DAILY. 01/16/16   Sueanne Margarita, MD  aspirin 81 MG tablet Take 81 mg by mouth at bedtime.     [provider]  atorvastatin (LIPITOR) 10 MG tablet Take 1 tablet (10 mg total) by mouth daily. 06/10/16 06/05/17  Sueanne Margarita, MD  Cholecalciferol (VITAMIN D3) 2000 UNITS TABS Take 2,000 Units by mouth at bedtime.     [provider]  Coenzyme Q10 (CO Q 10) 10 MG CAPS Take 10 mg by mouth at bedtime.     [provider]  levothyroxine (SYNTHROID, LEVOTHROID) 88 MCG tablet Take 88 mcg by mouth daily before breakfast.    [provider]  losartan (COZAAR) 100 MG tablet TAKE ONE TABLET BY MOUTH ONCE DAILY 09/30/16   Sueanne Margarita, MD  metFORMIN (GLUCOPHAGE) 1000 MG tablet Take 1 tablet (1,000 mg total) by mouth 2 (two) times daily with a meal. 02/16/14   Sueanne Margarita, MD  Omega-3 Krill Oil 500 MG CAPS  Take 1 capsule by mouth at bedtime.     [provider]  warfarin (COUMADIN) 2.5 MG tablet TAKE AS DIRECTED BY  COUMADIN  CLINIC 10/27/16   Sueanne Margarita, MD    Family History Family History  Problem Relation Age of Onset  . Cancer Mother   . Cirrhosis Father   . Heart attack Sister   . Heart disease Sister   . Diabetes Brother     Social History Social History  Substance Use Topics  . Smoking status: Never Smoker  . Smokeless tobacco: Never Used  . Alcohol use No     Allergies   Welchol [colesevelam hcl]; Zetia [ezetimibe]; Crestor [rosuvastatin]; and Zocor  [simvastatin]   Review of Systems Review of Systems  Constitutional: Negative for chills and fever.  HENT: Negative for sore throat.   Eyes: Negative for redness.  Respiratory: Negative for cough and shortness of breath.   Cardiovascular: Negative for chest pain.  Gastrointestinal: Negative for constipation, diarrhea and vomiting.  Genitourinary: Positive for dysuria. Negative for flank pain.  Musculoskeletal: Negative for back pain.  Skin: Negative for rash.  Neurological: Negative for headaches.  Hematological: Does not bruise/bleed easily.  Psychiatric/Behavioral: Negative for confusion.     Physical Exam Updated Vital Signs BP (!) 116/42 (BP Location: Right Arm)   Pulse 79   Temp 97.6 F (36.4 C) (Oral)   Resp 10   Ht 1.575 m (5\' 2" )   Wt 54.4 kg (120 lb)   SpO2 99%   BMI 21.95 kg/m   Physical Exam  Constitutional: She appears well-developed and well-nourished. No distress.  HENT:  Mouth/Throat: Oropharynx is clear and moist.  Eyes: Conjunctivae are normal. No scleral icterus.  Neck: Neck supple. No tracheal deviation present.  Cardiovascular: Normal rate, regular rhythm, normal heart sounds and intact distal pulses.  Exam reveals no gallop and no friction rub.   No murmur heard. Pulmonary/Chest: Effort normal and breath sounds normal. No respiratory distress.  Abdominal: Soft. Normal appearance and bowel sounds are normal. She exhibits no distension.  Mild suprapubic tenderness.   Genitourinary:  Genitourinary Comments: No cva tenderness. Rectal, no abscess, no external hemorrhoid, no mass felt, soft brown stool.   Musculoskeletal: She exhibits no edema or tenderness.  Neurological: She is alert.  Skin: Skin is warm and dry. No rash noted.  Psychiatric: She has a normal mood and affect.  Nursing note and vitals reviewed.    ED Treatments / Results  Labs (all labs ordered are listed, but only abnormal results are displayed) Results for orders placed or  performed during the hospital encounter of 11/19/16  Urinalysis, Routine w reflex microscopic  Result Value Ref Range   Color, Urine STRAW (A) YELLOW   APPearance CLEAR CLEAR   Specific Gravity, Urine 1.009 1.005 - 1.030   pH 7.0 5.0 - 8.0   Glucose, UA NEGATIVE NEGATIVE mg/dL   Hgb urine dipstick LARGE (A) NEGATIVE   Bilirubin Urine NEGATIVE NEGATIVE   Ketones, ur NEGATIVE NEGATIVE mg/dL   Protein, ur 30 (A) NEGATIVE mg/dL   Nitrite NEGATIVE NEGATIVE   Leukocytes, UA TRACE (A) NEGATIVE   RBC / HPF TOO NUMEROUS TO COUNT 0 - 5 RBC/hpf   WBC, UA 6-30 0 - 5 WBC/hpf   Bacteria, UA RARE (A) NONE SEEN   Squamous Epithelial / LPF 0-5 (A) NONE SEEN  CBC  Result Value Ref Range   WBC 5.3 4.0 - 10.5 K/uL   RBC 3.66 (L) 3.87 - 5.11 MIL/uL  Hemoglobin 11.1 (L) 12.0 - 15.0 g/dL   HCT 33.2 (L) 36.0 - 46.0 %   MCV 90.7 78.0 - 100.0 fL   MCH 30.3 26.0 - 34.0 pg   MCHC 33.4 30.0 - 36.0 g/dL   RDW 13.8 11.5 - 15.5 %   Platelets 216 150 - 400 K/uL  Basic metabolic panel  Result Value Ref Range   Sodium 138 135 - 145 mmol/L   Potassium 3.8 3.5 - 5.1 mmol/L   Chloride 106 101 - 111 mmol/L   CO2 23 22 - 32 mmol/L   Glucose, Bld 95 65 - 99 mg/dL   BUN 21 (H) 6 - 20 mg/dL   Creatinine, Ser 0.80 0.44 - 1.00 mg/dL   Calcium 9.2 8.9 - 10.3 mg/dL   GFR calc non Af Amer >60 >60 mL/min   GFR calc Af Amer >60 >60 mL/min   Anion gap 9 5 - 15  Protime-INR  Result Value Ref Range   Prothrombin Time 19.9 (H) 11.4 - 15.2 seconds   INR 1.67    EKG  EKG Interpretation None       Radiology No results found.  Procedures Procedures (including critical care time)  Medications Ordered in ED Medications - No data to display   Initial Impression / Assessment and Plan / ED Course  I have reviewed the triage vital signs and the nursing notes.  Pertinent labs & imaging results that were available during my care of the patient were reviewed by me and considered in my medical decision making (see  chart for details).  ua sent.   Reviewed nursing notes and prior charts for additional history.   Pt took acetaminophen this am.   Possible uti on labs.  Keflex po.    Po fluids.  Recheck abd soft nt.      Final Clinical Impressions(s) / ED Diagnoses   Final diagnoses:  None    New Prescriptions New Prescriptions   No medications on file     Lajean Saver, MD 11/19/16 1233

## 2016-11-20 LAB — URINE CULTURE

## 2016-11-27 DIAGNOSIS — R42 Dizziness and giddiness: Secondary | ICD-10-CM | POA: Diagnosis not present

## 2016-11-27 DIAGNOSIS — R1310 Dysphagia, unspecified: Secondary | ICD-10-CM | POA: Diagnosis not present

## 2016-11-27 DIAGNOSIS — N390 Urinary tract infection, site not specified: Secondary | ICD-10-CM | POA: Diagnosis not present

## 2016-12-08 NOTE — Progress Notes (Signed)
Cardiology Office Note    Date:  12/09/2016   ID:  Ana [name], DOB 05/06/1932, MRN [id number]  PCP:  London Pepper, MD  Cardiologist:  Fransico Him, MD   Chief Complaint  Patient presents with  . Atrial Fibrillation  . Congestive Heart Failure  . Hypertension    History of Present Illness:  Ana [name] is a 83 y.o. female with a hx of permanent atrial flutter, chronic diastolic CHF, pulmonary HTN, minimal mitral stenosis (echo 09/2012), HTN, HL and anemia of chronic disease. She has been kept on coumadin instead of NOAC due to valvular heart dsz. She also has a history of ASCAD with  multivessel CAD s/p PCI with BMS to the LAD and CFX and subsequent staged PCI with BMS to the RCA.  She is here today for followup and is doing well.  She is getting over a UTI and cold but is feeling better.  She walks with a walker. She denies any chest pain or pressure, shortness of breath, DOE, PND, orthopnea,  palpitations, syncope or significant pedal edema.  She has some problems with dizziness when going from laying to standing and then from standing to laying down with her head on her pillow that sounds like vertigo.      Past Medical History:  Diagnosis Date  . Anemia of chronic disease   . Atrial flutter ([place])   . Chronic diastolic CHF (congestive heart failure) ([place])   . Diabetes mellitus without complication ([place])   . Dizziness 02/13/2015  . Hyperlipidemia   . Hypertension   . Hypothyroidism   . MI, old 07/18/2014  . Mitral stenosis   . Pulmonary HTN ([place])    no evidence on last echo 2015  . Vitamin D deficiency disease     Past Surgical History:  Procedure Laterality Date  . CATARACT EXTRACTION    . LEFT HEART CATHETERIZATION WITH CORONARY ANGIOGRAM N/A 01/17/2014   Procedure: LEFT HEART CATHETERIZATION WITH CORONARY ANGIOGRAM;  Surgeon: Troy Sine, MD;  Location: [facility] CATH LAB;  Service: Cardiovascular;  Laterality: N/A;  . PERCUTANEOUS CORONARY STENT INTERVENTION  (PCI-S)  01/17/2014   Procedure: PERCUTANEOUS CORONARY STENT INTERVENTION (PCI-S);  Surgeon: Troy Sine, MD;  Location: [facility] CATH LAB;  Service: Cardiovascular;;  . PERCUTANEOUS CORONARY STENT INTERVENTION (PCI-S) N/A 01/19/2014   Procedure: PERCUTANEOUS CORONARY STENT INTERVENTION (PCI-S);  Surgeon: Leonie Man, MD;  Location: [facility] CATH LAB;  Service: Cardiovascular;  Laterality: N/A;    Current Medications: Current Meds  Medication Sig  . acetaminophen (TYLENOL) 500 MG tablet Take 500 mg by mouth every 8 (eight) hours as needed for mild pain, moderate pain, fever or headache.  Marland Kitchen amLODipine (NORVASC) 5 MG tablet TAKE ONE TABLET BY MOUTH ONCE DAILY.  Marland Kitchen aspirin EC 81 MG tablet Take 81 mg by mouth daily.  Marland Kitchen atorvastatin (LIPITOR) 10 MG tablet Take 1 tablet (10 mg total) by mouth daily.  . cephALEXin (KEFLEX) 500 MG capsule Take 1 capsule (500 mg total) by mouth 3 (three) times daily.  . Cholecalciferol (VITAMIN D3) 2000 UNITS TABS Take 2,000 Units by mouth daily.   . Coenzyme Q10 200 MG capsule Take 200 mg by mouth daily.  Marland Kitchen levothyroxine (SYNTHROID, LEVOTHROID) 88 MCG tablet Take 88 mcg by mouth daily before breakfast.  . losartan (COZAAR) 100 MG tablet TAKE ONE TABLET BY MOUTH ONCE DAILY  . metFORMIN (GLUCOPHAGE) 1000 MG tablet Take 1 tablet (1,000 mg total) by mouth 2 (two) times daily with a  meal.  . Omega-3 Krill Oil 500 MG CAPS Take 1 capsule by mouth daily.   Marland Kitchen warfarin (COUMADIN) 2.5 MG tablet TAKE AS DIRECTED BY  COUMADIN  CLINIC  . warfarin (COUMADIN) 2.5 MG tablet Take 2.5 mg by mouth See admin instructions. Takes 2.5mg  on Monday, Wednesday, Friday, and Saturday; then 1.25mg  on Sunday, Tuesday, and Thursday    Allergies:   Welchol [colesevelam hcl]; Zetia [ezetimibe]; Crestor [rosuvastatin]; and Zocor [simvastatin]   Social History   Social History  . Marital status: Married    Spouse name: N/A  . Number of children: N/A  . Years of education: N/A   Social History Main  Topics  . Smoking status: Never Smoker  . Smokeless tobacco: Never Used  . Alcohol use No  . Drug use: No  . Sexual activity: Not Asked   Other Topics Concern  . None   Social History Narrative  . None     Family History:  The patient's family history includes Cancer in her mother; Cirrhosis in her father; Diabetes in her brother; Heart attack in her sister; Heart disease in her sister.   ROS:   Please see the history of present illness.    ROS All other systems reviewed and are negative.  No flowsheet data found.     PHYSICAL EXAM:   VS:  BP (!) 110/48   Pulse 79   Ht 5\' 2"  (1.575 m)   Wt 114 lb (51.7 kg)   BMI 20.85 kg/m    GEN: Well nourished, well developed, in no acute distress  HEENT: normal  Neck: no JVD, carotid bruits, or masses Cardiac: RRR; no murmurs, rubs, or gallops,no edema.  Intact distal pulses bilaterally.  Respiratory:  clear to auscultation bilaterally, normal work of breathing GI: soft, nontender, nondistended, + BS MS: no deformity or atrophy  Skin: warm and dry, no rash Neuro:  Alert and Oriented x 3, Strength and sensation are intact Psych: euthymic mood, full affect  Wt Readings from Last 3 Encounters:  12/09/16 114 lb (51.7 kg)  11/19/16 120 lb (54.4 kg)  06/02/16 122 lb (55.3 kg)      Studies/Labs Reviewed:   EKG:  EKG is ordered today.  The ekg ordered today demonstrates atrial flutter with CVR at 79bpm with no ST changes  Recent Labs: 06/02/2016: ALT 8 11/19/2016: BUN 21; Creatinine, Ser 0.80; Hemoglobin 11.1; Platelets 216; Potassium 3.8; Sodium 138   Lipid Panel    Component Value Date/Time   CHOL 146 06/02/2016 1101   TRIG 72 06/02/2016 1101   HDL 45 (L) 06/02/2016 1101   CHOLHDL 3.2 06/02/2016 1101   VLDL 14 06/02/2016 1101   LDLCALC 87 06/02/2016 1101    Additional studies/ records that were reviewed today include:  none    ASSESSMENT:    1. Persistent atrial fibrillation ([place])   2. Essential hypertension     3. Chronic diastolic CHF (congestive heart failure) ([place])   4. Atherosclerosis of native coronary artery of native heart without angina pectoris   5. Pure hypercholesterolemia   6. Cough in adult patient      PLAN:  In order of problems listed above:  1. Permanent atrial flutter - she is rate controlled.  She will continue warfarin. Her INR is followed in our coumadin clinic.  2. HTN - BP controlled on exam today.  She will continue amlodipine 5mg  daily and  Losartan 50mg  daily.  3. Chronic diastolic CHF - she appears euvolemic on exam and weight  is stable.  She has not required diuretics.   4.   ASCAD multivessel CAD with  PCI (BMS) to the LAD and CFX and subsequent staged PCI with BMS to the RCA with remote MI - she has not had any anginal CP. She will continue on Atorvastatin 20mg  daily and ASA 81mg  daily.  5.   Hyperlipidemia - LDL goal < 70.  She will continue on atorvastatin.  Her last LDL was 87 in 05/2016.  I will check FLP and ALT.   6.   Cough with recent URI symptoms - her lungs sound very junky.  I will get her in to see her PCP today.  Medication Adjustments/Labs and Tests Ordered: Current medicines are reviewed at length with the patient today.  Concerns regarding medicines are outlined above.  Medication changes, Labs and Tests ordered today are listed in the Patient Instructions below.  There are no Patient Instructions on file for this visit.   Signed, Fransico Him, MD  12/09/2016 10:35 AM    [facility] [address], [place], [place]  [address] Phone: [phone]; Fax: [phone]

## 2016-12-09 ENCOUNTER — Encounter: Payer: Self-pay | Admitting: Cardiology

## 2016-12-09 ENCOUNTER — Ambulatory Visit (INDEPENDENT_AMBULATORY_CARE_PROVIDER_SITE_OTHER): Payer: Medicare Other | Admitting: *Deleted

## 2016-12-09 ENCOUNTER — Ambulatory Visit (INDEPENDENT_AMBULATORY_CARE_PROVIDER_SITE_OTHER): Payer: Medicare Other | Admitting: Cardiology

## 2016-12-09 VITALS — BP 110/48 | HR 79 | Ht 62.0 in | Wt 114.0 lb

## 2016-12-09 DIAGNOSIS — I481 Persistent atrial fibrillation: Secondary | ICD-10-CM

## 2016-12-09 DIAGNOSIS — Z7901 Long term (current) use of anticoagulants: Secondary | ICD-10-CM | POA: Diagnosis not present

## 2016-12-09 DIAGNOSIS — I10 Essential (primary) hypertension: Secondary | ICD-10-CM | POA: Diagnosis not present

## 2016-12-09 DIAGNOSIS — E7800 Pure hypercholesterolemia, unspecified: Secondary | ICD-10-CM

## 2016-12-09 DIAGNOSIS — R05 Cough: Secondary | ICD-10-CM

## 2016-12-09 DIAGNOSIS — R059 Cough, unspecified: Secondary | ICD-10-CM | POA: Insufficient documentation

## 2016-12-09 DIAGNOSIS — I2510 Atherosclerotic heart disease of native coronary artery without angina pectoris: Secondary | ICD-10-CM

## 2016-12-09 DIAGNOSIS — N390 Urinary tract infection, site not specified: Secondary | ICD-10-CM | POA: Diagnosis not present

## 2016-12-09 DIAGNOSIS — I480 Paroxysmal atrial fibrillation: Secondary | ICD-10-CM | POA: Diagnosis not present

## 2016-12-09 DIAGNOSIS — I5032 Chronic diastolic (congestive) heart failure: Secondary | ICD-10-CM

## 2016-12-09 DIAGNOSIS — I4819 Other persistent atrial fibrillation: Secondary | ICD-10-CM

## 2016-12-09 DIAGNOSIS — R300 Dysuria: Secondary | ICD-10-CM | POA: Diagnosis not present

## 2016-12-09 LAB — POCT INR: INR: 3.1

## 2016-12-09 NOTE — Patient Instructions (Addendum)
Medication Instructions:  Your physician recommends that you continue on your current medications as directed. Please refer to the Current Medication list given to you today.   Labwork: Your physician recommends that you return for FASTING lab work as soon as you can.  Testing/Procedures: None  Follow-Up: Your physician recommends that you schedule a follow-up appointment with your PCP TODAY for evaluation of cough with URI symptoms.  Your physician wants you to follow-up in: 6 months with Dr. Radford Pax. You will receive a reminder letter in the mail two months in advance. If you don't receive a letter, please call our office to schedule the follow-up appointment.   Any Other Special Instructions Will Be Listed Below (If Applicable).     If you need a refill on your cardiac medications before your next appointment, please call your pharmacy.

## 2016-12-19 ENCOUNTER — Other Ambulatory Visit: Payer: Medicare Other | Admitting: *Deleted

## 2016-12-19 ENCOUNTER — Ambulatory Visit (INDEPENDENT_AMBULATORY_CARE_PROVIDER_SITE_OTHER): Payer: Medicare Other

## 2016-12-19 DIAGNOSIS — I480 Paroxysmal atrial fibrillation: Secondary | ICD-10-CM | POA: Diagnosis not present

## 2016-12-19 DIAGNOSIS — I481 Persistent atrial fibrillation: Secondary | ICD-10-CM

## 2016-12-19 DIAGNOSIS — Z7901 Long term (current) use of anticoagulants: Secondary | ICD-10-CM

## 2016-12-19 DIAGNOSIS — I4819 Other persistent atrial fibrillation: Secondary | ICD-10-CM

## 2016-12-19 DIAGNOSIS — I2510 Atherosclerotic heart disease of native coronary artery without angina pectoris: Secondary | ICD-10-CM | POA: Diagnosis not present

## 2016-12-19 DIAGNOSIS — E7800 Pure hypercholesterolemia, unspecified: Secondary | ICD-10-CM | POA: Diagnosis not present

## 2016-12-19 LAB — HEPATIC FUNCTION PANEL
ALT: 10 IU/L (ref 0–32)
AST: 15 IU/L (ref 0–40)
Albumin: 3.9 g/dL (ref 3.5–4.7)
Alkaline Phosphatase: 43 IU/L (ref 39–117)
Bilirubin Total: 0.5 mg/dL (ref 0.0–1.2)
Bilirubin, Direct: 0.18 mg/dL (ref 0.00–0.40)
TOTAL PROTEIN: 6.6 g/dL (ref 6.0–8.5)

## 2016-12-19 LAB — LIPID PANEL
CHOLESTEROL TOTAL: 135 mg/dL (ref 100–199)
Chol/HDL Ratio: 3 ratio (ref 0.0–4.4)
HDL: 45 mg/dL (ref >39)
LDL CALC: 72 mg/dL (ref 0–99)
TRIGLYCERIDES: 90 mg/dL (ref 0–149)
VLDL Cholesterol Cal: 18 mg/dL (ref 5–40)

## 2016-12-19 LAB — BASIC METABOLIC PANEL
BUN / CREAT RATIO: 27 (ref 12–28)
BUN: 24 mg/dL (ref 8–27)
CO2: 19 mmol/L — ABNORMAL LOW (ref 20–29)
CREATININE: 0.88 mg/dL (ref 0.57–1.00)
Calcium: 9.2 mg/dL (ref 8.7–10.3)
Chloride: 99 mmol/L (ref 96–106)
GFR calc Af Amer: 71 mL/min/{1.73_m2} (ref >59)
GFR calc non Af Amer: 61 mL/min/{1.73_m2} (ref >59)
GLUCOSE: 77 mg/dL (ref 65–99)
Potassium: 4.7 mmol/L (ref 3.5–5.2)
SODIUM: 132 mmol/L — AB (ref 134–144)

## 2016-12-19 LAB — POCT INR: INR: 3.6

## 2016-12-22 ENCOUNTER — Telehealth: Payer: Self-pay

## 2016-12-22 NOTE — Telephone Encounter (Signed)
Patient is aware of stable results. We are both  agreeable to continuing current meds

## 2016-12-31 ENCOUNTER — Ambulatory Visit (INDEPENDENT_AMBULATORY_CARE_PROVIDER_SITE_OTHER): Payer: Medicare Other | Admitting: Pharmacist

## 2016-12-31 DIAGNOSIS — I4819 Other persistent atrial fibrillation: Secondary | ICD-10-CM

## 2016-12-31 DIAGNOSIS — I481 Persistent atrial fibrillation: Secondary | ICD-10-CM

## 2016-12-31 DIAGNOSIS — Z7901 Long term (current) use of anticoagulants: Secondary | ICD-10-CM

## 2016-12-31 DIAGNOSIS — I480 Paroxysmal atrial fibrillation: Secondary | ICD-10-CM | POA: Diagnosis not present

## 2016-12-31 DIAGNOSIS — E119 Type 2 diabetes mellitus without complications: Secondary | ICD-10-CM | POA: Diagnosis not present

## 2016-12-31 DIAGNOSIS — I2510 Atherosclerotic heart disease of native coronary artery without angina pectoris: Secondary | ICD-10-CM | POA: Diagnosis not present

## 2016-12-31 LAB — POCT INR: INR: 2.9

## 2017-01-08 DIAGNOSIS — R102 Pelvic and perineal pain: Secondary | ICD-10-CM | POA: Diagnosis not present

## 2017-01-08 DIAGNOSIS — R300 Dysuria: Secondary | ICD-10-CM | POA: Diagnosis not present

## 2017-01-08 DIAGNOSIS — N760 Acute vaginitis: Secondary | ICD-10-CM | POA: Diagnosis not present

## 2017-01-09 ENCOUNTER — Other Ambulatory Visit: Payer: Self-pay | Admitting: Family Medicine

## 2017-01-09 DIAGNOSIS — R5381 Other malaise: Secondary | ICD-10-CM

## 2017-01-13 ENCOUNTER — Other Ambulatory Visit: Payer: Self-pay | Admitting: Family Medicine

## 2017-01-13 DIAGNOSIS — R102 Pelvic and perineal pain: Secondary | ICD-10-CM

## 2017-01-16 ENCOUNTER — Ambulatory Visit (INDEPENDENT_AMBULATORY_CARE_PROVIDER_SITE_OTHER): Payer: Medicare Other | Admitting: *Deleted

## 2017-01-16 DIAGNOSIS — R300 Dysuria: Secondary | ICD-10-CM | POA: Diagnosis not present

## 2017-01-16 DIAGNOSIS — Z7901 Long term (current) use of anticoagulants: Secondary | ICD-10-CM

## 2017-01-16 DIAGNOSIS — I480 Paroxysmal atrial fibrillation: Secondary | ICD-10-CM | POA: Diagnosis not present

## 2017-01-16 DIAGNOSIS — I481 Persistent atrial fibrillation: Secondary | ICD-10-CM | POA: Diagnosis not present

## 2017-01-16 DIAGNOSIS — N390 Urinary tract infection, site not specified: Secondary | ICD-10-CM | POA: Diagnosis not present

## 2017-01-16 DIAGNOSIS — I2510 Atherosclerotic heart disease of native coronary artery without angina pectoris: Secondary | ICD-10-CM

## 2017-01-16 DIAGNOSIS — I4819 Other persistent atrial fibrillation: Secondary | ICD-10-CM

## 2017-01-16 LAB — POCT INR: INR: 3.2

## 2017-01-21 ENCOUNTER — Ambulatory Visit
Admission: RE | Admit: 2017-01-21 | Discharge: 2017-01-21 | Disposition: A | Discharge status: Home / Self Care | Payer: Medicare Other | Source: Ambulatory Visit | Attending: Family Medicine | Admitting: Family Medicine

## 2017-01-21 DIAGNOSIS — R102 Pelvic and perineal pain: Secondary | ICD-10-CM | POA: Diagnosis not present

## 2017-01-22 ENCOUNTER — Other Ambulatory Visit: Payer: Self-pay | Admitting: Family Medicine

## 2017-01-22 DIAGNOSIS — N9489 Other specified conditions associated with female genital organs and menstrual cycle: Secondary | ICD-10-CM

## 2017-01-27 ENCOUNTER — Ambulatory Visit
Admission: RE | Admit: 2017-01-27 | Discharge: 2017-01-27 | Disposition: A | Discharge status: Home / Self Care | Payer: Medicare Other | Source: Ambulatory Visit | Attending: Family Medicine | Admitting: Family Medicine

## 2017-01-27 DIAGNOSIS — R102 Pelvic and perineal pain: Secondary | ICD-10-CM | POA: Diagnosis not present

## 2017-01-27 DIAGNOSIS — N9489 Other specified conditions associated with female genital organs and menstrual cycle: Secondary | ICD-10-CM

## 2017-01-30 ENCOUNTER — Ambulatory Visit (INDEPENDENT_AMBULATORY_CARE_PROVIDER_SITE_OTHER): Payer: Medicare Other | Admitting: *Deleted

## 2017-01-30 DIAGNOSIS — I480 Paroxysmal atrial fibrillation: Secondary | ICD-10-CM

## 2017-01-30 DIAGNOSIS — Z7901 Long term (current) use of anticoagulants: Secondary | ICD-10-CM | POA: Diagnosis not present

## 2017-01-30 DIAGNOSIS — I2510 Atherosclerotic heart disease of native coronary artery without angina pectoris: Secondary | ICD-10-CM

## 2017-01-30 DIAGNOSIS — I4819 Other persistent atrial fibrillation: Secondary | ICD-10-CM

## 2017-01-30 DIAGNOSIS — I481 Persistent atrial fibrillation: Secondary | ICD-10-CM | POA: Diagnosis not present

## 2017-01-30 LAB — POCT INR: INR: 3

## 2017-02-20 ENCOUNTER — Ambulatory Visit (INDEPENDENT_AMBULATORY_CARE_PROVIDER_SITE_OTHER): Payer: Medicare Other

## 2017-02-20 DIAGNOSIS — I2510 Atherosclerotic heart disease of native coronary artery without angina pectoris: Secondary | ICD-10-CM

## 2017-02-20 DIAGNOSIS — I480 Paroxysmal atrial fibrillation: Secondary | ICD-10-CM | POA: Diagnosis not present

## 2017-02-20 DIAGNOSIS — I481 Persistent atrial fibrillation: Secondary | ICD-10-CM | POA: Diagnosis not present

## 2017-02-20 DIAGNOSIS — Z7901 Long term (current) use of anticoagulants: Secondary | ICD-10-CM | POA: Diagnosis not present

## 2017-02-20 DIAGNOSIS — I4819 Other persistent atrial fibrillation: Secondary | ICD-10-CM

## 2017-02-20 LAB — POCT INR: INR: 1.4

## 2017-02-25 DIAGNOSIS — R1909 Other intra-abdominal and pelvic swelling, mass and lump: Secondary | ICD-10-CM | POA: Diagnosis not present

## 2017-03-03 ENCOUNTER — Telehealth: Payer: Self-pay | Admitting: *Deleted

## 2017-03-03 NOTE — Telephone Encounter (Signed)
Contacted Caryl Pina at [facility] ob/gyn and gave the new patient appt for September 19th at 12:15. She will contact the patient

## 2017-03-04 ENCOUNTER — Ambulatory Visit (INDEPENDENT_AMBULATORY_CARE_PROVIDER_SITE_OTHER): Payer: Medicare Other | Admitting: *Deleted

## 2017-03-04 DIAGNOSIS — E119 Type 2 diabetes mellitus without complications: Secondary | ICD-10-CM | POA: Diagnosis not present

## 2017-03-04 DIAGNOSIS — I480 Paroxysmal atrial fibrillation: Secondary | ICD-10-CM | POA: Diagnosis not present

## 2017-03-04 DIAGNOSIS — E039 Hypothyroidism, unspecified: Secondary | ICD-10-CM | POA: Diagnosis not present

## 2017-03-04 DIAGNOSIS — R300 Dysuria: Secondary | ICD-10-CM | POA: Diagnosis not present

## 2017-03-04 DIAGNOSIS — E785 Hyperlipidemia, unspecified: Secondary | ICD-10-CM | POA: Diagnosis not present

## 2017-03-04 DIAGNOSIS — E538 Deficiency of other specified B group vitamins: Secondary | ICD-10-CM | POA: Diagnosis not present

## 2017-03-04 DIAGNOSIS — Z0000 Encounter for general adult medical examination without abnormal findings: Secondary | ICD-10-CM | POA: Diagnosis not present

## 2017-03-04 DIAGNOSIS — Z23 Encounter for immunization: Secondary | ICD-10-CM | POA: Diagnosis not present

## 2017-03-04 DIAGNOSIS — Z7901 Long term (current) use of anticoagulants: Secondary | ICD-10-CM | POA: Diagnosis not present

## 2017-03-04 DIAGNOSIS — N949 Unspecified condition associated with female genital organs and menstrual cycle: Secondary | ICD-10-CM | POA: Diagnosis not present

## 2017-03-04 DIAGNOSIS — D649 Anemia, unspecified: Secondary | ICD-10-CM | POA: Diagnosis not present

## 2017-03-04 DIAGNOSIS — Z5181 Encounter for therapeutic drug level monitoring: Secondary | ICD-10-CM

## 2017-03-04 DIAGNOSIS — I4819 Other persistent atrial fibrillation: Secondary | ICD-10-CM

## 2017-03-04 DIAGNOSIS — I10 Essential (primary) hypertension: Secondary | ICD-10-CM | POA: Diagnosis not present

## 2017-03-04 LAB — POCT INR: INR: 2.1

## 2017-03-05 ENCOUNTER — Telehealth: Payer: Self-pay | Admitting: *Deleted

## 2017-03-05 NOTE — Telephone Encounter (Signed)
Per patient's son, appt requested to be moved to an earlier time on September 19th. Appt moved and son notified.

## 2017-03-11 ENCOUNTER — Ambulatory Visit: Payer: Medicare Other | Attending: Gynecologic Oncology | Admitting: Gynecologic Oncology

## 2017-03-11 ENCOUNTER — Encounter: Payer: Self-pay | Admitting: Gynecologic Oncology

## 2017-03-11 VITALS — BP 128/56 | HR 83 | Temp 97.6°F | Resp 20 | Ht 62.0 in | Wt 119.8 lb

## 2017-03-11 DIAGNOSIS — E119 Type 2 diabetes mellitus without complications: Secondary | ICD-10-CM | POA: Diagnosis not present

## 2017-03-11 DIAGNOSIS — E785 Hyperlipidemia, unspecified: Secondary | ICD-10-CM | POA: Insufficient documentation

## 2017-03-11 DIAGNOSIS — N949 Unspecified condition associated with female genital organs and menstrual cycle: Secondary | ICD-10-CM | POA: Insufficient documentation

## 2017-03-11 DIAGNOSIS — Z8249 Family history of ischemic heart disease and other diseases of the circulatory system: Secondary | ICD-10-CM | POA: Diagnosis not present

## 2017-03-11 DIAGNOSIS — Z7982 Long term (current) use of aspirin: Secondary | ICD-10-CM | POA: Diagnosis not present

## 2017-03-11 DIAGNOSIS — I4892 Unspecified atrial flutter: Secondary | ICD-10-CM | POA: Insufficient documentation

## 2017-03-11 DIAGNOSIS — D638 Anemia in other chronic diseases classified elsewhere: Secondary | ICD-10-CM | POA: Insufficient documentation

## 2017-03-11 DIAGNOSIS — R300 Dysuria: Secondary | ICD-10-CM | POA: Diagnosis not present

## 2017-03-11 DIAGNOSIS — Z7901 Long term (current) use of anticoagulants: Secondary | ICD-10-CM

## 2017-03-11 DIAGNOSIS — I5032 Chronic diastolic (congestive) heart failure: Secondary | ICD-10-CM | POA: Diagnosis not present

## 2017-03-11 DIAGNOSIS — E039 Hypothyroidism, unspecified: Secondary | ICD-10-CM | POA: Insufficient documentation

## 2017-03-11 DIAGNOSIS — Z79899 Other long term (current) drug therapy: Secondary | ICD-10-CM | POA: Diagnosis not present

## 2017-03-11 DIAGNOSIS — Z955 Presence of coronary angioplasty implant and graft: Secondary | ICD-10-CM | POA: Insufficient documentation

## 2017-03-11 DIAGNOSIS — I2510 Atherosclerotic heart disease of native coronary artery without angina pectoris: Secondary | ICD-10-CM | POA: Insufficient documentation

## 2017-03-11 DIAGNOSIS — I252 Old myocardial infarction: Secondary | ICD-10-CM | POA: Diagnosis not present

## 2017-03-11 DIAGNOSIS — Z809 Family history of malignant neoplasm, unspecified: Secondary | ICD-10-CM | POA: Diagnosis not present

## 2017-03-11 DIAGNOSIS — Z9889 Other specified postprocedural states: Secondary | ICD-10-CM | POA: Diagnosis not present

## 2017-03-11 DIAGNOSIS — Z833 Family history of diabetes mellitus: Secondary | ICD-10-CM | POA: Diagnosis not present

## 2017-03-11 DIAGNOSIS — I5189 Other ill-defined heart diseases: Secondary | ICD-10-CM | POA: Diagnosis not present

## 2017-03-11 DIAGNOSIS — I110 Hypertensive heart disease with heart failure: Secondary | ICD-10-CM | POA: Insufficient documentation

## 2017-03-11 DIAGNOSIS — E559 Vitamin D deficiency, unspecified: Secondary | ICD-10-CM | POA: Insufficient documentation

## 2017-03-11 DIAGNOSIS — Z888 Allergy status to other drugs, medicaments and biological substances status: Secondary | ICD-10-CM | POA: Diagnosis not present

## 2017-03-11 DIAGNOSIS — R1900 Intra-abdominal and pelvic swelling, mass and lump, unspecified site: Secondary | ICD-10-CM | POA: Diagnosis not present

## 2017-03-11 DIAGNOSIS — Z8489 Family history of other specified conditions: Secondary | ICD-10-CM | POA: Insufficient documentation

## 2017-03-11 DIAGNOSIS — R102 Pelvic and perineal pain: Secondary | ICD-10-CM | POA: Insufficient documentation

## 2017-03-11 DIAGNOSIS — I272 Pulmonary hypertension, unspecified: Secondary | ICD-10-CM | POA: Insufficient documentation

## 2017-03-11 DIAGNOSIS — N9489 Other specified conditions associated with female genital organs and menstrual cycle: Secondary | ICD-10-CM

## 2017-03-11 DIAGNOSIS — Z7984 Long term (current) use of oral hypoglycemic drugs: Secondary | ICD-10-CM | POA: Diagnosis not present

## 2017-03-11 NOTE — Patient Instructions (Addendum)
Preparing for your Surgery  Plan for surgery on                   with Dr. Everitt Amber at [facility] will be scheduled for a robotic bilateral salpingo-oophorectomy.  Pre-operative Testing -You will receive a phone call from presurgical testing at [facility] to arrange for a pre-operative testing appointment before your surgery.  This appointment normally occurs one to two weeks before your scheduled surgery.   -Bring your insurance card, copy of an advanced directive if applicable, medication list  -At that visit, you will be asked to sign a consent for a possible blood transfusion in case a transfusion becomes necessary during surgery.  The need for a blood transfusion is rare but having consent is a necessary part of your care.     -WE WILL LET YOU KNOW ABOUT DR TURNER'S RECOMMENDATIONS.  Day Before Surgery at [place] will be asked to take in a light diet the day before surgery.  Avoid carbonated beverages.  You will be advised to have nothing to eat or drink after midnight the evening before.    Eat a light diet the day before surgery.  Examples including soups, broths, toast, yogurt, mashed potatoes.  Things to avoid include carbonated beverages (fizzy beverages), raw fruits and raw vegetables, or beans.   If your bowels are filled with gas, your surgeon will have difficulty visualizing your pelvic organs which increases your surgical risks.  Your role in recovery Your role is to become active as soon as directed by your doctor, while still giving yourself time to heal.  Rest when you feel tired. You will be asked to do the following in order to speed your recovery:  - Cough and breathe deeply. This helps toclear and expand your lungs and can prevent pneumonia. You may be given a spirometer to practice deep breathing. A staff member will show you how to use the spirometer. - Do mild physical activity. Walking or moving your legs help your  circulation and body functions return to normal. A staff member will help you when you try to walk and will provide you with simple exercises. Do not try to get up or walk alone the first time. - Actively manage your pain. Managing your pain lets you move in comfort. We will ask you to rate your pain on a scale of zero to 10. It is your responsibility to tell your doctor or nurse where and how much you hurt so your pain can be treated.  Special Considerations -If you are diabetic, you may be placed on insulin after surgery to have closer control over your blood sugars to promote healing and recovery.  This does not mean that you will be discharged on insulin.  If applicable, your oral antidiabetics will be resumed when you are tolerating a solid diet.  -Your final pathology results from surgery should be available by the Friday after surgery and the results will be relayed to you when available.  -Dr. Lahoma Crocker is the Surgeon that assists your GYN Oncologist with surgery.  The next day after your surgery you will either see your GYN Oncologist or Dr. Lahoma Crocker.   Blood Transfusion Information WHAT IS A BLOOD TRANSFUSION? A transfusion is the replacement of blood or some of its parts. Blood is made up of multiple cells which provide different functions.  Red blood cells carry oxygen and are used for blood loss replacement.  White  blood cells fight against infection.  Platelets control bleeding.  Plasma helps clot blood.  Other blood products are available for specialized needs, such as hemophilia or other clotting disorders. BEFORE THE TRANSFUSION  Who gives blood for transfusions?   You may be able to donate blood to be used at a later date on yourself (autologous donation).  Relatives can be asked to donate blood. This is generally not any safer than if you have received blood from a stranger. The same precautions are taken to ensure safety when a relative's blood is  donated.  Healthy volunteers who are fully evaluated to make sure their blood is safe. This is blood bank blood. Transfusion therapy is the safest it has ever been in the practice of medicine. Before blood is taken from a donor, a complete history is taken to make sure that person has no history of diseases nor engages in risky social behavior (examples are intravenous drug use or sexual activity with multiple partners). The donor's travel history is screened to minimize risk of transmitting infections, such as malaria. The donated blood is tested for signs of infectious diseases, such as HIV and hepatitis. The blood is then tested to be sure it is compatible with you in order to minimize the chance of a transfusion reaction. If you or a relative donates blood, this is often done in anticipation of surgery and is not appropriate for emergency situations. It takes many days to process the donated blood. RISKS AND COMPLICATIONS Although transfusion therapy is very safe and saves many lives, the main dangers of transfusion include:   Getting an infectious disease.  Developing a transfusion reaction. This is an allergic reaction to something in the blood you were given. Every precaution is taken to prevent this. The decision to have a blood transfusion has been considered carefully by your caregiver before blood is given. Blood is not given unless the benefits outweigh the risks.   Bilateral Salpingo-Oophorectomy Bilateral salpingo-oophorectomy is the surgical removal of both fallopian tubes and both ovaries. The ovaries are reproductive organs that produce eggs in women. The fallopian tubes allow eggs to move from the ovaries to the uterus. You may need this procedure if you:  Have had your uterus removed. This procedure is usually done after the uterus is removed.  Have cancer of the fallopian tubes or ovaries.  Have a high risk of cancer of the fallopian tubes or ovaries.  There are three  different techniques that can be used for this procedure:  Open. One large incision will be made in your abdomen.  Laparoscopic. A thin, lighted tube with a small camera on the end (laparoscope) will be used to help perform the procedure. The laparoscope will allow your surgeon to make several small incisions in the abdomen instead of one large incision.  Robot-assisted. A computer will be used to control surgical instruments that are attached to robotic arms. A laparoscope may also be used with this technique.  As a result of this procedure, you will become sterile (unable to become pregnant), and you will go into menopause (no longer able to have menstrual periods). You may develop symptoms of menopause such as hot flashes, night sweats, and mood changes. Your sex drive may also be affected. Tell a health care provider about:  Any allergies you have.  All medicines you are taking, including vitamins, herbs, eye drops, creams, and over-the-counter medicines.  Any problems you or family members have had with anesthetic medicines.  Any blood disorders  you have.  Any surgeries you have had.  Any medical conditions you have.  Whether you are pregnant or may be pregnant. What are the risks? Generally, this is a safe procedure. However, problems may occur, including:  Infection.  Bleeding.  Allergic reactions to medicines.  Damage to other structures or organs.  Blood clots in the legs or lungs.  What happens before the procedure? Staying hydrated Follow instructions from your health care provider about hydration, which may include:  Up to 2 hours before the procedure - you may continue to drink clear liquids, such as water, clear fruit juice, black coffee, and plain tea.  Eating and drinking restrictions Follow instructions from your health care provider about eating and drinking, which may include:  8 hours before the procedure - stop eating heavy meals or foods such as  meat, fried foods, or fatty foods.  6 hours before the procedure - stop eating light meals or foods, such as toast or cereal.  6 hours before the procedure - stop drinking milk or drinks that contain milk.  2 hours before the procedure - stop drinking clear liquids.  Medicines  Ask your health care provider about: ? Changing or stopping your regular medicines. This is especially important if you are taking diabetes medicines or blood thinners. ? Taking medicines such as aspirin and ibuprofen. These medicines can thin your blood. Do not take these medicines before your procedure if your health care provider instructs you not to.  You may be given antibiotic medicine to help prevent infection. General instructions  Do not smoke for at least 2 weeks before your procedure or as told by your health care provider.  You may have an exam or testing.  You may have a blood or urine sample taken.  Ask your health care provider how your surgical site will be marked or identified.  Plan to have someone take you home from the hospital.  If you will be going home right after the procedure, plan to have someone with you for 24 hours. What happens during the procedure?  To reduce your risk of infection: ? Your health care team will wash or sanitize their hands. ? Your skin will be washed with soap. ? Hair may be removed from the surgical area.  An IV tube will be inserted into one of your veins.  You will be given one or more of the following: ? A medicine to help you relax (sedative). ? A medicine to make you fall asleep (general anesthetic).  A thin tube (catheter) will be inserted through your urethra and into your bladder. The catheter drains urine during your procedure.  Depending on the type of surgery you are having, your surgeon will do one of the following: ? Make one incision in your abdomen (open surgery). ? Make two small incisions in your abdomen (laparoscopic surgery). The  laparoscope will be passed through one incision, and surgical instruments will be passed through the other. ? Make several small incisions in your abdomen (robot-assisted surgery). A laparoscope and other surgical instruments may be passed through the incisions.  Your fallopian tubes and ovaries will be cut away from the uterus and removed.  Your blood vessels will be clamped and tied to prevent too much bleeding.  The incision(s) in your abdomen will be closed with stitches (sutures) or staples.  A bandage (dressing) may be placed over your incision(s). The procedure may vary among health care providers and hospitals. What happens after the procedure?  Your blood pressure, heart rate, breathing rate, and blood oxygen level will be monitored until the medicines you were given have worn off.  You may continue to receive fluids and medicines through an IV tube.  You may continue to have a catheter draining your urine.  You may have to wear compression stockings. These stockings help to prevent blood clots and reduce swelling in your legs.  You will be given pain medicine as needed.  Do not drive for 24 hours if you received a sedative. Summary  Bilateral salpingo-oophorectomy is a procedure to remove both fallopian tubes and both ovaries.  There are three different techniques that can be used for this procedure, including open, laparoscopic, and robotic. Talk with your health care provider about how your procedure will be done.  As a result of this procedure, you will become sterile and you will go into menopause.  Plan to have someone take you home from the hospital. This information is not intended to replace advice given to you by your health care provider. Make sure you discuss any questions you have with your health care provider. Document Released: 06/09/2005 Document Revised: 07/14/2016 Document Reviewed: 07/14/2016 Elsevier Interactive Patient Education  [employer].

## 2017-03-11 NOTE — Progress Notes (Signed)
Consult Note: Gyn-Onc  Ana [name] 83 y.o. female  CC:  Chief Complaint  Patient presents with  . Adnexal mass N94.9]    HPI: Patient is seen today at the request of Dr. Deatra Ina. Primary cardiologist Dr. Golden Hurter. Primary physician Dr. London Pepper.  BREANN LOSANO is a 83 y.o. female with a hx of permanent atrial flutter, chronic diastolic CHF, pulmonary HTN, minimal mitral stenosis (echo 09/2012), HTN, HL and anemia of chronic disease. She has been kept on coumadin instead of NOAC due to valvular heart dsz. She also has a history of ASCAD with  multivessel CAD s/p PCI with BMS to the LAD and CFX and subsequent staged PCI with BMS to the RCA.   She states that in May she began having episodes of abdominal pain. The first episode awoke early in the morning and she describes as a "attack". She called 911 and went to the emergency room via EMS. She states that she was diagnosed with a urinary tract infection which is common for her and she took antibody which really did not help. She discussed with her son who said that it sounded like kidney stones which she has suffered from. She had subsequently 2 other episodes similar and ultimately underwent an ultrasound as he felt that this was most likely consistent with kidney stones. Ultrasound revealed the uterus to be 6.7 x 4.8 x 2.8 cm with small calcifications. There were no large fibroids. The endometrial thickness was 3 mm but was normal. There was a 1.6 mm focal nodule most consistent with a benign polyp. However, there was an 8.5 x 4.4 x 4 cm complex cystic and solid adnexal mass. She was seen by Dr. Deatra Ina and had a CA-125 performed that was 28. She subsequently was referred to [place] for evaluation and management.  She has 2 different types of pelvic pain. She's has this pain that is like an attack and then she still has some suprapubic discomfort and which she describes as dysuria which feels like "boiling water". She is once again on antibiotics  that she does not recall the name. She denies any history of change in her bowels are she stay she's long history of constipation. She's not sure of its actually gotten any worse lately. She does walk with a walker. She denies any chest pain or shortness of breath. Her last colonoscopy was about 10 years ago. She has stopped having mammograms.  Review of Systems: Constitutional: Denies unintentional weight loss or weight gain. Skin: No rash Cardiovascular: No chest pain, shortness of breath, or edema. She is not going up stairs but she's able to do her grocery shopping with her walker.  Pulmonary: No cough Gastro Intestinal: Reporting intermittent lower abdominal soreness with episodes of severe abdominal pain.  No nausea, vomiting, +constipation. No bright red blood per rectum or change in bowel movement.  Genitourinary: No frequency, urgency, + dysuria. She stay she's incontinent all the time and wears pads. This is no change. Denies vaginal bleeding and discharge.  Musculoskeletal: No pain.  Neurologic: Uses a walker   Current Meds:  Outpatient Encounter Prescriptions as of 03/11/2017  Medication Sig  . acetaminophen (TYLENOL) 500 MG tablet Take 500 mg by mouth every 8 (eight) hours as needed for mild pain, moderate pain, fever or headache.  Marland Kitchen amLODipine (NORVASC) 5 MG tablet TAKE ONE TABLET BY MOUTH ONCE DAILY.  Marland Kitchen aspirin EC 81 MG tablet Take 81 mg by mouth daily.  . Cholecalciferol (VITAMIN D3) 2000  UNITS TABS Take 2,000 Units by mouth daily.   . Ciprofloxacin (CIPRO PO) Take 250 mg by mouth 2 (two) times daily.   . Coenzyme Q10 200 MG capsule Take 200 mg by mouth daily.  Marland Kitchen levothyroxine (SYNTHROID, LEVOTHROID) 88 MCG tablet Take 88 mcg by mouth daily before breakfast.  . losartan (COZAAR) 100 MG tablet TAKE ONE TABLET BY MOUTH ONCE DAILY  . metFORMIN (GLUCOPHAGE) 1000 MG tablet Take 1 tablet (1,000 mg total) by mouth 2 (two) times daily with a meal.  . Omega-3 Krill Oil 500 MG CAPS Take  1 capsule by mouth daily.   . polyethylene glycol (MIRALAX / GLYCOLAX) packet Take 17 g by mouth daily as needed.  . warfarin (COUMADIN) 2.5 MG tablet Take 2.5 mg by mouth See admin instructions. Takes 2.5mg  on Monday, Wednesday, Friday, and Saturday; then 1.25mg  on Sunday, Tuesday, and Thursday  . [DISCONTINUED] atorvastatin (LIPITOR) 10 MG tablet Take 1 tablet (10 mg total) by mouth daily.  . [DISCONTINUED] cephALEXin (KEFLEX) 500 MG capsule Take 1 capsule (500 mg total) by mouth 3 (three) times daily.  . [DISCONTINUED] warfarin (COUMADIN) 2.5 MG tablet TAKE AS DIRECTED BY  COUMADIN  CLINIC   No facility-administered encounter medications on file as of 03/11/2017.     Allergy:  Allergies  Allergen Reactions  . Welchol [Colesevelam Hcl] Nausea Only  . Zetia [Ezetimibe] Other (See Comments)    Edema   . Statins   . Crestor [Rosuvastatin] Other (See Comments)    Unknown  . Zocor [Simvastatin] Other (See Comments)    Muscle weakness    Social Hx:   Social History   Social History  . Marital status: Married    Spouse name: N/A  . Number of children: N/A  . Years of education: N/A   Occupational History  . Not on file.   Social History Main Topics  . Smoking status: Never Smoker  . Smokeless tobacco: Never Used  . Alcohol use No  . Drug use: No  . Sexual activity: Not on file   Other Topics Concern  . Not on file   Social History Narrative  . No narrative on file    Past Surgical Hx:  Past Surgical History:  Procedure Laterality Date  . CATARACT EXTRACTION    . LEFT HEART CATHETERIZATION WITH CORONARY ANGIOGRAM N/A 01/17/2014   Procedure: LEFT HEART CATHETERIZATION WITH CORONARY ANGIOGRAM;  Surgeon: Troy Sine, MD;  Location: [facility] CATH LAB;  Service: Cardiovascular;  Laterality: N/A;  . PERCUTANEOUS CORONARY STENT INTERVENTION (PCI-S)  01/17/2014   Procedure: PERCUTANEOUS CORONARY STENT INTERVENTION (PCI-S);  Surgeon: Troy Sine, MD;  Location: [facility] CATH LAB;   Service: Cardiovascular;;  . PERCUTANEOUS CORONARY STENT INTERVENTION (PCI-S) N/A 01/19/2014   Procedure: PERCUTANEOUS CORONARY STENT INTERVENTION (PCI-S);  Surgeon: Leonie Man, MD;  Location: [facility] CATH LAB;  Service: Cardiovascular;  Laterality: N/A;    Past Medical Hx:  Past Medical History:  Diagnosis Date  . Anemia of chronic disease   . Atrial flutter ([place])   . Chronic diastolic CHF (congestive heart failure) ([place])   . Diabetes mellitus without complication ([place])   . Dizziness 02/13/2015  . Hyperlipidemia   . Hypertension   . Hypothyroidism   . MI, old 07/18/2014  . Mitral stenosis   . Pulmonary HTN ([place])    no evidence on last echo 2015  . Vitamin D deficiency disease     Oncology Hx:   No history exists.    Family Hx:  Family History  Problem Relation Age of Onset  . Cancer Mother   . Cirrhosis Father   . Heart attack Sister   . Heart disease Sister   . Diabetes Brother     Vitals:  Blood pressure (!) 128/56, pulse 83, temperature 97.6 F (36.4 C), temperature source Oral, resp. rate 20, height 5\' 2"  (1.575 m), weight 119 lb 12.8 oz (54.3 kg), SpO2 100 %.  Physical Exam:  Well-nourished well-developed elderly female in no acute distress.  Neck: Supple, no lymphadenopathy, no thyromegaly.  Lungs: Clear to auscultation bilaterally.  Cardiac: Regular rate and rhythm. She's not and atrial fibrillation. She has a 2/6 systolic ejection murmur.  Abdomen: Soft, nontender, nondistended. There are no palpable masses or hepatosplenomegaly. There is no fluid wave.  Groins: No lymphadenopathy.  Extremities: Trace edema equal bilaterally.  Pelvic: External genitalia within normal limits. Bimanual examination the cervix is palpably normal. The patient had excruciating pain with palpation of the cervix. She then states she has abdominal pain. An abdominal mass is not readily appreciated on vaginal examination. However, on rectovaginal examination which does not  reproduce her pain there is a mass that fills the posterior pelvis measuring approximately 7-8 cm. It is smooth. I cannot assess mobility secondary to patient's voluntary guarding. Her pain is distractible.  Assessment/Plan: 83 year old with multiple medical issues primarily cardiac related who has a cystic and solid mass that I do not believe represents a malignancy. 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 She has normal tumor markers and no ascites. I do think she is suffering with intermittent version as Dr. Deatra Ina also suggested to her and her son [name] who is accompanying her today. We discussed observation versus proceeding with surgery in a more controlled fashion rather than her having his bout of severe abdominal pain and then having a surgical procedure in an emergent noncontrolled fashion. She and her son both agree that he would like to move forward with future discussion of surgery so that we could potentially perform it in a minimally invasive fashion.  We will contact Dr. Radford Pax her cardiologist regarding perioperative risk stratification and for assistance of management with her anticoagulation. We will need to see if she is comfortable with [place] stopping the Coumadin and not proceeding with a bridge or based on her [place] score if she would like [place] temperature perioperative period the patient also has cardiac stents. She's had no recurrent episodes or recent episodes of chest pain and her congestive heart failure seems to be fairly well compensated.  She is tentatively scheduled for a laparoscopic bilateral salpingo-oophorectomy on October 23. She was offered as her surgical day but she felt that that was "too soon" This should allow sufficient time to give perioperative risk stratification and an opinion from Dr. Radford Pax.  It was a pleasure to participate in the care of this very pleasant patient.  Paiton Fosco A., MD 03/11/2017, 11:32 AM

## 2017-03-12 ENCOUNTER — Ambulatory Visit (INDEPENDENT_AMBULATORY_CARE_PROVIDER_SITE_OTHER): Payer: Medicare Other | Admitting: *Deleted

## 2017-03-12 DIAGNOSIS — I480 Paroxysmal atrial fibrillation: Secondary | ICD-10-CM

## 2017-03-12 DIAGNOSIS — Z5181 Encounter for therapeutic drug level monitoring: Secondary | ICD-10-CM

## 2017-03-12 DIAGNOSIS — Z7901 Long term (current) use of anticoagulants: Secondary | ICD-10-CM

## 2017-03-12 DIAGNOSIS — I4819 Other persistent atrial fibrillation: Secondary | ICD-10-CM

## 2017-03-12 LAB — POCT INR: INR: 2.3

## 2017-03-18 ENCOUNTER — Telehealth: Payer: Self-pay | Admitting: Cardiology

## 2017-03-18 NOTE — Telephone Encounter (Signed)
New message       [facility] Pre-operative Risk Assessment    Request for surgical clearance:  1. What type of surgery is being performed?  Ovarian cyst   2. When is this surgery scheduled?  04/14/17   3. Are there any medications that need to be held prior to surgery and how long? Coumadin - and needs cardiac clearance   4. Name of physician performing surgery?  Dr Everitt Amber   5. What is your office phone and fax number?  [phone] fax Ana [name] 03/18/2017, 1:34 PM  _________________________________________________________________   (provider comments below)

## 2017-03-23 NOTE — Telephone Encounter (Signed)
    Chart reviewed as part of pre-operative protocol coverage. Because of Ana [name]'s past medical history and time since last visit, she will require a follow-up visit in order to better assess preoperative cardiovascular risk prior to clearance. Last OV in 11/2016 included multiple medical comorbidities including atrial flutter, chronic diastolic CHF and cough at that time. Needs f/u visit to ensure cough resolved and no new issues. Will forward to covering nurse to help arrange. Will also cc Dr. Radford Pax to make her aware.  Charlie Pitter, PA-C  03/23/2017, 5:12 PM

## 2017-03-24 NOTE — Telephone Encounter (Signed)
Left a message for the patient to call back to set up an appointment.   

## 2017-03-24 NOTE — Telephone Encounter (Signed)
See previous note. Different covering nurse in pre-op clinic today. Will leave in pre-op box for covering staff to handle today - will need appointment as below. Dayna Dunn PA-C

## 2017-03-25 NOTE — Telephone Encounter (Signed)
The patient has been made aware that she will need an appointment for cardiac surgical clearance. An appointment has been made for 10/16 at 1:30 with Robbie Lis, PA.

## 2017-03-26 ENCOUNTER — Ambulatory Visit (INDEPENDENT_AMBULATORY_CARE_PROVIDER_SITE_OTHER): Payer: Medicare Other | Admitting: *Deleted

## 2017-03-26 ENCOUNTER — Telehealth: Payer: Self-pay | Admitting: *Deleted

## 2017-03-26 DIAGNOSIS — Z7901 Long term (current) use of anticoagulants: Secondary | ICD-10-CM

## 2017-03-26 DIAGNOSIS — I481 Persistent atrial fibrillation: Secondary | ICD-10-CM

## 2017-03-26 DIAGNOSIS — I480 Paroxysmal atrial fibrillation: Secondary | ICD-10-CM

## 2017-03-26 DIAGNOSIS — Z5181 Encounter for therapeutic drug level monitoring: Secondary | ICD-10-CM

## 2017-03-26 DIAGNOSIS — I4819 Other persistent atrial fibrillation: Secondary | ICD-10-CM

## 2017-03-26 LAB — POCT INR: INR: 2

## 2017-03-26 NOTE — Telephone Encounter (Signed)
Contacted the [place] at [facility] and moved the patient's appt form October 16th to October 8th. Contacted the patient and gave the new date/time for the appt. Patient will contact her son for a ride.   Patient's son can't take her to the appt. [place] [facility] and moved appt back to October 16th. Patient is to hold her coumadin that morning per [facility] APP. Patient verbalized understanding.  Per Melissa APP Dr. Denman George wants to see the patient on October 22nd. Patient to call her son and then call the office back.  Patient called back and stated that "My son can't make October 22nd." Spoke with Lenna Sciara APP and Dr. Denman George, they will see the patient before she see the Cardiologist. Patient ask me to call her son and make the appt. Contacted the patient's son Rodman Key and scheduled an appt for October 11th at 3:15pm. Called the patient back and gave her the information.

## 2017-03-30 ENCOUNTER — Ambulatory Visit: Payer: Medicare Other | Admitting: Nurse Practitioner

## 2017-04-02 ENCOUNTER — Ambulatory Visit: Payer: Medicare Other | Attending: Gynecologic Oncology | Admitting: Gynecologic Oncology

## 2017-04-02 ENCOUNTER — Encounter: Payer: Self-pay | Admitting: Gynecologic Oncology

## 2017-04-02 VITALS — BP 153/62 | HR 71 | Temp 97.8°F | Resp 18 | Wt 121.0 lb

## 2017-04-02 DIAGNOSIS — Z7982 Long term (current) use of aspirin: Secondary | ICD-10-CM | POA: Insufficient documentation

## 2017-04-02 DIAGNOSIS — Z803 Family history of malignant neoplasm of breast: Secondary | ICD-10-CM | POA: Diagnosis not present

## 2017-04-02 DIAGNOSIS — I5189 Other ill-defined heart diseases: Secondary | ICD-10-CM | POA: Diagnosis not present

## 2017-04-02 DIAGNOSIS — I110 Hypertensive heart disease with heart failure: Secondary | ICD-10-CM | POA: Insufficient documentation

## 2017-04-02 DIAGNOSIS — D638 Anemia in other chronic diseases classified elsewhere: Secondary | ICD-10-CM | POA: Diagnosis not present

## 2017-04-02 DIAGNOSIS — Z833 Family history of diabetes mellitus: Secondary | ICD-10-CM | POA: Insufficient documentation

## 2017-04-02 DIAGNOSIS — E785 Hyperlipidemia, unspecified: Secondary | ICD-10-CM | POA: Diagnosis not present

## 2017-04-02 DIAGNOSIS — Z8379 Family history of other diseases of the digestive system: Secondary | ICD-10-CM | POA: Insufficient documentation

## 2017-04-02 DIAGNOSIS — Z7901 Long term (current) use of anticoagulants: Secondary | ICD-10-CM | POA: Insufficient documentation

## 2017-04-02 DIAGNOSIS — Z8249 Family history of ischemic heart disease and other diseases of the circulatory system: Secondary | ICD-10-CM | POA: Insufficient documentation

## 2017-04-02 DIAGNOSIS — I5032 Chronic diastolic (congestive) heart failure: Secondary | ICD-10-CM | POA: Insufficient documentation

## 2017-04-02 DIAGNOSIS — R102 Pelvic and perineal pain: Secondary | ICD-10-CM | POA: Diagnosis not present

## 2017-04-02 DIAGNOSIS — I2510 Atherosclerotic heart disease of native coronary artery without angina pectoris: Secondary | ICD-10-CM | POA: Diagnosis not present

## 2017-04-02 DIAGNOSIS — E039 Hypothyroidism, unspecified: Secondary | ICD-10-CM | POA: Diagnosis not present

## 2017-04-02 DIAGNOSIS — Z79899 Other long term (current) drug therapy: Secondary | ICD-10-CM | POA: Diagnosis not present

## 2017-04-02 DIAGNOSIS — E559 Vitamin D deficiency, unspecified: Secondary | ICD-10-CM | POA: Diagnosis not present

## 2017-04-02 DIAGNOSIS — Z7984 Long term (current) use of oral hypoglycemic drugs: Secondary | ICD-10-CM | POA: Diagnosis not present

## 2017-04-02 DIAGNOSIS — Z955 Presence of coronary angioplasty implant and graft: Secondary | ICD-10-CM | POA: Insufficient documentation

## 2017-04-02 DIAGNOSIS — I252 Old myocardial infarction: Secondary | ICD-10-CM | POA: Insufficient documentation

## 2017-04-02 DIAGNOSIS — Z87442 Personal history of urinary calculi: Secondary | ICD-10-CM | POA: Insufficient documentation

## 2017-04-02 DIAGNOSIS — R1900 Intra-abdominal and pelvic swelling, mass and lump, unspecified site: Secondary | ICD-10-CM | POA: Diagnosis not present

## 2017-04-02 DIAGNOSIS — Z888 Allergy status to other drugs, medicaments and biological substances status: Secondary | ICD-10-CM | POA: Diagnosis not present

## 2017-04-02 DIAGNOSIS — E119 Type 2 diabetes mellitus without complications: Secondary | ICD-10-CM | POA: Diagnosis not present

## 2017-04-02 DIAGNOSIS — N9489 Other specified conditions associated with female genital organs and menstrual cycle: Secondary | ICD-10-CM

## 2017-04-02 NOTE — Progress Notes (Signed)
Follow-up Note: Gyn-Onc  Ana [name] 83 y.o. female  CC:  Chief Complaint  Patient presents with  . Adnexal mass    HPI: Patient is seen at the request of Dr. Deatra [name]. Primary cardiologist Dr. Golden [name]. Primary physician Dr. London [name].  Ana [name] is a 83 y.o. female with a hx of permanent atrial flutter, chronic diastolic CHF, pulmonary HTN, minimal mitral stenosis (echo 09/2012), HTN, HL and anemia of chronic disease. She has been kept on coumadin instead of NOAC due to valvular heart dsz. She also has a history of ASCAD with  multivessel CAD s/p PCI with BMS to the LAD and CFX and subsequent staged PCI with BMS to the RCA.   She states that in May she began having episodes of abdominal pain. The first episode awoke early in the morning and she describes as a "attack". She called 911 and went to the emergency room via EMS. She states that she was diagnosed with a urinary tract infection which is common for her and she took antibody which really did not help. She discussed with her son who said that it sounded like kidney stones which she has suffered from. She had subsequently 2 other episodes similar and ultimately underwent an ultrasound as he felt that this was most likely consistent with kidney stones. Ultrasound revealed the uterus to be 6.7 x 4.8 x 2.8 cm with small calcifications. There were no large fibroids. The endometrial thickness was 3 mm but was normal. There was a 1.6 mm focal nodule most consistent with a benign polyp. However, there was an 8.5 x 4.4 x 4 cm complex cystic and solid adnexal mass. She was seen by Dr. Deatra [name] and had a CA-125 performed that was 28. She subsequently was referred to [place] for evaluation and management.  She has 2 different types of pelvic pain. She's has this pain that is like an attack and then she still has some suprapubic discomfort and which she describes as dysuria which feels like "boiling water". She is once again on antibiotics that she  does not recall the name. She denies any history of change in her bowels are she stay she's long history of constipation. She's not sure of its actually gotten any worse lately. She does walk with a walker. She denies any chest pain or shortness of breath. Her last colonoscopy was about 10 years ago. She has stopped having mammograms.  Review of Systems: Constitutional: Denies unintentional weight loss or weight gain. Skin: No rash Cardiovascular: No chest pain, shortness of breath, or edema. She is not going up stairs but she's able to do her grocery shopping with her walker.  Pulmonary: No cough Gastro Intestinal: Reporting intermittent lower abdominal soreness with episodes of severe abdominal pain.  No nausea, vomiting, +constipation. No bright red blood per rectum or change in bowel movement.  Genitourinary: No frequency, urgency, + dysuria. She stay she's incontinent all the time and wears pads. This is no change. Denies vaginal bleeding and discharge.  Musculoskeletal: No pain.  Neurologic: Uses a walker   Current Meds:  Outpatient Encounter Prescriptions as of 04/02/2017  Medication Sig  . acetaminophen (TYLENOL) 500 MG tablet Take 500 mg by mouth every 8 (eight) hours as needed for mild pain, moderate pain, fever or headache.  Marland Kitchen amLODipine (NORVASC) 5 MG tablet TAKE ONE TABLET BY MOUTH ONCE DAILY.  Marland Kitchen aspirin EC 81 MG tablet Take 81 mg by mouth daily.  . Cholecalciferol (VITAMIN D3) 2000 UNITS TABS  Take 2,000 Units by mouth daily.   . Ciprofloxacin (CIPRO PO) Take 250 mg by mouth 2 (two) times daily.   . Coenzyme Q10 200 MG capsule Take 200 mg by mouth daily.  Marland Kitchen levothyroxine (SYNTHROID, LEVOTHROID) 88 MCG tablet Take 88 mcg by mouth daily before breakfast.  . losartan (COZAAR) 100 MG tablet TAKE ONE TABLET BY MOUTH ONCE DAILY  . metFORMIN (GLUCOPHAGE) 1000 MG tablet Take 1 tablet (1,000 mg total) by mouth 2 (two) times daily with a meal.  . Omega-3 Krill Oil 500 MG CAPS Take 1  capsule by mouth daily.   . polyethylene glycol (MIRALAX / GLYCOLAX) packet Take 17 g by mouth daily as needed.  . warfarin (COUMADIN) 2.5 MG tablet Take 2.5 mg by mouth See admin instructions. Takes 2.5mg  on Monday, Wednesday, Friday, and Saturday; then 1.25mg  on Sunday, Tuesday, and Thursday   No facility-administered encounter medications on file as of 04/02/2017.     Allergy:  Allergies  Allergen Reactions  . Welchol [Colesevelam Hcl] Nausea Only  . Zetia [Ezetimibe] Other (See Comments)    Edema   . Statins   . Crestor [Rosuvastatin] Other (See Comments)    Unknown  . Zocor [Simvastatin] Other (See Comments)    Muscle weakness    Social Hx:   Social History   Social History  . Marital status: Married    Spouse name: N/A  . Number of children: N/A  . Years of education: N/A   Occupational History  . Not on file.   Social History Main Topics  . Smoking status: Never Smoker  . Smokeless tobacco: Never Used  . Alcohol use No  . Drug use: No  . Sexual activity: Not on file   Other Topics Concern  . Not on file   Social History Narrative  . No narrative on file    Past Surgical Hx:  Past Surgical History:  Procedure Laterality Date  . CATARACT EXTRACTION    . LEFT HEART CATHETERIZATION WITH CORONARY ANGIOGRAM N/A 01/17/2014   Procedure: LEFT HEART CATHETERIZATION WITH CORONARY ANGIOGRAM;  Surgeon: Ana Sine, MD;  Location: [facility] CATH LAB;  Service: Cardiovascular;  Laterality: N/A;  . PERCUTANEOUS CORONARY STENT INTERVENTION (PCI-S)  01/17/2014   Procedure: PERCUTANEOUS CORONARY STENT INTERVENTION (PCI-S);  Surgeon: Ana Sine, MD;  Location: [facility] CATH LAB;  Service: Cardiovascular;;  . PERCUTANEOUS CORONARY STENT INTERVENTION (PCI-S) N/A 01/19/2014   Procedure: PERCUTANEOUS CORONARY STENT INTERVENTION (PCI-S);  Surgeon: Ana Man, MD;  Location[facility] CATH LAB;  Service: Cardiovascular;  Laterality: N/A;    Past Medical Hx:  Past Medical History:   Diagnosis Date  . Anemia of chronic disease   . Atrial flutter ([place])   . Chronic diastolic CHF (congestive heart failure) ([place])   . Diabetes mellitus without complication ([place])   . Dizziness 02/13/2015  . Hyperlipidemia   . Hypertension   . Hypothyroidism   . MI, old 07/18/2014  . Mitral stenosis   . Pulmonary HTN ([place])    no evidence on last echo 2015  . Vitamin D deficiency disease     Oncology Hx:   No history exists.    Family Hx:  Family History  Problem Relation Age of Onset  . Cancer Mother        ent  . Cirrhosis Father   . Heart attack Sister   . Heart disease Sister   . Cancer Sister 30       breast cancer  . Diabetes Brother   .  Cancer Other 30       breast    Vitals:  Blood pressure (!) 153/62, pulse 71, temperature 97.8 F (36.6 C), temperature source Oral, resp. rate 18, weight 121 lb (54.9 kg), SpO2 100 %.  Physical Exam:  Well-nourished well-developed elderly female in no acute distress.  Neck: Supple, no lymphadenopathy, no thyromegaly.  Lungs: Clear to auscultation bilaterally.  Cardiac: Regular rate and rhythm. She's not and atrial fibrillation. She has a 2/6 systolic ejection murmur.  Abdomen: Soft, nontender, nondistended. There are no palpable masses or hepatosplenomegaly. There is no fluid wave.  Groins: No lymphadenopathy.  Extremities: Trace edema equal bilaterally.  Pelvic: External genitalia within normal limits. Bimanual examination the cervix is palpably normal. The patient had excruciating pain with palpation of the cervix. She then states she has abdominal pain. An abdominal mass is not readily appreciated on vaginal examination. However, on rectovaginal examination which does not reproduce her pain there is a mass that fills the posterior pelvis measuring approximately 7-8 cm. It is smooth. I cannot assess mobility secondary to patient's voluntary guarding. Her pain is distractible.  Assessment/Plan: 83 year old with multiple  medical issues primarily cardiac related who has a cystic and solid mass that I do not believe represents a malignancy. 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 She and her son both agree that he would like to move forward with future discussion of surgery so that we could potentially perform it in a minimally invasive fashion.  We will contact Dr. Radford Pax her cardiologist regarding perioperative risk stratification and for assistance of management with her anticoagulation. We will need to see if she is comfortable with [place] stopping the Coumadin and not proceeding with a bridge or based on her [place] score if she would like [place] temperature perioperative period the patient also has cardiac stents. She's had no recurrent episodes or recent episodes of chest pain and her congestive heart failure seems to be fairly well compensated.  She is tentatively scheduled for a laparoscopic bilateral salpingo-oophorectomy on October 23. This should allow sufficient time to give perioperative risk stratification and an opinion from Dr. Radford Pax. We will also ask Dr Radford Pax if it is appropriate to stop her Coumadin preop or if she needs a Lovenox bridge.  [name], MD 04/02/2017, 4:51 PM

## 2017-04-02 NOTE — Patient Instructions (Signed)
Preparing for your Surgery  Plan for surgery on April 14, 2017 with Dr. Everitt Amber at [facility].  Pre-operative [place] will receive a phone call from presurgical testing at [facility] to arrange for a pre-operative testing appointment before your surgery.  This appointment normally occurs one to two weeks before your scheduled surgery.   -Bring your insurance card, copy of an advanced directive if applicable, medication list  -At that visit, you will be asked to sign a consent for a possible blood transfusion in case a transfusion becomes necessary during surgery.  The need for a blood transfusion is rare but having consent is a necessary part of your care.     -We will let you know about stopping your Coumadin.  Day Before Surgery at [place] will be asked to take in a light diet the day before surgery.  Avoid carbonated beverages.  You will be advised to have nothing to eat or drink after midnight the evening before.    Eat a light diet the day before surgery.  Examples including soups, broths, toast, yogurt, mashed potatoes.  Things to avoid include carbonated beverages (fizzy beverages), raw fruits and raw vegetables, or beans.   If your bowels are filled with gas, your surgeon will have difficulty visualizing your pelvic organs which increases your surgical risks.  Your role in recovery Your role is to become active as soon as directed by your doctor, while still giving yourself time to heal.  Rest when you feel tired. You will be asked to do the following in order to speed your recovery:  - Cough and breathe deeply. This helps toclear and expand your lungs and can prevent pneumonia. You may be given a spirometer to practice deep breathing. A staff member will show you how to use the spirometer. - Do mild physical activity. Walking or moving your legs help your circulation and body functions return to normal. A staff member will help you when  you try to walk and will provide you with simple exercises. Do not try to get up or walk alone the first time. - Actively manage your pain. Managing your pain lets you move in comfort. We will ask you to rate your pain on a scale of zero to 10. It is your responsibility to tell your doctor or nurse where and how much you hurt so your pain can be treated.  Special Considerations -If you are diabetic, you may be placed on insulin after surgery to have closer control over your blood sugars to promote healing and recovery.  This does not mean that you will be discharged on insulin.  If applicable, your oral antidiabetics will be resumed when you are tolerating a solid diet.  -Your final pathology results from surgery should be available by the Friday after surgery and the results will be relayed to you when available.  -Dr. Lahoma Crocker is the Surgeon that assists your GYN Oncologist with surgery.  The next day after your surgery you will either see your GYN Oncologist or Dr. Lahoma Crocker.   Blood Transfusion Information WHAT IS A BLOOD TRANSFUSION? A transfusion is the replacement of blood or some of its parts. Blood is made up of multiple cells which provide different functions.  Red blood cells carry oxygen and are used for blood loss replacement.  White blood cells fight against infection.  Platelets control bleeding.  Plasma helps clot blood.  Other blood products are available for specialized needs, such as  hemophilia or other clotting disorders. BEFORE THE TRANSFUSION  Who gives blood for transfusions?   You may be able to donate blood to be used at a later date on yourself (autologous donation).  Relatives can be asked to donate blood. This is generally not any safer than if you have received blood from a stranger. The same precautions are taken to ensure safety when a relative's blood is donated.  Healthy volunteers who are fully evaluated to make sure their blood is  safe. This is blood bank blood. Transfusion therapy is the safest it has ever been in the practice of medicine. Before blood is taken from a donor, a complete history is taken to make sure that person has no history of diseases nor engages in risky social behavior (examples are intravenous drug use or sexual activity with multiple partners). The donor's travel history is screened to minimize risk of transmitting infections, such as malaria. The donated blood is tested for signs of infectious diseases, such as HIV and hepatitis. The blood is then tested to be sure it is compatible with you in order to minimize the chance of a transfusion reaction. If you or a relative donates blood, this is often done in anticipation of surgery and is not appropriate for emergency situations. It takes many days to process the donated blood. RISKS AND COMPLICATIONS Although transfusion therapy is very safe and saves many lives, the main dangers of transfusion include:   Getting an infectious disease.  Developing a transfusion reaction. This is an allergic reaction to something in the blood you were given. Every precaution is taken to prevent this. The decision to have a blood transfusion has been considered carefully by your caregiver before blood is given. Blood is not given unless the benefits outweigh the risks.

## 2017-04-03 ENCOUNTER — Other Ambulatory Visit: Payer: Self-pay | Admitting: Cardiology

## 2017-04-03 NOTE — Progress Notes (Signed)
Cardiology Office Note    Date:  04/07/2017   ID:  Ana [name], DOB 10/26/1931, MRN [id number]  PCP:  London Pepper, MD  Cardiologist:  Dr. Radford Pax  Chief Complaint: Pre operative clearance  History of Present Illness:   Ana [name] is a 83 y.o. female with a hx of permanent atrial flutter, CAD chronic diastolic CHF, pulmonary HTN, minimal mitral stenosis (echo 09/2012), HTN, HL and anemia of chronic disease presents for pre-operative evaluation of ovarian cyst by Dr. Everitt Amber 04/14/17.  She has been kept on coumadin instead of NOAC due to valvular heart dsz. She also has a history of ASCAD with multivessel CAD s/p PCI with BMS to the LAD and CFX and subsequent staged PCI with BMS to the RCA.  She was doing well on cardiac stand point when last seen by Dr. Radford Pax 12/09/16. She had cough due to URI.   Here today for pre-operative clearance. No complains. She continues to live independently and dose house hold chores without any issue. The patient denies nausea, vomiting, fever, chest pain, palpitations, shortness of breath, orthopnea, PND, dizziness, syncope, cough, congestion, abdominal pain, hematochezia, melena, lower extremity edema.   Past Medical History:  Diagnosis Date  . Anemia of chronic disease   . Atrial flutter ([place])   . Chronic diastolic CHF (congestive heart failure) ([place])   . Diabetes mellitus without complication ([place])   . Dizziness 02/13/2015  . Hyperlipidemia   . Hypertension   . Hypothyroidism   . MI, old 07/18/2014  . Mitral stenosis   . Pulmonary HTN ([place])    no evidence on last echo 2015  . Vitamin D deficiency disease     Past Surgical History:  Procedure Laterality Date  . CATARACT EXTRACTION    . LEFT HEART CATHETERIZATION WITH CORONARY ANGIOGRAM N/A 01/17/2014   Procedure: LEFT HEART CATHETERIZATION WITH CORONARY ANGIOGRAM;  Surgeon: Troy Sine, MD;  Location: [facility] CATH LAB;  Service: Cardiovascular;  Laterality: N/A;  . PERCUTANEOUS CORONARY  STENT INTERVENTION (PCI-S)  01/17/2014   Procedure: PERCUTANEOUS CORONARY STENT INTERVENTION (PCI-S);  Surgeon: Troy Sine, MD;  Location: [facility] CATH LAB;  Service: Cardiovascular;;  . PERCUTANEOUS CORONARY STENT INTERVENTION (PCI-S) N/A 01/19/2014   Procedure: PERCUTANEOUS CORONARY STENT INTERVENTION (PCI-S);  Surgeon: Leonie Man, MD;  Location: [facility] CATH LAB;  Service: Cardiovascular;  Laterality: N/A;    Current Medications: Prior to Admission medications   Medication Sig Start Date End Date Taking? Authorizing Provider  acetaminophen (TYLENOL) 500 MG tablet Take 500 mg by mouth every 8 (eight) hours as needed for mild pain, moderate pain, fever or headache.    [provider]  amLODipine (NORVASC) 5 MG tablet TAKE ONE TABLET BY MOUTH ONCE DAILY. 01/16/16   Sueanne Margarita, MD  aspirin EC 81 MG tablet Take 81 mg by mouth daily.    [provider]  Cholecalciferol (VITAMIN D3) 2000 UNITS TABS Take 2,000 Units by mouth daily.     [provider]  Ciprofloxacin (CIPRO PO) Take 250 mg by mouth 2 (two) times daily.  03/10/17   [provider]  Coenzyme Q10 200 MG capsule Take 200 mg by mouth daily.    [provider]  levothyroxine (SYNTHROID, LEVOTHROID) 88 MCG tablet Take 88 mcg by mouth daily before breakfast.    [provider]  losartan (COZAAR) 100 MG tablet TAKE ONE TABLET BY MOUTH ONCE DAILY 09/30/16   Sueanne Margarita, MD  metFORMIN (GLUCOPHAGE) 1000 MG tablet  Take 1 tablet (1,000 mg total) by mouth 2 (two) times daily with a meal. 02/16/14   Turner, Eber Hong, MD  Omega-3 Krill Oil 500 MG CAPS Take 1 capsule by mouth daily.     [provider]  polyethylene glycol (MIRALAX / GLYCOLAX) packet Take 17 g by mouth daily as needed.    [provider]  warfarin (COUMADIN) 2.5 MG tablet Take 2.5 mg by mouth See admin instructions. Takes 2.5mg  on Monday, Wednesday, Friday, and Saturday; then 1.25mg  on Sunday, Tuesday, and Thursday     [provider]  warfarin (COUMADIN) 2.5 MG tablet TAKE BY MOUTH AS DIRECTED BY COUMADIN CLINIC 04/03/17   Sueanne Margarita, MD    Allergies:   Roanna Banning hcl]; Zetia [ezetimibe]; Statins; Crestor [rosuvastatin]; and Zocor [simvastatin]   Social History   Social History  . Marital status: Widowed    Spouse name: N/A  . Number of children: N/A  . Years of education: N/A   Social History Main Topics  . Smoking status: Never Smoker  . Smokeless tobacco: Never Used  . Alcohol use No  . Drug use: No  . Sexual activity: Not Asked   Other Topics Concern  . None   Social History Narrative  . None     Family History:  The patient's family history includes Cancer in her mother; Cancer (age of onset: 36) in her other and sister; Cirrhosis in her father; Diabetes in her brother; Heart attack in her sister; Heart disease in her sister.   ROS:   Please see the history of present illness.    ROS All other systems reviewed and are negative.   PHYSICAL EXAM:   VS:  BP (!) 150/60   Pulse 73   Ht 5\' 2"  (1.575 m)   Wt 122 lb 12.8 oz (55.7 kg)   BMI 22.46 kg/m    GEN: Well nourished, well developed, in no acute distress  HEENT: normal  Neck: no JVD, carotid bruits, or masses Cardiac: RRR; no murmurs, rubs, or gallops,no edema  Respiratory:  clear to auscultation bilaterally, normal work of breathing GI: soft, nontender, nondistended, + BS MS: no deformity or atrophy  Skin: warm and dry, no rash Neuro:  Alert and Oriented x 3, Strength and sensation are intact Psych: euthymic mood, full affect  Wt Readings from Last 3 Encounters:  04/07/17 122 lb 12.8 oz (55.7 kg)  04/02/17 121 lb (54.9 kg)  03/11/17 119 lb 12.8 oz (54.3 kg)      Studies/Labs Reviewed:   EKG:  EKG is ordered today.  The ekg ordered today demonstrates Aflutter 73 bpm  Recent Labs: 11/19/2016: Hemoglobin 11.1; Platelets 216 12/19/2016: ALT 10; BUN 24; Creatinine, Ser 0.88; Potassium 4.7;  Sodium 132   Lipid Panel    Component Value Date/Time   CHOL 135 12/19/2016 0911   TRIG 90 12/19/2016 0911   HDL 45 12/19/2016 0911   CHOLHDL 3.0 12/19/2016 0911   CHOLHDL 3.2 06/02/2016 1101   VLDL 14 06/02/2016 1101   LDLCALC 72 12/19/2016 0911    Additional studies/ records that were reviewed today include:   Echocardiogram: 12/2013 Study Conclusions  - Left ventricle: E/e&'>16.1 suggestive of elevated LV filling pressures. The cavity size was normal. There was mild focal basal hypertrophy of the septum. Systolic function was normal. The estimated ejection fraction was 50%. There is hypokinesis of the mid-apicalanteroseptal myocardium. - Mitral valve: Calcified annulus. Mild thickening and calcification. There was mild regurgitation. Valve area by continuity  equation (using LVOT flow): 1.8 cm^2. - Pulmonic valve: There was trivial regurgitation.  Cardiac Catheterization: 12/2013 FINDINGS:  Hemodynamics:   Central Aortic Pressure / Mean: 112/50/74 mmHg   Left Ventricular Pressure / LVEDP: 113/12/20 mmHg  PATIENT DISPOSITION:   The patient was transferred to the PACU holding area in a hemodynamicaly stable, chest pain free condition.   The patient tolerated the procedure well, and there were no complications. EBL: < 5 ml   The patient was stable before, during, and after the procedure. POST-OPERATIVE DIAGNOSIS:   Successful PCI of the mid RCA reducing a 80-90% stenosis to 0% with TIMI-3 flow restored to the distal vessel occlusion. Brisk TIMI-3 flow major RV marginal branch providing collaterals to the distal RCA system.   Relatively normal systolic pressures but moderately elevated LVEDP PLAN OF CARE:   Return to TCU for postcatheterization care. Standard post radial Care with TR band removal.   Will defer decision as to when to initiate Coumadin therapy to the primary service. Minor scanning was that we would initially use aspirin plus Plavix and not  start Coumadin until later. With 3 new bare-metal stents, would need at least one month of dual antiplatelet therapy after which my preference would be to stop aspirin and start Coumadin for A. Fib.   She does have an elevated LVEDP and made benefit from low-dose diuretic on discharge.   Anticipate discharge in the morning if stable.   ASSESSMENT & PLAN:    1. CAD with  PCI (BMS) to the LAD and CFX and subsequent staged PCI with BMS to the RCA  - Last PCI 2015. No angina. Continue ASA. Not on statin due to myalgias.   2. Permanent atrial flutter - Rate controlled. Asymptomatic. On coumadin for anticoagulation.   3. Chronic diastolic CHF - Euvolemic.   4.  HTN - BP minimally elevated at 150/60. Advised to keep log. Continue current therapy.   5.  Pre-operative evaluation for ovarian cyst -On warfarin for anticoagulation. She is advised to hold coumadin starting today-->Will be managed by Coumadin clinic.  - Her [facility] activity status index in 6.05 METS. She does household chores without any issue. Can hold ASA if needed per surgeon. The patient is cleared for surgery with low to moderate cardiac risk based on history.   Medication Adjustments/Labs and Tests Ordered: Current medicines are reviewed at length with the patient today.  Concerns regarding medicines are outlined above.  Medication changes, Labs and Tests ordered today are listed in the Patient Instructions below. Patient Instructions  Medication Instructions:  Your physician recommends that you continue on your current medications as directed. Please refer to the Current Medication list given to you today.   Labwork: None Ordered   Testing/Procedures: None Ordered   Follow-Up: Your physician recommends that you schedule a follow-up appointment in: 3 months with Dr. Radford Pax   If you need a refill on your cardiac medications before your next appointment, please call your pharmacy.   Thank you for choosing [facility]! Christen Bame, RN [phone]       Signed, Leanor Kail, [place]  04/07/2017 2:00 PM    [address], [place], [place]  [address] Phone: [phone]; Fax: [phone]

## 2017-04-07 ENCOUNTER — Ambulatory Visit (INDEPENDENT_AMBULATORY_CARE_PROVIDER_SITE_OTHER): Payer: Medicare Other | Admitting: *Deleted

## 2017-04-07 ENCOUNTER — Encounter: Payer: Self-pay | Admitting: Physician Assistant

## 2017-04-07 ENCOUNTER — Ambulatory Visit: Payer: Medicare Other | Admitting: Physician Assistant

## 2017-04-07 ENCOUNTER — Encounter (INDEPENDENT_AMBULATORY_CARE_PROVIDER_SITE_OTHER): Payer: Self-pay

## 2017-04-07 ENCOUNTER — Telehealth: Payer: Self-pay

## 2017-04-07 ENCOUNTER — Ambulatory Visit (INDEPENDENT_AMBULATORY_CARE_PROVIDER_SITE_OTHER): Payer: Medicare Other | Admitting: Physician Assistant

## 2017-04-07 VITALS — BP 150/60 | HR 73 | Ht 62.0 in | Wt 122.8 lb

## 2017-04-07 DIAGNOSIS — I4819 Other persistent atrial fibrillation: Secondary | ICD-10-CM

## 2017-04-07 DIAGNOSIS — Z7901 Long term (current) use of anticoagulants: Secondary | ICD-10-CM | POA: Diagnosis not present

## 2017-04-07 DIAGNOSIS — I10 Essential (primary) hypertension: Secondary | ICD-10-CM

## 2017-04-07 DIAGNOSIS — I481 Persistent atrial fibrillation: Secondary | ICD-10-CM | POA: Diagnosis not present

## 2017-04-07 DIAGNOSIS — I5032 Chronic diastolic (congestive) heart failure: Secondary | ICD-10-CM

## 2017-04-07 DIAGNOSIS — I2510 Atherosclerotic heart disease of native coronary artery without angina pectoris: Secondary | ICD-10-CM | POA: Diagnosis not present

## 2017-04-07 DIAGNOSIS — Z5181 Encounter for therapeutic drug level monitoring: Secondary | ICD-10-CM

## 2017-04-07 DIAGNOSIS — Z01810 Encounter for preprocedural cardiovascular examination: Secondary | ICD-10-CM

## 2017-04-07 DIAGNOSIS — I480 Paroxysmal atrial fibrillation: Secondary | ICD-10-CM

## 2017-04-07 LAB — POCT INR: INR: 1.5

## 2017-04-07 MED ORDER — ENOXAPARIN SODIUM 80 MG/0.8ML ~~LOC~~ SOLN: 80.0000 mg | SUBCUTANEOUS | 0 refills | Status: DC

## 2017-04-07 NOTE — Telephone Encounter (Signed)
Received Cardiac Clearance for surgery 04-14-17. Called patient to review taking ASA 81 mg daily. Last dose of ASA is 04-12-17.  No coumadin or Lovenox per Dr. Denman George. Dr. Denman George will review post op anticoagulation with patient's son post op. Ana [name] given this information. Teach Back method used.  Pt can call Dr. Serita Grit office is she has any question about pre operative  anticoagulation regime.

## 2017-04-07 NOTE — Patient Instructions (Signed)
Medication Instructions:  Your physician recommends that you continue on your current medications as directed. Please refer to the Current Medication list given to you today.   Labwork: None Ordered   Testing/Procedures: None Ordered   Follow-Up: Your physician recommends that you schedule a follow-up appointment in: 3 months with Dr. Radford Pax   If you need a refill on your cardiac medications before your next appointment, please call your pharmacy.   Thank you for choosing [facility]! Christen Bame, RN [phone]

## 2017-04-07 NOTE — Patient Instructions (Signed)
10/17: Last dose of Coumadin.  10/18: No Coumadin or Lovenox.  10/19: Inject Lovenox 80mg  in the fatty abdominal tissue at least 2 inches from the belly button once daily at 8pm. No Coumadin.  10/20: Inject Lovenox in the fatty tissue at 8pm. No Coumadin.  10/21: Inject Lovenox in the fatty tissue at 8pm. No Coumadin.  10/22: No Lovenox. No Coumadin.  10/23: Procedure Day - No Lovenox - Resume Coumadin in the evening or as directed by doctor (take an extra half tablet with usual dose for 2 days then resume normal dose).  10/24: Resume Lovenox inject in the fatty tissue at 8am and take Coumadin.  10/25: Inject Lovenox in the fatty tissue at 8am and take Coumadin.  10/26: Inject Lovenox in the fatty tissue at 8am and take Coumadin.  10/27: Inject Lovenox in the fatty tissue at 8am and take Coumadin.  10/28: Inject Lovenox in the fatty tissue at 8am and take Coumadin.  10/29: Coumadin appt to check INR.

## 2017-04-07 NOTE — Telephone Encounter (Addendum)
Pt takes warfarin for afib with CHADS2VASc score of 7 (age, sex, CAD, CHF, HTN, DM). Per protocol, pt will require Lovenox bridge. Will coordinate in Coumadin clinic.

## 2017-04-09 ENCOUNTER — Encounter (HOSPITAL_COMMUNITY): Payer: Self-pay

## 2017-04-09 ENCOUNTER — Encounter (HOSPITAL_COMMUNITY)
Admission: RE | Admit: 2017-04-09 | Discharge: 2017-04-09 | Disposition: A | Discharge status: Home / Self Care | Payer: Medicare Other | Source: Ambulatory Visit | Attending: Gynecologic Oncology | Admitting: Gynecologic Oncology

## 2017-04-09 ENCOUNTER — Telehealth: Payer: Self-pay | Admitting: Gynecologic Oncology

## 2017-04-09 ENCOUNTER — Telehealth: Payer: Self-pay | Admitting: *Deleted

## 2017-04-09 DIAGNOSIS — N839 Noninflammatory disorder of ovary, fallopian tube and broad ligament, unspecified: Secondary | ICD-10-CM | POA: Insufficient documentation

## 2017-04-09 DIAGNOSIS — Z01818 Encounter for other preprocedural examination: Secondary | ICD-10-CM | POA: Insufficient documentation

## 2017-04-09 HISTORY — DX: Unspecified urinary incontinence: R32

## 2017-04-09 LAB — CBC
HEMATOCRIT: 32.2 % — AB (ref 36.0–46.0)
Hemoglobin: 10.4 g/dL — ABNORMAL LOW (ref 12.0–15.0)
MCH: 30.3 pg (ref 26.0–34.0)
MCHC: 32.3 g/dL (ref 30.0–36.0)
MCV: 93.9 fL (ref 78.0–100.0)
PLATELETS: 233 10*3/uL (ref 150–400)
RBC: 3.43 MIL/uL — AB (ref 3.87–5.11)
RDW: 12.5 % (ref 11.5–15.5)
WBC: 4.5 10*3/uL (ref 4.0–10.5)

## 2017-04-09 LAB — URINALYSIS, ROUTINE W REFLEX MICROSCOPIC
BILIRUBIN URINE: NEGATIVE
Bacteria, UA: NONE SEEN
Glucose, UA: NEGATIVE mg/dL
Ketones, ur: NEGATIVE mg/dL
NITRITE: NEGATIVE
PROTEIN: NEGATIVE mg/dL
SPECIFIC GRAVITY, URINE: 1.008 (ref 1.005–1.030)
pH: 6 (ref 5.0–8.0)

## 2017-04-09 LAB — COMPREHENSIVE METABOLIC PANEL
ALBUMIN: 3.9 g/dL (ref 3.5–5.0)
ALT: 13 U/L — AB (ref 14–54)
AST: 18 U/L (ref 15–41)
Alkaline Phosphatase: 55 U/L (ref 38–126)
Anion gap: 9 (ref 5–15)
BUN: 25 mg/dL — AB (ref 6–20)
CALCIUM: 9.4 mg/dL (ref 8.9–10.3)
CO2: 27 mmol/L (ref 22–32)
Chloride: 104 mmol/L (ref 101–111)
Creatinine, Ser: 0.8 mg/dL (ref 0.44–1.00)
GFR calc Af Amer: >60 mL/min (ref >60)
GLUCOSE: 111 mg/dL — AB (ref 65–99)
Potassium: 4.8 mmol/L (ref 3.5–5.1)
Sodium: 140 mmol/L (ref 135–145)
TOTAL PROTEIN: 7.7 g/dL (ref 6.5–8.1)
Total Bilirubin: 0.6 mg/dL (ref 0.3–1.2)

## 2017-04-09 LAB — PROTIME-INR
INR: 1.33
Prothrombin Time: 16.4 seconds — ABNORMAL HIGH (ref 11.4–15.2)

## 2017-04-09 LAB — GLUCOSE, CAPILLARY: GLUCOSE-CAPILLARY: 106 mg/dL — AB (ref 65–99)

## 2017-04-09 LAB — ABO/RH: ABO/RH(D): A NEG

## 2017-04-09 NOTE — Telephone Encounter (Signed)
Spoke with Debbrah Alar, NP who states that Dr Denman George (operating MD) does not want pt on a Lovenox bridge prior to procedure due to elevated bleeding risk of procedure. They would potentially be ok with post-op bridge depending on any bleeding during the procedure.  With CHADS2VASc score of 7, pt would be bridged with Lovenox per our protocol.  Will forward to Dr Radford Pax for input.

## 2017-04-09 NOTE — Progress Notes (Addendum)
At preop appointment son, Ana [name] was concernced regarding Lovenox and Coumadin instructions preop.  Son stated at appt with Dr Ana [name] , Dr Ana [name] stated patient would be given instructions regarding Lovenox and Coumadin at appt with Cardiology for clearance on 04/07/17.  At 04/07/17 appointment with Cardiology ( in epic ) son stated Coumadin Clinic gave instructions for Lovenox bridge and Coumadin and he went to drugstore and picked up Lovenox.  After returning home on 04/07/17 then patient stated that she received  A call stating NO more Coumadin prior to surgery and No Lovenox and last dose of Aspirin  on 04/12/17 per cardiology office.  I told him I would call office of Dr Ana [name] and speak with Zoila Shutter which was done. Melissa Cross,NP stated that Cardiology PA at office wanted no Lovenox bridge but Coumadin Clinic instructed Lovenox bridge and there was miscommunication regarding this.  Melissa Cross,NP stated patient was to have no Lovenox and NO Coumadin since 04/07/17 and last dose of Aspirin on 04/12/17.  Both patient and son  stated they were aware No Coumadin since 04/07/17 and NO LOVENOX and son  , Ana [name], reported he had returned Lovenox.  Patient stated and had it written down last dose of Aspirin on 04/12/17 and had it wrriten down no Coumadin or Lovenox.

## 2017-04-09 NOTE — Pre-Procedure Instructions (Signed)
Surgical clearance in 04/07/17 office visit with cardiologist in epic EKG in epic 12/09/16 Last of visit Dr. Radford Pax (cardiology) 12/09/16 in epic and on chart

## 2017-04-09 NOTE — Telephone Encounter (Signed)
Spoke with Melissa at Dr Serita Grit office regarding telephone note from [place] on 04/07/2017  regarding Lovenox Bridge for this pt . Lenna Sciara states she will talk with Dr Denman George and call us back

## 2017-04-09 NOTE — Progress Notes (Addendum)
At time of preop appointment anesthesia, Dr Tobias Alexander was made aware of patient history of pulmonary hypertension , coronary stents, hx of MI in [address] , atrial flutter , hx of CHF, mitral valve stenosis.  Shown to anesthesia 2015 ehco, EKG on 12/09/16 and LOV of 04/07/17 from Cardiology .  No new orders given.

## 2017-04-09 NOTE — Telephone Encounter (Signed)
Spoke with Ivin Booty and Pharmacist.  Per Dr. Denman George, due to the substantial increase for bleeding, she does not want the patient to bridge with lovenox.  She is to take her aspirin until Oct 21 then nothing until after surgery.

## 2017-04-09 NOTE — Patient Instructions (Addendum)
KEIGAN GIRTEN  04/09/2017   Your procedure is scheduled on: 04/14/17   Report to [facility] Main  Entrance   Take [place]  elevators to 3rd floor to  [place] at 5:30 AM.   Call this number if you have problems the morning of surgery [phone]    Remember: ONLY 1 PERSON MAY GO WITH YOU TO SHORT STAY TO GET  READY MORNING OF YOUR SURGERY.   Do not eat food or drink liquids :After Midnight.     Take these medicines the morning of surgery with A SIP OF WATER: Amlodipine, Levothyroxine   DO NOT TAKE ANY DIABETIC MEDICATIONS DAY OF YOUR SURGERY                               You may not have any metal on your body including hair pins and              piercings  Do not wear jewelry, make-up, lotions, powders or perfumes, deodorant             Do not wear nail polish.  Do not shave  48 hours prior to surgery.                 Do not bring valuables to the hospital. [place].  Contacts, dentures or bridgework may not be worn into surgery.    Patients discharged the day of surgery will not be allowed to drive home.    Name and phone number of your driver:  Rodman Key [phone]  Special Instructions: N/A              Please read over the following fact sheets you were given: _____________________________________________________________________             [facility] - Preparing for Surgery Before surgery, you can play an important role.  Because skin is not sterile, your skin needs to be as free of germs as possible.  You can reduce the number of germs on your skin by washing with CHG (chlorahexidine gluconate) soap before surgery.  CHG is an antiseptic cleaner which kills germs and bonds with the skin to continue killing germs even after washing. Please DO NOT use if you have an allergy to CHG or antibacterial soaps.  If your skin becomes reddened/irritated stop using the CHG and inform your nurse  when you arrive at Short Stay. Do not shave (including legs and underarms) for at least 48 hours prior to the first CHG shower.  You may shave your face/neck. Please follow these instructions carefully:  1.  Shower with CHG Soap the night before surgery and the  morning of Surgery.  2.  If you choose to wash your hair, wash your hair first as usual with your  normal  shampoo.  3.  After you shampoo, rinse your hair and body thoroughly to remove the  shampoo.                           4.  Use CHG as you would any other liquid soap.  You can apply chg directly  to the skin and wash  Gently with a scrungie or clean washcloth.  5.  Apply the CHG Soap to your body ONLY FROM THE NECK DOWN.   Do not use on face/ open                           Wound or open sores. Avoid contact with eyes, ears mouth and genitals (private parts).                       Wash face,  Genitals (private parts) with your normal soap.             6.  Wash thoroughly, paying special attention to the area where your surgery  will be performed.  7.  Thoroughly rinse your body with warm water from the neck down.  8.  DO NOT shower/wash with your normal soap after using and rinsing off  the CHG Soap.                9.  Pat yourself dry with a clean towel.            10.  Wear clean pajamas.            11.  Place clean sheets on your bed the night of your first shower and do not  sleep with pets. Day of Surgery : Do not apply any lotions/deodorants the morning of surgery.  Please wear clean clothes to the hospital/surgery center.  FAILURE TO FOLLOW THESE INSTRUCTIONS MAY RESULT IN THE CANCELLATION OF YOUR SURGERY PATIENT SIGNATURE_________________________________  NURSE SIGNATURE__________________________________  ________________________________________________________________________  WHAT IS A BLOOD TRANSFUSION? Blood Transfusion Information  A transfusion is the replacement of blood or some of its  parts. Blood is made up of multiple cells which provide different functions.  Red blood cells carry oxygen and are used for blood loss replacement.  White blood cells fight against infection.  Platelets control bleeding.  Plasma helps clot blood.  Other blood products are available for specialized needs, such as hemophilia or other clotting disorders. BEFORE THE TRANSFUSION  Who gives blood for transfusions?   Healthy volunteers who are fully evaluated to make sure their blood is safe. This is blood bank blood. Transfusion therapy is the safest it has ever been in the practice of medicine. Before blood is taken from a donor, a complete history is taken to make sure that person has no history of diseases nor engages in risky social behavior (examples are intravenous drug use or sexual activity with multiple partners). The donor's travel history is screened to minimize risk of transmitting infections, such as malaria. The donated blood is tested for signs of infectious diseases, such as HIV and hepatitis. The blood is then tested to be sure it is compatible with you in order to minimize the chance of a transfusion reaction. If you or a relative donates blood, this is often done in anticipation of surgery and is not appropriate for emergency situations. It takes many days to process the donated blood. RISKS AND COMPLICATIONS Although transfusion therapy is very safe and saves many lives, the main dangers of transfusion include:   Getting an infectious disease.  Developing a transfusion reaction. This is an allergic reaction to something in the blood you were given. Every precaution is taken to prevent this. The decision to have a blood transfusion has been considered carefully by your caregiver before blood is given. Blood is not given unless the benefits outweigh  the risks. AFTER THE TRANSFUSION  Right after receiving a blood transfusion, you will usually feel much better and more energetic.  This is especially true if your red blood cells have gotten low (anemic). The transfusion raises the level of the red blood cells which carry oxygen, and this usually causes an energy increase.  The nurse administering the transfusion will monitor you carefully for complications. HOME CARE INSTRUCTIONS  No special instructions are needed after a transfusion. You may find your energy is better. Speak with your caregiver about any limitations on activity for underlying diseases you may have. SEEK MEDICAL CARE IF:   Your condition is not improving after your transfusion.  You develop redness or irritation at the intravenous (IV) site. SEEK IMMEDIATE MEDICAL CARE IF:  Any of the following symptoms occur over the next 12 hours:  Shaking chills.  You have a temperature by mouth above 102 F (38.9 C), not controlled by medicine.  Chest, back, or muscle pain.  People around you feel you are not acting correctly or are confused.  Shortness of breath or difficulty breathing.  Dizziness and fainting.  You get a rash or develop hives.  You have a decrease in urine output.  Your urine turns a dark color or changes to pink, red, or brown. Any of the following symptoms occur over the next 10 days:  You have a temperature by mouth above 102 F (38.9 C), not controlled by medicine.  Shortness of breath.  Weakness after normal activity.  The white part of the eye turns yellow (jaundice).  You have a decrease in the amount of urine or are urinating less often.  Your urine turns a dark color or changes to pink, red, or brown. Document Released: 06/06/2000 Document Revised: 09/01/2011 Document Reviewed: 01/24/2008 ExitCare Patient Information 2014 [place].  _______________________________________________________________________   Incentive Spirometer  An incentive spirometer is a tool that can help keep your lungs clear and active. This tool measures how well you are filling  your lungs with each breath. Taking long deep breaths may help reverse or decrease the chance of developing breathing (pulmonary) problems (especially infection) following:  A long period of time when you are unable to move or be active. BEFORE THE PROCEDURE   If the spirometer includes an indicator to show your best effort, your nurse or respiratory therapist will set it to a desired goal.  If possible, sit up straight or lean slightly forward. Try not to slouch.  Hold the incentive spirometer in an upright position. INSTRUCTIONS FOR USE  1. Sit on the edge of your bed if possible, or sit up as far as you can in bed or on a chair. 2. Hold the incentive spirometer in an upright position. 3. Breathe out normally. 4. Place the mouthpiece in your mouth and seal your lips tightly around it. 5. Breathe in slowly and as deeply as possible, raising the piston or the ball toward the top of the column. 6. Hold your breath for 3-5 seconds or for as long as possible. Allow the piston or ball to fall to the bottom of the column. 7. Remove the mouthpiece from your mouth and breathe out normally. 8. Rest for a few seconds and repeat Steps 1 through 7 at least 10 times every 1-2 hours when you are awake. Take your time and take a few normal breaths between deep breaths. 9. The spirometer may include an indicator to show your best effort. Use the indicator as a goal to  work toward during each repetition. 10. After each set of 10 deep breaths, practice coughing to be sure your lungs are clear. If you have an incision (the cut made at the time of surgery), support your incision when coughing by placing a pillow or rolled up towels firmly against it. Once you are able to get out of bed, walk around indoors and cough well. You may stop using the incentive spirometer when instructed by your caregiver.  RISKS AND COMPLICATIONS  Take your time so you do not get dizzy or light-headed.  If you are in pain, you may  need to take or ask for pain medication before doing incentive spirometry. It is harder to take a deep breath if you are having pain. AFTER USE  Rest and breathe slowly and easily.  It can be helpful to keep track of a log of your progress. Your caregiver can provide you with a simple table to help with this. If you are using the spirometer at home, follow these instructions: [place] IF:   You are having difficultly using the spirometer.  You have trouble using the spirometer as often as instructed.  Your pain medication is not giving enough relief while using the spirometer.  You develop fever of 100.5 F (38.1 C) or higher. SEEK IMMEDIATE MEDICAL CARE IF:   You cough up bloody sputum that had not been present before.  You develop fever of 102 F (38.9 C) or greater.  You develop worsening pain at or near the incision site. MAKE SURE YOU:   Understand these instructions.  Will watch your condition.  Will get help right away if you are not doing well or get worse. Document Released: 10/20/2006 Document Revised: 09/01/2011 Document Reviewed: 12/21/2006 [facility] Patient Information 2014 [place], [place].   ________________________________________________________________________

## 2017-04-09 NOTE — Telephone Encounter (Signed)
The only other option would be to stop coumadin and once INR 2 then admit for IV Heparin until surgery

## 2017-04-10 ENCOUNTER — Telehealth: Payer: Self-pay | Admitting: Gynecologic Oncology

## 2017-04-10 NOTE — Telephone Encounter (Signed)
Spoke with Joylene John, NP - she has contacted Dr Denman George. If 2 options are Lovenox vs heparin drip, they are ok with Lovenox bridge. Assured them that we are using once daily dosing and pt's last Lovenox dose will be ~36 hours prior to procedure. She will relay this message back to pt and son. Also found [facility] pt at [facility].   Dr Denman George would like to speak with Dr Radford Pax about this plan still - I provided her with Dr Theodosia Blender pager # and will forward this message as an FYI.

## 2017-04-10 NOTE — Telephone Encounter (Signed)
Duplicate

## 2017-04-10 NOTE — Telephone Encounter (Signed)
I think it must have been Joylene John NP you spoke with, not me. thanks

## 2017-04-10 NOTE — Telephone Encounter (Signed)
Advised son that after discussion, the plan will be to have his mother take lovenox 80 mg once daily injections today, Saturday, and Sunday then none until after surgery.  They are advised to follow the instructions given to them by the [facility].    Called patient at home as well and advised her.  All questions answered.  Advised to call for any questions or concerns.

## 2017-04-10 NOTE — Progress Notes (Signed)
Rerequestred hgba1c results from office of Dr London Pepper for 2nd request.

## 2017-04-11 ENCOUNTER — Other Ambulatory Visit: Payer: Self-pay | Admitting: Cardiology

## 2017-04-13 NOTE — Progress Notes (Signed)
Requested for 3rd time HGBA1C results done 02/2017.  [facility]fice stated they will fax.

## 2017-04-13 NOTE — Progress Notes (Signed)
HGBA1C done 03/04/17 on chart from office of Dr Orland Mustard.

## 2017-04-14 ENCOUNTER — Ambulatory Visit (HOSPITAL_COMMUNITY): Payer: Medicare Other | Admitting: Certified Registered Nurse Anesthetist

## 2017-04-14 ENCOUNTER — Encounter (HOSPITAL_COMMUNITY): Payer: Self-pay | Admitting: Emergency Medicine

## 2017-04-14 ENCOUNTER — Encounter (HOSPITAL_COMMUNITY)
Admission: RE | Disposition: A | Discharge status: Home / Self Care | Payer: Self-pay | Source: Ambulatory Visit | Attending: Gynecologic Oncology

## 2017-04-14 ENCOUNTER — Ambulatory Visit (HOSPITAL_COMMUNITY)
Admission: RE | Admit: 2017-04-14 | Discharge: 2017-04-14 | Disposition: A | Discharge status: Home / Self Care | Payer: Medicare Other | Source: Ambulatory Visit | Attending: Gynecologic Oncology | Admitting: Gynecologic Oncology

## 2017-04-14 DIAGNOSIS — E119 Type 2 diabetes mellitus without complications: Secondary | ICD-10-CM | POA: Diagnosis not present

## 2017-04-14 DIAGNOSIS — N839 Noninflammatory disorder of ovary, fallopian tube and broad ligament, unspecified: Secondary | ICD-10-CM | POA: Insufficient documentation

## 2017-04-14 DIAGNOSIS — E559 Vitamin D deficiency, unspecified: Secondary | ICD-10-CM | POA: Insufficient documentation

## 2017-04-14 DIAGNOSIS — Z955 Presence of coronary angioplasty implant and graft: Secondary | ICD-10-CM | POA: Insufficient documentation

## 2017-04-14 DIAGNOSIS — I252 Old myocardial infarction: Secondary | ICD-10-CM | POA: Diagnosis not present

## 2017-04-14 DIAGNOSIS — Z79899 Other long term (current) drug therapy: Secondary | ICD-10-CM | POA: Insufficient documentation

## 2017-04-14 DIAGNOSIS — Z7901 Long term (current) use of anticoagulants: Secondary | ICD-10-CM | POA: Diagnosis not present

## 2017-04-14 DIAGNOSIS — Z7982 Long term (current) use of aspirin: Secondary | ICD-10-CM | POA: Insufficient documentation

## 2017-04-14 DIAGNOSIS — D490 Neoplasm of unspecified behavior of digestive system: Secondary | ICD-10-CM | POA: Diagnosis not present

## 2017-04-14 DIAGNOSIS — N838 Other noninflammatory disorders of ovary, fallopian tube and broad ligament: Secondary | ICD-10-CM

## 2017-04-14 DIAGNOSIS — E039 Hypothyroidism, unspecified: Secondary | ICD-10-CM | POA: Diagnosis not present

## 2017-04-14 DIAGNOSIS — Z7984 Long term (current) use of oral hypoglycemic drugs: Secondary | ICD-10-CM | POA: Diagnosis not present

## 2017-04-14 DIAGNOSIS — I110 Hypertensive heart disease with heart failure: Secondary | ICD-10-CM | POA: Diagnosis not present

## 2017-04-14 DIAGNOSIS — E785 Hyperlipidemia, unspecified: Secondary | ICD-10-CM | POA: Insufficient documentation

## 2017-04-14 DIAGNOSIS — I272 Pulmonary hypertension, unspecified: Secondary | ICD-10-CM | POA: Diagnosis not present

## 2017-04-14 DIAGNOSIS — D638 Anemia in other chronic diseases classified elsewhere: Secondary | ICD-10-CM | POA: Diagnosis not present

## 2017-04-14 DIAGNOSIS — C181 Malignant neoplasm of appendix: Secondary | ICD-10-CM | POA: Diagnosis not present

## 2017-04-14 DIAGNOSIS — I4892 Unspecified atrial flutter: Secondary | ICD-10-CM | POA: Diagnosis not present

## 2017-04-14 DIAGNOSIS — I5032 Chronic diastolic (congestive) heart failure: Secondary | ICD-10-CM | POA: Insufficient documentation

## 2017-04-14 DIAGNOSIS — K388 Other specified diseases of appendix: Secondary | ICD-10-CM | POA: Diagnosis not present

## 2017-04-14 DIAGNOSIS — I2510 Atherosclerotic heart disease of native coronary artery without angina pectoris: Secondary | ICD-10-CM | POA: Insufficient documentation

## 2017-04-14 HISTORY — PX: LAPAROSCOPIC APPENDECTOMY: SHX408

## 2017-04-14 LAB — PROTIME-INR
INR: 1.01
PROTHROMBIN TIME: 13.2 s (ref 11.4–15.2)

## 2017-04-14 LAB — TYPE AND SCREEN
ABO/RH(D): A NEG
Antibody Screen: NEGATIVE

## 2017-04-14 LAB — GLUCOSE, CAPILLARY
GLUCOSE-CAPILLARY: 91 mg/dL (ref 65–99)
Glucose-Capillary: 144 mg/dL — ABNORMAL HIGH (ref 65–99)

## 2017-04-14 SURGERY — APPENDECTOMY, LAPAROSCOPIC
Anesthesia: General

## 2017-04-14 MED ORDER — PROMETHAZINE HCL 25 MG/ML IJ SOLN: 6.2500 mg | INTRAMUSCULAR | Status: DC | PRN

## 2017-04-14 MED ORDER — BUPIVACAINE HCL (PF) 0.25 % IJ SOLN
INTRAMUSCULAR | Status: AC
  Filled 2017-04-14: qty 30

## 2017-04-14 MED ORDER — BUPIVACAINE HCL 0.25 % IJ SOLN
INTRAMUSCULAR | Status: DC | PRN
  Administered 2017-04-14: 20 mL

## 2017-04-14 MED ORDER — DEXTROSE 5 % IV SOLN
2.0000 g | Freq: Once | INTRAVENOUS | Status: AC
  Administered 2017-04-14: 2 g via INTRAVENOUS

## 2017-04-14 MED ORDER — ONDANSETRON HCL 4 MG/2ML IJ SOLN
INTRAMUSCULAR | Status: DC | PRN
  Administered 2017-04-14: 4 mg via INTRAVENOUS

## 2017-04-14 MED ORDER — PROPOFOL 10 MG/ML IV BOLUS
INTRAVENOUS | Status: AC
  Filled 2017-04-14: qty 20

## 2017-04-14 MED ORDER — LACTATED RINGERS IV SOLN
INTRAVENOUS | Status: DC | PRN
  Administered 2017-04-14 (×2): via INTRAVENOUS

## 2017-04-14 MED ORDER — CEFAZOLIN SODIUM-DEXTROSE 2-4 GM/100ML-% IV SOLN
INTRAVENOUS | Status: AC
  Filled 2017-04-14: qty 100

## 2017-04-14 MED ORDER — STERILE WATER FOR IRRIGATION IR SOLN
Status: DC | PRN
  Administered 2017-04-14: 1000 mL

## 2017-04-14 MED ORDER — PROPOFOL 10 MG/ML IV BOLUS
INTRAVENOUS | Status: DC | PRN
Start: 2017-04-14 — End: 2017-04-14
  Administered 2017-04-14: 80 mg via INTRAVENOUS
  Administered 2017-04-14: 40 mg via INTRAVENOUS

## 2017-04-14 MED ORDER — SUGAMMADEX SODIUM 200 MG/2ML IV SOLN
INTRAVENOUS | Status: DC | PRN
  Administered 2017-04-14: 125 mg via INTRAVENOUS

## 2017-04-14 MED ORDER — LIDOCAINE 2% (20 MG/ML) 5 ML SYRINGE
INTRAMUSCULAR | Status: DC | PRN
Start: 2017-04-14 — End: 2017-04-14
  Administered 2017-04-14: 60 mg via INTRAVENOUS

## 2017-04-14 MED ORDER — IBUPROFEN 600 MG PO TABS: 600.0000 mg | ORAL_TABLET | Freq: Four times a day (QID) | ORAL | 0 refills | Status: DC | PRN

## 2017-04-14 MED ORDER — HYDROMORPHONE HCL 1 MG/ML IJ SOLN
INTRAMUSCULAR | Status: AC
  Administered 2017-04-14: 0.25 mg via INTRAVENOUS
  Filled 2017-04-14: qty 1

## 2017-04-14 MED ORDER — ACETAMINOPHEN 10 MG/ML IV SOLN
INTRAVENOUS | Status: AC
  Filled 2017-04-14: qty 100

## 2017-04-14 MED ORDER — DEXAMETHASONE SODIUM PHOSPHATE 10 MG/ML IJ SOLN
INTRAMUSCULAR | Status: DC | PRN
  Administered 2017-04-14: 5 mg via INTRAVENOUS

## 2017-04-14 MED ORDER — CEFOTETAN DISODIUM-DEXTROSE 2-2.08 GM-%(50ML) IV SOLR
INTRAVENOUS | Status: AC
  Filled 2017-04-14: qty 50

## 2017-04-14 MED ORDER — CEFAZOLIN SODIUM-DEXTROSE 2-4 GM/100ML-% IV SOLN
2.0000 g | INTRAVENOUS | Status: AC
  Administered 2017-04-14: 2 g via INTRAVENOUS

## 2017-04-14 MED ORDER — ROCURONIUM BROMIDE 10 MG/ML (PF) SYRINGE
PREFILLED_SYRINGE | INTRAVENOUS | Status: DC | PRN
Start: 2017-04-14 — End: 2017-04-14
  Administered 2017-04-14: 10 mg via INTRAVENOUS
  Administered 2017-04-14: 40 mg via INTRAVENOUS

## 2017-04-14 MED ORDER — BUPIVACAINE LIPOSOME 1.3 % IJ SUSP
20.0000 mL | Freq: Once | INTRAMUSCULAR | Status: AC
  Administered 2017-04-14: 20 mL
  Filled 2017-04-14: qty 20

## 2017-04-14 MED ORDER — FENTANYL CITRATE (PF) 100 MCG/2ML IJ SOLN
INTRAMUSCULAR | Status: DC | PRN
  Administered 2017-04-14: 50 ug via INTRAVENOUS

## 2017-04-14 MED ORDER — ACETAMINOPHEN 10 MG/ML IV SOLN
1000.0000 mg | Freq: Once | INTRAVENOUS | Status: AC
  Administered 2017-04-14: 1000 mg via INTRAVENOUS

## 2017-04-14 MED ORDER — LACTATED RINGERS IR SOLN
Status: DC | PRN
  Administered 2017-04-14: 1000 mL

## 2017-04-14 MED ORDER — FENTANYL CITRATE (PF) 250 MCG/5ML IJ SOLN
INTRAMUSCULAR | Status: AC
  Filled 2017-04-14: qty 5

## 2017-04-14 MED ORDER — ACETAMINOPHEN 500 MG PO TABS: 500.0000 mg | ORAL_TABLET | Freq: Four times a day (QID) | ORAL | 0 refills | Status: DC | PRN

## 2017-04-14 MED ORDER — HYDROMORPHONE HCL 1 MG/ML IJ SOLN
0.2500 mg | INTRAMUSCULAR | Status: DC | PRN
  Administered 2017-04-14 (×3): 0.25 mg via INTRAVENOUS

## 2017-04-14 SURGICAL SUPPLY — 46 items
BAG LAPAROSCOPIC 12 15 PORT 16 (BASKET) IMPLANT
BAG RETRIEVAL 12/15 (BASKET)
CHLORAPREP W/TINT 26ML (MISCELLANEOUS) ×3 IMPLANT
COVER BACK TABLE 60X90IN (DRAPES) ×3 IMPLANT
COVER TIP SHEARS 8 DVNC (MISCELLANEOUS) ×2 IMPLANT
COVER TIP SHEARS 8MM DA VINCI (MISCELLANEOUS) ×1
CUTTER FLEX LINEAR 45M (STAPLE) ×3 IMPLANT
DERMABOND ADVANCED (GAUZE/BANDAGES/DRESSINGS) ×1
DERMABOND ADVANCED .7 DNX12 (GAUZE/BANDAGES/DRESSINGS) ×2 IMPLANT
DRAPE ARM DVNC X/XI (DISPOSABLE) ×8 IMPLANT
DRAPE COLUMN DVNC XI (DISPOSABLE) ×2 IMPLANT
DRAPE DA VINCI XI ARM (DISPOSABLE) ×4
DRAPE DA VINCI XI COLUMN (DISPOSABLE) ×1
DRAPE SHEET LG 3/4 BI-LAMINATE (DRAPES) ×3 IMPLANT
DRAPE SURG IRRIG POUCH 19X23 (DRAPES) ×3 IMPLANT
ELECT PENCIL ROCKER SW 15FT (MISCELLANEOUS) ×3 IMPLANT
GLOVE BIO SURGEON STRL SZ 6 (GLOVE) ×12 IMPLANT
GLOVE BIO SURGEON STRL SZ 6.5 (GLOVE) ×6 IMPLANT
GOWN STRL REUS W/ TWL LRG LVL3 (GOWN DISPOSABLE) ×6 IMPLANT
GOWN STRL REUS W/TWL LRG LVL3 (GOWN DISPOSABLE) ×3
HOLDER FOLEY CATH W/STRAP (MISCELLANEOUS) ×3 IMPLANT
IRRIG SUCT STRYKERFLOW 2 WTIP (MISCELLANEOUS) ×3
IRRIGATION SUCT STRKRFLW 2 WTP (MISCELLANEOUS) ×2 IMPLANT
MANIPULATOR UTERINE 4.5 ZUMI (MISCELLANEOUS) ×3 IMPLANT
MARKER SKIN DUAL TIP RULER LAB (MISCELLANEOUS) ×3 IMPLANT
OBTURATOR OPTICAL STANDARD 8MM (TROCAR) ×1
OBTURATOR OPTICAL STND 8 DVNC (TROCAR) ×2
OBTURATOR OPTICALSTD 8 DVNC (TROCAR) ×2 IMPLANT
PACK ROBOT GYN CUSTOM WL (TRAY / TRAY PROCEDURE) ×3 IMPLANT
PAD POSITIONING PINK XL (MISCELLANEOUS) ×3 IMPLANT
POUCH SPECIMEN RETRIEVAL 10MM (ENDOMECHANICALS) ×3 IMPLANT
RELOAD STAPLE TA45 3.5 REG BLU (ENDOMECHANICALS) ×3 IMPLANT
SCISSORS LAP 5X35 DISP (ENDOMECHANICALS) ×3 IMPLANT
SEAL CANN UNIV 5-8 DVNC XI (MISCELLANEOUS) ×8 IMPLANT
SEAL XI 5MM-8MM UNIVERSAL (MISCELLANEOUS) ×4
SET TRI-LUMEN FLTR TB AIRSEAL (TUBING) ×3 IMPLANT
SHEARS HARMONIC ACE PLUS 36CM (ENDOMECHANICALS) ×3 IMPLANT
SOLUTION ELECTROLUBE (MISCELLANEOUS) ×3 IMPLANT
SUT NOVA NAB DX-16 0-1 5-0 T12 (SUTURE) ×6 IMPLANT
SUT VIC AB 0 CT1 27 (SUTURE)
SUT VIC AB 0 CT1 27XBRD ANTBC (SUTURE) IMPLANT
TOWEL OR NON WOVEN STRL DISP B (DISPOSABLE) ×3 IMPLANT
TRAP SPECIMEN MUCOUS 40CC (MISCELLANEOUS) ×3 IMPLANT
TRAY FOLEY W/METER SILVER 16FR (SET/KITS/TRAYS/PACK) ×3 IMPLANT
UNDERPAD 30X30 (UNDERPADS AND DIAPERS) ×6 IMPLANT
WATER STERILE IRR 1000ML POUR (IV SOLUTION) IMPLANT

## 2017-04-14 NOTE — Progress Notes (Signed)
Spoke with Dr Denman George and she states that the patient is to be discharge home.  Reported discharge instructions to Otelia Sergeant, RN.

## 2017-04-14 NOTE — Anesthesia Postprocedure Evaluation (Signed)
Anesthesia Post Note  Patient: Ana [name]  Procedure(s) Performed: APPENDECTOMY LAPAROSCOPIC (N/A )     Patient location during evaluation: PACU Anesthesia Type: General Level of consciousness: awake and alert Pain management: pain level controlled Vital Signs Assessment: post-procedure vital signs reviewed and stable Respiratory status: spontaneous breathing, nonlabored ventilation, respiratory function stable and patient connected to nasal cannula oxygen Cardiovascular status: blood pressure returned to baseline and stable Postop Assessment: no apparent nausea or vomiting Anesthetic complications: no    Last Vitals:  Vitals:   04/14/17 0945 04/14/17 1000  BP: (!) 114/52 (!) 126/52  Pulse: 66 65  Resp: 15 13  Temp: (!) 34.6 C (!) 34.7 C  SpO2: 100% 96%    Last Pain:  Vitals:   04/14/17 0621  TempSrc:   PainSc: 0-No pain                 Lliam Hoh S

## 2017-04-14 NOTE — Progress Notes (Signed)
Patient out of stretcher and to BR with gait belt, walker, and one person assist. Able to void in bathroom. Incontinent of urine on floor beside stretcher. Back to recliner. O2 sats in high 90's after ambulation on room air.

## 2017-04-14 NOTE — Transfer of Care (Signed)
Immediate Anesthesia Transfer of Care Note  Patient: Ana [name]  Procedure(s) Performed: APPENDECTOMY LAPAROSCOPIC (N/A )  Patient Location: PACU  Anesthesia Type:General  Level of Consciousness: sedated, patient cooperative and responds to stimulation  Airway & Oxygen Therapy: Patient Spontanous Breathing and Patient connected to face mask oxygen  Post-op Assessment: Report given to RN and Post -op Vital signs reviewed and stable  Post vital signs: Reviewed and stable  Last Vitals:  Vitals:   04/14/17 0545  BP: (!) 146/55  Pulse: 82  Resp: 16  Temp: 36.9 C  SpO2: 99%    Last Pain:  Vitals:   04/14/17 0621  TempSrc:   PainSc: 0-No pain      Patients Stated Pain Goal: 4 (60/67/70 3403)  Complications: No apparent anesthesia complications

## 2017-04-14 NOTE — Anesthesia Preprocedure Evaluation (Addendum)
Anesthesia Evaluation  Patient identified by MRN, date of birth, ID band Patient awake    Reviewed: Allergy & Precautions, NPO status , Patient's Chart, lab work & pertinent test results  Airway Mallampati: II  TM Distance: >3 FB Neck ROM: Full    Dental no notable dental hx.    Pulmonary neg pulmonary ROS,    Pulmonary exam normal breath sounds clear to auscultation       Cardiovascular hypertension, + CAD, + Past MI and +CHF   Rhythm:Irregular Rate:Normal     Neuro/Psych negative neurological ROS  negative psych ROS   GI/Hepatic negative GI ROS, Neg liver ROS,   Endo/Other  diabetesHypothyroidism   Renal/GU negative Renal ROS  negative genitourinary   Musculoskeletal negative musculoskeletal ROS (+)   Abdominal   Peds negative pediatric ROS (+)  Hematology negative hematology ROS (+)   Anesthesia Other Findings   Reproductive/Obstetrics negative OB ROS                            Anesthesia Physical Anesthesia Plan  ASA: III  Anesthesia Plan: General   Post-op Pain Management:    Induction: Intravenous  PONV Risk Score and Plan: 2 and Ondansetron and Dexamethasone  Airway Management Planned: Oral ETT  Additional Equipment:   Intra-op Plan:   Post-operative Plan: Extubation in OR  Informed Consent: I have reviewed the patients History and Physical, chart, labs and discussed the procedure including the risks, benefits and alternatives for the proposed anesthesia with the patient or authorized representative who has indicated his/her understanding and acceptance.   Dental advisory given  Plan Discussed with: CRNA and Surgeon  Anesthesia Plan Comments:         Anesthesia Quick Evaluation

## 2017-04-14 NOTE — Op Note (Signed)
OPERATIVE NOTE  Date: 04/14/17  Preoperative Diagnosis: right ovarian mass   Postoperative Diagnosis:  Appendiceal mass  Procedure(s) Performed: Laparoscopic appendectomy  Surgeon: Otho Ket, MD Assistant surgeon: Everitt Amber, MD  Assistant Surgeon: Lahoma Crocker M.D. (an MD assistant was necessary for tissue manipulation, retraction and positioning due to the complexity of the case and hospital policies).   Anesthesia: Gen. endotracheal.  Specimens: appendix  Estimated Blood Loss: <25 mL. Blood Replacement: None  Complications: none  Indication for Procedure:  Abdominal pain, imaging revealing 8cm solid right ovarian mass.   Operative Findings: normal ovaries and tubes bilaterally (ovaries <2cm). 6cm normal uterus. No free fluid. Normal upper abdomen. Intestines run and grossly normal with the exception of an 8cm tubular solid, firm mass filling the appendix to the base of the cecum (consistent with the [place] abnormality).  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  Procedure: The patient's taken to the operating room and placed under general endotracheal anesthesia testing difficulty. She is placed in a dorsolithotomy position and cervical acromial pad was placed. The arms were tucked with care taken to pad the olecranon process. And prepped and draped in usual sterile fashion. A uterine manipulator (zumi) was placed vaginally. A 84mm incision was made in the left upper quadrant palmer's point and a 5 mm Optiview trocar used to enter the abdomen under direct visualization. With entry into the abdomen and then maintenance of 15 mm of mercury the patient was placed in Trendelenburg position. An incision was made in the umbilicus and a 03ES trochar was placed through this site. Two incisions were made lateral to the umbilical incision in the left and right abdomen measuring 47mm. These incisions were made approximately  10 cm lateral to the umbilical incision. 8 mm robotic trochars were inserted.   The abdomen was inspected as was the pelvis. The ovaries were grossly normal. The only apparent mass was the appendiceal mass. There was no upper abdominal disease, ascites or intraperitoneal mucin.  Please refer to Dr Arnell Sieving op note for complete details.  The left upper quadrant incision was extended for removal of the contained appendiceal specimen. It was removed with ease after transversely opening the fascia. The left superior epigastric vessel was sealed with cautery.   The fascial incision in the left upper quadrant was closed by Dr Leonel Ramsay with permanent suture.  Exparel with marcaine was infiltrated into all incisions.  The ports were all remove.  All incisions were closed with a running subcuticular Monocryl suture. Dermabond was applied. An interrupted monocryl suture closed a vaginal mucosal area of bleeding.  Sponge, lap and needle counts were correct x 3.    The patient had sequential compression devices for VTE prophylaxis.         Disposition: PACU -stable          Condition: stable  [name], MD

## 2017-04-14 NOTE — H&P (View-Only) (Signed)
Follow-up Note: Gyn-Onc  [name] 83 y.o. female  CC:  Chief Complaint  Patient presents with  . Adnexal mass    HPI: Patient is seen at the request of Dr. Deatra Ina. Primary cardiologist Dr. Golden Hurter. Primary physician Dr. London Pepper.  SHALAUNDA WEATHERHOLTZ is a 83 y.o. female with a hx of permanent atrial flutter, chronic diastolic CHF, pulmonary HTN, minimal mitral stenosis (echo 09/2012), HTN, HL and anemia of chronic disease. She has been kept on coumadin instead of NOAC due to valvular heart dsz. She also has a history of ASCAD with  multivessel CAD s/p PCI with BMS to the LAD and CFX and subsequent staged PCI with BMS to the RCA.   She states that in May she began having episodes of abdominal pain. The first episode awoke early in the morning and she describes as a "attack". She called 911 and went to the emergency room via EMS. She states that she was diagnosed with a urinary tract infection which is common for her and she took antibody which really did not help. She discussed with her son who said that it sounded like kidney stones which she has suffered from. She had subsequently 2 other episodes similar and ultimately underwent an ultrasound as he felt that this was most likely consistent with kidney stones. Ultrasound revealed the uterus to be 6.7 x 4.8 x 2.8 cm with small calcifications. There were no large fibroids. The endometrial thickness was 3 mm but was normal. There was a 1.6 mm focal nodule most consistent with a benign polyp. However, there was an 8.5 x 4.4 x 4 cm complex cystic and solid adnexal mass. She was seen by Dr. Deatra Ina and had a CA-125 performed that was 28. She subsequently was referred to [place] for evaluation and management.  She has 2 different types of pelvic pain. She's has this pain that is like an attack and then she still has some suprapubic discomfort and which she describes as dysuria which feels like "boiling water". She is once again on antibiotics that she  does not recall the name. She denies any history of change in her bowels are she stay she's long history of constipation. She's not sure of its actually gotten any worse lately. She does walk with a walker. She denies any chest pain or shortness of breath. Her last colonoscopy was about 10 years ago. She has stopped having mammograms.  Review of Systems: Constitutional: Denies unintentional weight loss or weight gain. Skin: No rash Cardiovascular: No chest pain, shortness of breath, or edema. She is not going up stairs but she's able to do her grocery shopping with her walker.  Pulmonary: No cough Gastro Intestinal: Reporting intermittent lower abdominal soreness with episodes of severe abdominal pain.  No nausea, vomiting, +constipation. No bright red blood per rectum or change in bowel movement.  Genitourinary: No frequency, urgency, + dysuria. She stay she's incontinent all the time and wears pads. This is no change. Denies vaginal bleeding and discharge.  Musculoskeletal: No pain.  Neurologic: Uses a walker   Current Meds:  Outpatient Encounter Prescriptions as of 04/02/2017  Medication Sig  . acetaminophen (TYLENOL) 500 MG tablet Take 500 mg by mouth every 8 (eight) hours as needed for mild pain, moderate pain, fever or headache.  Marland Kitchen amLODipine (NORVASC) 5 MG tablet TAKE ONE TABLET BY MOUTH ONCE DAILY.  Marland Kitchen aspirin EC 81 MG tablet Take 81 mg by mouth daily.  . Cholecalciferol (VITAMIN D3) 2000 UNITS TABS  Take 2,000 Units by mouth daily.   . Ciprofloxacin (CIPRO PO) Take 250 mg by mouth 2 (two) times daily.   . Coenzyme Q10 200 MG capsule Take 200 mg by mouth daily.  Marland Kitchen levothyroxine (SYNTHROID, LEVOTHROID) 88 MCG tablet Take 88 mcg by mouth daily before breakfast.  . losartan (COZAAR) 100 MG tablet TAKE ONE TABLET BY MOUTH ONCE DAILY  . metFORMIN (GLUCOPHAGE) 1000 MG tablet Take 1 tablet (1,000 mg total) by mouth 2 (two) times daily with a meal.  . Omega-3 Krill Oil 500 MG CAPS Take 1  capsule by mouth daily.   . polyethylene glycol (MIRALAX / GLYCOLAX) packet Take 17 g by mouth daily as needed.  . warfarin (COUMADIN) 2.5 MG tablet Take 2.5 mg by mouth See admin instructions. Takes 2.5mg  on Monday, Wednesday, Friday, and Saturday; then 1.25mg  on Sunday, Tuesday, and Thursday   No facility-administered encounter medications on file as of 04/02/2017.     Allergy:  Allergies  Allergen Reactions  . Welchol [Colesevelam Hcl] Nausea Only  . Zetia [Ezetimibe] Other (See Comments)    Edema   . Statins   . Crestor [Rosuvastatin] Other (See Comments)    Unknown  . Zocor [Simvastatin] Other (See Comments)    Muscle weakness    Social Hx:   Social History   Social History  . Marital status: Married    Spouse name: N/A  . Number of children: N/A  . Years of education: N/A   Occupational History  . Not on file.   Social History Main Topics  . Smoking status: Never Smoker  . Smokeless tobacco: Never Used  . Alcohol use No  . Drug use: No  . Sexual activity: Not on file   Other Topics Concern  . Not on file   Social History Narrative  . No narrative on file    Past Surgical Hx:  Past Surgical History:  Procedure Laterality Date  . CATARACT EXTRACTION    . LEFT HEART CATHETERIZATION WITH CORONARY ANGIOGRAM N/A 01/17/2014   Procedure: LEFT HEART CATHETERIZATION WITH CORONARY ANGIOGRAM;  Surgeon: Troy Sine, MD;  Location: [facility] CATH LAB;  Service: Cardiovascular;  Laterality: N/A;  . PERCUTANEOUS CORONARY STENT INTERVENTION (PCI-S)  01/17/2014   Procedure: PERCUTANEOUS CORONARY STENT INTERVENTION (PCI-S);  Surgeon: Troy Sine, MD;  Location: [facility] CATH LAB;  Service: Cardiovascular;;  . PERCUTANEOUS CORONARY STENT INTERVENTION (PCI-S) N/A 01/19/2014   Procedure: PERCUTANEOUS CORONARY STENT INTERVENTION (PCI-S);  Surgeon: Leonie Man, MD;  Location: [facility] CATH LAB;  Service: Cardiovascular;  Laterality: N/A;    Past Medical Hx:  Past Medical History:   Diagnosis Date  . Anemia of chronic disease   . Atrial flutter ([place])   . Chronic diastolic CHF (congestive heart failure) ([place])   . Diabetes mellitus without complication ([place])   . Dizziness 02/13/2015  . Hyperlipidemia   . Hypertension   . Hypothyroidism   . MI, old 07/18/2014  . Mitral stenosis   . Pulmonary HTN ([place])    no evidence on last echo 2015  . Vitamin D deficiency disease     Oncology Hx:   No history exists.    Family Hx:  Family History  Problem Relation Age of Onset  . Cancer Mother        ent  . Cirrhosis Father   . Heart attack Sister   . Heart disease Sister   . Cancer Sister 30       breast cancer  . Diabetes Brother   .  Cancer Other 30       breast    Vitals:  Blood pressure (!) 153/62, pulse 71, temperature 97.8 F (36.6 C), temperature source Oral, resp. rate 18, weight 121 lb (54.9 kg), SpO2 100 %.  Physical Exam:  Well-nourished well-developed elderly female in no acute distress.  Neck: Supple, no lymphadenopathy, no thyromegaly.  Lungs: Clear to auscultation bilaterally.  Cardiac: Regular rate and rhythm. She's not and atrial fibrillation. She has a 2/6 systolic ejection murmur.  Abdomen: Soft, nontender, nondistended. There are no palpable masses or hepatosplenomegaly. There is no fluid wave.  Groins: No lymphadenopathy.  Extremities: Trace edema equal bilaterally.  Pelvic: External genitalia within normal limits. Bimanual examination the cervix is palpably normal. The patient had excruciating pain with palpation of the cervix. She then states she has abdominal pain. An abdominal mass is not readily appreciated on vaginal examination. However, on rectovaginal examination which does not reproduce her pain there is a mass that fills the posterior pelvis measuring approximately 7-8 cm. It is smooth. I cannot assess mobility secondary to patient's voluntary guarding. Her pain is distractible.  Assessment/Plan: 83 year old with multiple  medical issues primarily cardiac related who has a cystic and solid mass that I do not believe represents a malignancy. 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 She and her son both agree that he would like to move forward with future discussion of surgery so that we could potentially perform it in a minimally invasive fashion.  We will contact Dr. Radford Pax her cardiologist regarding perioperative risk stratification and for assistance of management with her anticoagulation. We will need to see if she is comfortable with [place] stopping the Coumadin and not proceeding with a bridge or based on her [place] score if she would like [place] temperature perioperative period the patient also has cardiac stents. She's had no recurrent episodes or recent episodes of chest pain and her congestive heart failure seems to be fairly well compensated.  She is tentatively scheduled for a laparoscopic bilateral salpingo-oophorectomy on October 23. This should allow sufficient time to give perioperative risk stratification and an opinion from Dr. Radford Pax. We will also ask Dr Radford Pax if it is appropriate to stop her Coumadin preop or if she needs a Lovenox bridge.  [name], MD 04/02/2017, 4:51 PM

## 2017-04-14 NOTE — Op Note (Addendum)
Patient Name:           Ana [name]   Date of Surgery:        04/14/2017  Pre op Diagnosis:      Right ovarian mass  Post op Diagnosis:    Appendiceal mass  Procedure:                 Laparoscopic appendectomy  Surgeon:                     Edsel Petrin. Dalbert Batman, M.D., FACS  Assistant:                      Dr. Everitt Amber   Indication for Assistant: Unexpected intraoperative findings, unusual exposure  Operative Indications:   The patient was admitted to the hospital electively and brought to the operating room electively by Dr. Everitt Amber for laparoscopic possible robotic oophorectomy for right ovarian mass.  Once the procedure was initiated and Dr. Denman George placed all the trochars pneumoperitoneum was created.  She found that there was enlarged fusiform mass of the appendix.  There was no uterine or adnexal disease.  4 quadrant inspection revealed no ascites nodules or intraperitoneal mucin.  I was called to the operating room to manage the appendiceal mass  Operative Findings:       There was a fusiform solid mass of the appendix.  It was discolored.  There is no exudate or peritoneal nodules.  There were lots of soft adhesions of the appendix to the terminal ileum but we were able to separate this and transect the appendix at its base without injuring the ileocecal valve.  Dr. 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 Denman George placed for trochars and explored the abdomen.  This will be dictated separately.  I was called to the operating room.     The appendix was very firm and hard and was a little bit difficult to manipulate but we were able to mobilize it from lateral to medial.  Using cold scissors and some harmonic scalpel I slowly tease the terminal ileum away from the appendix.  I took down the mesentery of the appendix and slowly  dissected all the way back to the cecum.  The appendix inserted into the cecum and the appendix appeared normal in this location.  I used an Endo GIA stapling device with a blue load and pass this through one of the larger trochars.  This was positioned so as to take a small cuff of the cecum.  Staples closed and fired and removed.  The staple line looked very good and was intact without bleeding at the completion of the case.  The appendix was placed in a specimen bag.  It was so large that I had to open up the left upper quadrant trocar site transversely and divided the muscle a little bit.  Bleeders were controlled with cautery.  The appendix was placed in a specimen bag and then removed once I had the incision large enough.  The fascia was closed in a single layer with interrupted sutures of #1 Novafil and the skin closed with subcuticular sutures by Dr. Denman George.    At this point the case I scrubbed out to assist with another procedure in another room.  Dr. Denman George was  going to discuss this with the patient's family and she will be referred back to me for postop follow-up in my office once the final pathology is returned.  EBL for my  part of the procedure was 10-20 mL.  Counts correct.  Complications none.     Edsel Petrin. Dalbert Batman, M.D., FACS General and Minimally Invasive Surgery Breast and Colorectal Surgery  04/14/2017 10:13 AM

## 2017-04-14 NOTE — Discharge Instructions (Signed)
04/14/2017   Activity: 1. Be up and out of the bed during the day.  Take a nap if needed.  You may walk up steps but be careful and use the hand rail.  Stair climbing will tire you more than you think, you may need to stop part way and rest.   2. No lifting or straining for 6 weeks.  3. No driving for 2 weeks. You may be a passenger in a car.  Do Not drive if you are taking narcotic pain medicine.  4. Shower daily.  Use soap and water on your incision and pat dry; don't rub.   5. No sexual activity and nothing in the vagina for 4 weeks.  Medications:  - Take ibuprofen and tylenol first line for pain control. Take these regularly (every 6 hours) to decrease the build up of pain.  - If necessary, for severe pain not relieved by ibuprofen, contact Dr Serita Grit office at [phone] not resume coumadin until Thursday, 04/16/17. No more Lovenox injections.  Diet: 1. Low sodium Heart Healthy Diet is recommended.  2. It is safe to use a laxative if you have difficulty moving your bowels.   Wound Care: 1. Keep clean and dry.  Shower daily.  Reasons to call the Doctor:   Fever - Oral temperature greater than 100.4 degrees Fahrenheit  Foul-smelling vaginal discharge  Difficulty urinating  Nausea and vomiting  Increased pain at the site of the incision that is unrelieved with pain medicine.  Difficulty breathing with or without chest pain  New calf pain especially if only on one side  Sudden, continuing increased vaginal bleeding with or without clots.   Follow-up: 1. See Dr Lisabeth Register from [facility] in 3 weeks.  Contacts: For questions or concerns you should contact:  Dr. Everitt Amber at [phone] After hours and on week-ends call [phone] and ask to speak to the physician on call for Gynecologic Oncology    General Anesthesia, Adult, Care After These instructions provide you with information about caring for yourself after your  procedure. Your health care provider may also give you more specific instructions. Your treatment has been planned according to current medical practices, but problems sometimes occur. Call your health care provider if you have any problems or questions after your procedure. What can I expect after the procedure? After the procedure, it is common to have:  Vomiting.  A sore throat.  Mental slowness.  It is common to feel:  Nauseous.  Cold or shivery.  Sleepy.  Tired.  Sore or achy, even in parts of your body where you did not have surgery.  Follow these instructions at home: For at least 24 hours after the procedure:  Do not: ? Participate in activities where you could fall or become injured. ? Drive. ? Use heavy machinery. ? Drink alcohol. ? Take sleeping pills or medicines that cause drowsiness. ? Make important decisions or sign legal documents. ? Take care of children on your own.  Rest. Eating and drinking  If you vomit, drink water, juice, or soup when you can drink without vomiting.  Drink enough fluid to keep your urine clear or pale yellow.  Make sure you have little or no nausea before eating solid foods.  Follow the diet recommended by your health care provider. General instructions  Have a responsible adult stay with you until you are awake and alert.  Return to your normal activities as told by your  health care provider. Ask your health care provider what activities are safe for you.  Take over-the-counter and prescription medicines only as told by your health care provider.  If you smoke, do not smoke without supervision.  Keep all follow-up visits as told by your health care provider. This is important. Contact a health care provider if:  You continue to have nausea or vomiting at home, and medicines are not helpful.  You cannot drink fluids or start eating again.  You cannot urinate after 8-12 hours.  You develop a skin rash.  You have  fever.  You have increasing redness at the site of your procedure. Get help right away if:  You have difficulty breathing.  You have chest pain.  You have unexpected bleeding.  You feel that you are having a life-threatening or urgent problem. This information is not intended to replace advice given to you by your health care provider. Make sure you discuss any questions you have with your health care provider. Document Released: 09/15/2000 Document Revised: 11/12/2015 Document Reviewed: 05/24/2015 Elsevier Interactive Patient Education  [employer].

## 2017-04-14 NOTE — Interval H&P Note (Signed)
History and Physical Interval Note:  04/14/2017 7:06 AM  Ana [name]  has presented today for surgery, with the diagnosis of OVARIAN MASS  The various methods of treatment have been discussed with the patient and family. After consideration of risks, benefits and other options for treatment, the patient has consented to  Procedure(s): XI ROBOTIC [place] (Bilateral) as a surgical intervention .  The patient's history has been reviewed, patient examined, no change in status, stable for surgery.  I have reviewed the patient's chart and labs.  Questions were answered to the patient's satisfaction.     [name]

## 2017-04-14 NOTE — Anesthesia Procedure Notes (Signed)
Procedure Name: Intubation Performed by: Kinnedy Mongiello J Pre-anesthesia Checklist: Patient identified, Emergency Drugs available, Suction available, Patient being monitored and Timeout performed Patient Re-evaluated:Patient Re-evaluated prior to induction Oxygen Delivery Method: Circle system utilized Preoxygenation: Pre-oxygenation with 100% oxygen Induction Type: IV induction Ventilation: Mask ventilation without difficulty Laryngoscope Size: Mac and 3 Grade View: Grade I Tube type: Oral Tube size: 7.0 mm Number of attempts: 1 Airway Equipment and Method: Stylet Placement Confirmation: ETT inserted through vocal cords under direct vision,  positive ETCO2,  CO2 detector and breath sounds checked- equal and bilateral Secured at: 21 cm Tube secured with: Tape Dental Injury: Teeth and Oropharynx as per pre-operative assessment        

## 2017-04-15 ENCOUNTER — Telehealth: Payer: Self-pay | Admitting: *Deleted

## 2017-04-15 NOTE — Telephone Encounter (Signed)
Called and spoke with the patient, informed her that the appt on November 12th with Dr. Denman George is canceled and will not be rescheduled. Appt made for her to follow up with Dr. Dalbert Batman on November 13th at 1:45pm. She will call his office if the appt doesn't work.

## 2017-04-20 ENCOUNTER — Ambulatory Visit (INDEPENDENT_AMBULATORY_CARE_PROVIDER_SITE_OTHER): Payer: Medicare Other | Admitting: *Deleted

## 2017-04-20 DIAGNOSIS — Z5181 Encounter for therapeutic drug level monitoring: Secondary | ICD-10-CM

## 2017-04-20 DIAGNOSIS — I4819 Other persistent atrial fibrillation: Secondary | ICD-10-CM

## 2017-04-20 DIAGNOSIS — Z7901 Long term (current) use of anticoagulants: Secondary | ICD-10-CM

## 2017-04-20 DIAGNOSIS — I480 Paroxysmal atrial fibrillation: Secondary | ICD-10-CM | POA: Diagnosis not present

## 2017-04-20 LAB — POCT INR: INR: 1.2

## 2017-04-24 ENCOUNTER — Ambulatory Visit (INDEPENDENT_AMBULATORY_CARE_PROVIDER_SITE_OTHER): Payer: Medicare Other | Admitting: *Deleted

## 2017-04-24 DIAGNOSIS — I480 Paroxysmal atrial fibrillation: Secondary | ICD-10-CM

## 2017-04-24 DIAGNOSIS — Z5181 Encounter for therapeutic drug level monitoring: Secondary | ICD-10-CM | POA: Diagnosis not present

## 2017-04-24 DIAGNOSIS — I4819 Other persistent atrial fibrillation: Secondary | ICD-10-CM

## 2017-04-24 DIAGNOSIS — Z7901 Long term (current) use of anticoagulants: Secondary | ICD-10-CM | POA: Diagnosis not present

## 2017-04-24 DIAGNOSIS — I481 Persistent atrial fibrillation: Secondary | ICD-10-CM | POA: Diagnosis not present

## 2017-04-24 LAB — POCT INR: INR: 2.2

## 2017-05-04 ENCOUNTER — Ambulatory Visit: Payer: Medicare Other | Admitting: Gynecologic Oncology

## 2017-05-07 ENCOUNTER — Ambulatory Visit (INDEPENDENT_AMBULATORY_CARE_PROVIDER_SITE_OTHER): Payer: Medicare Other | Admitting: *Deleted

## 2017-05-07 DIAGNOSIS — I481 Persistent atrial fibrillation: Secondary | ICD-10-CM | POA: Diagnosis not present

## 2017-05-07 DIAGNOSIS — Z5181 Encounter for therapeutic drug level monitoring: Secondary | ICD-10-CM

## 2017-05-07 DIAGNOSIS — Z7901 Long term (current) use of anticoagulants: Secondary | ICD-10-CM

## 2017-05-07 DIAGNOSIS — I4819 Other persistent atrial fibrillation: Secondary | ICD-10-CM

## 2017-05-07 LAB — POCT INR: INR: 1.7

## 2017-05-07 NOTE — Patient Instructions (Signed)
Today Nov 15th take 1 tablet then continue taking 1 tablet daily except 1/2 tablet on Sunday Tuesday and Thursday  Recheck INR 1n 2 weeks. Call with any problems  [phone].

## 2017-05-20 ENCOUNTER — Ambulatory Visit (INDEPENDENT_AMBULATORY_CARE_PROVIDER_SITE_OTHER): Payer: Medicare Other | Admitting: *Deleted

## 2017-05-20 DIAGNOSIS — Z5181 Encounter for therapeutic drug level monitoring: Secondary | ICD-10-CM | POA: Diagnosis not present

## 2017-05-20 DIAGNOSIS — Z7901 Long term (current) use of anticoagulants: Secondary | ICD-10-CM

## 2017-05-20 DIAGNOSIS — I4819 Other persistent atrial fibrillation: Secondary | ICD-10-CM

## 2017-05-20 DIAGNOSIS — I481 Persistent atrial fibrillation: Secondary | ICD-10-CM

## 2017-05-20 LAB — POCT INR: INR: 1.6

## 2017-05-20 NOTE — Patient Instructions (Signed)
Today take 1 and 1/2 tablets then start taking 1 tablet daily except 1/2 tablet on Tuesday and Thursday.  Recheck INR in 2 weeks. Call with any problems  [phone].

## 2017-06-10 ENCOUNTER — Ambulatory Visit (INDEPENDENT_AMBULATORY_CARE_PROVIDER_SITE_OTHER): Payer: Medicare Other | Admitting: *Deleted

## 2017-06-10 DIAGNOSIS — Z7901 Long term (current) use of anticoagulants: Secondary | ICD-10-CM

## 2017-06-10 DIAGNOSIS — I481 Persistent atrial fibrillation: Secondary | ICD-10-CM | POA: Diagnosis not present

## 2017-06-10 DIAGNOSIS — I4819 Other persistent atrial fibrillation: Secondary | ICD-10-CM

## 2017-06-10 DIAGNOSIS — Z5181 Encounter for therapeutic drug level monitoring: Secondary | ICD-10-CM | POA: Diagnosis not present

## 2017-06-10 LAB — POCT INR: INR: 1.7

## 2017-06-10 NOTE — Patient Instructions (Signed)
Description   Pt states that she only took 1/2 tablet today Dec 19th  instead of 1 tablet so instructed to take another 1/2 tablet of coumadin today Dec19th then change dose to  1 tablet daily except 1/2 tablet only on  Tuesday .  Recheck INR in 2 weeks. Call with any problems  [phone].

## 2017-06-24 ENCOUNTER — Ambulatory Visit (INDEPENDENT_AMBULATORY_CARE_PROVIDER_SITE_OTHER): Payer: Medicare Other | Admitting: *Deleted

## 2017-06-24 DIAGNOSIS — I481 Persistent atrial fibrillation: Secondary | ICD-10-CM | POA: Diagnosis not present

## 2017-06-24 DIAGNOSIS — Z5181 Encounter for therapeutic drug level monitoring: Secondary | ICD-10-CM | POA: Diagnosis not present

## 2017-06-24 DIAGNOSIS — Z7901 Long term (current) use of anticoagulants: Secondary | ICD-10-CM | POA: Diagnosis not present

## 2017-06-24 DIAGNOSIS — I4819 Other persistent atrial fibrillation: Secondary | ICD-10-CM

## 2017-06-24 LAB — POCT INR: INR: 2.2

## 2017-06-24 NOTE — Patient Instructions (Signed)
Description   Continue taking 1 tablet daily except 1/2 tablet only on Tuesdays.  Recheck INR in 2 weeks. Call with any problems  [phone].

## 2017-07-07 ENCOUNTER — Other Ambulatory Visit: Payer: Self-pay | Admitting: Cardiology

## 2017-07-08 NOTE — Progress Notes (Signed)
Cardiology Office Note:    Date:  07/09/2017   ID:  Ana [name], DOB [birth year], MRN [id number]  PCP:  London Pepper, MD  Cardiologist:  No primary care provider on file.    Referring MD: London Pepper, MD   Chief Complaint  Patient presents with  . Coronary Artery Disease  . Hypertension  . Hyperlipidemia  . Atrial Flutter    History of Present Illness:    Ana [name] is a 84 y.o. female with a hx of permanent atrial flutter, CAD (multivessel CAD s/p PCI with BMS to the LAD and CFX and subsequent staged PCI with BMS to the RCA), chronicdiastolic CHF, pulmonary HTN, minimalmitral stenosis (echo 09/2012), HTN, HL and anemia of chronic disease .  She was dx with ovarian cyst last fall.  At time of surgery for resection is was found that the right ovary was normal and she had an appendiceal mass which was resected.    She is here today for followup and is doing well.  She denies any chest pain or pressure, SOB, DOE, PND, orthopnea, dizziness, palpitations or syncope. She has chronic LE edema which is stable and only in her ankles.  She is compliant with her meds and is tolerating meds with no SE.    Past Medical History:  Diagnosis Date  . Anemia of chronic disease   . Atrial flutter ([place])   . Chronic diastolic CHF (congestive heart failure) ([place]) 2014  . Diabetes mellitus without complication ([place])   . Dizziness 02/13/2015  . Hyperlipidemia   . Hypertension   . Hypothyroidism   . MI, old 07/18/2013  . Mitral stenosis   . Pulmonary HTN ([place])    no evidence on last echo 2015  . Urinary incontinence   . Vitamin D deficiency disease     Past Surgical History:  Procedure Laterality Date  . CATARACT EXTRACTION    . LAPAROSCOPIC APPENDECTOMY N/A 04/14/2017   Procedure: APPENDECTOMY LAPAROSCOPIC;  Surgeon: Fanny Skates, MD;  Location: [facility];  Service: General;  Laterality: N/A;  . LEFT HEART CATHETERIZATION WITH CORONARY ANGIOGRAM N/A 01/17/2014   Procedure: LEFT HEART  CATHETERIZATION WITH CORONARY ANGIOGRAM;  Surgeon: Troy Sine, MD;  Location: [facility] CATH LAB;  Service: Cardiovascular;  Laterality: N/A;  . PERCUTANEOUS CORONARY STENT INTERVENTION (PCI-S)  01/17/2014   Procedure: PERCUTANEOUS CORONARY STENT INTERVENTION (PCI-S);  Surgeon: Troy Sine, MD;  Location: [facility] CATH LAB;  Service: Cardiovascular;;  . PERCUTANEOUS CORONARY STENT INTERVENTION (PCI-S) N/A 01/19/2014   Procedure: PERCUTANEOUS CORONARY STENT INTERVENTION (PCI-S);  Surgeon: Leonie Man, MD;  Location: [facility] CATH LAB;  Service: Cardiovascular;  Laterality: N/A;    Current Medications: Current Meds  Medication Sig  . acetaminophen (TYLENOL) 500 MG tablet Take 500 mg by mouth every 8 (eight) hours as needed for moderate pain or headache.   Marland Kitchen acetaminophen (TYLENOL) 500 MG tablet Take 1 tablet (500 mg total) by mouth every 6 (six) hours as needed.  Marland Kitchen amLODipine (NORVASC) 5 MG tablet TAKE ONE TABLET BY MOUTH ONCE DAILY  . aspirin EC 81 MG tablet Take 81 mg by mouth daily.  . Cholecalciferol (VITAMIN D3) 2000 UNITS TABS Take 2,000 Units by mouth daily.   . Coenzyme Q10 200 MG capsule Take 200 mg by mouth daily.  . Ferrous Sulfate (SLOW FE PO) Take 1 tablet by mouth daily.  Marland Kitchen ibuprofen (ADVIL,MOTRIN) 600 MG tablet Take 1 tablet (600 mg total) by mouth every 6 (six) hours as needed.  Marland Kitchen  levothyroxine (SYNTHROID, LEVOTHROID) 75 MCG tablet Take 75 mcg by mouth daily before breakfast.   . losartan (COZAAR) 100 MG tablet TAKE 1 TABLET BY MOUTH ONCE DAILY  . metFORMIN (GLUCOPHAGE) 1000 MG tablet Take 1 tablet (1,000 mg total) by mouth 2 (two) times daily with a meal.  . Omega-3 Krill Oil 500 MG CAPS Take 500 mg by mouth daily.   . phenylephrine-shark liver oil-mineral oil-petrolatum (PREPARATION H) 0.25-3-14-71.9 % rectal ointment Place 1 application rectally 2 (two) times daily as needed for hemorrhoids.  . polyethylene glycol (MIRALAX / GLYCOLAX) packet Take 17 g by mouth daily as needed for  moderate constipation.   . vitamin B-12 (CYANOCOBALAMIN) 1000 MCG tablet Take 1,000 mcg by mouth daily.  Marland Kitchen warfarin (COUMADIN) 2.5 MG tablet TAKE BY MOUTH AS DIRECTED BY COUMADIN CLINIC (Patient taking differently: Take 2.5 mg by mouth daily on Monday, Wednesday, Friday and Saturday. Take 1.25 mg by mouth daily on all other days.)     Allergies:   Welchol [colesevelam hcl]; Zetia [ezetimibe]; Statins; Crestor [rosuvastatin]; and Zocor [simvastatin]   Social History   Socioeconomic History  . Marital status: Widowed    Spouse name: None  . Number of children: None  . Years of education: None  . Highest education level: None  Social Needs  . Financial resource strain: None  . Food insecurity - worry: None  . Food insecurity - inability: None  . Transportation needs - medical: None  . Transportation needs - non-medical: None  Occupational History  . None  Tobacco Use  . Smoking status: Never Smoker  . Smokeless tobacco: Never Used  Substance and Sexual Activity  . Alcohol use: No  . Drug use: No  . Sexual activity: None  Other Topics Concern  . None  Social History Narrative  . None     Family History: The patient's family history includes Cancer in her mother; Cancer (age of onset: 26) in her other and sister; Cirrhosis in her father; Diabetes in her brother; Heart attack in her sister; Heart disease in her sister.  ROS:   Please see the history of present illness.    ROS  All other systems reviewed and negative.   EKGs/Labs/Other Studies Reviewed:    The following studies were reviewed today: none  EKG:  EKG is not ordered today.  Recent Labs: 04/09/2017: ALT 13; BUN 25; Creatinine, Ser 0.80; Hemoglobin 10.4; Platelets 233; Potassium 4.8; Sodium 140   Recent Lipid Panel    Component Value Date/Time   CHOL 135 12/19/2016 0911   TRIG 90 12/19/2016 0911   HDL 45 12/19/2016 0911   CHOLHDL 3.0 12/19/2016 0911   CHOLHDL 3.2 06/02/2016 1101   VLDL 14 06/02/2016  1101   LDLCALC 72 12/19/2016 0911    Physical Exam:    VS:  BP (!) 156/69   Pulse 75   Ht 5\' 2"  (1.575 m)   Wt 125 lb (56.7 kg)   BMI 22.86 kg/m     Wt Readings from Last 3 Encounters:  07/09/17 125 lb (56.7 kg)  04/14/17 122 lb (55.3 kg)  04/09/17 122 lb (55.3 kg)     GEN:  Well nourished, well developed in no acute distress HEENT: Normal NECK: No JVD; No carotid bruits LYMPHATICS: No lymphadenopathy CARDIAC: irregularly irregular, no murmurs, rubs, gallops RESPIRATORY:  Clear to auscultation without rales, wheezing or rhonchi  ABDOMEN: Soft, non-tender, non-distended MUSCULOSKELETAL:  Trace LE edema; No deformity  SKIN: Warm and dry NEUROLOGIC:  Alert and  oriented x 3 PSYCHIATRIC:  Normal affect   ASSESSMENT:    1. Chronic diastolic CHF (congestive heart failure) ([place])   2. Essential hypertension   3. Persistent atrial fibrillation ([place])   4. Atherosclerosis of native coronary artery of native heart without angina pectoris   5. Pure hypercholesterolemia    PLAN:    In order of problems listed above:  1.  Chronic diastolic CHF - she appears euvolemic on exam but weight is up slightly.  She has not required diuretic therapy but occasionally does have some LE edema.  She would prefer not to be on a diuretic as she already is going to the bathroom a lot.   2.  HTN - BP is borderline  controlled on exam today.  She will continue on Losartan 100mg  daily and amlodipine 5mg  daily.  Her son was concerned about the Losartan recall so I asked him to check with his pharmacy and if hers was recalled then will change to another ARB.  3.  Persistent atrial fibrillation - She remains in NSR on exam.  She will continue on Warfarin.  She is followed in our coumadin clinic with therapeutic INR at 2.2 earlier this month.    4.  ASCAD - multivessel CAD s/p PCI with BMS to the LAD and CFX and subsequent staged PCI with BMS to the RCA.  She denies any anginal symptoms.  She will continue  on ASA.  She is intolerant to statins.   5.  Hyperlipidemia with LDL goal < 70.  She is intolerant to statins.  I will repeat an FLP and if LDL is still high the I will refer her to lipid clinic    Medication Adjustments/Labs and Tests Ordered: Current medicines are reviewed at length with the patient today.  Concerns regarding medicines are outlined above.  No orders of the defined types were placed in this encounter.  No orders of the defined types were placed in this encounter.   Signed, Fransico Him, MD  07/09/2017 10:48 AM    [place]

## 2017-07-09 ENCOUNTER — Encounter: Payer: Self-pay | Admitting: Cardiology

## 2017-07-09 ENCOUNTER — Ambulatory Visit (INDEPENDENT_AMBULATORY_CARE_PROVIDER_SITE_OTHER): Payer: Medicare Other | Admitting: Cardiology

## 2017-07-09 ENCOUNTER — Ambulatory Visit (INDEPENDENT_AMBULATORY_CARE_PROVIDER_SITE_OTHER): Payer: Medicare Other | Admitting: *Deleted

## 2017-07-09 VITALS — BP 156/69 | HR 75 | Ht 62.0 in | Wt 125.0 lb

## 2017-07-09 DIAGNOSIS — I10 Essential (primary) hypertension: Secondary | ICD-10-CM | POA: Diagnosis not present

## 2017-07-09 DIAGNOSIS — I5032 Chronic diastolic (congestive) heart failure: Secondary | ICD-10-CM

## 2017-07-09 DIAGNOSIS — I4819 Other persistent atrial fibrillation: Secondary | ICD-10-CM

## 2017-07-09 DIAGNOSIS — I481 Persistent atrial fibrillation: Secondary | ICD-10-CM | POA: Diagnosis not present

## 2017-07-09 DIAGNOSIS — Z5181 Encounter for therapeutic drug level monitoring: Secondary | ICD-10-CM | POA: Diagnosis not present

## 2017-07-09 DIAGNOSIS — E7800 Pure hypercholesterolemia, unspecified: Secondary | ICD-10-CM | POA: Diagnosis not present

## 2017-07-09 DIAGNOSIS — Z7901 Long term (current) use of anticoagulants: Secondary | ICD-10-CM

## 2017-07-09 DIAGNOSIS — I2510 Atherosclerotic heart disease of native coronary artery without angina pectoris: Secondary | ICD-10-CM

## 2017-07-09 LAB — BASIC METABOLIC PANEL
BUN/Creatinine Ratio: 21 (ref 12–28)
BUN: 19 mg/dL (ref 8–27)
CALCIUM: 9.2 mg/dL (ref 8.7–10.3)
CO2: 24 mmol/L (ref 20–29)
CREATININE: 0.92 mg/dL (ref 0.57–1.00)
Chloride: 104 mmol/L (ref 96–106)
GFR, EST AFRICAN AMERICAN: 67 mL/min/{1.73_m2} (ref >59)
GFR, EST NON AFRICAN AMERICAN: 58 mL/min/{1.73_m2} — AB (ref >59)
Glucose: 120 mg/dL — ABNORMAL HIGH (ref 65–99)
POTASSIUM: 4.2 mmol/L (ref 3.5–5.2)
Sodium: 143 mmol/L (ref 134–144)

## 2017-07-09 LAB — LIPID PANEL
Chol/HDL Ratio: 2.7 ratio (ref 0.0–4.4)
Cholesterol, Total: 154 mg/dL (ref 100–199)
HDL: 57 mg/dL (ref >39)
LDL Calculated: 89 mg/dL (ref 0–99)
Triglycerides: 40 mg/dL (ref 0–149)
VLDL CHOLESTEROL CAL: 8 mg/dL (ref 5–40)

## 2017-07-09 LAB — HEPATIC FUNCTION PANEL
ALT: 13 IU/L (ref 0–32)
AST: 17 IU/L (ref 0–40)
Albumin: 4.1 g/dL (ref 3.5–4.7)
Alkaline Phosphatase: 64 IU/L (ref 39–117)
BILIRUBIN, DIRECT: 0.15 mg/dL (ref 0.00–0.40)
Bilirubin Total: 0.4 mg/dL (ref 0.0–1.2)
Total Protein: 7.2 g/dL (ref 6.0–8.5)

## 2017-07-09 LAB — POCT INR: INR: 2.6

## 2017-07-09 NOTE — Patient Instructions (Signed)
Description   Continue taking 1 tablet daily except 1/2 tablet only on Tuesdays.  Recheck INR in 3 weeks. Call with any problems  [phone].

## 2017-07-09 NOTE — Patient Instructions (Signed)
Medication Instructions:  Your physician recommends that you continue on your current medications as directed. Please refer to the Current Medication list given to you today.  Labwork: Today for kidney function, liver function, and fasting lipids  Testing/Procedures: None ordered   Follow-Up: Your physician wants you to follow-up in: 6 months with Dr. Radford Pax. You will receive a reminder letter in the mail two months in advance. If you don't receive a letter, please call our office to schedule the follow-up appointment.  Any Other Special Instructions Will Be Listed Below (If Applicable).     If you need a refill on your cardiac medications before your next appointment, please call your pharmacy.

## 2017-07-10 ENCOUNTER — Telehealth: Payer: Self-pay

## 2017-07-10 DIAGNOSIS — E7841 Elevated Lipoprotein(a): Secondary | ICD-10-CM

## 2017-07-10 MED ORDER — PRAVASTATIN SODIUM 20 MG PO TABS: 20.0000 mg | ORAL_TABLET | Freq: Every evening | ORAL | 0 refills | Status: DC

## 2017-07-10 NOTE — Telephone Encounter (Signed)
-----   Message from Leeroy Bock, [facility] sent at 07/10/2017  9:10 AM EST ----- Pt is intolerant to Crestor, Zetia, and Welchol. Would recommend starting pravastatin 20mg  every evening if pt is willing to target LDL < 70 given hx of ASCVD.

## 2017-07-10 NOTE — Telephone Encounter (Signed)
This encounter was created in error - please disregard.

## 2017-07-10 NOTE — Addendum Note (Signed)
Addended by: Teressa Senter on: 07/10/2017 11:59 AM   Modules accepted: Orders

## 2017-07-10 NOTE — Telephone Encounter (Signed)
Notes recorded by Teressa Senter, RN on 07/10/2017 at 11:51 AM EST Patient made aware of results and Megan's recommendation to try pravastatin 20 mg once a day. Patient states she had bad body aches with statins in the past, but she will try it out to see if she can tolerate it. Patient scheduled for fasting lipids and ALT on 09/02/17 if able to tolerate. Patient in agreement with plan and thanked me for the call

## 2017-07-13 DIAGNOSIS — R399 Unspecified symptoms and signs involving the genitourinary system: Secondary | ICD-10-CM | POA: Diagnosis not present

## 2017-07-16 DIAGNOSIS — E119 Type 2 diabetes mellitus without complications: Secondary | ICD-10-CM | POA: Diagnosis not present

## 2017-07-16 DIAGNOSIS — Z7901 Long term (current) use of anticoagulants: Secondary | ICD-10-CM | POA: Diagnosis not present

## 2017-07-16 DIAGNOSIS — D373 Neoplasm of uncertain behavior of appendix: Secondary | ICD-10-CM | POA: Diagnosis not present

## 2017-07-16 DIAGNOSIS — I5032 Chronic diastolic (congestive) heart failure: Secondary | ICD-10-CM | POA: Diagnosis not present

## 2017-07-16 DIAGNOSIS — I482 Chronic atrial fibrillation: Secondary | ICD-10-CM | POA: Diagnosis not present

## 2017-07-21 ENCOUNTER — Telehealth: Payer: Self-pay | Admitting: Oncology

## 2017-07-21 ENCOUNTER — Other Ambulatory Visit: Payer: Self-pay | Admitting: General Surgery

## 2017-07-21 DIAGNOSIS — D373 Neoplasm of uncertain behavior of appendix: Secondary | ICD-10-CM

## 2017-07-21 NOTE — Telephone Encounter (Signed)
Appt has been scheduled for the pt to see Dr. Benay Spice on 2/12 at 2pm. Pt aware to arrive 30 minutes early. Pt will cb to confirm if she can come to appt.

## 2017-07-29 ENCOUNTER — Ambulatory Visit (INDEPENDENT_AMBULATORY_CARE_PROVIDER_SITE_OTHER): Payer: Medicare Other | Admitting: *Deleted

## 2017-07-29 DIAGNOSIS — I4819 Other persistent atrial fibrillation: Secondary | ICD-10-CM

## 2017-07-29 DIAGNOSIS — Z7901 Long term (current) use of anticoagulants: Secondary | ICD-10-CM | POA: Diagnosis not present

## 2017-07-29 DIAGNOSIS — Z5181 Encounter for therapeutic drug level monitoring: Secondary | ICD-10-CM | POA: Diagnosis not present

## 2017-07-29 DIAGNOSIS — I481 Persistent atrial fibrillation: Secondary | ICD-10-CM

## 2017-07-29 LAB — POCT INR: INR: 2.6

## 2017-07-29 NOTE — Patient Instructions (Signed)
Description   Continue taking 1 tablet daily except 1/2 tablet only on Tuesdays.  Recheck INR in 4 weeks. Call with any problems  [phone].

## 2017-08-04 ENCOUNTER — Ambulatory Visit: Payer: Medicare Other | Admitting: Oncology

## 2017-08-17 ENCOUNTER — Other Ambulatory Visit: Payer: Medicare Other

## 2017-08-25 ENCOUNTER — Ambulatory Visit: Payer: Medicare Other | Admitting: Oncology

## 2017-08-25 NOTE — Progress Notes (Signed)
  Oncology Nurse Navigator Documentation  Navigator Location: [facility] (08/25/17 0942)   )Navigator Encounter Type: Introductory phone call;Telephone (08/25/17 6812) Telephone: Jerilee Hoh Confirmation/Clarification (08/25/17 7517)  Called patient to confirm today's new patient appointment. Patient stated that she had cancelled the appointment because she had not had the CT scan ordered by Dr. Dalbert Batman. Patient had cancelled that appointment as well. I gave patient my direct phone number and Wilhelmina Mcardle number to reschedule. I called [facility] to let them know that the CT scan had not been done and that I would follow-up with patient after the CT scan was complete.                     Barriers/Navigation Needs: Coordination of Care (08/25/17 0017)                Acuity: Level 2 (08/25/17 4944)         Time Spent with Patient: 30 (08/25/17 9675)

## 2017-08-26 ENCOUNTER — Ambulatory Visit (INDEPENDENT_AMBULATORY_CARE_PROVIDER_SITE_OTHER): Payer: Medicare Other | Admitting: *Deleted

## 2017-08-26 DIAGNOSIS — I4819 Other persistent atrial fibrillation: Secondary | ICD-10-CM

## 2017-08-26 DIAGNOSIS — Z7901 Long term (current) use of anticoagulants: Secondary | ICD-10-CM

## 2017-08-26 DIAGNOSIS — I481 Persistent atrial fibrillation: Secondary | ICD-10-CM

## 2017-08-26 DIAGNOSIS — Z5181 Encounter for therapeutic drug level monitoring: Secondary | ICD-10-CM

## 2017-08-26 LAB — POCT INR: INR: 2.5

## 2017-08-26 NOTE — Patient Instructions (Addendum)
Description   Continue taking 1 tablet daily except 1/2 tablet only on Tuesdays.  Recheck INR in 6 weeks. Call with any problems  #[phone].      

## 2017-08-27 ENCOUNTER — Other Ambulatory Visit: Payer: Self-pay | Admitting: Cardiology

## 2017-09-02 ENCOUNTER — Other Ambulatory Visit: Payer: Medicare Other

## 2017-09-14 NOTE — Progress Notes (Signed)
  Oncology Nurse Navigator Documentation  Navigator Location: [facility] (09/14/17 1200)   )Navigator Encounter Type: Introductory phone call (09/14/17 1200) Telephone: Lahoma Crocker Call;Appt Confirmation/Clarification (09/14/17 1200)  Called patient regarding the CT scan that Dr. Dalbert Batman had ordered. Patient has not has CT scan done. Patient knows to call [facility] for assistance with getting this scheduled.  Once the CT scan is complete patient will be willing to come in for consult with Dr. Benay Spice. She stated "there is no sense in coming to see him until I have the CT done." I will F/U.                     Barriers/Navigation Needs: Coordination of Care (09/14/17 1200)                Acuity: Level 1 (09/14/17 1200)         Time Spent with Patient: 15 (09/14/17 1200)

## 2017-09-17 ENCOUNTER — Other Ambulatory Visit: Payer: Self-pay | Admitting: Cardiology

## 2017-09-29 ENCOUNTER — Ambulatory Visit
Admission: RE | Admit: 2017-09-29 | Discharge: 2017-09-29 | Disposition: A | Discharge status: Home / Self Care | Payer: Medicare Other | Source: Ambulatory Visit | Attending: General Surgery | Admitting: General Surgery

## 2017-09-29 DIAGNOSIS — C181 Malignant neoplasm of appendix: Secondary | ICD-10-CM | POA: Diagnosis not present

## 2017-09-29 DIAGNOSIS — D373 Neoplasm of uncertain behavior of appendix: Secondary | ICD-10-CM

## 2017-09-29 MED ORDER — IOPAMIDOL (ISOVUE-300) INJECTION 61%
100.0000 mL | Freq: Once | INTRAVENOUS | Status: AC | PRN
  Administered 2017-09-29: 100 mL via INTRAVENOUS

## 2017-10-05 IMAGING — US US PELVIS COMPLETE
1 series · 13 of 25 positions shown · non-contrast
Comparison: Ultrasound January 21, 2017. CT scan January 14, 2014.

CLINICAL DATA: Complex mass seen on renal ultrasound. Pelvic pain
for 6 months.



[Series 1: us pelvis complete · 0.19mm/px · 13 of 51 slices shown]
[im 1/51]
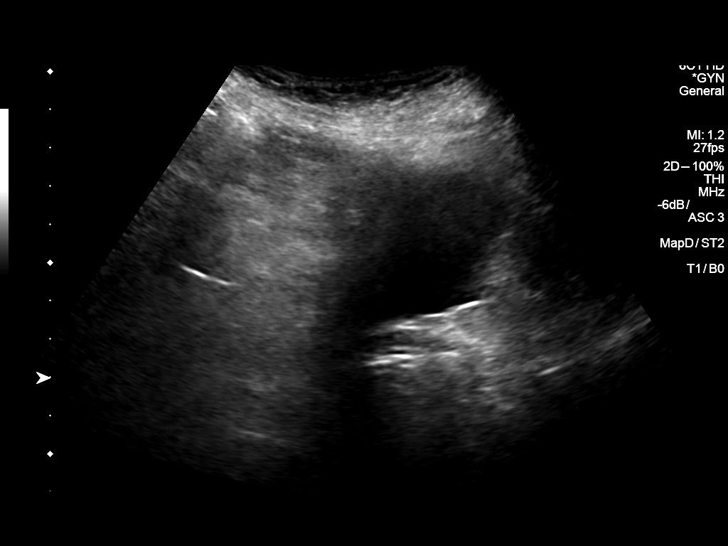
[im 5/51]
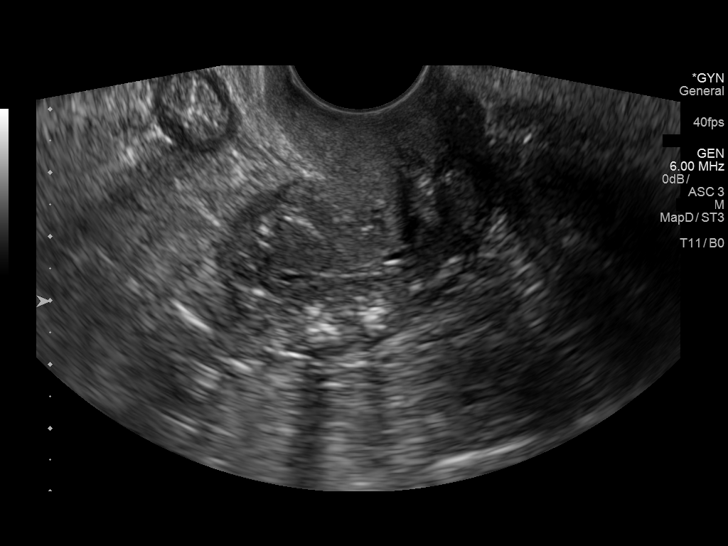
[im 9/51]
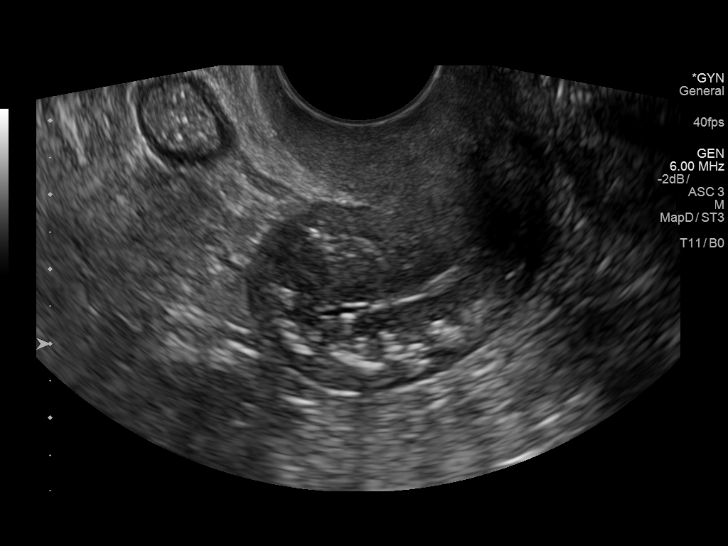
[im 13/51]
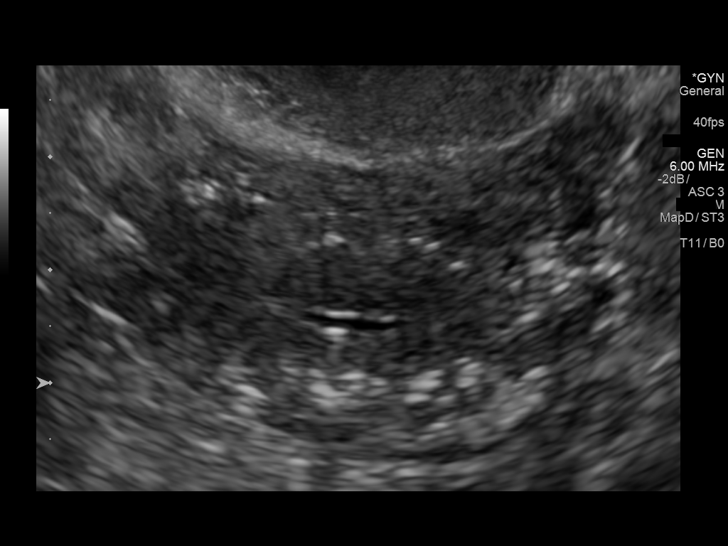
[im 17/51]
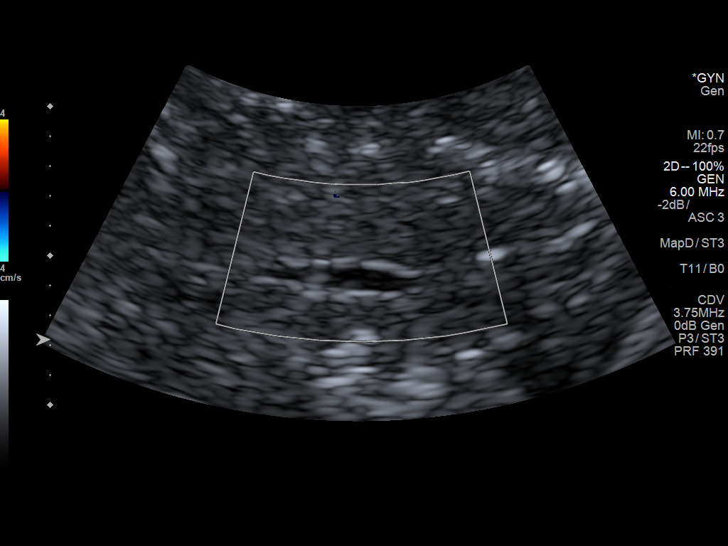
[im 21/51]
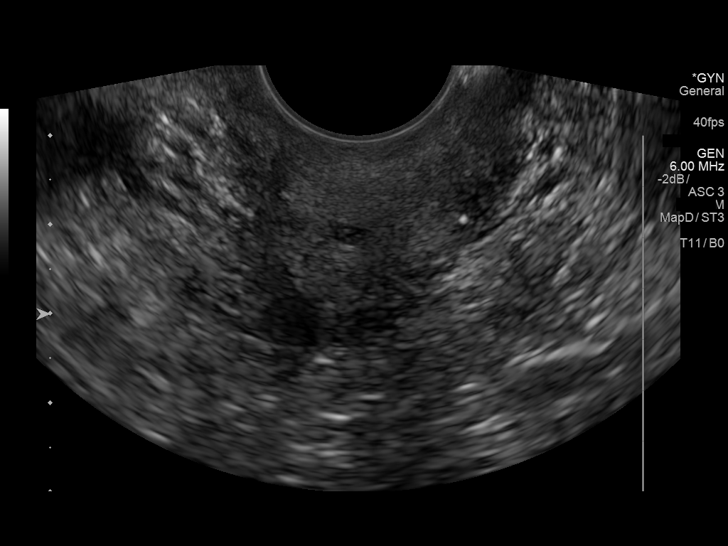
[im 26/51]
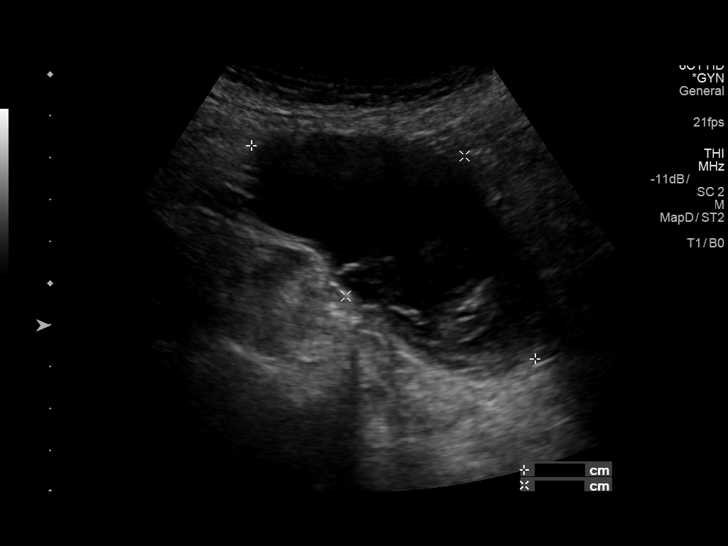
[im 30/51]
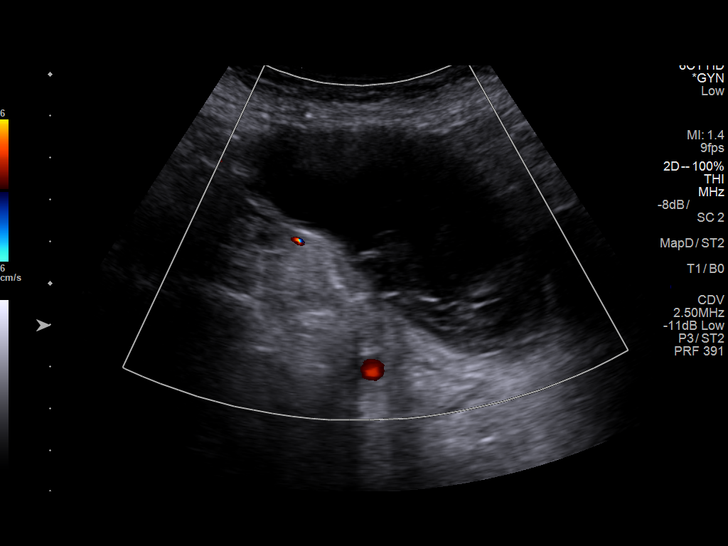
[im 34/51]
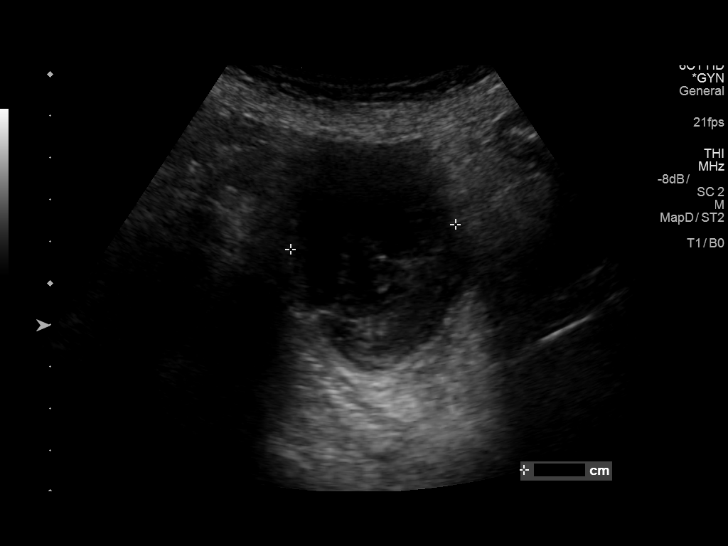
[im 38/51]
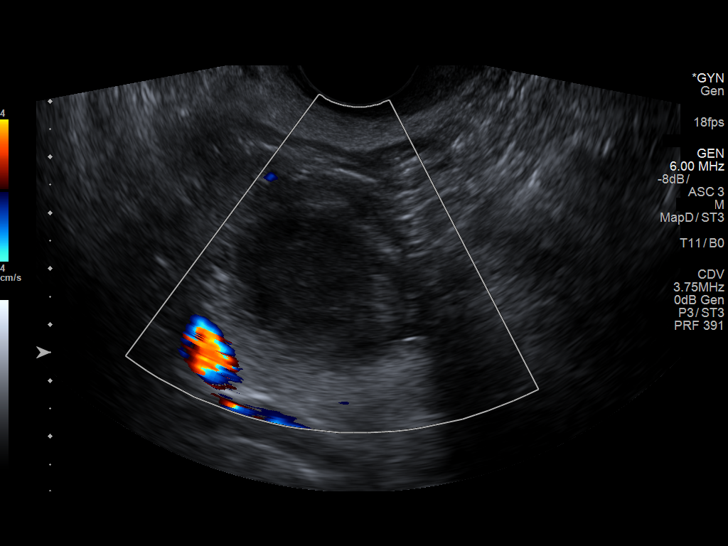
[im 42/51]
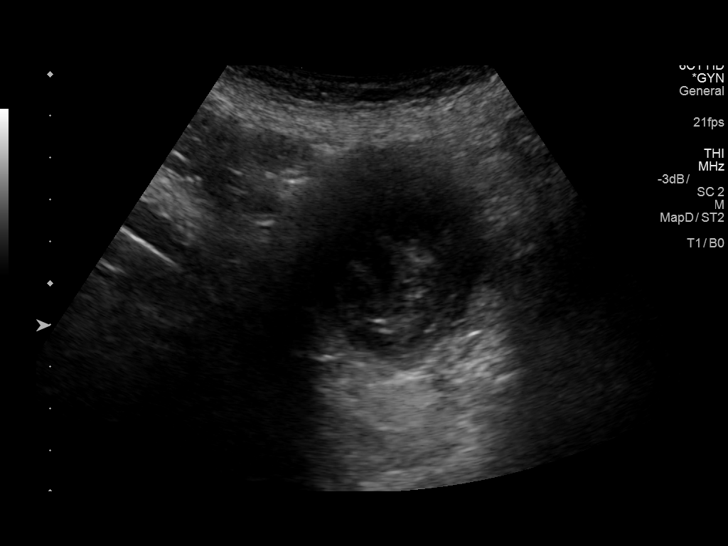
[im 46/51]
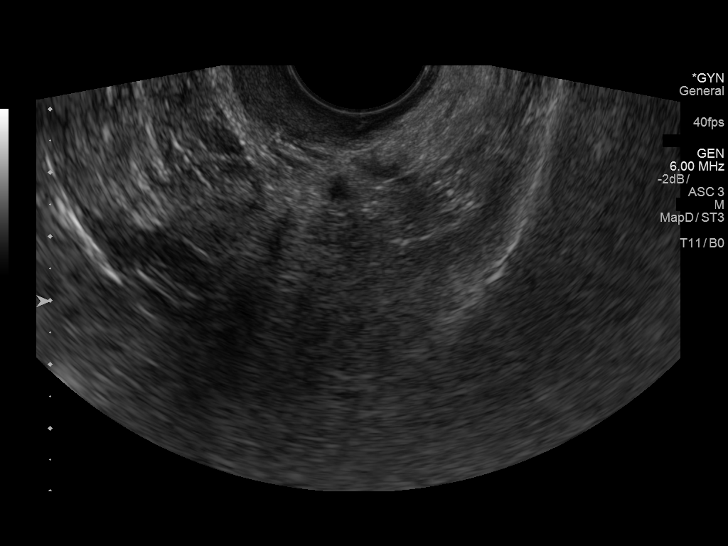
[im 51/51]
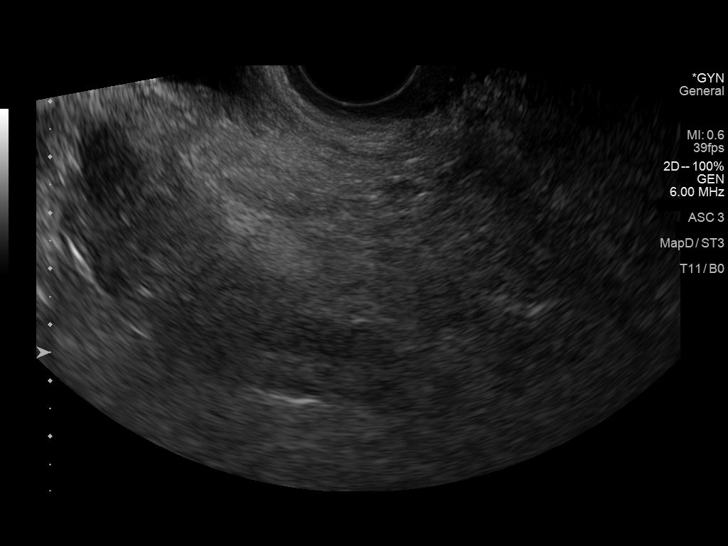

[13 of 25 positions shown; findings below may reference images not displayed]

FINDINGS: Uterus

Measurements: 6.7 x 4.2 x 2.8 cm. Multiple echogenic foci are noted
consistent with calcifications. No large fibroids are noted.

Endometrium

Thickness: 3 mm which is within normal limits for postmenopausal
patient. 1.6 mm focal nodule is seen within the endometrium most
consistent with benign polyp in a symptomatic patient.

Right ovary

Not visualized.

Left ovary

Not visualized.

Other findings

Complex right adnexal mass is noted with significant solid
component. The mass measures 8.5 x 4.4 x 4.0 cm overall.
IMPRESSION: 8.5 x 4.4 x 4.0 cm complex right adnexal mass is noted with
significant solid component. This is highly concerning for neoplasm
and MRI is recommended for further evaluation as well as
consultation with gynecological surgery.

These results will be called to the ordering clinician or
representative by the Radiologist Assistant, and communication
documented in the PACS or zVision Dashboard.

## 2017-10-07 ENCOUNTER — Ambulatory Visit (INDEPENDENT_AMBULATORY_CARE_PROVIDER_SITE_OTHER): Payer: Medicare Other | Admitting: *Deleted

## 2017-10-07 DIAGNOSIS — Z5181 Encounter for therapeutic drug level monitoring: Secondary | ICD-10-CM | POA: Diagnosis not present

## 2017-10-07 DIAGNOSIS — I4819 Other persistent atrial fibrillation: Secondary | ICD-10-CM

## 2017-10-07 DIAGNOSIS — Z7901 Long term (current) use of anticoagulants: Secondary | ICD-10-CM | POA: Diagnosis not present

## 2017-10-07 DIAGNOSIS — I481 Persistent atrial fibrillation: Secondary | ICD-10-CM | POA: Diagnosis not present

## 2017-10-07 LAB — POCT INR: INR: 2.4

## 2017-10-07 NOTE — Patient Instructions (Signed)
Description   Continue taking 1 tablet daily except 1/2 tablet only on Tuesdays.  Recheck INR in 6 weeks. Call with any problems  #[phone].      

## 2017-12-22 ENCOUNTER — Other Ambulatory Visit: Payer: Self-pay | Admitting: Cardiology

## 2017-12-22 NOTE — Telephone Encounter (Signed)
Dolores Frame called requesting a refill for the pt., stating that pt is down in [place] visiting and that the pt needs a refill on Warfarin sent to [facility] in [place], PH# [phone]. Santiago Glad can be reached at [phone]. Thank you

## 2017-12-22 NOTE — Telephone Encounter (Signed)
Telephoned and spoke with Santiago Glad, pt's sister. Informed her that pt is past due for an INR since she missed her 11/18/17 appt. Santiago Glad understood and pt thinks she has at least 2 weeks quantity of meds left. Rx sent to [facility] on [address] in [place], [place]. [facility] lab customer service [phone]. Santiago Glad states she will take pt in the AM for INR lab. Will send Rx refill after lab is received as pt has 2 wk supply

## 2017-12-25 DIAGNOSIS — Z5181 Encounter for therapeutic drug level monitoring: Secondary | ICD-10-CM | POA: Diagnosis not present

## 2017-12-25 DIAGNOSIS — I481 Persistent atrial fibrillation: Secondary | ICD-10-CM | POA: Diagnosis not present

## 2017-12-25 LAB — PROTIME-INR: INR: 2.9 — AB (ref 0.9–1.1)

## 2017-12-28 ENCOUNTER — Telehealth: Payer: Self-pay | Admitting: *Deleted

## 2017-12-28 ENCOUNTER — Ambulatory Visit (INDEPENDENT_AMBULATORY_CARE_PROVIDER_SITE_OTHER): Payer: Medicare Other | Admitting: Cardiovascular Disease

## 2017-12-28 DIAGNOSIS — I4819 Other persistent atrial fibrillation: Secondary | ICD-10-CM

## 2017-12-28 DIAGNOSIS — I481 Persistent atrial fibrillation: Secondary | ICD-10-CM

## 2017-12-28 DIAGNOSIS — Z5181 Encounter for therapeutic drug level monitoring: Secondary | ICD-10-CM | POA: Diagnosis not present

## 2017-12-28 DIAGNOSIS — Z7901 Long term (current) use of anticoagulants: Secondary | ICD-10-CM

## 2017-12-28 NOTE — Patient Instructions (Signed)
Description   Continue taking 1 tablet daily except 1/2 tablet only on Tuesdays.  Recheck INR in 6 weeks. Call with any problems  [phone].

## 2017-12-28 NOTE — Telephone Encounter (Signed)
Pt called & stated he needs her refill sent to [place]. Pt had INR dosed this morning and a return appt set in the office in August. Her sister Dolores Frame was able to give me the number to the closest [place] to them ([phone]); called [facility] in [facility] and spoke with [place] and gave a verbal order to [facility] for Warfarin 2.5mg  tablet with 35 tabs and 1 refill and instructions to take 1/2 tab to 1 tab or as directed by Anticoagulation Clinic. Called pt to inform pt that the refill was taken care of & to ensure she knew to continue taking 1 tab daily except 1/2 tab on Tuesdays & she verbalized understanding.

## 2018-02-10 ENCOUNTER — Ambulatory Visit (INDEPENDENT_AMBULATORY_CARE_PROVIDER_SITE_OTHER): Payer: Medicare Other

## 2018-02-10 DIAGNOSIS — I4819 Other persistent atrial fibrillation: Secondary | ICD-10-CM

## 2018-02-10 DIAGNOSIS — I481 Persistent atrial fibrillation: Secondary | ICD-10-CM

## 2018-02-10 DIAGNOSIS — Z7901 Long term (current) use of anticoagulants: Secondary | ICD-10-CM | POA: Diagnosis not present

## 2018-02-10 DIAGNOSIS — Z5181 Encounter for therapeutic drug level monitoring: Secondary | ICD-10-CM

## 2018-02-10 LAB — POCT INR: INR: 3.1 — AB (ref 2.0–3.0)

## 2018-02-10 NOTE — Patient Instructions (Signed)
Description   Take 1/2 tablet tomorrow, then resume same dosage 1 tablet daily except 1/2 tablet only on Tuesdays.  Recheck INR in 6 weeks. Call with any problems  [phone].

## 2018-03-09 DIAGNOSIS — E538 Deficiency of other specified B group vitamins: Secondary | ICD-10-CM | POA: Diagnosis not present

## 2018-03-09 DIAGNOSIS — D649 Anemia, unspecified: Secondary | ICD-10-CM | POA: Diagnosis not present

## 2018-03-09 DIAGNOSIS — R32 Unspecified urinary incontinence: Secondary | ICD-10-CM | POA: Diagnosis not present

## 2018-03-09 DIAGNOSIS — Z0000 Encounter for general adult medical examination without abnormal findings: Secondary | ICD-10-CM | POA: Diagnosis not present

## 2018-03-09 DIAGNOSIS — E785 Hyperlipidemia, unspecified: Secondary | ICD-10-CM | POA: Diagnosis not present

## 2018-03-09 DIAGNOSIS — H9190 Unspecified hearing loss, unspecified ear: Secondary | ICD-10-CM | POA: Diagnosis not present

## 2018-03-09 DIAGNOSIS — Z23 Encounter for immunization: Secondary | ICD-10-CM | POA: Diagnosis not present

## 2018-03-09 DIAGNOSIS — R2689 Other abnormalities of gait and mobility: Secondary | ICD-10-CM | POA: Diagnosis not present

## 2018-03-09 DIAGNOSIS — E039 Hypothyroidism, unspecified: Secondary | ICD-10-CM | POA: Diagnosis not present

## 2018-03-09 DIAGNOSIS — E1169 Type 2 diabetes mellitus with other specified complication: Secondary | ICD-10-CM | POA: Diagnosis not present

## 2018-03-09 DIAGNOSIS — I10 Essential (primary) hypertension: Secondary | ICD-10-CM | POA: Diagnosis not present

## 2018-03-10 DIAGNOSIS — D649 Anemia, unspecified: Secondary | ICD-10-CM | POA: Diagnosis not present

## 2018-03-18 ENCOUNTER — Ambulatory Visit (INDEPENDENT_AMBULATORY_CARE_PROVIDER_SITE_OTHER): Payer: Medicare Other | Admitting: Cardiology

## 2018-03-18 ENCOUNTER — Ambulatory Visit (INDEPENDENT_AMBULATORY_CARE_PROVIDER_SITE_OTHER): Payer: Medicare Other

## 2018-03-18 ENCOUNTER — Encounter: Payer: Self-pay | Admitting: Cardiology

## 2018-03-18 VITALS — BP 128/70 | HR 85 | Ht 62.0 in | Wt 134.8 lb

## 2018-03-18 DIAGNOSIS — I5032 Chronic diastolic (congestive) heart failure: Secondary | ICD-10-CM

## 2018-03-18 DIAGNOSIS — Z5181 Encounter for therapeutic drug level monitoring: Secondary | ICD-10-CM | POA: Diagnosis not present

## 2018-03-18 DIAGNOSIS — I4819 Other persistent atrial fibrillation: Secondary | ICD-10-CM

## 2018-03-18 DIAGNOSIS — I050 Rheumatic mitral stenosis: Secondary | ICD-10-CM | POA: Diagnosis not present

## 2018-03-18 DIAGNOSIS — I2510 Atherosclerotic heart disease of native coronary artery without angina pectoris: Secondary | ICD-10-CM

## 2018-03-18 DIAGNOSIS — I481 Persistent atrial fibrillation: Secondary | ICD-10-CM

## 2018-03-18 DIAGNOSIS — I10 Essential (primary) hypertension: Secondary | ICD-10-CM

## 2018-03-18 DIAGNOSIS — E7800 Pure hypercholesterolemia, unspecified: Secondary | ICD-10-CM

## 2018-03-18 DIAGNOSIS — Z7901 Long term (current) use of anticoagulants: Secondary | ICD-10-CM | POA: Diagnosis not present

## 2018-03-18 LAB — POCT INR: INR: 3.4 — AB (ref 2.0–3.0)

## 2018-03-18 NOTE — Patient Instructions (Signed)
Description   Skip tomorrow's dosage of Coumadin, then start taking 1 tablet daily except 1/2 tablet on Tuesdays and Saturdays.  Recheck INR in 3 weeks. Call with any problems  [phone].

## 2018-03-18 NOTE — Patient Instructions (Signed)
Medication Instructions:  Your physician recommends that you continue on your current medications as directed. Please refer to the Current Medication list given to you today.  Testing/Procedures: Your physician has requested that you have an echocardiogram. Echocardiography is a painless test that uses sound waves to create images of your heart. It provides your doctor with information about the size and shape of your heart and how well your heart's chambers and valves are working. This procedure takes approximately one hour. There are no restrictions for this procedure.  Follow-Up: Your physician wants you to follow-up in: 6 months with Dr. Radford Pax. You will receive a reminder letter in the mail two months in advance. If you don't receive a letter, please call our office to schedule the follow-up appointment.  If you need a refill on your cardiac medications before your next appointment, please call your pharmacy.

## 2018-03-18 NOTE — Progress Notes (Signed)
Cardiology Office Note:    Date:  03/18/2018   ID:  Ana [name], DOB [birth year], MRN [id number]  PCP:  London Pepper, MD  Cardiologist:  No primary care provider on file.    Referring MD: London Pepper, MD   Chief Complaint  Patient presents with  . Coronary Artery Disease  . Congestive Heart Failure  . Hypertension  . Hyperlipidemia    History of Present Illness:    Ana [name] is a 84 y.o. female with a hx of permanent atrial flutter,CAD (multivessel CAD s/p PCI with BMS to the LAD and CFX and subsequent staged PCI with BMS to the RCA),chronicdiastolic CHF, pulmonary HTN, minimalmitral stenosis (echo 09/2012), HTN, HL and anemia of chronic disease.  She is here today for followup and is doing well.  She denies any chest pain or pressure, SOB, DOE, PND, orthopnea, LE edema, dizziness, palpitations or syncope. She is compliant with her meds and is tolerating meds with no SE.    Past Medical History:  Diagnosis Date  . Anemia of chronic disease   . Atrial flutter ([place])   . Chronic diastolic CHF (congestive heart failure) ([place]) 2014  . Diabetes mellitus without complication ([place])   . Dizziness 02/13/2015  . Hyperlipidemia   . Hypertension   . Hypothyroidism   . MI, old 07/18/2013  . Mitral stenosis   . Pulmonary HTN ([place])    no evidence on last echo 2015  . Urinary incontinence   . Vitamin D deficiency disease     Past Surgical History:  Procedure Laterality Date  . CATARACT EXTRACTION    . LAPAROSCOPIC APPENDECTOMY N/A 04/14/2017   Procedure: APPENDECTOMY LAPAROSCOPIC;  Surgeon: Fanny Skates, MD;  Location: [facility];  Service: General;  Laterality: N/A;  . LEFT HEART CATHETERIZATION WITH CORONARY ANGIOGRAM N/A 01/17/2014   Procedure: LEFT HEART CATHETERIZATION WITH CORONARY ANGIOGRAM;  Surgeon: Troy Sine, MD;  Location: [facility] CATH LAB;  Service: Cardiovascular;  Laterality: N/A;  . PERCUTANEOUS CORONARY STENT INTERVENTION (PCI-S)  01/17/2014   Procedure:  PERCUTANEOUS CORONARY STENT INTERVENTION (PCI-S);  Surgeon: Troy Sine, MD;  Location: [facility] CATH LAB;  Service: Cardiovascular;;  . PERCUTANEOUS CORONARY STENT INTERVENTION (PCI-S) N/A 01/19/2014   Procedure: PERCUTANEOUS CORONARY STENT INTERVENTION (PCI-S);  Surgeon: Leonie Man, MD;  Location: [facility] CATH LAB;  Service: Cardiovascular;  Laterality: N/A;    Current Medications: Current Meds  Medication Sig  . acetaminophen (TYLENOL) 500 MG tablet Take 500 mg by mouth every 8 (eight) hours as needed for moderate pain or headache.   Marland Kitchen amLODipine (NORVASC) 5 MG tablet TAKE ONE TABLET BY MOUTH ONCE DAILY  . aspirin EC 81 MG tablet Take 81 mg by mouth daily.  . Cholecalciferol (VITAMIN D3) 2000 UNITS TABS Take 2,000 Units by mouth daily.   . Coenzyme Q10 200 MG capsule Take 200 mg by mouth daily.  . Ferrous Sulfate (SLOW FE PO) Take 1 tablet by mouth daily.  Marland Kitchen levothyroxine (SYNTHROID, LEVOTHROID) 75 MCG tablet Take 75 mcg by mouth daily before breakfast.   . losartan (COZAAR) 100 MG tablet TAKE 1 TABLET BY MOUTH ONCE DAILY  . metFORMIN (GLUCOPHAGE) 1000 MG tablet Take 1 tablet (1,000 mg total) by mouth 2 (two) times daily with a meal. (Patient taking differently: Take 1,000 mg by mouth daily. )  . Omega-3 Krill Oil 500 MG CAPS Take 500 mg by mouth daily.   . phenylephrine-shark liver oil-mineral oil-petrolatum (PREPARATION H) 0.25-3-14-71.9 % rectal ointment Place 1 application rectally  2 (two) times daily as needed for hemorrhoids.  . polyethylene glycol (MIRALAX / GLYCOLAX) packet Take 17 g by mouth daily as needed for moderate constipation.   . pravastatin (PRAVACHOL) 20 MG tablet TAKE 1 TABLET BY MOUTH ONCE DAILY IN THE EVENING  . vitamin B-12 (CYANOCOBALAMIN) 1000 MCG tablet Take 1,000 mcg by mouth daily.  Marland Kitchen warfarin (COUMADIN) 2.5 MG tablet TAKE BY MOUTH AS DIRECTED BY COUMADIN CLINIC     Allergies:   Welchol [colesevelam hcl]; Zetia [ezetimibe]; Statins; Crestor [rosuvastatin]; and  Zocor [simvastatin]   Social History   Socioeconomic History  . Marital status: Widowed    Spouse name: Not on file  . Number of children: Not on file  . Years of education: Not on file  . Highest education level: Not on file  Occupational History  . Not on file  Social Needs  . Financial resource strain: Not on file  . Food insecurity:    Worry: Not on file    Inability: Not on file  . Transportation needs:    Medical: Not on file    Non-medical: Not on file  Tobacco Use  . Smoking status: Never Smoker  . Smokeless tobacco: Never Used  Substance and Sexual Activity  . Alcohol use: No  . Drug use: No  . Sexual activity: Not on file  Lifestyle  . Physical activity:    Days per week: Not on file    Minutes per session: Not on file  . Stress: Not on file  Relationships  . Social connections:    Talks on phone: Not on file    Gets together: Not on file    Attends religious service: Not on file    Active member of club or organization: Not on file    Attends meetings of clubs or organizations: Not on file    Relationship status: Not on file  Other Topics Concern  . Not on file  Social History Narrative  . Not on file     Family History: The patient's family history includes Cancer in her mother; Cancer (age of onset: 46) in her other and sister; Cirrhosis in her father; Diabetes in her brother; Heart attack in her sister; Heart disease in her sister.  ROS:   Please see the history of present illness.    ROS  All other systems reviewed and negative.   EKGs/Labs/Other Studies Reviewed:    The following studies were reviewed today: none  EKG:  EKG is ordered today and showed normal sinus rhythm with occasional PVCs possible inferior infarct  Recent Labs: 04/09/2017: Hemoglobin 10.4; Platelets 233 07/09/2017: ALT 13; BUN 19; Creatinine, Ser 0.92; Potassium 4.2; Sodium 143   Recent Lipid Panel    Component Value Date/Time   CHOL 154 07/09/2017 1108   TRIG 40  07/09/2017 1108   HDL 57 07/09/2017 1108   CHOLHDL 2.7 07/09/2017 1108   CHOLHDL 3.2 06/02/2016 1101   VLDL 14 06/02/2016 1101   LDLCALC 89 07/09/2017 1108    Physical Exam:    VS:  BP 128/70   Pulse 85   Ht 5\' 2"  (1.575 m)   Wt 134 lb 12.8 oz (61.1 kg)   BMI 24.66 kg/m     Wt Readings from Last 3 Encounters:  03/18/18 134 lb 12.8 oz (61.1 kg)  07/09/17 125 lb (56.7 kg)  04/14/17 122 lb (55.3 kg)     GEN:  Well nourished, well developed in no acute distress HEENT: Normal NECK: No JVD;  No carotid bruits LYMPHATICS: No lymphadenopathy CARDIAC: RRR, no murmurs, rubs, gallops RESPIRATORY:  Clear to auscultation without rales, wheezing or rhonchi  ABDOMEN: Soft, non-tender, non-distended MUSCULOSKELETAL:  No edema; No deformity  SKIN: Warm and dry NEUROLOGIC:  Alert and oriented x 3 PSYCHIATRIC:  Normal affect   ASSESSMENT:    1. Chronic diastolic CHF (congestive heart failure) ([place])   2. Atherosclerosis of native coronary artery of native heart without angina pectoris   3. Essential hypertension   4. Persistent atrial fibrillation ([place])   5. Pure hypercholesterolemia    PLAN:    In order of problems listed above:  1.  Chronic diastolic CHF -she appears euvolemic on exam and weight is stable today.  She has not required any diuretic therapy recently.  2.  ASCAD - multivessel CAD s/p PCI with BMS to the LAD and CFX and subsequent staged PCI with BMS to the RCA.  She denies any anginal symptoms.  She will continue on aspirin 81 mg daily and statin.  3.  HTN - BP is well controlled on exam today.  She will continue on amlodipine 5 mg daily and losartan 100 mg daily  4.  Permanent atrial fibrillation - rate is well controlled on exam today.  She is not on any beta-blocker or calcium channel blocker therapy.  She will continue on warfarin followed in our Coumadin clinic.  5.  Hyperlipidemia -LDL goal is less than 70 due to underlying CAD.  Her LDL was 55 on 03/09/2018 with  normal ALT at 11.  She will continue on pravastatin 20 mg daily.  6.  Mitral stenosis - very mild by echo in 2015 with mitral valve area calculated 1.8 cm.  We will repeat an echo to make sure this is not progressed.     Medication Adjustments/Labs and Tests Ordered: Current medicines are reviewed at length with the patient today.  Concerns regarding medicines are outlined above.  Orders Placed This Encounter  Procedures  . EKG 12-Lead   No orders of the defined types were placed in this encounter.   Signed, Fransico Him, MD  03/18/2018 11:43 AM    [place]

## 2018-03-19 ENCOUNTER — Other Ambulatory Visit: Payer: Self-pay | Admitting: Pharmacist

## 2018-03-19 MED ORDER — WARFARIN SODIUM 2.5 MG PO TABS: ORAL_TABLET | ORAL | 1 refills | Status: DC

## 2018-03-23 ENCOUNTER — Encounter: Payer: Self-pay | Admitting: Cardiology

## 2018-03-23 ENCOUNTER — Ambulatory Visit (HOSPITAL_COMMUNITY): Payer: Medicare Other | Attending: Cardiology

## 2018-03-23 ENCOUNTER — Other Ambulatory Visit: Payer: Self-pay

## 2018-03-23 ENCOUNTER — Telehealth: Payer: Self-pay

## 2018-03-23 DIAGNOSIS — I080 Rheumatic disorders of both mitral and aortic valves: Secondary | ICD-10-CM | POA: Diagnosis not present

## 2018-03-23 DIAGNOSIS — I351 Nonrheumatic aortic (valve) insufficiency: Secondary | ICD-10-CM | POA: Insufficient documentation

## 2018-03-23 DIAGNOSIS — I050 Rheumatic mitral stenosis: Secondary | ICD-10-CM

## 2018-03-23 DIAGNOSIS — I272 Pulmonary hypertension, unspecified: Secondary | ICD-10-CM

## 2018-03-23 NOTE — Telephone Encounter (Signed)
Spoke with the patient about her echo results. She expressed understanding and accepted the 1 year follow up echo.

## 2018-03-23 NOTE — Telephone Encounter (Signed)
-----   Message from Sueanne Margarita, MD sent at 03/23/2018 11:50 AM EDT ----- These let patient know that echo showed normal LV function with moderately leaky aortic valve and mild mitral stenosis with mild MR.  Mildly elevated blood pressure in the arteries of the lungs.  Please repeat echo in 1 year to reassess.

## 2018-03-26 ENCOUNTER — Other Ambulatory Visit: Payer: Self-pay | Admitting: Cardiology

## 2018-04-08 ENCOUNTER — Ambulatory Visit (INDEPENDENT_AMBULATORY_CARE_PROVIDER_SITE_OTHER): Payer: Medicare Other | Admitting: *Deleted

## 2018-04-08 DIAGNOSIS — Z7901 Long term (current) use of anticoagulants: Secondary | ICD-10-CM

## 2018-04-08 DIAGNOSIS — Z5181 Encounter for therapeutic drug level monitoring: Secondary | ICD-10-CM | POA: Diagnosis not present

## 2018-04-08 DIAGNOSIS — I4819 Other persistent atrial fibrillation: Secondary | ICD-10-CM | POA: Diagnosis not present

## 2018-04-08 LAB — POCT INR: INR: 2.6 (ref 2.0–3.0)

## 2018-04-08 NOTE — Patient Instructions (Signed)
Description   Continue  taking 1 tablet daily except 1/2 tablet on Tuesdays and Saturdays.  Recheck INR in 4 weeks. Call with any problems  [phone].

## 2018-04-20 DIAGNOSIS — D649 Anemia, unspecified: Secondary | ICD-10-CM | POA: Diagnosis not present

## 2018-05-05 ENCOUNTER — Ambulatory Visit (INDEPENDENT_AMBULATORY_CARE_PROVIDER_SITE_OTHER): Payer: Medicare Other

## 2018-05-05 DIAGNOSIS — Z5181 Encounter for therapeutic drug level monitoring: Secondary | ICD-10-CM | POA: Diagnosis not present

## 2018-05-05 DIAGNOSIS — I4819 Other persistent atrial fibrillation: Secondary | ICD-10-CM | POA: Diagnosis not present

## 2018-05-05 DIAGNOSIS — Z7901 Long term (current) use of anticoagulants: Secondary | ICD-10-CM

## 2018-05-05 LAB — POCT INR: INR: 4.4 — AB (ref 2.0–3.0)

## 2018-05-05 NOTE — Patient Instructions (Signed)
Description   Skip 2 dosages of Coumadin, then start taking 1 tablet daily except 1/2 tablet on Tuesdays, Thursdays and Saturdays.  Recheck INR in 2 weeks. Call with any problems  [phone].

## 2018-05-18 ENCOUNTER — Ambulatory Visit (INDEPENDENT_AMBULATORY_CARE_PROVIDER_SITE_OTHER): Payer: Medicare Other | Admitting: *Deleted

## 2018-05-18 DIAGNOSIS — I4819 Other persistent atrial fibrillation: Secondary | ICD-10-CM

## 2018-05-18 DIAGNOSIS — Z7901 Long term (current) use of anticoagulants: Secondary | ICD-10-CM

## 2018-05-18 DIAGNOSIS — Z5181 Encounter for therapeutic drug level monitoring: Secondary | ICD-10-CM | POA: Diagnosis not present

## 2018-05-18 LAB — POCT INR: INR: 2.3 (ref 2.0–3.0)

## 2018-05-18 NOTE — Patient Instructions (Signed)
Description   Continue  taking 1 tablet daily except 1/2 tablet on Tuesdays, Thursdays and Saturdays.  Recheck INR in 3 weeks. Call with any problems  [phone].

## 2018-06-08 ENCOUNTER — Ambulatory Visit (INDEPENDENT_AMBULATORY_CARE_PROVIDER_SITE_OTHER): Payer: Medicare Other | Admitting: Pharmacist

## 2018-06-08 DIAGNOSIS — Z7901 Long term (current) use of anticoagulants: Secondary | ICD-10-CM

## 2018-06-08 DIAGNOSIS — I4819 Other persistent atrial fibrillation: Secondary | ICD-10-CM

## 2018-06-08 DIAGNOSIS — Z5181 Encounter for therapeutic drug level monitoring: Secondary | ICD-10-CM

## 2018-06-08 LAB — POCT INR: INR: 2.4 (ref 2.0–3.0)

## 2018-06-08 NOTE — Patient Instructions (Signed)
Description   Continue  taking 1 tablet daily except 1/2 tablet on Tuesdays, Thursdays and Saturdays.  Recheck INR in 4 weeks. Call with any problems  [phone].

## 2018-07-06 ENCOUNTER — Other Ambulatory Visit: Payer: Self-pay | Admitting: Cardiology

## 2018-07-06 ENCOUNTER — Ambulatory Visit (INDEPENDENT_AMBULATORY_CARE_PROVIDER_SITE_OTHER): Payer: Medicare HMO | Admitting: *Deleted

## 2018-07-06 DIAGNOSIS — Z7901 Long term (current) use of anticoagulants: Secondary | ICD-10-CM

## 2018-07-06 DIAGNOSIS — I4819 Other persistent atrial fibrillation: Secondary | ICD-10-CM

## 2018-07-06 DIAGNOSIS — Z5181 Encounter for therapeutic drug level monitoring: Secondary | ICD-10-CM

## 2018-07-06 LAB — POCT INR: INR: 2.6 (ref 2.0–3.0)

## 2018-07-06 NOTE — Patient Instructions (Signed)
Description   Continue taking 1 tablet daily except 1/2 tablet on Tuesdays, Thursdays and Saturdays.  Recheck INR in 6 weeks. Call with any problems  [phone].

## 2018-08-17 ENCOUNTER — Ambulatory Visit (INDEPENDENT_AMBULATORY_CARE_PROVIDER_SITE_OTHER): Payer: Medicare HMO | Admitting: *Deleted

## 2018-08-17 DIAGNOSIS — Z7901 Long term (current) use of anticoagulants: Secondary | ICD-10-CM | POA: Diagnosis not present

## 2018-08-17 DIAGNOSIS — I4819 Other persistent atrial fibrillation: Secondary | ICD-10-CM

## 2018-08-17 DIAGNOSIS — Z5181 Encounter for therapeutic drug level monitoring: Secondary | ICD-10-CM | POA: Diagnosis not present

## 2018-08-17 LAB — POCT INR: INR: 2.5 (ref 2.0–3.0)

## 2018-08-17 NOTE — Patient Instructions (Signed)
Description   Continue taking 1 tablet daily except 1/2 tablet on Tuesdays, Thursdays and Saturdays.  Recheck INR in 6 weeks. Call with any problems  [phone].

## 2018-09-27 ENCOUNTER — Telehealth: Payer: Self-pay

## 2018-09-27 NOTE — Telephone Encounter (Signed)

## 2018-09-28 ENCOUNTER — Ambulatory Visit (INDEPENDENT_AMBULATORY_CARE_PROVIDER_SITE_OTHER): Payer: Medicare HMO | Admitting: *Deleted

## 2018-09-28 ENCOUNTER — Other Ambulatory Visit: Payer: Self-pay

## 2018-09-28 DIAGNOSIS — Z5181 Encounter for therapeutic drug level monitoring: Secondary | ICD-10-CM

## 2018-09-28 DIAGNOSIS — I4819 Other persistent atrial fibrillation: Secondary | ICD-10-CM

## 2018-09-28 DIAGNOSIS — Z7901 Long term (current) use of anticoagulants: Secondary | ICD-10-CM | POA: Diagnosis not present

## 2018-09-28 LAB — POCT INR: INR: 3.2 — AB (ref 2.0–3.0)

## 2018-10-01 ENCOUNTER — Other Ambulatory Visit: Payer: Self-pay | Admitting: Cardiology

## 2018-10-06 ENCOUNTER — Other Ambulatory Visit: Payer: Self-pay | Admitting: Cardiology

## 2018-10-24 ENCOUNTER — Other Ambulatory Visit: Payer: Self-pay | Admitting: Cardiology

## 2018-11-03 ENCOUNTER — Telehealth: Payer: Self-pay

## 2018-11-03 NOTE — Telephone Encounter (Signed)

## 2018-11-04 ENCOUNTER — Other Ambulatory Visit: Payer: Self-pay

## 2018-11-04 ENCOUNTER — Ambulatory Visit (INDEPENDENT_AMBULATORY_CARE_PROVIDER_SITE_OTHER): Payer: Medicare HMO | Admitting: Pharmacist

## 2018-11-04 DIAGNOSIS — I4819 Other persistent atrial fibrillation: Secondary | ICD-10-CM

## 2018-11-04 DIAGNOSIS — Z7901 Long term (current) use of anticoagulants: Secondary | ICD-10-CM

## 2018-11-04 DIAGNOSIS — Z5181 Encounter for therapeutic drug level monitoring: Secondary | ICD-10-CM

## 2018-11-04 LAB — POCT INR: INR: 2.3 (ref 2.0–3.0)

## 2018-12-09 ENCOUNTER — Telehealth: Payer: Self-pay

## 2018-12-09 NOTE — Telephone Encounter (Signed)

## 2018-12-09 NOTE — Telephone Encounter (Signed)
Unable to lmom for prescreen  

## 2018-12-16 ENCOUNTER — Ambulatory Visit (INDEPENDENT_AMBULATORY_CARE_PROVIDER_SITE_OTHER): Payer: Medicare HMO | Admitting: *Deleted

## 2018-12-16 ENCOUNTER — Telehealth: Payer: Self-pay

## 2018-12-16 ENCOUNTER — Other Ambulatory Visit: Payer: Self-pay

## 2018-12-16 ENCOUNTER — Encounter (INDEPENDENT_AMBULATORY_CARE_PROVIDER_SITE_OTHER): Payer: Self-pay

## 2018-12-16 DIAGNOSIS — Z5181 Encounter for therapeutic drug level monitoring: Secondary | ICD-10-CM | POA: Diagnosis not present

## 2018-12-16 DIAGNOSIS — Z7901 Long term (current) use of anticoagulants: Secondary | ICD-10-CM

## 2018-12-16 DIAGNOSIS — I4819 Other persistent atrial fibrillation: Secondary | ICD-10-CM | POA: Diagnosis not present

## 2018-12-16 LAB — POCT INR: INR: 2 (ref 2.0–3.0)

## 2018-12-16 NOTE — Patient Instructions (Addendum)
Description   Today take another 1/2 tablet (1 tablet total for today) then continue taking 1 tablet daily except 1/2 tablet on Tuesdays, Thursdays and Saturdays.  Recheck INR in 6 weeks. Call with any problems  [phone].

## 2018-12-16 NOTE — Telephone Encounter (Signed)

## 2019-01-27 ENCOUNTER — Ambulatory Visit (INDEPENDENT_AMBULATORY_CARE_PROVIDER_SITE_OTHER): Payer: Medicare HMO | Admitting: *Deleted

## 2019-01-27 ENCOUNTER — Other Ambulatory Visit: Payer: Self-pay

## 2019-01-27 DIAGNOSIS — I4819 Other persistent atrial fibrillation: Secondary | ICD-10-CM | POA: Diagnosis not present

## 2019-01-27 DIAGNOSIS — Z5181 Encounter for therapeutic drug level monitoring: Secondary | ICD-10-CM | POA: Diagnosis not present

## 2019-01-27 DIAGNOSIS — Z7901 Long term (current) use of anticoagulants: Secondary | ICD-10-CM

## 2019-01-27 LAB — POCT INR: INR: 2 (ref 2.0–3.0)

## 2019-01-27 NOTE — Patient Instructions (Signed)
Description   Today take another 1/2 tablet (1 tablet total for today) then continue taking 1 tablet daily except 1/2 tablet on Tuesdays, Thursdays and Saturdays.  Recheck INR in 6 weeks. Call with any problems  [phone].

## 2019-02-08 ENCOUNTER — Other Ambulatory Visit: Payer: Self-pay | Admitting: Cardiology

## 2019-03-10 ENCOUNTER — Ambulatory Visit (INDEPENDENT_AMBULATORY_CARE_PROVIDER_SITE_OTHER): Payer: Medicare HMO | Admitting: *Deleted

## 2019-03-10 ENCOUNTER — Other Ambulatory Visit: Payer: Self-pay

## 2019-03-10 DIAGNOSIS — Z7901 Long term (current) use of anticoagulants: Secondary | ICD-10-CM | POA: Diagnosis not present

## 2019-03-10 DIAGNOSIS — I4819 Other persistent atrial fibrillation: Secondary | ICD-10-CM

## 2019-03-10 DIAGNOSIS — Z5181 Encounter for therapeutic drug level monitoring: Secondary | ICD-10-CM

## 2019-03-10 LAB — POCT INR: INR: 2.4 (ref 2.0–3.0)

## 2019-03-10 NOTE — Patient Instructions (Addendum)
Description    Continue taking 1 tablet daily except 1/2 tablet on Tuesdays, Thursdays and Saturdays.  Recheck INR in 6 weeks. Call with any problems  [phone].

## 2019-03-30 ENCOUNTER — Encounter: Payer: Self-pay | Admitting: Cardiology

## 2019-03-30 ENCOUNTER — Other Ambulatory Visit: Payer: Self-pay | Admitting: Cardiology

## 2019-03-30 ENCOUNTER — Other Ambulatory Visit: Payer: Self-pay

## 2019-03-30 ENCOUNTER — Telehealth (INDEPENDENT_AMBULATORY_CARE_PROVIDER_SITE_OTHER): Payer: Medicare HMO | Admitting: Cardiology

## 2019-03-30 VITALS — Ht 62.0 in | Wt 130.0 lb

## 2019-03-30 DIAGNOSIS — I4819 Other persistent atrial fibrillation: Secondary | ICD-10-CM

## 2019-03-30 DIAGNOSIS — I2510 Atherosclerotic heart disease of native coronary artery without angina pectoris: Secondary | ICD-10-CM

## 2019-03-30 DIAGNOSIS — I050 Rheumatic mitral stenosis: Secondary | ICD-10-CM | POA: Diagnosis not present

## 2019-03-30 DIAGNOSIS — I351 Nonrheumatic aortic (valve) insufficiency: Secondary | ICD-10-CM

## 2019-03-30 DIAGNOSIS — E7800 Pure hypercholesterolemia, unspecified: Secondary | ICD-10-CM

## 2019-03-30 DIAGNOSIS — I5032 Chronic diastolic (congestive) heart failure: Secondary | ICD-10-CM

## 2019-03-30 DIAGNOSIS — I10 Essential (primary) hypertension: Secondary | ICD-10-CM

## 2019-03-30 DIAGNOSIS — I110 Hypertensive heart disease with heart failure: Secondary | ICD-10-CM

## 2019-03-30 NOTE — Patient Instructions (Signed)
Medication Instructions:   Your physician recommends that you continue on your current medications as directed. Please refer to the Current Medication list given to you today.  If you need a refill on your cardiac medications before your next appointment, please call your pharmacy.    Lab work:  IN THE NEAR FUTURE--SAME DAY AS YOUR ECHO WILL BE SCHEDULED--YOU WILL NEED TO HAVE LIPIDS, ALT, AND A BMET DONE--A SCHEDULER FROM OUR OFFICE WILL BE CALLING YOU TO HAVE THESE APPOINTMENTS ARRANGED. --YOU WILL NEED TO COME FASTING TO THIS LAB APPOINTMENT.   If you have labs (blood work) drawn today and your tests are completely normal, you will receive your results only by: Marland Kitchen MyChart Message (if you have MyChart) OR . A paper copy in the mail If you have any lab test that is abnormal or we need to change your treatment, we will call you to review the results.   Testing/Procedures:  Your physician has requested that you have an echocardiogram. Echocardiography is a painless test that uses sound waves to create images of your heart. It provides your doctor with information about the size and shape of your heart and how well your heart's chambers and valves are working. This procedure takes approximately one hour. There are no restrictions for this procedure.  WE WILL MAKE THIS SAME DAY AS YOUR LAB APPOINTMENT IF POSSIBLE--A SCHEDULER WILL BE CALLING YOU TO HAVE THIS ARRANGED.     Follow-Up: At [facility], you and your health needs are our priority.  As part of our continuing mission to provide you with exceptional heart care, we have created designated Provider Care Teams.  These Care Teams include your primary Cardiologist (physician) and Advanced Practice Providers (APPs -  Physician Assistants and Nurse Practitioners) who all work together to provide you with the care you need, when you need it. You will need a follow up appointment in 12 months with Dr. Radford Pax.  Please call our office 2 months  in advance to schedule this appointment.  You may see  or one of the following Advanced Practice Providers on your designated Care Team:   Lyda Jester, PA-C Melina Copa, PA-C . Ermalinda Barrios, PA-C

## 2019-03-30 NOTE — Addendum Note (Signed)
Addended by: Nuala Alpha on: 03/30/2019 03:20 PM   Modules accepted: Orders

## 2019-03-30 NOTE — Progress Notes (Signed)
Virtual Visit via Telephone Note   This visit type was conducted due to national recommendations for restrictions regarding the COVID-19 Pandemic (e.g. social distancing) in an effort to limit this patient's exposure and mitigate transmission in our community.  Due to her co-morbid illnesses, this patient is at least at moderate risk for complications without adequate follow up.  This format is felt to be most appropriate for this patient at this time.  The patient did not have access to video technology/had technical difficulties with video requiring transitioning to audio format only (telephone).  All issues noted in this document were discussed and addressed.  No physical exam could be performed with this format.  Please refer to the patient's chart for her  consent to telehealth for [facility].  Evaluation Performed:  Follow-up visit  This visit type was conducted due to national recommendations for restrictions regarding the COVID-19 Pandemic (e.g. social distancing).  This format is felt to be most appropriate for this patient at this time.  All issues noted in this document were discussed and addressed.  No physical exam was performed (except for noted visual exam findings with Video Visits).  Please refer to the patient's chart (MyChart message for video visits and phone note for telephone visits) for the patient's consent to telehealth for [facility].  Date:  03/30/2019   Ana [name], DOB 11-27-1931, MRN [id number]  Patient Location:  Home  Provider location:   [place]  PCP:  London Pepper, MD  Cardiologist:  Fransico Him, MD Electrophysiologist:  None   Chief Complaint:  CAD, atrial flutter, HLD, HTN, CHF  History of Present Illness:    Ana [name] is a 85 y.o. female who presents via audio/video conferencing for a telehealth visit today.    Ana [name] is a 85 y.o. female with a hx of permanent atrial flutter,CAD(multivessel CAD s/p PCI with BMS to the  LAD and CFX and subsequent staged PCI with BMS to the RCA),chronicdiastolic CHF, pulmonary HTN, minimalmitral stenosis (echo 09/2012), HTN, HL and anemia of chronic disease.   She is here today for followup and is doing well.  She denies any chest pain or pressure, SOB, DOE, PND, orthopnea, LE edema, dizziness, palpitations or syncope. She is compliant with her meds and is tolerating meds with no SE.    The patient does not have symptoms concerning for COVID-19 infection (fever, chills, cough, or new shortness of breath).    Prior CV studies:   The following studies were reviewed today:  none  Past Medical History:  Diagnosis Date  . Anemia of chronic disease   . Aortic insufficiency    moderate by echo 03/2018  . Chronic diastolic CHF (congestive heart failure) ([place]) 2014  . Diabetes mellitus without complication ([place])   . Dizziness 02/13/2015  . Hyperlipidemia   . Hypertension   . Hypothyroidism   . MI, old 07/18/2013  . Mitral stenosis    mild by echo 03/2018  . Permanent atrial fibrillation ([place])   . Pulmonary HTN ([place])    PASP 9mmHg 03/2018  . Urinary incontinence   . Vitamin D deficiency disease    Past Surgical History:  Procedure Laterality Date  . CATARACT EXTRACTION    . LAPAROSCOPIC APPENDECTOMY N/A 04/14/2017   Procedure: APPENDECTOMY LAPAROSCOPIC;  Surgeon: Fanny Skates, MD;  Location: [facility] ORS;  Service: General;  Laterality: N/A;  . LEFT HEART CATHETERIZATION WITH CORONARY ANGIOGRAM N/A 01/17/2014   Procedure: LEFT HEART CATHETERIZATION WITH CORONARY  ANGIOGRAM;  Surgeon: Troy Sine, MD;  Location: [facility] CATH LAB;  Service: Cardiovascular;  Laterality: N/A;  . PERCUTANEOUS CORONARY STENT INTERVENTION (PCI-S)  01/17/2014   Procedure: PERCUTANEOUS CORONARY STENT INTERVENTION (PCI-S);  Surgeon: Troy Sine, MD;  Location: [facility] CATH LAB;  Service: Cardiovascular;;  . PERCUTANEOUS CORONARY STENT INTERVENTION (PCI-S) N/A 01/19/2014   Procedure: PERCUTANEOUS  CORONARY STENT INTERVENTION (PCI-S);  Surgeon: Leonie Man, MD;  Location: [facility] CATH LAB;  Service: Cardiovascular;  Laterality: N/A;     Current Meds  Medication Sig  . acetaminophen (TYLENOL) 500 MG tablet Take 500 mg by mouth every 8 (eight) hours as needed for moderate pain or headache.   Marland Kitchen amLODipine (NORVASC) 5 MG tablet TAKE 1 TABLET BY MOUTH ONCE DAILY  . aspirin EC 81 MG tablet Take 81 mg by mouth daily.  . Cholecalciferol (VITAMIN D3) 2000 UNITS TABS Take 2,000 Units by mouth daily.   . Coenzyme Q10 200 MG capsule Take 200 mg by mouth daily.  . Ferrous Sulfate (SLOW FE PO) Take 1 tablet by mouth daily.  Marland Kitchen levothyroxine (SYNTHROID, LEVOTHROID) 75 MCG tablet Take 75 mcg by mouth daily before breakfast.   . losartan (COZAAR) 100 MG tablet Take 1 tablet by mouth once daily  . metFORMIN (GLUCOPHAGE) 1000 MG tablet Take 1 tablet (1,000 mg total) by mouth 2 (two) times daily with a meal. (Patient taking differently: Take 1,000 mg by mouth daily. )  . Omega-3 Krill Oil 500 MG CAPS Take 500 mg by mouth daily.   . phenylephrine-shark liver oil-mineral oil-petrolatum (PREPARATION H) 0.25-3-14-71.9 % rectal ointment Place 1 application rectally 2 (two) times daily as needed for hemorrhoids.  . polyethylene glycol (MIRALAX / GLYCOLAX) packet Take 17 g by mouth daily as needed for moderate constipation.   . pravastatin (PRAVACHOL) 20 MG tablet TAKE 1 TABLET BY MOUTH ONCE DAILY IN THE EVENING  . vitamin B-12 (CYANOCOBALAMIN) 1000 MCG tablet Take 1,000 mcg by mouth daily.  Marland Kitchen warfarin (COUMADIN) 2.5 MG tablet TAKE 1/2 TO 1 (ONE-HALF TO ONE) TABLET BY MOUTH ONCE DAILY AS DIRECTED BY COUMADIN CLINIC     Allergies:   Welchol [colesevelam hcl], Zetia [ezetimibe], Statins, Crestor [rosuvastatin], and Zocor [simvastatin]   Social History   Tobacco Use  . Smoking status: Never Smoker  . Smokeless tobacco: Never Used  Substance Use Topics  . Alcohol use: No  . Drug use: No     Family Hx: The  patient's family history includes Cancer in her mother; Cancer (age of onset: 37) in her sister and another family member; Cirrhosis in her father; Diabetes in her brother; Heart attack in her sister; Heart disease in her sister.  ROS:   Please see the history of present illness.     All other systems reviewed and are negative.   Labs/Other Tests and Data Reviewed:    Recent Labs: No results found for requested labs within last 8760 hours.   Recent Lipid Panel Lab Results  Component Value Date/Time   CHOL 154 07/09/2017 11:08 AM   TRIG 40 07/09/2017 11:08 AM   HDL 57 07/09/2017 11:08 AM   CHOLHDL 2.7 07/09/2017 11:08 AM   CHOLHDL 3.2 06/02/2016 11:01 AM   LDLCALC 89 07/09/2017 11:08 AM    Wt Readings from Last 3 Encounters:  03/30/19 130 lb (59 kg)  03/18/18 134 lb 12.8 oz (61.1 kg)  07/09/17 125 lb (56.7 kg)     Objective:    Vital Signs:  Ht 5\' 2"  (  1.575 m)   Wt 130 lb (59 kg)   BMI 23.78 kg/m     ASSESSMENT & PLAN:    1.  Chronic diastolic CHF -she denies any SOB or LE edema and weight is stable -she has not required any diuretics  2.  ASCAD -multivessel CAD s/p PCI with BMS to the LAD and CFX and subsequent staged PCI with BMS to the RCA. -she has not had any anginal sx -continue ASA and statin  3.  HTN -continue Losartan 100mg  daily and amlodipine 5mg  daily.  -Check BMET  4.  Permanent atrial fibrillation -she has not required any CCB or BB -continue warfarin - followed in our Coumadin clinic  5.  Hyperlipidemia -LDL goal < 70 -check FLP and ALT -continue Pravastatin 20mg  daily  6.  Mitral stenosis -very mild by echo 03/2018 with mean MVG 49mmHg  7.  Moderate AR -repeat echo to make sure this is stable  COVID-19 Education: The signs and symptoms of COVID-19 were discussed with the patient and how to seek care for testing (follow up with PCP or arrange E-visit).  The importance of social distancing was discussed today.  Patient Risk:   After  full review of this patient's clinical status, I feel that they are at least moderate risk at this time.  Time:   Today, I have spent 20 minutes directly with the patient on telemedicine discussing medical problems including CAD, CHF, HTN, AR, MS, HLD.  We also reviewed the symptoms of COVID 19 and the ways to protect against contracting the virus with telehealth technology.  I spent an additional 5 minutes reviewing patient's chart including 2D echo, labs.  Medication Adjustments/Labs and Tests Ordered: Current medicines are reviewed at length with the patient today.  Concerns regarding medicines are outlined above.  Tests Ordered: No orders of the defined types were placed in this encounter.  Medication Changes: No orders of the defined types were placed in this encounter.   Disposition:  Follow up in 1 year(s)  Signed, Fransico Him, MD  03/30/2019 2:57 PM    [place]

## 2019-04-01 ENCOUNTER — Encounter (HOSPITAL_COMMUNITY): Payer: Self-pay | Admitting: Cardiology

## 2019-04-06 ENCOUNTER — Other Ambulatory Visit: Payer: Self-pay | Admitting: Cardiology

## 2019-04-07 ENCOUNTER — Other Ambulatory Visit: Payer: Self-pay | Admitting: Cardiology

## 2019-04-08 MED ORDER — PRAVASTATIN SODIUM 20 MG PO TABS: 20.0000 mg | ORAL_TABLET | Freq: Every evening | ORAL | 3 refills | Status: DC

## 2019-04-08 NOTE — Addendum Note (Signed)
Addended by: Derl Barrow on: 04/08/2019 01:18 PM   Modules accepted: Orders

## 2019-04-21 ENCOUNTER — Ambulatory Visit (HOSPITAL_COMMUNITY): Payer: Medicare HMO | Attending: Cardiovascular Disease

## 2019-04-21 ENCOUNTER — Telehealth: Payer: Self-pay

## 2019-04-21 ENCOUNTER — Other Ambulatory Visit: Payer: Medicare HMO

## 2019-04-21 ENCOUNTER — Other Ambulatory Visit (HOSPITAL_COMMUNITY): Payer: Medicare HMO

## 2019-04-21 ENCOUNTER — Other Ambulatory Visit: Payer: Self-pay

## 2019-04-21 ENCOUNTER — Ambulatory Visit (INDEPENDENT_AMBULATORY_CARE_PROVIDER_SITE_OTHER): Payer: Medicare HMO | Admitting: Pharmacist

## 2019-04-21 ENCOUNTER — Other Ambulatory Visit: Payer: Medicare HMO | Admitting: *Deleted

## 2019-04-21 DIAGNOSIS — I4819 Other persistent atrial fibrillation: Secondary | ICD-10-CM | POA: Insufficient documentation

## 2019-04-21 DIAGNOSIS — I050 Rheumatic mitral stenosis: Secondary | ICD-10-CM | POA: Diagnosis not present

## 2019-04-21 DIAGNOSIS — Z5181 Encounter for therapeutic drug level monitoring: Secondary | ICD-10-CM

## 2019-04-21 DIAGNOSIS — I351 Nonrheumatic aortic (valve) insufficiency: Secondary | ICD-10-CM

## 2019-04-21 DIAGNOSIS — I10 Essential (primary) hypertension: Secondary | ICD-10-CM

## 2019-04-21 DIAGNOSIS — I5032 Chronic diastolic (congestive) heart failure: Secondary | ICD-10-CM | POA: Insufficient documentation

## 2019-04-21 DIAGNOSIS — I2510 Atherosclerotic heart disease of native coronary artery without angina pectoris: Secondary | ICD-10-CM | POA: Diagnosis not present

## 2019-04-21 DIAGNOSIS — E7800 Pure hypercholesterolemia, unspecified: Secondary | ICD-10-CM

## 2019-04-21 DIAGNOSIS — Z7901 Long term (current) use of anticoagulants: Secondary | ICD-10-CM | POA: Diagnosis not present

## 2019-04-21 LAB — BASIC METABOLIC PANEL
BUN/Creatinine Ratio: 17 (ref 12–28)
BUN: 22 mg/dL (ref 8–27)
CO2: 21 mmol/L (ref 20–29)
Calcium: 8.7 mg/dL (ref 8.7–10.3)
Chloride: 104 mmol/L (ref 96–106)
Creatinine, Ser: 1.3 mg/dL — ABNORMAL HIGH (ref 0.57–1.00)
GFR calc Af Amer: 43 mL/min/{1.73_m2} — ABNORMAL LOW (ref >59)
GFR calc non Af Amer: 37 mL/min/{1.73_m2} — ABNORMAL LOW (ref >59)
Glucose: 117 mg/dL — ABNORMAL HIGH (ref 65–99)
Potassium: 4.8 mmol/L (ref 3.5–5.2)
Sodium: 139 mmol/L (ref 134–144)

## 2019-04-21 LAB — ALT: ALT: 12 IU/L (ref 0–32)

## 2019-04-21 LAB — LIPID PANEL
Chol/HDL Ratio: 2.6 ratio (ref 0.0–4.4)
Cholesterol, Total: 124 mg/dL (ref 100–199)
HDL: 47 mg/dL (ref >39)
LDL Chol Calc (NIH): 62 mg/dL (ref 0–99)
Triglycerides: 71 mg/dL (ref 0–149)
VLDL Cholesterol Cal: 15 mg/dL (ref 5–40)

## 2019-04-21 LAB — POCT INR: INR: 2.4 (ref 2.0–3.0)

## 2019-04-21 NOTE — Telephone Encounter (Signed)
Notes recorded by Frederik Schmidt, RN on 04/21/2019 at 3:36 PM EDT  The patient has been notified of the Echo result and verbalized understanding. All questions (if any) were answered.  Frederik Schmidt, RN 04/21/2019 3:36 PM

## 2019-04-21 NOTE — Patient Instructions (Signed)
Description   Continue taking 1 tablet daily except 1/2 tablet on Tuesdays, Thursdays and Saturdays.  Recheck INR in 6 weeks. Call with any problems  [phone].

## 2019-04-21 NOTE — Telephone Encounter (Signed)
-----   Message from Sueanne Margarita, MD sent at 04/21/2019  2:57 PM EDT ----- Please let patient know that echo showed normal LVF with midly reduced RV function, moderately calcified MV with mild leakiness, mildly thickened AV

## 2019-04-22 ENCOUNTER — Telehealth: Payer: Self-pay

## 2019-04-22 MED ORDER — PRAVASTATIN SODIUM 20 MG PO TABS: 20.0000 mg | ORAL_TABLET | Freq: Every evening | ORAL | 3 refills | Status: DC

## 2019-04-22 NOTE — Telephone Encounter (Signed)
-----   Message from Sueanne Margarita, MD sent at 04/21/2019  5:36 PM EDT ----- Creatinine mildly elevated - please make sure she is not taking any NSAIDs and increase fluid intake - forward to PCP

## 2019-04-22 NOTE — Telephone Encounter (Signed)
Notes recorded by Frederik Schmidt, RN on 04/22/2019 at 8:11 AM EDT  The patient has been notified of the result and verbalized understanding. All questions (if any) were answered.  Frederik Schmidt, RN 04/22/2019 8:11 AM

## 2019-05-09 DIAGNOSIS — E1169 Type 2 diabetes mellitus with other specified complication: Secondary | ICD-10-CM | POA: Diagnosis not present

## 2019-05-09 DIAGNOSIS — I2510 Atherosclerotic heart disease of native coronary artery without angina pectoris: Secondary | ICD-10-CM | POA: Diagnosis not present

## 2019-05-09 DIAGNOSIS — R300 Dysuria: Secondary | ICD-10-CM | POA: Diagnosis not present

## 2019-05-09 DIAGNOSIS — I10 Essential (primary) hypertension: Secondary | ICD-10-CM | POA: Diagnosis not present

## 2019-05-09 DIAGNOSIS — E785 Hyperlipidemia, unspecified: Secondary | ICD-10-CM | POA: Diagnosis not present

## 2019-05-09 DIAGNOSIS — I4891 Unspecified atrial fibrillation: Secondary | ICD-10-CM | POA: Diagnosis not present

## 2019-05-09 DIAGNOSIS — D649 Anemia, unspecified: Secondary | ICD-10-CM | POA: Diagnosis not present

## 2019-05-09 DIAGNOSIS — I5032 Chronic diastolic (congestive) heart failure: Secondary | ICD-10-CM | POA: Diagnosis not present

## 2019-05-09 DIAGNOSIS — E039 Hypothyroidism, unspecified: Secondary | ICD-10-CM | POA: Diagnosis not present

## 2019-05-29 ENCOUNTER — Other Ambulatory Visit: Payer: Self-pay | Admitting: Cardiology

## 2019-06-02 ENCOUNTER — Ambulatory Visit (INDEPENDENT_AMBULATORY_CARE_PROVIDER_SITE_OTHER): Payer: Medicare HMO | Admitting: *Deleted

## 2019-06-02 ENCOUNTER — Other Ambulatory Visit: Payer: Self-pay

## 2019-06-02 DIAGNOSIS — I4819 Other persistent atrial fibrillation: Secondary | ICD-10-CM

## 2019-06-02 DIAGNOSIS — Z5181 Encounter for therapeutic drug level monitoring: Secondary | ICD-10-CM | POA: Diagnosis not present

## 2019-06-02 DIAGNOSIS — R300 Dysuria: Secondary | ICD-10-CM | POA: Diagnosis not present

## 2019-06-02 DIAGNOSIS — D649 Anemia, unspecified: Secondary | ICD-10-CM | POA: Diagnosis not present

## 2019-06-02 DIAGNOSIS — Z7901 Long term (current) use of anticoagulants: Secondary | ICD-10-CM | POA: Diagnosis not present

## 2019-06-02 DIAGNOSIS — I4891 Unspecified atrial fibrillation: Secondary | ICD-10-CM | POA: Diagnosis not present

## 2019-06-02 DIAGNOSIS — E039 Hypothyroidism, unspecified: Secondary | ICD-10-CM | POA: Diagnosis not present

## 2019-06-02 DIAGNOSIS — E785 Hyperlipidemia, unspecified: Secondary | ICD-10-CM | POA: Diagnosis not present

## 2019-06-02 DIAGNOSIS — E1169 Type 2 diabetes mellitus with other specified complication: Secondary | ICD-10-CM | POA: Diagnosis not present

## 2019-06-02 LAB — POCT INR: INR: 1.4 — AB (ref 2.0–3.0)

## 2019-06-02 NOTE — Patient Instructions (Signed)
Description   Take 1 tablet today and 1.5 tablets tomorrow, then Continue taking 1 tablet daily except 1/2 tablet on Tuesdays, Thursdays and Saturdays.  Recheck INR in 1 week. Call with any problems  [phone].

## 2019-06-09 ENCOUNTER — Ambulatory Visit (INDEPENDENT_AMBULATORY_CARE_PROVIDER_SITE_OTHER): Payer: Medicare HMO | Admitting: *Deleted

## 2019-06-09 ENCOUNTER — Other Ambulatory Visit: Payer: Self-pay

## 2019-06-09 DIAGNOSIS — I4819 Other persistent atrial fibrillation: Secondary | ICD-10-CM | POA: Diagnosis not present

## 2019-06-09 DIAGNOSIS — Z7901 Long term (current) use of anticoagulants: Secondary | ICD-10-CM

## 2019-06-09 DIAGNOSIS — Z5181 Encounter for therapeutic drug level monitoring: Secondary | ICD-10-CM

## 2019-06-09 LAB — POCT INR: INR: 1.9 — AB (ref 2.0–3.0)

## 2019-06-09 NOTE — Patient Instructions (Addendum)
Description   Start taking 1 tablet daily except 1/2 tablet on Tuesdays and Saturdays.  Recheck INR in 2 weeks. Call with any problems  [phone].

## 2019-07-01 ENCOUNTER — Other Ambulatory Visit: Payer: Self-pay | Admitting: Cardiology

## 2019-07-15 ENCOUNTER — Encounter (INDEPENDENT_AMBULATORY_CARE_PROVIDER_SITE_OTHER): Payer: Self-pay

## 2019-07-15 ENCOUNTER — Ambulatory Visit (INDEPENDENT_AMBULATORY_CARE_PROVIDER_SITE_OTHER): Payer: Medicare HMO | Admitting: *Deleted

## 2019-07-15 ENCOUNTER — Other Ambulatory Visit: Payer: Self-pay

## 2019-07-15 DIAGNOSIS — Z7901 Long term (current) use of anticoagulants: Secondary | ICD-10-CM

## 2019-07-15 DIAGNOSIS — I4819 Other persistent atrial fibrillation: Secondary | ICD-10-CM

## 2019-07-15 DIAGNOSIS — Z5181 Encounter for therapeutic drug level monitoring: Secondary | ICD-10-CM

## 2019-07-15 LAB — POCT INR: INR: 2.6 (ref 2.0–3.0)

## 2019-07-15 NOTE — Patient Instructions (Signed)
Description   Continue taking 1 tablet daily except 1/2 tablet on Tuesdays and Saturdays.  Recheck INR in 5 weeks. Call with any problems  [phone].

## 2019-07-20 ENCOUNTER — Telehealth: Payer: Self-pay | Admitting: Cardiology

## 2019-07-20 NOTE — Telephone Encounter (Signed)
We are recommending the COVID-19 vaccine to all of our patients. Cardiac medications (including blood thinners) should not deter anyone from being vaccinated and there is no need to hold any of those medications prior to vaccine administration.     Currently, there is a hotline to call (active 07/01/19) to schedule vaccination appointments as no walk-ins will be accepted.   Number: [phone].    If an appointment is not available please go to [email or web address] to sign up for notification when additional vaccine appointments are available.   If you have further questions or concerns about the vaccine process, please visit [email or web address] or contact your primary care physician.  Patient verbalized understanding. 

## 2019-07-22 ENCOUNTER — Ambulatory Visit: Payer: Medicare HMO

## 2019-08-18 ENCOUNTER — Ambulatory Visit (INDEPENDENT_AMBULATORY_CARE_PROVIDER_SITE_OTHER): Payer: Medicare HMO | Admitting: *Deleted

## 2019-08-18 ENCOUNTER — Other Ambulatory Visit: Payer: Self-pay

## 2019-08-18 DIAGNOSIS — Z5181 Encounter for therapeutic drug level monitoring: Secondary | ICD-10-CM | POA: Diagnosis not present

## 2019-08-18 DIAGNOSIS — I4819 Other persistent atrial fibrillation: Secondary | ICD-10-CM

## 2019-08-18 DIAGNOSIS — Z7901 Long term (current) use of anticoagulants: Secondary | ICD-10-CM

## 2019-08-18 LAB — POCT INR: INR: 2.5 (ref 2.0–3.0)

## 2019-08-18 NOTE — Patient Instructions (Signed)
Description   Continue taking 1 tablet daily except 1/2 tablet on Tuesdays and Saturdays.  Recheck INR in 6 weeks. Call with any problems  [phone].

## 2019-09-16 ENCOUNTER — Other Ambulatory Visit: Payer: Self-pay | Admitting: Cardiology

## 2019-09-29 ENCOUNTER — Other Ambulatory Visit: Payer: Self-pay

## 2019-09-29 ENCOUNTER — Ambulatory Visit (INDEPENDENT_AMBULATORY_CARE_PROVIDER_SITE_OTHER): Payer: Medicare HMO | Admitting: Pharmacist

## 2019-09-29 DIAGNOSIS — Z5181 Encounter for therapeutic drug level monitoring: Secondary | ICD-10-CM

## 2019-09-29 DIAGNOSIS — I4819 Other persistent atrial fibrillation: Secondary | ICD-10-CM | POA: Diagnosis not present

## 2019-09-29 DIAGNOSIS — Z7901 Long term (current) use of anticoagulants: Secondary | ICD-10-CM | POA: Diagnosis not present

## 2019-09-29 LAB — POCT INR: INR: 2.8 (ref 2.0–3.0)

## 2019-09-29 NOTE — Patient Instructions (Signed)
Description   Continue taking 1 tablet daily except 1/2 tablet on Tuesdays and Saturdays.  Recheck INR in 8 weeks. Call with any problems  [phone].

## 2019-11-25 DIAGNOSIS — E119 Type 2 diabetes mellitus without complications: Secondary | ICD-10-CM | POA: Diagnosis not present

## 2019-11-25 DIAGNOSIS — N3000 Acute cystitis without hematuria: Secondary | ICD-10-CM | POA: Diagnosis not present

## 2019-12-22 ENCOUNTER — Other Ambulatory Visit: Payer: Self-pay

## 2019-12-22 ENCOUNTER — Ambulatory Visit (INDEPENDENT_AMBULATORY_CARE_PROVIDER_SITE_OTHER): Payer: Medicare HMO

## 2019-12-22 DIAGNOSIS — I4819 Other persistent atrial fibrillation: Secondary | ICD-10-CM

## 2019-12-22 DIAGNOSIS — Z5181 Encounter for therapeutic drug level monitoring: Secondary | ICD-10-CM

## 2019-12-22 DIAGNOSIS — Z7901 Long term (current) use of anticoagulants: Secondary | ICD-10-CM

## 2019-12-22 LAB — POCT INR: INR: 2.2 (ref 2.0–3.0)

## 2019-12-22 NOTE — Patient Instructions (Signed)
Continue taking 1 tablet daily except 1/2 tablet on Tuesdays and Saturdays.  Recheck INR in 8 weeks. Call with any problems  [phone].

## 2019-12-27 ENCOUNTER — Other Ambulatory Visit: Payer: Self-pay | Admitting: Cardiology

## 2020-02-22 ENCOUNTER — Telehealth: Payer: Self-pay | Admitting: *Deleted

## 2020-02-22 NOTE — Telephone Encounter (Signed)
Called pt since she is overdue for her Anticoagulation Appt. The phone went straight to voicemail and it stated the pt does not have voicemail set up. Will try back later.

## 2020-03-07 ENCOUNTER — Telehealth: Payer: Self-pay | Admitting: *Deleted

## 2020-03-07 NOTE — Telephone Encounter (Signed)
Pt is overdue for an INR check; called pt the phone rang once and the voicemail has not been set up. Called the pt's son, Rodman Key, that is listed and had to leave a message for them to call back regarding the pt rescheduling her appt to be seen. Will await.

## 2020-03-07 NOTE — Telephone Encounter (Signed)
Rodman Key, the son, called back and stated that he is her ride and he had Covid 19 and he just started back to some normal things/recovery. Advised of the importance of having her INR checked and made him aware of stroke, clot, bleeding with not having INR monitored and he verbalized understanding. He states he does not have a calendar in front of him but he will be able to call back tomorrow afternoon to get an appt made. Will await a call back for the pt to obtain an appt.

## 2020-03-14 ENCOUNTER — Ambulatory Visit (INDEPENDENT_AMBULATORY_CARE_PROVIDER_SITE_OTHER): Payer: Medicare HMO | Admitting: *Deleted

## 2020-03-14 ENCOUNTER — Other Ambulatory Visit: Payer: Self-pay

## 2020-03-14 DIAGNOSIS — Z5181 Encounter for therapeutic drug level monitoring: Secondary | ICD-10-CM

## 2020-03-14 DIAGNOSIS — I4819 Other persistent atrial fibrillation: Secondary | ICD-10-CM | POA: Diagnosis not present

## 2020-03-14 DIAGNOSIS — Z7901 Long term (current) use of anticoagulants: Secondary | ICD-10-CM

## 2020-03-14 LAB — POCT INR: INR: 4 — AB (ref 2.0–3.0)

## 2020-03-14 NOTE — Patient Instructions (Signed)
Description    Hold warfarin tomorrow, then continue taking 1 tablet daily except 1/2 tablet on Tuesdays and Saturdays. Continue to eat 1 serving of greens per week. Recheck INR in 3 weeks. Call with any problems  [phone].

## 2020-04-04 ENCOUNTER — Other Ambulatory Visit: Payer: Self-pay

## 2020-04-04 ENCOUNTER — Ambulatory Visit (INDEPENDENT_AMBULATORY_CARE_PROVIDER_SITE_OTHER): Payer: Medicare HMO | Admitting: *Deleted

## 2020-04-04 DIAGNOSIS — Z5181 Encounter for therapeutic drug level monitoring: Secondary | ICD-10-CM | POA: Diagnosis not present

## 2020-04-04 DIAGNOSIS — Z7901 Long term (current) use of anticoagulants: Secondary | ICD-10-CM

## 2020-04-04 DIAGNOSIS — I4819 Other persistent atrial fibrillation: Secondary | ICD-10-CM

## 2020-04-04 LAB — POCT INR: INR: 4.4 — AB (ref 2.0–3.0)

## 2020-04-04 NOTE — Patient Instructions (Signed)
Description    Hold warfarin today and tomorrow, then start taking 1 tablet daily except 1/2 tablet on Tuesdays, Thursdays and Saturdays. Continue to eat 1 serving of greens per week. Recheck INR in 2 weeks. Call with any problems  [phone].

## 2020-04-18 ENCOUNTER — Ambulatory Visit (INDEPENDENT_AMBULATORY_CARE_PROVIDER_SITE_OTHER): Payer: Medicare HMO | Admitting: *Deleted

## 2020-04-18 ENCOUNTER — Other Ambulatory Visit: Payer: Self-pay

## 2020-04-18 DIAGNOSIS — I4819 Other persistent atrial fibrillation: Secondary | ICD-10-CM | POA: Diagnosis not present

## 2020-04-18 DIAGNOSIS — Z7901 Long term (current) use of anticoagulants: Secondary | ICD-10-CM | POA: Diagnosis not present

## 2020-04-18 DIAGNOSIS — Z5181 Encounter for therapeutic drug level monitoring: Secondary | ICD-10-CM

## 2020-04-18 LAB — POCT INR: INR: 2.8 (ref 2.0–3.0)

## 2020-04-18 NOTE — Patient Instructions (Signed)
Description   Continue taking 1 tablet daily except 1/2 tablet on Tuesdays, Thursdays, and Saturdays. Continue taking 1 serving of greens per week. Recheck INR in 3 weeks. Call with any problems  [phone].

## 2020-04-23 ENCOUNTER — Other Ambulatory Visit: Payer: Self-pay | Admitting: Cardiology

## 2020-04-25 ENCOUNTER — Other Ambulatory Visit: Payer: Self-pay | Admitting: Cardiology

## 2020-04-25 DIAGNOSIS — Z5181 Encounter for therapeutic drug level monitoring: Secondary | ICD-10-CM

## 2020-04-25 DIAGNOSIS — Z7901 Long term (current) use of anticoagulants: Secondary | ICD-10-CM

## 2020-04-25 DIAGNOSIS — I4819 Other persistent atrial fibrillation: Secondary | ICD-10-CM

## 2020-05-09 ENCOUNTER — Other Ambulatory Visit: Payer: Self-pay

## 2020-05-09 ENCOUNTER — Ambulatory Visit (INDEPENDENT_AMBULATORY_CARE_PROVIDER_SITE_OTHER): Payer: Medicare HMO | Admitting: *Deleted

## 2020-05-09 DIAGNOSIS — Z5181 Encounter for therapeutic drug level monitoring: Secondary | ICD-10-CM | POA: Diagnosis not present

## 2020-05-09 DIAGNOSIS — Z7901 Long term (current) use of anticoagulants: Secondary | ICD-10-CM | POA: Diagnosis not present

## 2020-05-09 DIAGNOSIS — I4819 Other persistent atrial fibrillation: Secondary | ICD-10-CM | POA: Diagnosis not present

## 2020-05-09 LAB — POCT INR: INR: 3.2 — AB (ref 2.0–3.0)

## 2020-05-09 NOTE — Patient Instructions (Signed)
Description   Do not take any Warfarin tomorrow (already taken today's dose) then continue taking 1 tablet daily except 1/2 tablet on Tuesdays, Thursdays, and Saturdays. Continue taking 1 serving of greens per week. Recheck INR in 3 weeks. Call with any problems  [phone].

## 2020-05-15 ENCOUNTER — Other Ambulatory Visit: Payer: Self-pay | Admitting: Cardiology

## 2020-05-31 ENCOUNTER — Other Ambulatory Visit: Payer: Self-pay

## 2020-05-31 ENCOUNTER — Ambulatory Visit (INDEPENDENT_AMBULATORY_CARE_PROVIDER_SITE_OTHER): Payer: Medicare HMO | Admitting: Pharmacist

## 2020-05-31 DIAGNOSIS — I4819 Other persistent atrial fibrillation: Secondary | ICD-10-CM | POA: Diagnosis not present

## 2020-05-31 DIAGNOSIS — Z5181 Encounter for therapeutic drug level monitoring: Secondary | ICD-10-CM

## 2020-05-31 DIAGNOSIS — Z7901 Long term (current) use of anticoagulants: Secondary | ICD-10-CM | POA: Diagnosis not present

## 2020-05-31 LAB — POCT INR: INR: 2.6 (ref 2.0–3.0)

## 2020-05-31 NOTE — Patient Instructions (Signed)
Continue taking 1 tablet daily except 1/2 tablet on Tuesdays, Thursdays, and Saturdays. Continue taking 1 serving of greens per week. Recheck INR in 4 weeks. Call with any problems  [phone].

## 2020-06-18 ENCOUNTER — Other Ambulatory Visit: Payer: Self-pay | Admitting: Cardiology

## 2020-06-28 ENCOUNTER — Ambulatory Visit (INDEPENDENT_AMBULATORY_CARE_PROVIDER_SITE_OTHER): Payer: Medicare HMO | Admitting: *Deleted

## 2020-06-28 ENCOUNTER — Other Ambulatory Visit: Payer: Self-pay

## 2020-06-28 DIAGNOSIS — I4819 Other persistent atrial fibrillation: Secondary | ICD-10-CM | POA: Diagnosis not present

## 2020-06-28 DIAGNOSIS — Z5181 Encounter for therapeutic drug level monitoring: Secondary | ICD-10-CM

## 2020-06-28 DIAGNOSIS — Z7901 Long term (current) use of anticoagulants: Secondary | ICD-10-CM | POA: Diagnosis not present

## 2020-06-28 LAB — POCT INR: INR: 2.5 (ref 2.0–3.0)

## 2020-06-28 NOTE — Patient Instructions (Signed)
Description   Continue taking 1 tablet daily except 1/2 tablet on Tuesdays, Thursdays, and Saturdays. Continue taking 1 serving of greens per week. Recheck INR in 5 weeks. Call with any problems  [phone].

## 2020-07-05 ENCOUNTER — Other Ambulatory Visit: Payer: Self-pay | Admitting: Cardiology

## 2020-07-23 ENCOUNTER — Other Ambulatory Visit: Payer: Self-pay | Admitting: Cardiology

## 2020-08-01 ENCOUNTER — Other Ambulatory Visit: Payer: Self-pay

## 2020-08-01 ENCOUNTER — Encounter (INDEPENDENT_AMBULATORY_CARE_PROVIDER_SITE_OTHER): Payer: Self-pay

## 2020-08-01 ENCOUNTER — Ambulatory Visit (INDEPENDENT_AMBULATORY_CARE_PROVIDER_SITE_OTHER): Payer: Medicare HMO | Admitting: *Deleted

## 2020-08-01 DIAGNOSIS — I4819 Other persistent atrial fibrillation: Secondary | ICD-10-CM | POA: Diagnosis not present

## 2020-08-01 DIAGNOSIS — Z7901 Long term (current) use of anticoagulants: Secondary | ICD-10-CM | POA: Diagnosis not present

## 2020-08-01 DIAGNOSIS — Z5181 Encounter for therapeutic drug level monitoring: Secondary | ICD-10-CM

## 2020-08-01 LAB — POCT INR: INR: 2.9 (ref 2.0–3.0)

## 2020-08-01 NOTE — Patient Instructions (Signed)
Description   Continue taking 1 tablet daily except 1/2 tablet on Tuesdays, Thursdays, and Saturdays. Continue taking 1 serving of greens per week. Recheck INR in 6 weeks. Call with any problems  [phone].

## 2020-08-06 ENCOUNTER — Other Ambulatory Visit: Payer: Self-pay | Admitting: Cardiology

## 2020-08-24 ENCOUNTER — Other Ambulatory Visit: Payer: Self-pay | Admitting: Cardiology

## 2020-09-11 ENCOUNTER — Other Ambulatory Visit: Payer: Self-pay

## 2020-09-11 ENCOUNTER — Ambulatory Visit (INDEPENDENT_AMBULATORY_CARE_PROVIDER_SITE_OTHER): Payer: Medicare HMO | Admitting: *Deleted

## 2020-09-11 DIAGNOSIS — Z7901 Long term (current) use of anticoagulants: Secondary | ICD-10-CM

## 2020-09-11 DIAGNOSIS — Z5181 Encounter for therapeutic drug level monitoring: Secondary | ICD-10-CM | POA: Diagnosis not present

## 2020-09-11 DIAGNOSIS — I4819 Other persistent atrial fibrillation: Secondary | ICD-10-CM | POA: Diagnosis not present

## 2020-09-11 LAB — POCT INR: INR: 1.7 — AB (ref 2.0–3.0)

## 2020-09-11 NOTE — Patient Instructions (Signed)
Description   Today take another 1/2 tablet (total 1 tablet for the day) then continue taking 1 tablet daily except 1/2 tablet on Tuesdays, Thursdays, and Saturdays. Continue taking 1 serving of greens per week. Recheck INR in 2 weeks (normally 6 weeks). Call with any problems  [phone].

## 2020-09-16 ENCOUNTER — Other Ambulatory Visit: Payer: Self-pay | Admitting: Cardiology

## 2020-09-25 ENCOUNTER — Encounter: Payer: Self-pay | Admitting: Cardiology

## 2020-09-25 ENCOUNTER — Ambulatory Visit (INDEPENDENT_AMBULATORY_CARE_PROVIDER_SITE_OTHER): Payer: Medicare HMO | Admitting: Cardiology

## 2020-09-25 ENCOUNTER — Other Ambulatory Visit: Payer: Self-pay

## 2020-09-25 ENCOUNTER — Ambulatory Visit (INDEPENDENT_AMBULATORY_CARE_PROVIDER_SITE_OTHER): Payer: Medicare HMO

## 2020-09-25 VITALS — BP 128/64 | HR 88 | Ht 62.0 in | Wt 131.2 lb

## 2020-09-25 DIAGNOSIS — Z7901 Long term (current) use of anticoagulants: Secondary | ICD-10-CM | POA: Diagnosis not present

## 2020-09-25 DIAGNOSIS — E7800 Pure hypercholesterolemia, unspecified: Secondary | ICD-10-CM

## 2020-09-25 DIAGNOSIS — I5032 Chronic diastolic (congestive) heart failure: Secondary | ICD-10-CM

## 2020-09-25 DIAGNOSIS — I2510 Atherosclerotic heart disease of native coronary artery without angina pectoris: Secondary | ICD-10-CM

## 2020-09-25 DIAGNOSIS — I050 Rheumatic mitral stenosis: Secondary | ICD-10-CM | POA: Diagnosis not present

## 2020-09-25 DIAGNOSIS — I10 Essential (primary) hypertension: Secondary | ICD-10-CM

## 2020-09-25 DIAGNOSIS — Z5181 Encounter for therapeutic drug level monitoring: Secondary | ICD-10-CM | POA: Diagnosis not present

## 2020-09-25 DIAGNOSIS — I4819 Other persistent atrial fibrillation: Secondary | ICD-10-CM | POA: Diagnosis not present

## 2020-09-25 DIAGNOSIS — I351 Nonrheumatic aortic (valve) insufficiency: Secondary | ICD-10-CM

## 2020-09-25 LAB — CBC
Hematocrit: 34.1 % (ref 34.0–46.6)
Hemoglobin: 11.3 g/dL (ref 11.1–15.9)
MCH: 30.7 pg (ref 26.6–33.0)
MCHC: 33.1 g/dL (ref 31.5–35.7)
MCV: 93 fL (ref 79–97)
Platelets: 273 10*3/uL (ref 150–450)
RBC: 3.68 x10E6/uL — ABNORMAL LOW (ref 3.77–5.28)
RDW: 12.1 % (ref 11.7–15.4)
WBC: 7.6 10*3/uL (ref 3.4–10.8)

## 2020-09-25 LAB — LIPID PANEL
Chol/HDL Ratio: 2.7 ratio (ref 0.0–4.4)
Cholesterol, Total: 142 mg/dL (ref 100–199)
HDL: 53 mg/dL (ref >39)
LDL Chol Calc (NIH): 76 mg/dL (ref 0–99)
Triglycerides: 66 mg/dL (ref 0–149)
VLDL Cholesterol Cal: 13 mg/dL (ref 5–40)

## 2020-09-25 LAB — COMPREHENSIVE METABOLIC PANEL
ALT: 12 IU/L (ref 0–32)
AST: 15 IU/L (ref 0–40)
Albumin/Globulin Ratio: 1.1 — ABNORMAL LOW (ref 1.2–2.2)
Albumin: 3.9 g/dL (ref 3.6–4.6)
Alkaline Phosphatase: 77 IU/L (ref 44–121)
BUN/Creatinine Ratio: 19 (ref 12–28)
BUN: 24 mg/dL (ref 8–27)
Bilirubin Total: 0.4 mg/dL (ref 0.0–1.2)
CO2: 19 mmol/L — ABNORMAL LOW (ref 20–29)
Calcium: 9.2 mg/dL (ref 8.7–10.3)
Chloride: 106 mmol/L (ref 96–106)
Creatinine, Ser: 1.29 mg/dL — ABNORMAL HIGH (ref 0.57–1.00)
Globulin, Total: 3.5 g/dL (ref 1.5–4.5)
Glucose: 115 mg/dL — ABNORMAL HIGH (ref 65–99)
Potassium: 4.8 mmol/L (ref 3.5–5.2)
Sodium: 140 mmol/L (ref 134–144)
Total Protein: 7.4 g/dL (ref 6.0–8.5)
eGFR: 40 mL/min/{1.73_m2} — ABNORMAL LOW (ref >59)

## 2020-09-25 LAB — POCT INR: INR: 3.3 — AB (ref 2.0–3.0)

## 2020-09-25 NOTE — Addendum Note (Signed)
Addended by: [name] on: 09/25/2020 10:51 AM   Modules accepted: Orders

## 2020-09-25 NOTE — Addendum Note (Signed)
Addended by: Fransico Him R on: 09/25/2020 12:48 PM   Modules accepted: Orders

## 2020-09-25 NOTE — Patient Instructions (Signed)
Medication Instructions:  Your physician recommends that you continue on your current medications as directed. Please refer to the Current Medication list given to you today.  *If you need a refill on your cardiac medications before your next appointment, please call your pharmacy*   Lab Work: TODAY: CMET, CBC, FLP If you have labs (blood work) drawn today and your tests are completely normal, you will receive your results only by: Marland Kitchen MyChart Message (if you have MyChart) OR . A paper copy in the mail If you have any lab test that is abnormal or we need to change your treatment, we will call you to review the results.   Testing/Procedures: Your physician has requested that you have a lexiscan myoview. For further information please visit [email or web address]. Please follow instruction sheet, as given.  Your physician has requested that you have an echocardiogram. Echocardiography is a painless test that uses sound waves to create images of your heart. It provides your doctor with information about the size and shape of your heart and how well your heart's chambers and valves are working. This procedure takes approximately one hour. There are no restrictions for this procedure.   Follow-Up: At [facility], you and your health needs are our priority.  As part of our continuing mission to provide you with exceptional heart care, we have created designated Provider Care Teams.  These Care Teams include your primary Cardiologist (physician) and Advanced Practice Providers (APPs -  Physician Assistants and Nurse Practitioners) who all work together to provide you with the care you need, when you need it.  Your next appointment:   1 year(s)  The format for your next appointment:   In Person  Provider:   You may see Fransico Him, MD or one of the following Advanced Practice Providers on your designated Care Team:    Melina Copa, PA-C  Ermalinda Barrios, PA-C

## 2020-09-25 NOTE — Patient Instructions (Signed)
Description   Skip tomorrow's dosage of Warfarin, then resume same dosage 1 tablet daily except 1/2 tablet on Tuesdays, Thursdays, and Saturdays. Continue taking 1 serving of greens per week. Recheck INR in 3 weeks (normally 6 weeks). Call with any problems  [phone].

## 2020-09-25 NOTE — Progress Notes (Signed)
Date:  09/25/2020   ID:  Ana [name], DOB 1932-01-12, MRN [id number]  PCP:  London Pepper, MD  Cardiologist:  Fransico Him, MD Electrophysiologist:  None   Chief Complaint:  CAD, atrial flutter, HLD, HTN, CHF  History of Present Illness:    Ana [name] is a 87 y.o. female with a hx of permanent atrial flutter,CAD(multivessel CAD s/p PCI with BMS to the LAD and CFX and subsequent staged PCI with BMS to the RCA),chronicdiastolic CHF, pulmonary HTN, minimalmitral stenosis (echo 09/2012), HTN, HL and anemia of chronic disease.   She is here today for followup and is doing well.  She tells me that she has been having episodes of back pain that radiates into her neck that occurs when standing up too long.  Once she rests it goes away.  She says a few times she felt nauseated, SOB and diaphoretic with the pain.  She denies any chest pain or pressure,  DOE, PND, orthopnea, LE edema, dizziness, palpitations or syncope. She is compliant with her meds and is tolerating meds with no SE.    Prior CV studies:   The following studies were reviewed today:  2D echo 03/2019 IMPRESSIONS   1. Left ventricular ejection fraction, by visual estimation, is 55 to  60%. The left ventricle has normal function. There is no left ventricular  hypertrophy.  2. Global right ventricle has mildly reduced systolic function.The right  ventricular size is normal. No increase in right ventricular wall  thickness.  3. Left atrial size was normal.  4. Right atrial size was normal.  5. Moderate aortic valve annular calcification.  6. The mitral valve is normal in structure. Mild mitral valve  regurgitation.  7. The tricuspid valve is normal in structure. Tricuspid valve  regurgitation is trivial.  8. The aortic valve is abnormal. Aortic valve regurgitation is trivial.  Mild aortic valve sclerosis without stenosis.  9. The pulmonic valve was normal in structure. Pulmonic valve  regurgitation is not  visualized.  10. The atrial septum is grossly normal.   Past Medical History:  Diagnosis Date  . Anemia of chronic disease   . Aortic insufficiency    moderate by echo 03/2018  . Chronic diastolic CHF (congestive heart failure) ([place]) 2014  . Diabetes mellitus without complication ([place])   . Dizziness 02/13/2015  . Hyperlipidemia   . Hypertension   . Hypothyroidism   . MI, old 07/18/2013  . Mitral stenosis    mild by echo 03/2018  . Permanent atrial fibrillation ([place])   . Pulmonary HTN ([place])    PASP 13mmHg 03/2018  . Urinary incontinence   . Vitamin D deficiency disease    Past Surgical History:  Procedure Laterality Date  . CATARACT EXTRACTION    . LAPAROSCOPIC APPENDECTOMY N/A 04/14/2017   Procedure: APPENDECTOMY LAPAROSCOPIC;  Surgeon: Fanny Skates, MD;  Location: [facility] ORS;  Service: General;  Laterality: N/A;  . LEFT HEART CATHETERIZATION WITH CORONARY ANGIOGRAM N/A 01/17/2014   Procedure: LEFT HEART CATHETERIZATION WITH CORONARY ANGIOGRAM;  Surgeon: Troy Sine, MD;  Location: [facility] CATH LAB;  Service: Cardiovascular;  Laterality: N/A;  . PERCUTANEOUS CORONARY STENT INTERVENTION (PCI-S)  01/17/2014   Procedure: PERCUTANEOUS CORONARY STENT INTERVENTION (PCI-S);  Surgeon: Troy Sine, MD;  Location: [facility] CATH LAB;  Service: Cardiovascular;;  . PERCUTANEOUS CORONARY STENT INTERVENTION (PCI-S) N/A 01/19/2014   Procedure: PERCUTANEOUS CORONARY STENT INTERVENTION (PCI-S);  Surgeon: Leonie Man, MD;  Location: [facility] CATH LAB;  Service: Cardiovascular;  Laterality: N/A;  Current Meds  Medication Sig  . acetaminophen (TYLENOL) 500 MG tablet Take 500 mg by mouth every 8 (eight) hours as needed for moderate pain or headache.   Marland Kitchen amLODipine (NORVASC) 5 MG tablet Take 1 tablet (5 mg total) by mouth daily.  Marland Kitchen aspirin EC 81 MG tablet Take 81 mg by mouth daily.  . Cholecalciferol (VITAMIN D3) 2000 UNITS TABS Take 2,000 Units by mouth daily.   . Coenzyme Q10 200 MG capsule Take 200 mg  by mouth daily.  . Ferrous Sulfate (SLOW FE PO) Take 1 tablet by mouth daily.  Marland Kitchen levothyroxine (SYNTHROID, LEVOTHROID) 75 MCG tablet Take 75 mcg by mouth daily before breakfast.   . losartan (COZAAR) 100 MG tablet Take 1 tablet (100 mg total) by mouth daily.  . metFORMIN (GLUCOPHAGE) 1000 MG tablet Take 1 tablet (1,000 mg total) by mouth 2 (two) times daily with a meal.  . Omega-3 Krill Oil 500 MG CAPS Take 500 mg by mouth daily.   . phenylephrine-shark liver oil-mineral oil-petrolatum (PREPARATION H) 0.25-3-14-71.9 % rectal ointment Place 1 application rectally 2 (two) times daily as needed for hemorrhoids.  . polyethylene glycol (MIRALAX / GLYCOLAX) packet Take 17 g by mouth daily as needed for moderate constipation.   . pravastatin (PRAVACHOL) 20 MG tablet Take 1 tablet (20 mg total) by mouth every evening.  . vitamin B-12 (CYANOCOBALAMIN) 1000 MCG tablet Take 1,000 mcg by mouth daily.  Marland Kitchen warfarin (COUMADIN) 2.5 MG tablet TAKE 1/2 TO 1 (ONE-HALF TO ONE) TABLET BY MOUTH ONCE DAILY AS DIRECTED BY  COUMADIN  CLINIC     Allergies:   Welchol [colesevelam hcl], Zetia [ezetimibe], Statins, Crestor [rosuvastatin], and Zocor [simvastatin]   Social History   Tobacco Use  . Smoking status: Never Smoker  . Smokeless tobacco: Never Used  Vaping Use  . Vaping Use: Never used  Substance Use Topics  . Alcohol use: No  . Drug use: No     Family Hx: The patient's family history includes Cancer in her mother; Cancer (age of onset: 51) in her sister and another family member; Cirrhosis in her father; Diabetes in her brother; Heart attack in her sister; Heart disease in her sister.  ROS:   Please see the history of present illness.     All other systems reviewed and are negative.   Labs/Other Tests and Data Reviewed:    Recent Labs: No results found for requested labs within last 8760 hours.   Recent Lipid Panel Lab Results  Component Value Date/Time   CHOL 124 04/21/2019 09:26 AM   TRIG  71 04/21/2019 09:26 AM   HDL 47 04/21/2019 09:26 AM   CHOLHDL 2.6 04/21/2019 09:26 AM   CHOLHDL 3.2 06/02/2016 11:01 AM   LDLCALC 62 04/21/2019 09:26 AM    Wt Readings from Last 3 Encounters:  09/25/20 131 lb 3.2 oz (59.5 kg)  03/30/19 130 lb (59 kg)  03/18/18 134 lb 12.8 oz (61.1 kg)     Objective:    Vital Signs:  BP 128/64   Pulse 88   Ht 5\' 2"  (1.575 m)   Wt 131 lb 3.2 oz (59.5 kg)   SpO2 98%   BMI 24.00 kg/m    GEN: Well nourished, well developed in no acute distress HEENT: Normal NECK: No JVD; No carotid bruits LYMPHATICS: No lymphadenopathy CARDIAC:RRR, no murmurs, rubs, gallops RESPIRATORY:  Clear to auscultation without rales, wheezing or rhonchi  ABDOMEN: Soft, non-tender, non-distended MUSCULOSKELETAL:  No edema; No deformity  SKIN: Warm  and dry NEUROLOGIC:  Alert and oriented x 3 PSYCHIATRIC:  Normal affect   ASSESSMENT & PLAN:    1.  Chronic diastolic CHF -she appears euvolemic on exam today -she has not required any diuretics  2.  ASCAD -multivessel CAD s/p PCI with BMS to the LAD and CFX and subsequent staged PCI with BMS to the RCA. -she has had some symptoms recently that are concerning for possible anginal equivalent.  She describes episodes of sudden onset of back pain in her waist that radiates up her back into her neck associated with SOB and nausea and diaphoresis and then feels tight in her shoulder blades.  This is mainly exertional and resolves with rest -I have recommended proceeding with Leane Call to rule out ischemia -Shared Decision Making/Informed Consent The risks [chest pain, shortness of breath, cardiac arrhythmias, dizziness, blood pressure fluctuations, myocardial infarction, stroke/transient ischemic attack, nausea, vomiting, allergic reaction, radiation exposure, metallic taste sensation and life-threatening complications (estimated to be 1 in 10,000)], benefits (risk stratification, diagnosing coronary artery disease,  treatment guidance) and alternatives of a nuclear stress test were discussed in detail with Ms. Tredway and she agrees to proceed. -continue ASA and statin  3.  HTN -BP is well controlled on exam today -continue Losartan 100mg  daily and amlodipine 5mg  daily.  -check BMET  4.  Permanent atrial fibrillation -HR is well controlled and she denies any palpitations -she has not required any CCB or BB -continue warfarin - followed in our Coumadin clinic -check Hbg  5.  Hyperlipidemia -LDL goal < 70 -check FLP and ALT -continue Pravastatin 20mg  daily  6.  Mitral stenosis -not noted on echo 2020  7.  Moderate AR -trivial AR by echo 03/2019 but had been moderate in the past -I cannot pull up echo images to review -repeat echo   Medication Adjustments/Labs and Tests Ordered: Current medicines are reviewed at length with the patient today.  Concerns regarding medicines are outlined above.  Tests Ordered: Orders Placed This Encounter  Procedures  . EKG 12-Lead   Medication Changes: No orders of the defined types were placed in this encounter.   Disposition:  Follow up in 1 year(s)  Signed, Fransico Him, MD  09/25/2020 10:43 AM    [place]

## 2020-09-26 ENCOUNTER — Telehealth: Payer: Self-pay

## 2020-09-26 DIAGNOSIS — E7800 Pure hypercholesterolemia, unspecified: Secondary | ICD-10-CM

## 2020-09-26 MED ORDER — PRAVASTATIN SODIUM 40 MG PO TABS: 40.0000 mg | ORAL_TABLET | Freq: Every evening | ORAL | 3 refills | Status: DC

## 2020-09-26 NOTE — Telephone Encounter (Signed)
-----   Message from Sueanne Margarita, MD sent at 09/25/2020  5:55 PM EDT ----- LDL not at goal - increase Pravastatin to 40mg  daily and repeat FLP and ALT In 6 weeks

## 2020-09-26 NOTE — Telephone Encounter (Signed)
The patient has been notified of the result and verbalized understanding.  All questions (if any) were answered. [name], RN 09/26/2020 11:00 AM  Patient will increase pravastatin to 40 mg daily and repeat lab work when she comes in for her [place] and echocardiogram in May

## 2020-10-15 ENCOUNTER — Other Ambulatory Visit: Payer: Self-pay | Admitting: Cardiology

## 2020-10-16 ENCOUNTER — Ambulatory Visit (INDEPENDENT_AMBULATORY_CARE_PROVIDER_SITE_OTHER): Payer: Medicare HMO | Admitting: *Deleted

## 2020-10-16 ENCOUNTER — Other Ambulatory Visit: Payer: Self-pay

## 2020-10-16 DIAGNOSIS — Z5181 Encounter for therapeutic drug level monitoring: Secondary | ICD-10-CM | POA: Diagnosis not present

## 2020-10-16 DIAGNOSIS — I4819 Other persistent atrial fibrillation: Secondary | ICD-10-CM | POA: Diagnosis not present

## 2020-10-16 DIAGNOSIS — Z7901 Long term (current) use of anticoagulants: Secondary | ICD-10-CM

## 2020-10-16 LAB — POCT INR: INR: 3.3 — AB (ref 2.0–3.0)

## 2020-10-16 NOTE — Patient Instructions (Signed)
Description   Skip tomorrow's dosage of Warfarin, then start taking 1/2 tablet daily except 1 tablet on Mondays, Wednesdays, and Fridays. Continue taking 1 serving of greens per week. Recheck INR in 3 weeks (normally 6 weeks). Call with any problems  [phone].

## 2020-10-29 ENCOUNTER — Other Ambulatory Visit: Payer: Self-pay | Admitting: Cardiology

## 2020-10-31 ENCOUNTER — Other Ambulatory Visit: Payer: Medicare HMO

## 2020-10-31 ENCOUNTER — Other Ambulatory Visit (HOSPITAL_COMMUNITY): Payer: Medicare HMO

## 2020-10-31 ENCOUNTER — Encounter (HOSPITAL_COMMUNITY): Payer: Medicare HMO

## 2020-11-06 ENCOUNTER — Telehealth (HOSPITAL_COMMUNITY): Payer: Self-pay

## 2020-11-06 NOTE — Telephone Encounter (Signed)
Spoke with the patient, detailed instructions given. Ana [name] stated that Ana [name] understood and would be here for here test. Asked to call back with any questtions. [name] EMTP

## 2020-11-08 ENCOUNTER — Ambulatory Visit (HOSPITAL_BASED_OUTPATIENT_CLINIC_OR_DEPARTMENT_OTHER): Payer: Medicare HMO

## 2020-11-08 ENCOUNTER — Ambulatory Visit (HOSPITAL_COMMUNITY): Payer: Medicare HMO | Attending: Cardiology

## 2020-11-08 ENCOUNTER — Ambulatory Visit (INDEPENDENT_AMBULATORY_CARE_PROVIDER_SITE_OTHER): Payer: Medicare HMO | Admitting: *Deleted

## 2020-11-08 ENCOUNTER — Other Ambulatory Visit: Payer: Medicare HMO

## 2020-11-08 ENCOUNTER — Other Ambulatory Visit: Payer: Self-pay

## 2020-11-08 DIAGNOSIS — E7800 Pure hypercholesterolemia, unspecified: Secondary | ICD-10-CM

## 2020-11-08 DIAGNOSIS — Z5181 Encounter for therapeutic drug level monitoring: Secondary | ICD-10-CM | POA: Diagnosis not present

## 2020-11-08 DIAGNOSIS — I050 Rheumatic mitral stenosis: Secondary | ICD-10-CM | POA: Diagnosis not present

## 2020-11-08 DIAGNOSIS — I5032 Chronic diastolic (congestive) heart failure: Secondary | ICD-10-CM

## 2020-11-08 DIAGNOSIS — I4819 Other persistent atrial fibrillation: Secondary | ICD-10-CM | POA: Diagnosis not present

## 2020-11-08 DIAGNOSIS — I351 Nonrheumatic aortic (valve) insufficiency: Secondary | ICD-10-CM | POA: Insufficient documentation

## 2020-11-08 DIAGNOSIS — Z7901 Long term (current) use of anticoagulants: Secondary | ICD-10-CM | POA: Diagnosis not present

## 2020-11-08 LAB — MYOCARDIAL PERFUSION IMAGING
LV dias vol: 52 mL (ref 46–106)
LV sys vol: 31 mL
Peak HR: 96 {beats}/min
Rest HR: 88 {beats}/min
SDS: 1
SRS: 4
SSS: 5
TID: 0.91

## 2020-11-08 LAB — POCT INR: INR: 2.7 (ref 2.0–3.0)

## 2020-11-08 LAB — LIPID PANEL
Chol/HDL Ratio: 2.4 ratio (ref 0.0–4.4)
Cholesterol, Total: 110 mg/dL (ref 100–199)
HDL: 45 mg/dL (ref >39)
LDL Chol Calc (NIH): 51 mg/dL (ref 0–99)
Triglycerides: 64 mg/dL (ref 0–149)
VLDL Cholesterol Cal: 14 mg/dL (ref 5–40)

## 2020-11-08 LAB — ECHOCARDIOGRAM COMPLETE
Height: 62 in
S' Lateral: 2.8 cm
Weight: 2096 oz

## 2020-11-08 LAB — ALT: ALT: 6 IU/L (ref 0–32)

## 2020-11-08 NOTE — Patient Instructions (Addendum)
Description   Continue taking 1/2 tablet daily except 1 tablet on Mondays, Wednesdays, and Fridays. Continue taking 1 serving of greens per week. Recheck INR in 4 weeks (normally 6 weeks). Call with any problems  [phone].

## 2020-11-13 DIAGNOSIS — M549 Dorsalgia, unspecified: Secondary | ICD-10-CM | POA: Diagnosis not present

## 2020-11-13 DIAGNOSIS — I2510 Atherosclerotic heart disease of native coronary artery without angina pectoris: Secondary | ICD-10-CM | POA: Diagnosis not present

## 2020-11-13 DIAGNOSIS — I4891 Unspecified atrial fibrillation: Secondary | ICD-10-CM | POA: Diagnosis not present

## 2020-11-13 DIAGNOSIS — E1169 Type 2 diabetes mellitus with other specified complication: Secondary | ICD-10-CM | POA: Diagnosis not present

## 2020-11-13 DIAGNOSIS — E039 Hypothyroidism, unspecified: Secondary | ICD-10-CM | POA: Diagnosis not present

## 2020-12-06 ENCOUNTER — Ambulatory Visit (INDEPENDENT_AMBULATORY_CARE_PROVIDER_SITE_OTHER): Payer: Medicare HMO | Admitting: Pharmacist

## 2020-12-06 ENCOUNTER — Other Ambulatory Visit: Payer: Self-pay

## 2020-12-06 DIAGNOSIS — Z5181 Encounter for therapeutic drug level monitoring: Secondary | ICD-10-CM

## 2020-12-06 DIAGNOSIS — I4819 Other persistent atrial fibrillation: Secondary | ICD-10-CM | POA: Diagnosis not present

## 2020-12-06 DIAGNOSIS — Z7901 Long term (current) use of anticoagulants: Secondary | ICD-10-CM | POA: Diagnosis not present

## 2020-12-06 LAB — POCT INR: INR: 4.7 — AB (ref 2.0–3.0)

## 2020-12-06 NOTE — Patient Instructions (Signed)
Hold warfarin tomorrow and Saturday (already took today) then start taking 1/2 tablet daily except 1 tablet on Mondays and Fridays. Continue taking 1 serving of greens per week. Recheck INR in 10 days.  Call with any problems  [phone].

## 2020-12-06 NOTE — Progress Notes (Signed)
Patient reports that her diarrhea was black, however she reports that its almost always black. She does take iron supplements. Will check a CBC just to be sure its stable and no bleeding anywhere.

## 2020-12-07 ENCOUNTER — Telehealth: Payer: Self-pay | Admitting: Pharmacist

## 2020-12-07 LAB — CBC
Hematocrit: 32.3 % — ABNORMAL LOW (ref 34.0–46.6)
Hemoglobin: 10.4 g/dL — ABNORMAL LOW (ref 11.1–15.9)
MCH: 30 pg (ref 26.6–33.0)
MCHC: 32.2 g/dL (ref 31.5–35.7)
MCV: 93 fL (ref 79–97)
Platelets: 290 10*3/uL (ref 150–450)
RBC: 3.47 x10E6/uL — ABNORMAL LOW (ref 3.77–5.28)
RDW: 12.1 % (ref 11.7–15.4)
WBC: 9 10*3/uL (ref 3.4–10.8)

## 2020-12-07 NOTE — Telephone Encounter (Signed)
Attempted to call pt to let her know her CBC is stable. No answer and no VM set up Will try again later

## 2020-12-07 NOTE — Telephone Encounter (Signed)
Patient advised that labs were stable. Dark stools most likely from iron.

## 2020-12-14 ENCOUNTER — Other Ambulatory Visit: Payer: Self-pay | Admitting: Cardiology

## 2020-12-14 DIAGNOSIS — I4819 Other persistent atrial fibrillation: Secondary | ICD-10-CM

## 2020-12-14 DIAGNOSIS — Z7901 Long term (current) use of anticoagulants: Secondary | ICD-10-CM

## 2020-12-18 ENCOUNTER — Ambulatory Visit (INDEPENDENT_AMBULATORY_CARE_PROVIDER_SITE_OTHER): Payer: Medicare HMO | Admitting: *Deleted

## 2020-12-18 ENCOUNTER — Other Ambulatory Visit: Payer: Self-pay

## 2020-12-18 DIAGNOSIS — Z7901 Long term (current) use of anticoagulants: Secondary | ICD-10-CM | POA: Diagnosis not present

## 2020-12-18 DIAGNOSIS — Z5181 Encounter for therapeutic drug level monitoring: Secondary | ICD-10-CM

## 2020-12-18 DIAGNOSIS — I4819 Other persistent atrial fibrillation: Secondary | ICD-10-CM | POA: Diagnosis not present

## 2020-12-18 LAB — POCT INR: INR: 3.7 — AB (ref 2.0–3.0)

## 2020-12-18 NOTE — Patient Instructions (Addendum)
Description   Hold warfarin tomorrow, then START TAKING warfarin 1/2 a tablet daily except for 1 tablet on Fridays. Recheck INR in 2 weeks. [facility] [phone]

## 2021-01-03 ENCOUNTER — Other Ambulatory Visit: Payer: Self-pay

## 2021-01-03 ENCOUNTER — Ambulatory Visit (INDEPENDENT_AMBULATORY_CARE_PROVIDER_SITE_OTHER): Payer: Medicare HMO | Admitting: Pharmacist

## 2021-01-03 DIAGNOSIS — I4819 Other persistent atrial fibrillation: Secondary | ICD-10-CM | POA: Diagnosis not present

## 2021-01-03 DIAGNOSIS — Z7901 Long term (current) use of anticoagulants: Secondary | ICD-10-CM | POA: Diagnosis not present

## 2021-01-03 DIAGNOSIS — Z5181 Encounter for therapeutic drug level monitoring: Secondary | ICD-10-CM | POA: Diagnosis not present

## 2021-01-03 LAB — POCT INR: INR: 4.5 — AB (ref 2.0–3.0)

## 2021-01-03 NOTE — Patient Instructions (Signed)
Description   Hold warfarin tomorrow and tomorrow, then START TAKING warfarin 1/2 tablet daily. Recheck INR in 2 weeks. [facility] [phone]

## 2021-01-08 ENCOUNTER — Other Ambulatory Visit: Payer: Self-pay

## 2021-01-08 ENCOUNTER — Ambulatory Visit
Admission: RE | Admit: 2021-01-08 | Discharge: 2021-01-08 | Disposition: A | Discharge status: Home / Self Care | Payer: Medicare HMO | Source: Ambulatory Visit | Attending: Family Medicine | Admitting: Family Medicine

## 2021-01-08 ENCOUNTER — Other Ambulatory Visit: Payer: Self-pay | Admitting: Family Medicine

## 2021-01-08 DIAGNOSIS — D649 Anemia, unspecified: Secondary | ICD-10-CM | POA: Diagnosis not present

## 2021-01-08 DIAGNOSIS — I2510 Atherosclerotic heart disease of native coronary artery without angina pectoris: Secondary | ICD-10-CM | POA: Diagnosis not present

## 2021-01-08 DIAGNOSIS — R202 Paresthesia of skin: Secondary | ICD-10-CM | POA: Diagnosis not present

## 2021-01-08 DIAGNOSIS — E039 Hypothyroidism, unspecified: Secondary | ICD-10-CM | POA: Diagnosis not present

## 2021-01-08 DIAGNOSIS — I4891 Unspecified atrial fibrillation: Secondary | ICD-10-CM | POA: Diagnosis not present

## 2021-01-08 DIAGNOSIS — R0602 Shortness of breath: Secondary | ICD-10-CM | POA: Diagnosis not present

## 2021-01-08 DIAGNOSIS — I5032 Chronic diastolic (congestive) heart failure: Secondary | ICD-10-CM | POA: Diagnosis not present

## 2021-01-16 ENCOUNTER — Ambulatory Visit (INDEPENDENT_AMBULATORY_CARE_PROVIDER_SITE_OTHER): Payer: Medicare HMO

## 2021-01-16 ENCOUNTER — Other Ambulatory Visit: Payer: Self-pay

## 2021-01-16 DIAGNOSIS — Z5181 Encounter for therapeutic drug level monitoring: Secondary | ICD-10-CM | POA: Diagnosis not present

## 2021-01-16 DIAGNOSIS — Z7901 Long term (current) use of anticoagulants: Secondary | ICD-10-CM

## 2021-01-16 DIAGNOSIS — I4819 Other persistent atrial fibrillation: Secondary | ICD-10-CM | POA: Diagnosis not present

## 2021-01-16 LAB — POCT INR: INR: 2.2 (ref 2.0–3.0)

## 2021-01-16 NOTE — Patient Instructions (Addendum)
Description   Continue warfarin 1/2 tablet daily. Recheck INR in 3 weeks. [facility] [phone]

## 2021-02-11 ENCOUNTER — Encounter (INDEPENDENT_AMBULATORY_CARE_PROVIDER_SITE_OTHER): Payer: Self-pay

## 2021-02-11 ENCOUNTER — Other Ambulatory Visit: Payer: Self-pay

## 2021-02-11 ENCOUNTER — Ambulatory Visit (INDEPENDENT_AMBULATORY_CARE_PROVIDER_SITE_OTHER): Payer: Medicare HMO | Admitting: *Deleted

## 2021-02-11 DIAGNOSIS — Z7901 Long term (current) use of anticoagulants: Secondary | ICD-10-CM

## 2021-02-11 DIAGNOSIS — Z5181 Encounter for therapeutic drug level monitoring: Secondary | ICD-10-CM

## 2021-02-11 DIAGNOSIS — E039 Hypothyroidism, unspecified: Secondary | ICD-10-CM | POA: Diagnosis not present

## 2021-02-11 DIAGNOSIS — I4819 Other persistent atrial fibrillation: Secondary | ICD-10-CM | POA: Diagnosis not present

## 2021-02-11 LAB — POCT INR: INR: 2.5 (ref 2.0–3.0)

## 2021-02-11 NOTE — Patient Instructions (Signed)
Description   Continue warfarin 1/2 tablet daily. Recheck INR in 4 weeks. [facility] [phone]

## 2021-03-12 ENCOUNTER — Ambulatory Visit (INDEPENDENT_AMBULATORY_CARE_PROVIDER_SITE_OTHER): Payer: Medicare HMO

## 2021-03-12 ENCOUNTER — Other Ambulatory Visit: Payer: Self-pay

## 2021-03-12 DIAGNOSIS — I4819 Other persistent atrial fibrillation: Secondary | ICD-10-CM

## 2021-03-12 DIAGNOSIS — Z5181 Encounter for therapeutic drug level monitoring: Secondary | ICD-10-CM

## 2021-03-12 DIAGNOSIS — Z7901 Long term (current) use of anticoagulants: Secondary | ICD-10-CM

## 2021-03-12 LAB — POCT INR: INR: 2.8 (ref 2.0–3.0)

## 2021-03-12 NOTE — Patient Instructions (Signed)
Description   Continue warfarin 1/2 tablet daily. Recheck INR in 5 weeks. [facility] [phone]

## 2021-03-25 DIAGNOSIS — E039 Hypothyroidism, unspecified: Secondary | ICD-10-CM | POA: Diagnosis not present

## 2021-04-16 ENCOUNTER — Other Ambulatory Visit: Payer: Self-pay

## 2021-04-16 ENCOUNTER — Ambulatory Visit (INDEPENDENT_AMBULATORY_CARE_PROVIDER_SITE_OTHER): Payer: Medicare HMO | Admitting: *Deleted

## 2021-04-16 DIAGNOSIS — Z5181 Encounter for therapeutic drug level monitoring: Secondary | ICD-10-CM | POA: Diagnosis not present

## 2021-04-16 DIAGNOSIS — I4819 Other persistent atrial fibrillation: Secondary | ICD-10-CM | POA: Diagnosis not present

## 2021-04-16 DIAGNOSIS — Z7901 Long term (current) use of anticoagulants: Secondary | ICD-10-CM | POA: Diagnosis not present

## 2021-04-16 LAB — POCT INR: INR: 1.7 — AB (ref 2.0–3.0)

## 2021-04-16 NOTE — Patient Instructions (Signed)
Description   Today take another 1/2 tablet then continue warfarin 1/2 tablet daily.Continue drinking Ensure weekly. Recheck INR in 3 weeks. [facility] [phone]

## 2021-05-08 ENCOUNTER — Ambulatory Visit (INDEPENDENT_AMBULATORY_CARE_PROVIDER_SITE_OTHER): Payer: Medicare HMO | Admitting: *Deleted

## 2021-05-08 ENCOUNTER — Other Ambulatory Visit: Payer: Self-pay

## 2021-05-08 DIAGNOSIS — Z7901 Long term (current) use of anticoagulants: Secondary | ICD-10-CM | POA: Diagnosis not present

## 2021-05-08 DIAGNOSIS — I4819 Other persistent atrial fibrillation: Secondary | ICD-10-CM

## 2021-05-08 DIAGNOSIS — Z5181 Encounter for therapeutic drug level monitoring: Secondary | ICD-10-CM | POA: Diagnosis not present

## 2021-05-08 LAB — POCT INR: INR: 2.1 (ref 2.0–3.0)

## 2021-05-08 NOTE — Patient Instructions (Signed)
Description   Continue warfarin 1/2 tablet daily.Continue drinking Ensure weekly. Recheck INR in 4 weeks. [facility] [phone]

## 2021-06-05 ENCOUNTER — Ambulatory Visit (INDEPENDENT_AMBULATORY_CARE_PROVIDER_SITE_OTHER): Payer: Medicare HMO | Admitting: *Deleted

## 2021-06-05 ENCOUNTER — Other Ambulatory Visit: Payer: Self-pay

## 2021-06-05 DIAGNOSIS — Z7901 Long term (current) use of anticoagulants: Secondary | ICD-10-CM | POA: Diagnosis not present

## 2021-06-05 DIAGNOSIS — I4819 Other persistent atrial fibrillation: Secondary | ICD-10-CM

## 2021-06-05 DIAGNOSIS — Z5181 Encounter for therapeutic drug level monitoring: Secondary | ICD-10-CM

## 2021-06-05 LAB — POCT INR: INR: 1.5 — AB (ref 2.0–3.0)

## 2021-06-05 NOTE — Patient Instructions (Addendum)
Description   Today and tomorrow take 1 tablet then continue warfarin 1/2 tablet daily. Continue drinking Ensure weekly. Recheck INR in 1 week. [facility] [phone]

## 2021-06-07 DIAGNOSIS — E1169 Type 2 diabetes mellitus with other specified complication: Secondary | ICD-10-CM | POA: Diagnosis not present

## 2021-06-07 DIAGNOSIS — I10 Essential (primary) hypertension: Secondary | ICD-10-CM | POA: Diagnosis not present

## 2021-06-07 DIAGNOSIS — E039 Hypothyroidism, unspecified: Secondary | ICD-10-CM | POA: Diagnosis not present

## 2021-06-07 DIAGNOSIS — E785 Hyperlipidemia, unspecified: Secondary | ICD-10-CM | POA: Diagnosis not present

## 2021-06-11 ENCOUNTER — Other Ambulatory Visit: Payer: Self-pay

## 2021-06-11 ENCOUNTER — Ambulatory Visit (INDEPENDENT_AMBULATORY_CARE_PROVIDER_SITE_OTHER): Payer: Medicare HMO

## 2021-06-11 DIAGNOSIS — Z5181 Encounter for therapeutic drug level monitoring: Secondary | ICD-10-CM

## 2021-06-11 DIAGNOSIS — Z7901 Long term (current) use of anticoagulants: Secondary | ICD-10-CM | POA: Diagnosis not present

## 2021-06-11 DIAGNOSIS — I4819 Other persistent atrial fibrillation: Secondary | ICD-10-CM

## 2021-06-11 LAB — POCT INR: INR: 2.4 (ref 2.0–3.0)

## 2021-06-11 NOTE — Patient Instructions (Signed)
Description   Continue warfarin 1/2 tablet daily. Continue drinking Ensure weekly. Recheck INR in 2 weeks. [facility] [phone]

## 2021-06-26 ENCOUNTER — Ambulatory Visit (INDEPENDENT_AMBULATORY_CARE_PROVIDER_SITE_OTHER): Payer: Medicare HMO

## 2021-06-26 ENCOUNTER — Other Ambulatory Visit: Payer: Self-pay

## 2021-06-26 DIAGNOSIS — Z7901 Long term (current) use of anticoagulants: Secondary | ICD-10-CM

## 2021-06-26 DIAGNOSIS — I4819 Other persistent atrial fibrillation: Secondary | ICD-10-CM

## 2021-06-26 DIAGNOSIS — Z5181 Encounter for therapeutic drug level monitoring: Secondary | ICD-10-CM | POA: Diagnosis not present

## 2021-06-26 LAB — POCT INR: INR: 1.7 — AB (ref 2.0–3.0)

## 2021-06-26 NOTE — Patient Instructions (Signed)
Description   Start taking Warfarin 1/2 tablet daily except 1 tablet on Wednesdays.  Continue drinking Ensure weekly. Recheck INR in 2 weeks. [facility] [phone]

## 2021-07-10 ENCOUNTER — Other Ambulatory Visit: Payer: Self-pay

## 2021-07-10 ENCOUNTER — Ambulatory Visit (INDEPENDENT_AMBULATORY_CARE_PROVIDER_SITE_OTHER): Payer: Medicare HMO | Admitting: *Deleted

## 2021-07-10 DIAGNOSIS — Z5181 Encounter for therapeutic drug level monitoring: Secondary | ICD-10-CM | POA: Diagnosis not present

## 2021-07-10 DIAGNOSIS — Z7901 Long term (current) use of anticoagulants: Secondary | ICD-10-CM | POA: Diagnosis not present

## 2021-07-10 LAB — POCT INR: INR: 1.9 — AB (ref 2.0–3.0)

## 2021-07-10 NOTE — Patient Instructions (Signed)
Description   START taking warfarin 1/2 a tablet daily except for 1 tablet on Sundays and Wednesdays. Recheck INR in 2 weeks. [facility] [phone]

## 2021-07-11 DIAGNOSIS — E1169 Type 2 diabetes mellitus with other specified complication: Secondary | ICD-10-CM | POA: Diagnosis not present

## 2021-07-11 DIAGNOSIS — E785 Hyperlipidemia, unspecified: Secondary | ICD-10-CM | POA: Diagnosis not present

## 2021-07-11 DIAGNOSIS — I10 Essential (primary) hypertension: Secondary | ICD-10-CM | POA: Diagnosis not present

## 2021-07-11 DIAGNOSIS — E039 Hypothyroidism, unspecified: Secondary | ICD-10-CM | POA: Diagnosis not present

## 2021-07-25 ENCOUNTER — Ambulatory Visit (INDEPENDENT_AMBULATORY_CARE_PROVIDER_SITE_OTHER): Payer: Medicare HMO

## 2021-07-25 ENCOUNTER — Other Ambulatory Visit: Payer: Self-pay

## 2021-07-25 DIAGNOSIS — Z5181 Encounter for therapeutic drug level monitoring: Secondary | ICD-10-CM

## 2021-07-25 DIAGNOSIS — I4819 Other persistent atrial fibrillation: Secondary | ICD-10-CM | POA: Diagnosis not present

## 2021-07-25 DIAGNOSIS — Z7901 Long term (current) use of anticoagulants: Secondary | ICD-10-CM | POA: Diagnosis not present

## 2021-07-25 LAB — POCT INR: INR: 2.5 (ref 2.0–3.0)

## 2021-07-25 NOTE — Patient Instructions (Signed)
Description   Continue on same dosage of Warfarin 1/2 a tablet daily except for 1 tablet on Sundays and Wednesdays. Recheck INR in 3 weeks. [facility] [phone]

## 2021-08-15 ENCOUNTER — Ambulatory Visit (INDEPENDENT_AMBULATORY_CARE_PROVIDER_SITE_OTHER): Payer: Medicare HMO

## 2021-08-15 ENCOUNTER — Other Ambulatory Visit: Payer: Self-pay

## 2021-08-15 DIAGNOSIS — I4819 Other persistent atrial fibrillation: Secondary | ICD-10-CM

## 2021-08-15 DIAGNOSIS — Z5181 Encounter for therapeutic drug level monitoring: Secondary | ICD-10-CM | POA: Diagnosis not present

## 2021-08-15 DIAGNOSIS — Z7901 Long term (current) use of anticoagulants: Secondary | ICD-10-CM

## 2021-08-15 LAB — POCT INR: INR: 2.1 (ref 2.0–3.0)

## 2021-08-15 NOTE — Patient Instructions (Signed)
Description   Continue on same dosage of Warfarin 1/2 a tablet daily except for 1 tablet on Sundays and Wednesdays. Recheck INR in 4 weeks. [facility] [phone]

## 2021-08-21 DIAGNOSIS — D6869 Other thrombophilia: Secondary | ICD-10-CM | POA: Diagnosis not present

## 2021-08-21 DIAGNOSIS — E039 Hypothyroidism, unspecified: Secondary | ICD-10-CM | POA: Diagnosis not present

## 2021-08-21 DIAGNOSIS — R32 Unspecified urinary incontinence: Secondary | ICD-10-CM | POA: Diagnosis not present

## 2021-08-21 DIAGNOSIS — E785 Hyperlipidemia, unspecified: Secondary | ICD-10-CM | POA: Diagnosis not present

## 2021-08-21 DIAGNOSIS — E538 Deficiency of other specified B group vitamins: Secondary | ICD-10-CM | POA: Diagnosis not present

## 2021-08-21 DIAGNOSIS — E1169 Type 2 diabetes mellitus with other specified complication: Secondary | ICD-10-CM | POA: Diagnosis not present

## 2021-08-21 DIAGNOSIS — Z0000 Encounter for general adult medical examination without abnormal findings: Secondary | ICD-10-CM | POA: Diagnosis not present

## 2021-08-21 DIAGNOSIS — Z7984 Long term (current) use of oral hypoglycemic drugs: Secondary | ICD-10-CM | POA: Diagnosis not present

## 2021-08-21 DIAGNOSIS — D649 Anemia, unspecified: Secondary | ICD-10-CM | POA: Diagnosis not present

## 2021-08-21 DIAGNOSIS — Z23 Encounter for immunization: Secondary | ICD-10-CM | POA: Diagnosis not present

## 2021-08-21 DIAGNOSIS — M799 Soft tissue disorder, unspecified: Secondary | ICD-10-CM | POA: Diagnosis not present

## 2021-09-05 ENCOUNTER — Other Ambulatory Visit: Payer: Self-pay | Admitting: Cardiology

## 2021-09-05 NOTE — Telephone Encounter (Signed)
Prescription refill request received for warfarin ?Lov: 09/25/20 [place] [facility]) ?Next INR check: 09/12/21 ?Warfarin tablet strength: 2.'5mg'$  ? ?Appropriate dose and refill sent to requested pharmacy.  ?

## 2021-09-10 ENCOUNTER — Other Ambulatory Visit: Payer: Self-pay

## 2021-09-10 ENCOUNTER — Ambulatory Visit (INDEPENDENT_AMBULATORY_CARE_PROVIDER_SITE_OTHER): Payer: Medicare HMO

## 2021-09-10 DIAGNOSIS — Z7901 Long term (current) use of anticoagulants: Secondary | ICD-10-CM | POA: Diagnosis not present

## 2021-09-10 DIAGNOSIS — Z5181 Encounter for therapeutic drug level monitoring: Secondary | ICD-10-CM

## 2021-09-10 DIAGNOSIS — I4819 Other persistent atrial fibrillation: Secondary | ICD-10-CM

## 2021-09-10 LAB — POCT INR: INR: 1.3 — AB (ref 2.0–3.0)

## 2021-09-10 NOTE — Patient Instructions (Signed)
Description   ?Take an extra 1/2 tablet today, then take 1.5 tablets tomorrow, then resume same dosage of Warfarin 1/2 a tablet daily except for 1 tablet on Sundays and Wednesdays. Recheck INR in 10 days. [facility] [phone] ? ?  ?  ?

## 2021-09-13 ENCOUNTER — Other Ambulatory Visit: Payer: Self-pay | Admitting: Family Medicine

## 2021-09-13 DIAGNOSIS — M7989 Other specified soft tissue disorders: Secondary | ICD-10-CM

## 2021-09-16 DIAGNOSIS — E1169 Type 2 diabetes mellitus with other specified complication: Secondary | ICD-10-CM | POA: Diagnosis not present

## 2021-09-16 DIAGNOSIS — E039 Hypothyroidism, unspecified: Secondary | ICD-10-CM | POA: Diagnosis not present

## 2021-09-16 DIAGNOSIS — I10 Essential (primary) hypertension: Secondary | ICD-10-CM | POA: Diagnosis not present

## 2021-09-19 ENCOUNTER — Ambulatory Visit
Admission: RE | Admit: 2021-09-19 | Discharge: 2021-09-19 | Disposition: A | Discharge status: Home / Self Care | Payer: Medicare HMO | Source: Ambulatory Visit | Attending: Family Medicine | Admitting: Family Medicine

## 2021-09-19 ENCOUNTER — Ambulatory Visit (INDEPENDENT_AMBULATORY_CARE_PROVIDER_SITE_OTHER): Payer: Medicare HMO

## 2021-09-19 DIAGNOSIS — R1900 Intra-abdominal and pelvic swelling, mass and lump, unspecified site: Secondary | ICD-10-CM | POA: Diagnosis not present

## 2021-09-19 DIAGNOSIS — I4819 Other persistent atrial fibrillation: Secondary | ICD-10-CM

## 2021-09-19 DIAGNOSIS — Z5181 Encounter for therapeutic drug level monitoring: Secondary | ICD-10-CM | POA: Diagnosis not present

## 2021-09-19 DIAGNOSIS — Z7901 Long term (current) use of anticoagulants: Secondary | ICD-10-CM

## 2021-09-19 DIAGNOSIS — M7989 Other specified soft tissue disorders: Secondary | ICD-10-CM

## 2021-09-19 LAB — POCT INR: INR: 1.5 — AB (ref 2.0–3.0)

## 2021-09-19 NOTE — Patient Instructions (Signed)
Description   ?Take 1 tablet today and then START taking Warfarin 1/2 a tablet daily except for 1 tablet on Sundays, Wednesdays and Fridays.  ?Recheck INR in 10 days. [facility] [phone] ? ?  ?   ?

## 2021-09-30 ENCOUNTER — Ambulatory Visit (INDEPENDENT_AMBULATORY_CARE_PROVIDER_SITE_OTHER): Payer: Medicare HMO

## 2021-09-30 DIAGNOSIS — I4819 Other persistent atrial fibrillation: Secondary | ICD-10-CM | POA: Diagnosis not present

## 2021-09-30 DIAGNOSIS — Z5181 Encounter for therapeutic drug level monitoring: Secondary | ICD-10-CM

## 2021-09-30 DIAGNOSIS — Z7901 Long term (current) use of anticoagulants: Secondary | ICD-10-CM | POA: Diagnosis not present

## 2021-09-30 LAB — POCT INR: INR: 2.2 (ref 2.0–3.0)

## 2021-09-30 NOTE — Patient Instructions (Signed)
-   continue taking Warfarin 1/2 a tablet daily except for 1 tablet on Sundays, Wednesdays and Fridays.  ?- Recheck INR in 2 weeks ?[facility] [phone] ?

## 2021-10-16 ENCOUNTER — Ambulatory Visit (INDEPENDENT_AMBULATORY_CARE_PROVIDER_SITE_OTHER): Payer: Medicare HMO | Admitting: *Deleted

## 2021-10-16 DIAGNOSIS — Z5181 Encounter for therapeutic drug level monitoring: Secondary | ICD-10-CM | POA: Diagnosis not present

## 2021-10-16 DIAGNOSIS — I4819 Other persistent atrial fibrillation: Secondary | ICD-10-CM

## 2021-10-16 DIAGNOSIS — Z7901 Long term (current) use of anticoagulants: Secondary | ICD-10-CM | POA: Diagnosis not present

## 2021-10-16 LAB — POCT INR: INR: 2.9 (ref 2.0–3.0)

## 2021-10-16 NOTE — Patient Instructions (Signed)
Description   ?Continue taking Warfarin 1/2 tablet daily except for 1 tablet on Sundays, Wednesdays and Fridays. Recheck INR in 3 weeks. [facility] [phone] ? ?  ?  ?

## 2021-10-30 ENCOUNTER — Other Ambulatory Visit: Payer: Self-pay | Admitting: Cardiology

## 2021-11-07 ENCOUNTER — Ambulatory Visit (INDEPENDENT_AMBULATORY_CARE_PROVIDER_SITE_OTHER): Payer: Medicare HMO | Admitting: *Deleted

## 2021-11-07 DIAGNOSIS — Z7901 Long term (current) use of anticoagulants: Secondary | ICD-10-CM

## 2021-11-07 DIAGNOSIS — Z5181 Encounter for therapeutic drug level monitoring: Secondary | ICD-10-CM

## 2021-11-07 DIAGNOSIS — I4819 Other persistent atrial fibrillation: Secondary | ICD-10-CM | POA: Diagnosis not present

## 2021-11-07 LAB — POCT INR: INR: 1.5 — AB (ref 2.0–3.0)

## 2021-11-07 NOTE — Patient Instructions (Signed)
Description   Today take 1 tablet and tomorrow take 1.5 tablets then continue taking Warfarin 1/2 tablet daily except for 1 tablet on Sundays, Wednesdays and Fridays. Recheck INR in 1 week. [facility] [phone]

## 2021-11-14 ENCOUNTER — Ambulatory Visit (INDEPENDENT_AMBULATORY_CARE_PROVIDER_SITE_OTHER): Payer: Medicare HMO

## 2021-11-14 DIAGNOSIS — I4819 Other persistent atrial fibrillation: Secondary | ICD-10-CM | POA: Diagnosis not present

## 2021-11-14 DIAGNOSIS — Z5181 Encounter for therapeutic drug level monitoring: Secondary | ICD-10-CM | POA: Diagnosis not present

## 2021-11-14 DIAGNOSIS — Z7901 Long term (current) use of anticoagulants: Secondary | ICD-10-CM | POA: Diagnosis not present

## 2021-11-14 LAB — POCT INR: INR: 1.8 — AB (ref 2.0–3.0)

## 2021-11-14 NOTE — Patient Instructions (Signed)
Description   Take 1 tablet today, then start taking Warfarin 1/2 tablet daily except for 1 tablet on Sundays, Mondays, Wednesdays and Fridays. Recheck INR in 2 weeks. [facility] [phone]

## 2021-11-18 ENCOUNTER — Other Ambulatory Visit: Payer: Self-pay | Admitting: Cardiology

## 2021-11-20 DIAGNOSIS — E1169 Type 2 diabetes mellitus with other specified complication: Secondary | ICD-10-CM | POA: Diagnosis not present

## 2021-11-20 DIAGNOSIS — I10 Essential (primary) hypertension: Secondary | ICD-10-CM | POA: Diagnosis not present

## 2021-11-20 DIAGNOSIS — E039 Hypothyroidism, unspecified: Secondary | ICD-10-CM | POA: Diagnosis not present

## 2021-11-20 DIAGNOSIS — I4891 Unspecified atrial fibrillation: Secondary | ICD-10-CM | POA: Diagnosis not present

## 2021-11-20 DIAGNOSIS — E785 Hyperlipidemia, unspecified: Secondary | ICD-10-CM | POA: Diagnosis not present

## 2021-11-20 DIAGNOSIS — N1831 Chronic kidney disease, stage 3a: Secondary | ICD-10-CM | POA: Diagnosis not present

## 2021-11-27 ENCOUNTER — Ambulatory Visit (INDEPENDENT_AMBULATORY_CARE_PROVIDER_SITE_OTHER): Payer: Medicare HMO | Admitting: *Deleted

## 2021-11-27 DIAGNOSIS — I4819 Other persistent atrial fibrillation: Secondary | ICD-10-CM

## 2021-11-27 DIAGNOSIS — Z7901 Long term (current) use of anticoagulants: Secondary | ICD-10-CM | POA: Diagnosis not present

## 2021-11-27 DIAGNOSIS — Z5181 Encounter for therapeutic drug level monitoring: Secondary | ICD-10-CM

## 2021-11-27 LAB — POCT INR: INR: 1.8 — AB (ref 2.0–3.0)

## 2021-11-27 NOTE — Patient Instructions (Signed)
Description   Take 1.5 tablets today, then start taking Warfarin 1 tablet daily except for 1/2 tablet on Tuesdays and Saturdays. Recheck INR in 2 weeks. [facility] [phone]

## 2021-11-29 ENCOUNTER — Other Ambulatory Visit: Payer: Self-pay | Admitting: Cardiology

## 2021-11-29 DIAGNOSIS — I4819 Other persistent atrial fibrillation: Secondary | ICD-10-CM

## 2021-11-29 DIAGNOSIS — Z7901 Long term (current) use of anticoagulants: Secondary | ICD-10-CM

## 2021-11-29 MED ORDER — LOSARTAN POTASSIUM 100 MG PO TABS: 100.0000 mg | ORAL_TABLET | Freq: Every day | ORAL | 0 refills | Status: DC

## 2021-11-29 MED ORDER — WARFARIN SODIUM 2.5 MG PO TABS: ORAL_TABLET | ORAL | 1 refills | Status: DC

## 2021-11-29 MED ORDER — PRAVASTATIN SODIUM 40 MG PO TABS: 40.0000 mg | ORAL_TABLET | Freq: Every evening | ORAL | 0 refills | Status: DC

## 2021-11-29 NOTE — Telephone Encounter (Signed)
*  STAT* If patient is at the pharmacy, call can be transferred to refill team.   1. Which medications need to be refilled? (please list name of each medication and dose if known)   losartan (COZAAR) 100 MG tablet    pravastatin (PRAVACHOL) 40 MG tablet    warfarin (COUMADIN) 2.5 MG tablet    2. Which pharmacy/location (including street and city if local pharmacy) is medication to be sent to?  [place], [place] - [address] 3. Do they need a 30 day or 90 day supply?  30 day

## 2021-12-11 ENCOUNTER — Ambulatory Visit (INDEPENDENT_AMBULATORY_CARE_PROVIDER_SITE_OTHER): Payer: Medicare HMO

## 2021-12-11 DIAGNOSIS — Z7901 Long term (current) use of anticoagulants: Secondary | ICD-10-CM | POA: Diagnosis not present

## 2021-12-11 DIAGNOSIS — I4819 Other persistent atrial fibrillation: Secondary | ICD-10-CM

## 2021-12-11 DIAGNOSIS — Z5181 Encounter for therapeutic drug level monitoring: Secondary | ICD-10-CM | POA: Diagnosis not present

## 2021-12-11 LAB — POCT INR: INR: 4.5 — AB (ref 2.0–3.0)

## 2021-12-11 NOTE — Patient Instructions (Signed)
Description   Hold Warfarin today and tomorrow and then START taking Warfarin 1 tablet daily except for 1/2 tablet on Tuesdays, Thursdays, and Saturdays.  Stay consistent with Ensures (1 per week)  Recheck INR in 2 weeks.  [facility] [phone]

## 2021-12-13 ENCOUNTER — Other Ambulatory Visit: Payer: Self-pay | Admitting: Cardiology

## 2021-12-30 ENCOUNTER — Ambulatory Visit (INDEPENDENT_AMBULATORY_CARE_PROVIDER_SITE_OTHER): Payer: Medicare HMO | Admitting: *Deleted

## 2021-12-30 DIAGNOSIS — Z5181 Encounter for therapeutic drug level monitoring: Secondary | ICD-10-CM

## 2021-12-30 DIAGNOSIS — Z7901 Long term (current) use of anticoagulants: Secondary | ICD-10-CM | POA: Diagnosis not present

## 2021-12-30 DIAGNOSIS — I4819 Other persistent atrial fibrillation: Secondary | ICD-10-CM

## 2021-12-30 LAB — POCT INR: INR: 5.2 — AB (ref 2.0–3.0)

## 2021-12-30 NOTE — Patient Instructions (Signed)
Description   Hold Warfarin today and Hold Warfarin tomorrow and take 1/2 tablet Wednesday then START taking Warfarin 1/2 tablet daily except for 1 tablet on Monday, Wednesday, and Friday. Recheck INR in 1 week.   Stay consistent with Ensures (1 per week). [facility] [phone] or [phone]

## 2022-01-06 ENCOUNTER — Other Ambulatory Visit: Payer: Self-pay | Admitting: Cardiology

## 2022-01-08 ENCOUNTER — Ambulatory Visit (INDEPENDENT_AMBULATORY_CARE_PROVIDER_SITE_OTHER): Payer: Medicare HMO | Admitting: *Deleted

## 2022-01-08 DIAGNOSIS — Z5181 Encounter for therapeutic drug level monitoring: Secondary | ICD-10-CM | POA: Diagnosis not present

## 2022-01-08 DIAGNOSIS — Z7901 Long term (current) use of anticoagulants: Secondary | ICD-10-CM | POA: Diagnosis not present

## 2022-01-08 DIAGNOSIS — I4819 Other persistent atrial fibrillation: Secondary | ICD-10-CM

## 2022-01-08 LAB — POCT INR: INR: 3.2 — AB (ref 2.0–3.0)

## 2022-01-08 NOTE — Patient Instructions (Signed)
Description   Today take 1/2 tablet then START taking Warfarin 1/2 tablet daily except for 1 tablet on Monday and Friday. Recheck INR in 2 weeks. Stay consistent with Ensures (1 per week). [facility] [phone] or [phone]

## 2022-01-20 DIAGNOSIS — I10 Essential (primary) hypertension: Secondary | ICD-10-CM | POA: Diagnosis not present

## 2022-01-20 DIAGNOSIS — I4891 Unspecified atrial fibrillation: Secondary | ICD-10-CM | POA: Diagnosis not present

## 2022-01-20 DIAGNOSIS — E1169 Type 2 diabetes mellitus with other specified complication: Secondary | ICD-10-CM | POA: Diagnosis not present

## 2022-01-20 DIAGNOSIS — N1831 Chronic kidney disease, stage 3a: Secondary | ICD-10-CM | POA: Diagnosis not present

## 2022-01-20 DIAGNOSIS — E785 Hyperlipidemia, unspecified: Secondary | ICD-10-CM | POA: Diagnosis not present

## 2022-01-20 DIAGNOSIS — E039 Hypothyroidism, unspecified: Secondary | ICD-10-CM | POA: Diagnosis not present

## 2022-01-22 ENCOUNTER — Ambulatory Visit (INDEPENDENT_AMBULATORY_CARE_PROVIDER_SITE_OTHER): Payer: Medicare HMO

## 2022-01-22 DIAGNOSIS — Z7901 Long term (current) use of anticoagulants: Secondary | ICD-10-CM

## 2022-01-22 DIAGNOSIS — I4819 Other persistent atrial fibrillation: Secondary | ICD-10-CM

## 2022-01-22 DIAGNOSIS — Z5181 Encounter for therapeutic drug level monitoring: Secondary | ICD-10-CM

## 2022-01-22 LAB — POCT INR: INR: 2.9 (ref 2.0–3.0)

## 2022-01-22 NOTE — Patient Instructions (Signed)
Description   Continue taking Warfarin 1/2 tablet daily except for 1 tablet on Monday and Friday. Recheck INR in 3 weeks. Stay consistent with Ensure (1 per week). [facility] [phone] or [phone]

## 2022-02-05 ENCOUNTER — Other Ambulatory Visit: Payer: Self-pay | Admitting: Cardiology

## 2022-02-11 ENCOUNTER — Ambulatory Visit (INDEPENDENT_AMBULATORY_CARE_PROVIDER_SITE_OTHER): Payer: Medicare HMO

## 2022-02-11 DIAGNOSIS — Z7901 Long term (current) use of anticoagulants: Secondary | ICD-10-CM

## 2022-02-11 DIAGNOSIS — Z5181 Encounter for therapeutic drug level monitoring: Secondary | ICD-10-CM | POA: Diagnosis not present

## 2022-02-11 DIAGNOSIS — I4819 Other persistent atrial fibrillation: Secondary | ICD-10-CM | POA: Diagnosis not present

## 2022-02-11 LAB — POCT INR: INR: 2.8 (ref 2.0–3.0)

## 2022-02-11 NOTE — Patient Instructions (Signed)
Continue taking Warfarin 1/2 tablet daily except for 1 tablet on Monday and Friday. Recheck INR in 5 weeks. Stay consistent with Ensure (1 per week). [facility] [phone] or [phone]

## 2022-02-19 ENCOUNTER — Ambulatory Visit: Payer: Medicare HMO | Attending: Cardiology | Admitting: Cardiology

## 2022-02-19 ENCOUNTER — Ambulatory Visit
Admission: RE | Admit: 2022-02-19 | Discharge: 2022-02-19 | Disposition: A | Discharge status: Home / Self Care | Payer: Medicare HMO | Source: Ambulatory Visit | Attending: Cardiology | Admitting: Cardiology

## 2022-02-19 ENCOUNTER — Encounter: Payer: Self-pay | Admitting: Cardiology

## 2022-02-19 VITALS — BP 124/58 | HR 85 | Ht 62.0 in | Wt 108.6 lb

## 2022-02-19 DIAGNOSIS — I050 Rheumatic mitral stenosis: Secondary | ICD-10-CM

## 2022-02-19 DIAGNOSIS — I5032 Chronic diastolic (congestive) heart failure: Secondary | ICD-10-CM

## 2022-02-19 DIAGNOSIS — I351 Nonrheumatic aortic (valve) insufficiency: Secondary | ICD-10-CM | POA: Diagnosis not present

## 2022-02-19 DIAGNOSIS — I4821 Permanent atrial fibrillation: Secondary | ICD-10-CM

## 2022-02-19 DIAGNOSIS — I10 Essential (primary) hypertension: Secondary | ICD-10-CM | POA: Diagnosis not present

## 2022-02-19 DIAGNOSIS — I2510 Atherosclerotic heart disease of native coronary artery without angina pectoris: Secondary | ICD-10-CM

## 2022-02-19 DIAGNOSIS — R0602 Shortness of breath: Secondary | ICD-10-CM | POA: Diagnosis not present

## 2022-02-19 DIAGNOSIS — E7800 Pure hypercholesterolemia, unspecified: Secondary | ICD-10-CM | POA: Diagnosis not present

## 2022-02-19 NOTE — Progress Notes (Addendum)
Date:  02/19/2022   ID:  Ana [name], DOB December 08, 1931, MRN [id number]  PCP:  London Pepper, MD  Cardiologist:  Fransico Him, MD Electrophysiologist:  None   Chief Complaint:  CAD, atrial flutter, HLD, HTN, CHF  History of Present Illness:    Ana [name] is a 88 y.o. female with a hx of permanent atrial flutter, CAD (multivessel CAD s/p PCI with BMS to the LAD and CFX and subsequent staged PCI with BMS to the RCA), chronic diastolic CHF, pulmonary HTN, minimal mitral stenosis (echo 09/2012), HTN, HL and anemia of chronic disease .   Repeat 2D echo 11/09/2020 showed no evidence of mitral stenosis and trivial MR.  There was also trivial AI and mild aortic valve sclerosis with normal EF.  Nuclear stress test a year ago showed no ischemia.  She is here today for followup and is doing well.  She is complaining of SOB with any movement when in the house which is new in the past year.  SHe has chronic LE edema which is stable. She denies any chest pain or pressure, PND, orthopnea, LE edema, palpitations or syncope. She also has had some problems with dizziness when laying in bed and turns her head. She is compliant with her meds and is tolerating meds with no SE.     Prior CV studies:   The following studies were reviewed today:  2D echo 03/2019 IMPRESSIONS    1. Left ventricular ejection fraction, by visual estimation, is 55 to  60%. The left ventricle has normal function. There is no left ventricular  hypertrophy.   2. Global right ventricle has mildly reduced systolic function.The right  ventricular size is normal. No increase in right ventricular wall  thickness.   3. Left atrial size was normal.   4. Right atrial size was normal.   5. Moderate aortic valve annular calcification.   6. The mitral valve is normal in structure. Mild mitral valve  regurgitation.   7. The tricuspid valve is normal in structure. Tricuspid valve  regurgitation is trivial.   8. The aortic valve is  abnormal. Aortic valve regurgitation is trivial.  Mild aortic valve sclerosis without stenosis.   9. The pulmonic valve was normal in structure. Pulmonic valve  regurgitation is not visualized.  10. The atrial septum is grossly normal.   Past Medical History:  Diagnosis Date   Anemia of chronic disease    Aortic insufficiency    moderate by echo 03/2018   Chronic diastolic CHF (congestive heart failure) ([place]) 2014   Diabetes mellitus without complication ([place])    Dizziness 02/13/2015   Hyperlipidemia    Hypertension    Hypothyroidism    MI, old 07/18/2013   Mitral stenosis    mild by echo 03/2018   Permanent atrial fibrillation ([place])    Pulmonary HTN ([facility])    PASP 52mHg 03/2018   Urinary incontinence    Vitamin D deficiency disease    Past Surgical History:  Procedure Laterality Date   CATARACT EXTRACTION     LAPAROSCOPIC APPENDECTOMY N/A 04/14/2017   Procedure: APPENDECTOMY LAPAROSCOPIC;  Surgeon: IFanny Skates MD;  Location: [facility];  Service: General;  Laterality: N/A;   LEFT HEART CATHETERIZATION WITH CORONARY ANGIOGRAM N/A 01/17/2014   Procedure: LEFT HEART CATHETERIZATION WITH CORONARY ANGIOGRAM;  Surgeon: TTroy Sine MD;  [facility];  Service: Cardiovascular;  Laterality: N/A;   PERCUTANEOUS CORONARY STENT INTERVENTION (PCI-S)  01/17/2014   Procedure: PERCUTANEOUS CORONARY STENT INTERVENTION (PCI-S);  Surgeon: Troy Sine, MD;  Location: [facility] C[facility] LAB;  Service: Cardiovascular;;   PERCUTANEOUS CORONARY STENT INTERVENTION (PCI-S) N/A 01/19/2014   Procedure: PERCUTANEOUS CORONARY STENT INTERVENTION (PCI-S);  Surgeon: Leonie Man, MD;  Location: [facility] CATH LAB;  Service: Cardiovascular;  Laterality: N/A;     Current Meds  Medication Sig   acetaminophen (TYLENOL) 500 MG tablet Take 500 mg by mouth every 8 (eight) hours as needed for moderate pain or headache.    amLODipine (NORVASC) 5 MG tablet Take 1 tablet by mouth once daily   aspirin EC 81 MG tablet  Take 81 mg by mouth daily.   Cholecalciferol (VITAMIN D3) 2000 UNITS TABS Take 2,000 Units by mouth daily.    Coenzyme Q10 200 MG capsule Take 200 mg by mouth daily.   Ferrous Sulfate (SLOW FE PO) Take 1 tablet by mouth daily.   levothyroxine (SYNTHROID) 50 MCG tablet Take 50 mcg by mouth daily.   losartan (COZAAR) 100 MG tablet Take 1 tablet (100 mg total) by mouth daily.   metFORMIN (GLUCOPHAGE) 1000 MG tablet Take 1 tablet (1,000 mg total) by mouth 2 (two) times daily with a meal.   Omega-3 Krill Oil 500 MG CAPS Take 500 mg by mouth daily.    phenylephrine-shark liver oil-mineral oil-petrolatum (PREPARATION H) 0.25-3-14-71.9 % rectal ointment Place 1 application rectally 2 (two) times daily as needed for hemorrhoids.   polyethylene glycol (MIRALAX / GLYCOLAX) packet Take 17 g by mouth daily as needed for moderate constipation.    pravastatin (PRAVACHOL) 40 MG tablet Take 1 tablet (40 mg total) by mouth every evening.   vitamin B-12 (CYANOCOBALAMIN) 1000 MCG tablet Take 1,000 mcg by mouth daily.   warfarin (COUMADIN) 2.5 MG tablet Take 1 tablet daily except 1/2 tablet on Tuesday and Saturday or as directed by Anticoagulation Clinic.     Allergies:   Welchol [colesevelam hcl], Zetia [ezetimibe], Statins, Crestor [rosuvastatin], and Zocor [simvastatin]   Social History   Tobacco Use   Smoking status: Never   Smokeless tobacco: Never  Vaping Use   Vaping Use: Never used  Substance Use Topics   Alcohol use: No   Drug use: No     Family Hx: The patient's family history includes Cancer in her mother; Cancer (age of onset: 51) in her sister and another family member; Cirrhosis in her father; Diabetes in her brother; Heart attack in her sister; Heart disease in her sister.  ROS:   Please see the history of present illness.     All other systems reviewed and are negative.   Labs/Other Tests and Data Reviewed:    Recent Labs: No results found for requested labs within last 365 days.    Recent Lipid Panel Lab Results  Component Value Date/Time   CHOL 110 11/08/2020 10:29 AM   TRIG 64 11/08/2020 10:29 AM   HDL 45 11/08/2020 10:29 AM   CHOLHDL 2.4 11/08/2020 10:29 AM   CHOLHDL 3.2 06/02/2016 11:01 AM   LDLCALC 51 11/08/2020 10:29 AM    Wt Readings from Last 3 Encounters:  02/19/22 108 lb 9.6 oz (49.3 kg)  11/08/20 131 lb (59.4 kg)  09/25/20 131 lb 3.2 oz (59.5 kg)     EKG was performed today and demonstrated NSR with nonspecific ST abnormality  Objective:    Vital Signs:  BP (!) 124/58   Pulse 85   Ht '5\' 2"'$  (1.575 m)   Wt 108 lb 9.6 oz (49.3 kg)   SpO2 97%   BMI  19.86 kg/m   GEN: Well nourished, well developed in no acute distress HEENT: Normal NECK: No JVD; No carotid bruits LYMPHATICS: No lymphadenopathy CARDIAC:irregularly irregular, no murmurs, rubs, gallops RESPIRATORY:  Clear to auscultation without rales, wheezing or rhonchi  ABDOMEN: Soft, non-tender, non-distended MUSCULOSKELETAL:  No edema; No deformity  SKIN: Warm and dry NEUROLOGIC:  Alert and oriented x 3 PSYCHIATRIC:  Normal affect  ASSESSMENT & PLAN:    1.  Chronic diastolic CHF/SOB -She is euvolemic on exam and weight is stable actually has lost 13lbs in the past year -she is complaining of SOB with any movement when in the house which is new in the past year -she has not required any diuretics -check Chest xray, BNP, TSH and CBC -check 2D echo to make sure LVF still is normal -nuclear stress test a year ago when she complained of SOB that showed no ischemia  2.  ASCAD -multivessel CAD s/p PCI with BMS to the LAD and CFX and subsequent staged PCI with BMS to the RCA. -Nuclear stress test a year ago showed no ischemia -She denies any anginal symptoms -Continue aspirin 81 mg daily and statin therapy with as needed refills  3.  HTN -BP is adequately controlled on exam today -continue Prescription drug management losartan 100 mg daily and amlodipine 5 mg daily with as needed  refills -I have personally reviewed and interpreted outside labs performed by patient's PCP which showed serum creatinine 1.1 and potassium 4.4 on 08/21/2021  4.  Permanent atrial fibrillation -She denies any palpitations and her heart rate is well controlled -she has not required any CCB or BB -She denies any bleeding complications on anticoagulation -Warfarin and with as needed refills followed in Coumadin clinic -I have personally reviewed and interpreted outside labs performed by patient's PCP which showed hemoglobin 11.1 on 08/21/2021  5.  Hyperlipidemia -LDL goal < 70 -I have personally reviewed and interpreted outside labs performed by patient's PCP which showed LDL 67 HDL 58 on 08/21/2021 -Continue prescription drug management with pravastatin 20 mg daily with as needed refills  6.  Mitral stenosis -Repeat echo 11/09/2020 showed EF 55 to 60% with mild MR and no mitral stenosis  7.  Moderate AR -trivial AR by echo 03/2019 but had been moderate in the past -Repeat echo 11/09/2020 trivial AI and mild aortic valve sclerosis with no stenosis   Medication Adjustments/Labs and Tests Ordered: Current medicines are reviewed at length with the patient today.  Concerns regarding medicines are outlined above.  Tests Ordered: Orders Placed This Encounter  Procedures   EKG 12-Lead   Medication Changes: No orders of the defined types were placed in this encounter.   Disposition:  Follow up in 1 year(s)  Signed, Fransico Him, MD  02/19/2022 2:23 PM    [place]

## 2022-02-19 NOTE — Patient Instructions (Signed)
Medication Instructions:  Your physician has requested that you have an echocardiogram. Echocardiography is a painless test that uses sound waves to create images of your heart. It provides your doctor with information about the size and shape of your heart and how well your heart's chambers and valves are working. This procedure takes approximately one hour. There are no restrictions for this procedure.  *If you need a refill on your cardiac medications before your next appointment, please call your pharmacy*  Lab Work: TODAY: BNP, TSH, CBC If you have labs (blood work) drawn today and your tests are completely normal, you will receive your results only by: [place] (if you have MyChart) OR A paper copy in the mail If you have any lab test that is abnormal or we need to change your treatment, we will call you to review the results.  Testing/Procedures: A chest x-ray takes a picture of the organs and structures inside the chest, including the heart, lungs, and blood vessels. This test can show several things, including, whether the heart is enlarges; whether fluid is building up in the lungs; and whether pacemaker / defibrillator leads are still in place.  Your physician has requested that you have an echocardiogram. Echocardiography is a painless test that uses sound waves to create images of your heart. It provides your doctor with information about the size and shape of your heart and how well your heart's chambers and valves are working. This procedure takes approximately one hour. There are no restrictions for this procedure.  Follow-Up: At [facility], you and your health needs are our priority.  As part of our continuing mission to provide you with exceptional heart care, we have created designated Provider Care Teams.  These Care Teams include your primary Cardiologist (physician) and Advanced Practice Providers (APPs -  Physician Assistants and Nurse Practitioners) who all  work together to provide you with the care you need, when you need it.   Your next appointment:   1 year(s)  The format for your next appointment:   In Person  Provider:   Fransico Him, MD  Important Information About Sugar

## 2022-02-19 NOTE — Addendum Note (Signed)
Addended by: [name] on: 02/19/2022 02:31 PM   Modules accepted: Orders

## 2022-02-20 ENCOUNTER — Telehealth: Payer: Self-pay

## 2022-02-20 DIAGNOSIS — R918 Other nonspecific abnormal finding of lung field: Secondary | ICD-10-CM

## 2022-02-20 LAB — CBC
Hematocrit: 30.5 % — ABNORMAL LOW (ref 34.0–46.6)
Hemoglobin: 10 g/dL — ABNORMAL LOW (ref 11.1–15.9)
MCH: 29.6 pg (ref 26.6–33.0)
MCHC: 32.8 g/dL (ref 31.5–35.7)
MCV: 90 fL (ref 79–97)
Platelets: 274 10*3/uL (ref 150–450)
RBC: 3.38 x10E6/uL — ABNORMAL LOW (ref 3.77–5.28)
RDW: 12.3 % (ref 11.7–15.4)
WBC: 7.1 10*3/uL (ref 3.4–10.8)

## 2022-02-20 LAB — TSH: TSH: 1.71 u[IU]/mL (ref 0.450–4.500)

## 2022-02-20 LAB — PRO B NATRIURETIC PEPTIDE: NT-Pro BNP: 1947 pg/mL — ABNORMAL HIGH (ref 0–738)

## 2022-02-20 NOTE — Telephone Encounter (Signed)
-----   Message from Sueanne Margarita, MD sent at 02/20/2022  1:57 PM EDT ----- Abnormal chest x-ray now with bilateral opacities that are nonspecific and could be due to interstitial lung disease.  This actually may be why her BNP is elevated.  Please get a high-resolution chest CT with no contrast needed.  I would like this done ASAP for shortness of breath and abnormal chest x-ray

## 2022-02-20 NOTE — Telephone Encounter (Signed)
The patient has been notified of the result and verbalized understanding.  All questions (if any) were answered. [name], RN 02/20/2022 4:05 PM  CT scan has been ordered.

## 2022-02-21 ENCOUNTER — Other Ambulatory Visit: Payer: Medicare HMO

## 2022-02-25 ENCOUNTER — Ambulatory Visit
Admission: RE | Admit: 2022-02-25 | Discharge: 2022-02-25 | Disposition: A | Discharge status: Home / Self Care | Payer: Medicare HMO | Source: Ambulatory Visit | Attending: Cardiology | Admitting: Cardiology

## 2022-02-25 ENCOUNTER — Telehealth: Payer: Self-pay

## 2022-02-25 DIAGNOSIS — J90 Pleural effusion, not elsewhere classified: Secondary | ICD-10-CM | POA: Diagnosis not present

## 2022-02-25 DIAGNOSIS — R918 Other nonspecific abnormal finding of lung field: Secondary | ICD-10-CM

## 2022-02-25 DIAGNOSIS — J84112 Idiopathic pulmonary fibrosis: Secondary | ICD-10-CM | POA: Diagnosis not present

## 2022-02-25 DIAGNOSIS — J479 Bronchiectasis, uncomplicated: Secondary | ICD-10-CM | POA: Diagnosis not present

## 2022-02-25 DIAGNOSIS — J8410 Pulmonary fibrosis, unspecified: Secondary | ICD-10-CM

## 2022-02-25 DIAGNOSIS — J941 Fibrothorax: Secondary | ICD-10-CM | POA: Diagnosis not present

## 2022-02-25 NOTE — Telephone Encounter (Signed)
The patient has been notified of the result and verbalized understanding.  All questions (if any) were answered. [name], RN 02/25/2022 2:36 PM  Referral has been placed.

## 2022-02-25 NOTE — Telephone Encounter (Signed)
-----   Message from Sueanne Margarita, MD sent at 02/25/2022 10:08 AM EDT ----- High-resolution chest CT consistent with mild pulmonary fibrosis and bronchiectasis.  There is concern this could be chronic hypersensitivity pneumonitis or sarcoidosis.  Please get her into see Dr. [place] Bells or [facility] ASAP.  Please have her stop the diuretics as I could BNP is elevated from a primary pulmonary process

## 2022-02-26 DIAGNOSIS — E785 Hyperlipidemia, unspecified: Secondary | ICD-10-CM | POA: Diagnosis not present

## 2022-02-26 DIAGNOSIS — Z23 Encounter for immunization: Secondary | ICD-10-CM | POA: Diagnosis not present

## 2022-02-26 DIAGNOSIS — R42 Dizziness and giddiness: Secondary | ICD-10-CM | POA: Diagnosis not present

## 2022-02-26 DIAGNOSIS — D649 Anemia, unspecified: Secondary | ICD-10-CM | POA: Diagnosis not present

## 2022-02-26 DIAGNOSIS — I4891 Unspecified atrial fibrillation: Secondary | ICD-10-CM | POA: Diagnosis not present

## 2022-02-26 DIAGNOSIS — I2510 Atherosclerotic heart disease of native coronary artery without angina pectoris: Secondary | ICD-10-CM | POA: Diagnosis not present

## 2022-02-26 DIAGNOSIS — I10 Essential (primary) hypertension: Secondary | ICD-10-CM | POA: Diagnosis not present

## 2022-02-26 DIAGNOSIS — R2689 Other abnormalities of gait and mobility: Secondary | ICD-10-CM | POA: Diagnosis not present

## 2022-02-26 DIAGNOSIS — E1169 Type 2 diabetes mellitus with other specified complication: Secondary | ICD-10-CM | POA: Diagnosis not present

## 2022-03-01 ENCOUNTER — Other Ambulatory Visit: Payer: Self-pay | Admitting: Cardiology

## 2022-03-05 ENCOUNTER — Other Ambulatory Visit: Payer: Self-pay | Admitting: Cardiology

## 2022-03-06 ENCOUNTER — Other Ambulatory Visit (HOSPITAL_COMMUNITY): Payer: Medicare HMO

## 2022-03-18 ENCOUNTER — Ambulatory Visit (HOSPITAL_BASED_OUTPATIENT_CLINIC_OR_DEPARTMENT_OTHER): Payer: Medicare HMO

## 2022-03-18 ENCOUNTER — Ambulatory Visit: Payer: Medicare HMO | Attending: Cardiology | Admitting: *Deleted

## 2022-03-18 DIAGNOSIS — E7800 Pure hypercholesterolemia, unspecified: Secondary | ICD-10-CM

## 2022-03-18 DIAGNOSIS — I10 Essential (primary) hypertension: Secondary | ICD-10-CM | POA: Diagnosis not present

## 2022-03-18 DIAGNOSIS — I2510 Atherosclerotic heart disease of native coronary artery without angina pectoris: Secondary | ICD-10-CM

## 2022-03-18 DIAGNOSIS — I050 Rheumatic mitral stenosis: Secondary | ICD-10-CM

## 2022-03-18 DIAGNOSIS — I5032 Chronic diastolic (congestive) heart failure: Secondary | ICD-10-CM | POA: Diagnosis not present

## 2022-03-18 DIAGNOSIS — Z7901 Long term (current) use of anticoagulants: Secondary | ICD-10-CM | POA: Insufficient documentation

## 2022-03-18 DIAGNOSIS — I4821 Permanent atrial fibrillation: Secondary | ICD-10-CM | POA: Insufficient documentation

## 2022-03-18 DIAGNOSIS — I351 Nonrheumatic aortic (valve) insufficiency: Secondary | ICD-10-CM | POA: Diagnosis present

## 2022-03-18 DIAGNOSIS — Z5181 Encounter for therapeutic drug level monitoring: Secondary | ICD-10-CM | POA: Diagnosis not present

## 2022-03-18 DIAGNOSIS — I4819 Other persistent atrial fibrillation: Secondary | ICD-10-CM | POA: Insufficient documentation

## 2022-03-18 DIAGNOSIS — R0602 Shortness of breath: Secondary | ICD-10-CM

## 2022-03-18 LAB — ECHOCARDIOGRAM COMPLETE
Area-P 1/2: 4.6 cm2
MV VTI: 1.72 cm2
P 1/2 time: 339 msec
S' Lateral: 3 cm

## 2022-03-18 LAB — POCT INR: INR: 4.5 — AB (ref 2.0–3.0)

## 2022-03-18 NOTE — Patient Instructions (Signed)
Description   Do not take any Warfarin today and No Warfarin tomorrow then continue taking Warfarin 1/2 tablet daily except for 1 tablet on Monday and Friday. Recheck INR in 2 weeks. Stay consistent with Ensure (1 per week). Cou[facility]in Clinic [phone] or [phone]

## 2022-03-25 ENCOUNTER — Encounter: Payer: Self-pay | Admitting: Internal Medicine

## 2022-03-25 ENCOUNTER — Ambulatory Visit: Payer: Medicare HMO | Admitting: Internal Medicine

## 2022-03-25 DIAGNOSIS — R0609 Other forms of dyspnea: Secondary | ICD-10-CM | POA: Diagnosis not present

## 2022-03-25 LAB — CBC WITH DIFFERENTIAL/PLATELET
Basophils Absolute: 0.1 10*3/uL (ref 0.0–0.1)
Basophils Relative: 0.9 % (ref 0.0–3.0)
Eosinophils Absolute: 0.2 10*3/uL (ref 0.0–0.7)
Eosinophils Relative: 3.4 % (ref 0.0–5.0)
HCT: 30.6 % — ABNORMAL LOW (ref 36.0–46.0)
Hemoglobin: 10.2 g/dL — ABNORMAL LOW (ref 12.0–15.0)
Lymphocytes Relative: 39.4 % (ref 12.0–46.0)
Lymphs Abs: 2.7 10*3/uL (ref 0.7–4.0)
MCHC: 33.4 g/dL (ref 30.0–36.0)
MCV: 91.7 fl (ref 78.0–100.0)
Monocytes Absolute: 0.6 10*3/uL (ref 0.1–1.0)
Monocytes Relative: 8.7 % (ref 3.0–12.0)
Neutro Abs: 3.2 10*3/uL (ref 1.4–7.7)
Neutrophils Relative %: 47.6 % (ref 43.0–77.0)
Platelets: 267 10*3/uL (ref 150.0–400.0)
RBC: 3.34 Mil/uL — ABNORMAL LOW (ref 3.87–5.11)
RDW: 13.9 % (ref 11.5–15.5)
WBC: 6.8 10*3/uL (ref 4.0–10.5)

## 2022-03-25 LAB — SEDIMENTATION RATE: Sed Rate: 39 mm/hr — ABNORMAL HIGH (ref 0–30)

## 2022-03-25 NOTE — Assessment & Plan Note (Addendum)
Onset around fall 2022  -  HRCT  02/25/22  ? HSP  With changes dating back to 2015 which was about the same time she moved into present dwelling with no  obvious  HSP exp risks identified -  ECHO 03/18/22 mild/mod MR/AR with mod LAE and PAS 69  - HSP serology 03/25/2022 >>>  - NSIP serology 03/25/2022 >>>  - 03/25/2022   Walked on RA  x  2  lap(s) =  approx 500  ft  @ slow/walker pace, stopped due to sob with lowest 02 sats 96%    Her doe is likely multifactorial with high L Ht pressures and ? WHO 2 PH but not desaturation on walking at her pace and longstanding non-diagnostic ILD on CT chest  rec Complete the w/u as above and do serial sats at highest level of exertion at home to "connect the dots" over time   F/u in 6 months to regroup  And stay as active as possible in meantime.    Each maintenance medication was reviewed in detail including emphasizing most importantly the difference between maintenance and prns and under what circumstances the prns are to be triggered using an action plan format where appropriate.  Total time for H and P, chart review, counseling,  directly observing portions of ambulatory 02 saturation study/ and generating customized AVS unique to this office visit / same day charting = 46 min

## 2022-03-25 NOTE — Progress Notes (Signed)
Ana [name], female    DOB: Jun 13, 1932   MRN: [id number]   Brief patient profile:  43  yowf never smoker referred to pulmonary clinic 03/25/2022 by Fransico Him for new onset doe with ? HSP     History of Present Illness  03/25/2022  Pulmonary/ 1st office eval/[name]  Chief Complaint  Patient presents with   Consult    She is having some shortness of breath with exertion,   Dyspnea:  cooking, cleaning/rollator room to room slowed by balance and back  Cough: none  Sleep: bed is flat with 2 pillows  SABA use: none   No obvious day to day or daytime pattern/variability or assoc excess/ purulent sputum or mucus plugs or hemoptysis or cp or chest tightness, subjective wheeze or overt sinus or hb symptoms.   Sleeping  without nocturnal  or early am exacerbation  of respiratory  c/o's or need for noct saba. Also denies any obvious fluctuation of symptoms with weather or environmental changes or other aggravating or alleviating factors except as outlined above   No unusual exposure hx or h/o childhood pna/ asthma or knowledge of premature birth.  Current Allergies, Complete Past Medical History, Past Surgical History, Family History, and Social History were reviewed in [employer] record.  ROS  The following are not active complaints unless bolded Hoarseness, sore throat, dysphagia, dental problems, itching, sneezing,  nasal congestion or discharge of excess mucus or purulent secretions, ear ache,   fever, chills, sweats, unintended wt loss or wt gain, classically pleuritic or exertional cp,  orthopnea pnd or arm/hand swelling  or leg swelling, presyncope, palpitations, abdominal pain, anorexia, nausea, vomiting, diarrhea  or change in bowel habits or change in bladder habits, change in stools or change in urine, dysuria, hematuria,  rash, arthralgias, visual complaints, headache, numbness, weakness or ataxia or problems with walking or coordination,  change in mood or   memory.           Past Medical History:  Diagnosis Date   Anemia of chronic disease    Aortic insufficiency    moderate by echo 03/2018   Chronic diastolic CHF (congestive heart failure) ([facility]) 2014   Diabetes mellitus without complication ([place])    Dizziness 02/13/2015   Hyperlipidemia    Hypertension    Hypothyroidism    MI, old 07/18/2013   Mitral stenosis    mild by echo 03/2018   Permanent atrial fibrillation ([facility])    Pulmonary HTN ([facility])    PASP 78mHg 03/2018   Urinary incontinence    Vitamin D deficiency disease     Outpatient Medications Prior to Visit  Medication Sig Dispense Refill   acetaminophen (TYLENOL) 500 MG tablet Take 500 mg by mouth every 8 (eight) hours as needed for moderate pain or headache.      amLODipine (NORVASC) 5 MG tablet TAKE 1 TABLET BY MOUTH ONCE DAILY. MUST  KEEP  UPCOMING  APPOINTMENT  IN  AUGUST  2023  FOR  ADDITIONAL  REFILLS 30 tablet 11   aspirin EC 81 MG tablet Take 81 mg by mouth daily.     Cholecalciferol (VITAMIN D3) 2000 UNITS TABS Take 2,000 Units by mouth daily.      Coenzyme Q10 200 MG capsule Take 200 mg by mouth daily.     Ferrous Sulfate (SLOW FE PO) Take 1 tablet by mouth daily.     levothyroxine (SYNTHROID) 50 MCG tablet Take 50 mcg by mouth daily.     losartan (  COZAAR) 100 MG tablet Take 1 tablet by mouth once daily 90 tablet 3   metFORMIN (GLUCOPHAGE) 1000 MG tablet Take 1 tablet (1,000 mg total) by mouth 2 (two) times daily with a meal. 60 tablet 0   Omega-3 Krill Oil 500 MG CAPS Take 500 mg by mouth daily.      phenylephrine-shark liver oil-mineral oil-petrolatum (PREPARATION H) 0.25-3-14-71.9 % rectal ointment Place 1 application rectally 2 (two) times daily as needed for hemorrhoids.     polyethylene glycol (MIRALAX / GLYCOLAX) packet Take 17 g by mouth daily as needed for moderate constipation.      pravastatin (PRAVACHOL) 40 MG tablet TAKE 1 TABLET BY MOUTH ONCE DAILY IN THE EVENING 90 tablet 3   vitamin B-12  (CYANOCOBALAMIN) 1000 MCG tablet Take 1,000 mcg by mouth daily.     warfarin (COUMADIN) 2.5 MG tablet Take 1 tablet daily except 1/2 tablet on Tuesday and Saturday or as directed by Anticoagulation Clinic. 90 tablet 1   No facility-administered medications prior to visit.     Objective:     BP 108/62 (BP Location: Left Arm, Cuff Size: Normal)   Pulse 82   Temp 97.7 F (36.5 C)   Ht '5\' 2"'$  (1.575 m)   Wt 113 lb 6.4 oz (51.4 kg)   SpO2 99% Comment: ra  BMI 20.74 kg/m   SpO2: 99 % (ra)  Amb wf /rollator pale elderly    HEENT : Oropharynx  clear      Nasal turbinates nl    NECK :  without  apparent JVD/ palpable Nodes/TM    LUNGS: no acc muscle use,  mild T Kyphotic contour chest which is clear to A and P bilaterally without cough on insp or exp maneuvers   CV:  RRR  no s3  2/6 HSM plus  2/6 Diastolic murmur s def increase in P2, and no edema   ABD:  soft and nontender with nl inspiratory excursion in the supine position. No bruits or organomegaly appreciated   MS:  Nl gait/ ext warm without deformities Or obvious joint restrictions  calf tenderness, cyanosis or clubbing    SKIN: warm and dry without lesions    NEURO:  alert, approp, nl sensorium with  no motor or cerebellar deficits apparent.     I personally reviewed images and agree with radiology impression as follows:   Chest HRCT  02/25/22  1. Mild pulmonary fibrosis in a pattern with slight apical to basal gradient although with a somewhat heterogeneous distribution, featuring irregular peripheral interstitial opacity, septal thickening, extensive, fine nodularity, generally in a perilymphatic distribution, much of which is concentrated along the fissures and pleural surfaces, as well as small areas of traction bronchiectasis and subpleural bronchiolectasis at the lung bases. Comparison to prior CT angiogram dated 01/14/2014 is significantly limited by technique, the presence of acute airspace disease, and  pleural effusions on prior examination, however fibrotic findings appear slightly worsened. Mosaic attenuation of the airspaces and lobular air trapping on expiratory phase imaging. This unusual pattern of findings is in an "alternative diagnosis" pattern by ATS criteria, and strongly suggests chronic hypersensitivity pneumonitis, although sarcoidosis could have this appearance. Findings are suggestive of an alternative diagnosis (not UIP)  2. Enlargement of the main pulmonary artery, which can be seen in pulmonary hypertension. 3. Coronary artery disease.      Assessment   DOE (dyspnea on exertion) Onset around fall 2022  -  HRCT  02/25/22  ? HSP  With changes dating back  to 2015 which was about the same time she moved into present dwelling with no  obvious  HSP exp risks identified -  ECHO 03/18/22 mild/mod MR/AR with mod LAE and PAS 69  - HSP serology 03/25/2022 >>>  - NSIP serology 03/25/2022 >>>  - 03/25/2022   Walked on RA  x  2  lap(s) =  approx 500  ft  @ slow/walker pace, stopped due to sob with lowest 02 sats 96%    Her doe is likely multifactorial with high L Ht pressures and ? WHO 2 PH but not desaturation on walking at her pace and longstanding non-diagnostic ILD on CT chest  rec Complete the w/u as above and do serial sats at highest level of exertion at home to "connect the dots" over time   F/u in 6 months to regroup  And stay as active as possible in meantime.    Each maintenance medication was reviewed in detail including emphasizing most importantly the difference between maintenance and prns and under what circumstances the prns are to be triggered using an action plan format where appropriate.  Total time for H and P, chart review, counseling,  directly observing portions of ambulatory 02 saturation study/ and generating customized AVS unique to this office visit / same day charting = 46 min                    Christinia Gully, MD 03/25/2022

## 2022-03-25 NOTE — Patient Instructions (Addendum)
Make sure you check your oxygen saturation at your highest level of activity to be sure it stays over 90% and keep track of it at least once a week, more often if breathing getting worse, and let me know if losing ground.   Please remember to go to the lab department   for your tests - we will call you with the results when they are available.      Please schedule a follow up visit in 6  months but call sooner if needed

## 2022-04-02 LAB — RHEUMATOID FACTOR: Rheumatoid fact SerPl-aCnc: <14 IU/mL (ref <14)

## 2022-04-02 LAB — HYPERSENSITIVITY PNUEMONITIS PROFILE
ASPERGILLUS FUMIGATUS: NEGATIVE
Faenia retivirgula: NEGATIVE
Pigeon Serum: NEGATIVE
S. VIRIDIS: NEGATIVE
T. CANDIDUS: NEGATIVE
T. VULGARIS: NEGATIVE

## 2022-04-02 LAB — CYCLIC CITRUL PEPTIDE ANTIBODY, IGG: Cyclic Citrullin Peptide Ab: <16 UNITS

## 2022-04-02 LAB — ANA: Anti Nuclear Antibody (ANA): NEGATIVE

## 2022-04-03 ENCOUNTER — Ambulatory Visit: Payer: Medicare HMO | Attending: Cardiology

## 2022-04-03 DIAGNOSIS — Z5181 Encounter for therapeutic drug level monitoring: Secondary | ICD-10-CM | POA: Diagnosis not present

## 2022-04-03 DIAGNOSIS — Z7901 Long term (current) use of anticoagulants: Secondary | ICD-10-CM | POA: Diagnosis not present

## 2022-04-03 DIAGNOSIS — I4819 Other persistent atrial fibrillation: Secondary | ICD-10-CM

## 2022-04-03 LAB — POCT INR: INR: 2.6 (ref 2.0–3.0)

## 2022-04-03 NOTE — Patient Instructions (Signed)
Description   Continue taking Warfarin 1/2 tablet daily except for 1 tablet on Monday and Friday. Recheck INR in 4 weeks. Stay consistent with Ensure (1 per week). [facility] [phone]

## 2022-04-14 DIAGNOSIS — N1831 Chronic kidney disease, stage 3a: Secondary | ICD-10-CM | POA: Diagnosis not present

## 2022-04-14 DIAGNOSIS — E039 Hypothyroidism, unspecified: Secondary | ICD-10-CM | POA: Diagnosis not present

## 2022-04-14 DIAGNOSIS — E1169 Type 2 diabetes mellitus with other specified complication: Secondary | ICD-10-CM | POA: Diagnosis not present

## 2022-04-14 DIAGNOSIS — I10 Essential (primary) hypertension: Secondary | ICD-10-CM | POA: Diagnosis not present

## 2022-04-14 DIAGNOSIS — E785 Hyperlipidemia, unspecified: Secondary | ICD-10-CM | POA: Diagnosis not present

## 2022-05-01 ENCOUNTER — Ambulatory Visit: Payer: Medicare HMO | Attending: Cardiovascular Disease

## 2022-05-01 DIAGNOSIS — Z7901 Long term (current) use of anticoagulants: Secondary | ICD-10-CM

## 2022-05-01 DIAGNOSIS — I4819 Other persistent atrial fibrillation: Secondary | ICD-10-CM | POA: Diagnosis not present

## 2022-05-01 DIAGNOSIS — Z5181 Encounter for therapeutic drug level monitoring: Secondary | ICD-10-CM | POA: Diagnosis not present

## 2022-05-01 LAB — POCT INR: INR: 2.8 (ref 2.0–3.0)

## 2022-05-01 NOTE — Patient Instructions (Signed)
Description   Continue taking Warfarin 1/2 tablet daily except for 1 tablet on Monday and Friday. Recheck INR in 5 weeks.  Stay consistent with Ensure (1 per week).  [facility] Clinic [phone]

## 2022-05-30 ENCOUNTER — Other Ambulatory Visit: Payer: Self-pay | Admitting: Cardiology

## 2022-05-30 DIAGNOSIS — I4819 Other persistent atrial fibrillation: Secondary | ICD-10-CM

## 2022-05-30 DIAGNOSIS — Z7901 Long term (current) use of anticoagulants: Secondary | ICD-10-CM

## 2022-05-30 NOTE — Telephone Encounter (Signed)
Last INR 05/01/2022 Last OV 02/19/2022

## 2022-06-05 ENCOUNTER — Ambulatory Visit: Payer: Medicare HMO

## 2022-06-06 ENCOUNTER — Ambulatory Visit: Payer: Medicare HMO | Attending: Cardiovascular Disease

## 2022-06-06 DIAGNOSIS — I4819 Other persistent atrial fibrillation: Secondary | ICD-10-CM | POA: Diagnosis not present

## 2022-06-06 DIAGNOSIS — Z7901 Long term (current) use of anticoagulants: Secondary | ICD-10-CM | POA: Diagnosis not present

## 2022-06-06 DIAGNOSIS — Z5181 Encounter for therapeutic drug level monitoring: Secondary | ICD-10-CM

## 2022-06-06 LAB — POCT INR: INR: 2.3 (ref 2.0–3.0)

## 2022-06-06 NOTE — Patient Instructions (Signed)
Continue taking Warfarin 1/2 tablet daily except for 1 tablet on Monday and Friday. Recheck INR in 6 weeks.  Stay consistent with Ensure (1 per week).  C[facility]madin Clinic [phone]

## 2022-07-16 DIAGNOSIS — E1169 Type 2 diabetes mellitus with other specified complication: Secondary | ICD-10-CM | POA: Diagnosis not present

## 2022-07-16 DIAGNOSIS — E785 Hyperlipidemia, unspecified: Secondary | ICD-10-CM | POA: Diagnosis not present

## 2022-07-16 DIAGNOSIS — I10 Essential (primary) hypertension: Secondary | ICD-10-CM | POA: Diagnosis not present

## 2022-07-16 DIAGNOSIS — I4891 Unspecified atrial fibrillation: Secondary | ICD-10-CM | POA: Diagnosis not present

## 2022-07-16 DIAGNOSIS — D649 Anemia, unspecified: Secondary | ICD-10-CM | POA: Diagnosis not present

## 2022-07-16 DIAGNOSIS — I5032 Chronic diastolic (congestive) heart failure: Secondary | ICD-10-CM | POA: Diagnosis not present

## 2022-07-16 DIAGNOSIS — N1831 Chronic kidney disease, stage 3a: Secondary | ICD-10-CM | POA: Diagnosis not present

## 2022-07-16 DIAGNOSIS — E039 Hypothyroidism, unspecified: Secondary | ICD-10-CM | POA: Diagnosis not present

## 2022-07-18 ENCOUNTER — Ambulatory Visit: Payer: Medicare HMO | Attending: Cardiovascular Disease

## 2022-07-18 DIAGNOSIS — I4819 Other persistent atrial fibrillation: Secondary | ICD-10-CM

## 2022-07-18 DIAGNOSIS — Z5181 Encounter for therapeutic drug level monitoring: Secondary | ICD-10-CM | POA: Diagnosis not present

## 2022-07-18 DIAGNOSIS — Z7901 Long term (current) use of anticoagulants: Secondary | ICD-10-CM | POA: Diagnosis not present

## 2022-07-18 LAB — POCT INR: INR: 4.2 — AB (ref 2.0–3.0)

## 2022-07-18 NOTE — Patient Instructions (Signed)
HOLD SATURDAY AND SUNDAY ONLY THEN Continue taking Warfarin 1/2 tablet daily except for 1 tablet on Monday and Friday. Recheck INR in 2 weeks.  Stay consistent with Ensure (1 per week).  [facility] Clinic [phone]

## 2022-08-01 ENCOUNTER — Ambulatory Visit: Payer: Medicare HMO | Attending: Cardiovascular Disease

## 2022-08-01 DIAGNOSIS — Z7901 Long term (current) use of anticoagulants: Secondary | ICD-10-CM | POA: Diagnosis not present

## 2022-08-01 DIAGNOSIS — I4819 Other persistent atrial fibrillation: Secondary | ICD-10-CM | POA: Diagnosis not present

## 2022-08-01 DIAGNOSIS — Z5181 Encounter for therapeutic drug level monitoring: Secondary | ICD-10-CM

## 2022-08-01 LAB — POCT INR: INR: 3.1 — AB (ref 2.0–3.0)

## 2022-08-01 NOTE — Patient Instructions (Addendum)
Description   Only take 1/2 tablet today and then continue taking Warfarin 1/2 tablet daily except for 1 tablet on Monday and Friday.  Recheck INR in 3 weeks.  Stay consistent with Ensure (1 per week).  [facility]madin Clinic [phone]

## 2022-08-17 ENCOUNTER — Emergency Department (HOSPITAL_COMMUNITY): Payer: Medicare HMO

## 2022-08-17 ENCOUNTER — Other Ambulatory Visit: Payer: Self-pay

## 2022-08-17 ENCOUNTER — Inpatient Hospital Stay (HOSPITAL_COMMUNITY)
Admission: EM | Admit: 2022-08-17 | Discharge: 2022-09-22 | DRG: 871 | Disposition: E | Payer: Medicare HMO | Attending: Internal Medicine | Admitting: Internal Medicine

## 2022-08-17 ENCOUNTER — Encounter (HOSPITAL_COMMUNITY): Payer: Self-pay | Admitting: Emergency Medicine

## 2022-08-17 DIAGNOSIS — E559 Vitamin D deficiency, unspecified: Secondary | ICD-10-CM | POA: Diagnosis present

## 2022-08-17 DIAGNOSIS — S79922A Unspecified injury of left thigh, initial encounter: Secondary | ICD-10-CM | POA: Diagnosis not present

## 2022-08-17 DIAGNOSIS — J9811 Atelectasis: Secondary | ICD-10-CM | POA: Diagnosis present

## 2022-08-17 DIAGNOSIS — R001 Bradycardia, unspecified: Secondary | ICD-10-CM | POA: Diagnosis not present

## 2022-08-17 DIAGNOSIS — R0902 Hypoxemia: Secondary | ICD-10-CM | POA: Diagnosis not present

## 2022-08-17 DIAGNOSIS — S72051A Unspecified fracture of head of right femur, initial encounter for closed fracture: Secondary | ICD-10-CM | POA: Diagnosis not present

## 2022-08-17 DIAGNOSIS — D631 Anemia in chronic kidney disease: Secondary | ICD-10-CM | POA: Diagnosis present

## 2022-08-17 DIAGNOSIS — S72001A Fracture of unspecified part of neck of right femur, initial encounter for closed fracture: Secondary | ICD-10-CM | POA: Diagnosis not present

## 2022-08-17 DIAGNOSIS — K922 Gastrointestinal hemorrhage, unspecified: Secondary | ICD-10-CM | POA: Diagnosis present

## 2022-08-17 DIAGNOSIS — N1831 Chronic kidney disease, stage 3a: Secondary | ICD-10-CM | POA: Diagnosis present

## 2022-08-17 DIAGNOSIS — R188 Other ascites: Secondary | ICD-10-CM | POA: Diagnosis present

## 2022-08-17 DIAGNOSIS — I482 Chronic atrial fibrillation, unspecified: Secondary | ICD-10-CM | POA: Diagnosis not present

## 2022-08-17 DIAGNOSIS — I2510 Atherosclerotic heart disease of native coronary artery without angina pectoris: Secondary | ICD-10-CM | POA: Diagnosis present

## 2022-08-17 DIAGNOSIS — T45515A Adverse effect of anticoagulants, initial encounter: Secondary | ICD-10-CM | POA: Diagnosis present

## 2022-08-17 DIAGNOSIS — R1310 Dysphagia, unspecified: Secondary | ICD-10-CM | POA: Diagnosis not present

## 2022-08-17 DIAGNOSIS — G9341 Metabolic encephalopathy: Secondary | ICD-10-CM | POA: Diagnosis present

## 2022-08-17 DIAGNOSIS — R809 Proteinuria, unspecified: Secondary | ICD-10-CM | POA: Diagnosis present

## 2022-08-17 DIAGNOSIS — Z8249 Family history of ischemic heart disease and other diseases of the circulatory system: Secondary | ICD-10-CM

## 2022-08-17 DIAGNOSIS — I10 Essential (primary) hypertension: Secondary | ICD-10-CM | POA: Diagnosis present

## 2022-08-17 DIAGNOSIS — J9601 Acute respiratory failure with hypoxia: Secondary | ICD-10-CM

## 2022-08-17 DIAGNOSIS — E872 Acidosis, unspecified: Secondary | ICD-10-CM | POA: Diagnosis present

## 2022-08-17 DIAGNOSIS — K5900 Constipation, unspecified: Secondary | ICD-10-CM | POA: Diagnosis present

## 2022-08-17 DIAGNOSIS — W19XXXA Unspecified fall, initial encounter: Secondary | ICD-10-CM

## 2022-08-17 DIAGNOSIS — I959 Hypotension, unspecified: Secondary | ICD-10-CM | POA: Diagnosis not present

## 2022-08-17 DIAGNOSIS — Z79890 Hormone replacement therapy (postmenopausal): Secondary | ICD-10-CM

## 2022-08-17 DIAGNOSIS — S7291XA Unspecified fracture of right femur, initial encounter for closed fracture: Secondary | ICD-10-CM | POA: Diagnosis not present

## 2022-08-17 DIAGNOSIS — I130 Hypertensive heart and chronic kidney disease with heart failure and stage 1 through stage 4 chronic kidney disease, or unspecified chronic kidney disease: Secondary | ICD-10-CM | POA: Diagnosis present

## 2022-08-17 DIAGNOSIS — Z01810 Encounter for preprocedural cardiovascular examination: Secondary | ICD-10-CM | POA: Diagnosis not present

## 2022-08-17 DIAGNOSIS — I4819 Other persistent atrial fibrillation: Secondary | ICD-10-CM | POA: Diagnosis present

## 2022-08-17 DIAGNOSIS — Z87442 Personal history of urinary calculi: Secondary | ICD-10-CM

## 2022-08-17 DIAGNOSIS — Y92009 Unspecified place in unspecified non-institutional (private) residence as the place of occurrence of the external cause: Secondary | ICD-10-CM

## 2022-08-17 DIAGNOSIS — I4891 Unspecified atrial fibrillation: Secondary | ICD-10-CM | POA: Diagnosis not present

## 2022-08-17 DIAGNOSIS — Z7901 Long term (current) use of anticoagulants: Secondary | ICD-10-CM

## 2022-08-17 DIAGNOSIS — Z96641 Presence of right artificial hip joint: Secondary | ICD-10-CM | POA: Diagnosis present

## 2022-08-17 DIAGNOSIS — Z7984 Long term (current) use of oral hypoglycemic drugs: Secondary | ICD-10-CM

## 2022-08-17 DIAGNOSIS — R791 Abnormal coagulation profile: Secondary | ICD-10-CM | POA: Diagnosis not present

## 2022-08-17 DIAGNOSIS — J470 Bronchiectasis with acute lower respiratory infection: Secondary | ICD-10-CM | POA: Diagnosis present

## 2022-08-17 DIAGNOSIS — N179 Acute kidney failure, unspecified: Secondary | ICD-10-CM

## 2022-08-17 DIAGNOSIS — Z888 Allergy status to other drugs, medicaments and biological substances status: Secondary | ICD-10-CM

## 2022-08-17 DIAGNOSIS — J90 Pleural effusion, not elsewhere classified: Secondary | ICD-10-CM | POA: Diagnosis not present

## 2022-08-17 DIAGNOSIS — A419 Sepsis, unspecified organism: Principal | ICD-10-CM | POA: Diagnosis present

## 2022-08-17 DIAGNOSIS — K6289 Other specified diseases of anus and rectum: Secondary | ICD-10-CM | POA: Diagnosis present

## 2022-08-17 DIAGNOSIS — I700 Atherosclerosis of aorta: Secondary | ICD-10-CM | POA: Diagnosis present

## 2022-08-17 DIAGNOSIS — I2582 Chronic total occlusion of coronary artery: Secondary | ICD-10-CM | POA: Diagnosis present

## 2022-08-17 DIAGNOSIS — Z833 Family history of diabetes mellitus: Secondary | ICD-10-CM

## 2022-08-17 DIAGNOSIS — K920 Hematemesis: Secondary | ICD-10-CM

## 2022-08-17 DIAGNOSIS — M25562 Pain in left knee: Secondary | ICD-10-CM | POA: Diagnosis not present

## 2022-08-17 DIAGNOSIS — I4821 Permanent atrial fibrillation: Secondary | ICD-10-CM | POA: Diagnosis present

## 2022-08-17 DIAGNOSIS — I080 Rheumatic disorders of both mitral and aortic valves: Secondary | ICD-10-CM | POA: Diagnosis present

## 2022-08-17 DIAGNOSIS — E1122 Type 2 diabetes mellitus with diabetic chronic kidney disease: Secondary | ICD-10-CM | POA: Diagnosis present

## 2022-08-17 DIAGNOSIS — I5033 Acute on chronic diastolic (congestive) heart failure: Secondary | ICD-10-CM | POA: Diagnosis present

## 2022-08-17 DIAGNOSIS — Z66 Do not resuscitate: Secondary | ICD-10-CM | POA: Diagnosis present

## 2022-08-17 DIAGNOSIS — J690 Pneumonitis due to inhalation of food and vomit: Secondary | ICD-10-CM | POA: Insufficient documentation

## 2022-08-17 DIAGNOSIS — I480 Paroxysmal atrial fibrillation: Secondary | ICD-10-CM | POA: Diagnosis not present

## 2022-08-17 DIAGNOSIS — R0602 Shortness of breath: Secondary | ICD-10-CM | POA: Diagnosis not present

## 2022-08-17 DIAGNOSIS — Z515 Encounter for palliative care: Secondary | ICD-10-CM | POA: Diagnosis not present

## 2022-08-17 DIAGNOSIS — I701 Atherosclerosis of renal artery: Secondary | ICD-10-CM | POA: Diagnosis not present

## 2022-08-17 DIAGNOSIS — I5032 Chronic diastolic (congestive) heart failure: Secondary | ICD-10-CM | POA: Diagnosis present

## 2022-08-17 DIAGNOSIS — R34 Anuria and oliguria: Secondary | ICD-10-CM | POA: Diagnosis not present

## 2022-08-17 DIAGNOSIS — J8410 Pulmonary fibrosis, unspecified: Secondary | ICD-10-CM | POA: Diagnosis present

## 2022-08-17 DIAGNOSIS — I774 Celiac artery compression syndrome: Secondary | ICD-10-CM | POA: Diagnosis not present

## 2022-08-17 DIAGNOSIS — Z7189 Other specified counseling: Secondary | ICD-10-CM | POA: Diagnosis not present

## 2022-08-17 DIAGNOSIS — K551 Chronic vascular disorders of intestine: Secondary | ICD-10-CM | POA: Diagnosis present

## 2022-08-17 DIAGNOSIS — S0990XA Unspecified injury of head, initial encounter: Secondary | ICD-10-CM | POA: Diagnosis not present

## 2022-08-17 DIAGNOSIS — I272 Pulmonary hypertension, unspecified: Secondary | ICD-10-CM | POA: Diagnosis present

## 2022-08-17 DIAGNOSIS — K30 Functional dyspepsia: Secondary | ICD-10-CM | POA: Diagnosis present

## 2022-08-17 DIAGNOSIS — D6832 Hemorrhagic disorder due to extrinsic circulating anticoagulants: Secondary | ICD-10-CM | POA: Diagnosis present

## 2022-08-17 DIAGNOSIS — E039 Hypothyroidism, unspecified: Secondary | ICD-10-CM | POA: Diagnosis present

## 2022-08-17 DIAGNOSIS — I4892 Unspecified atrial flutter: Secondary | ICD-10-CM | POA: Diagnosis present

## 2022-08-17 DIAGNOSIS — Z955 Presence of coronary angioplasty implant and graft: Secondary | ICD-10-CM

## 2022-08-17 DIAGNOSIS — M25551 Pain in right hip: Secondary | ICD-10-CM | POA: Diagnosis present

## 2022-08-17 DIAGNOSIS — R54 Age-related physical debility: Secondary | ICD-10-CM | POA: Diagnosis present

## 2022-08-17 DIAGNOSIS — S72091A Other fracture of head and neck of right femur, initial encounter for closed fracture: Secondary | ICD-10-CM | POA: Diagnosis present

## 2022-08-17 DIAGNOSIS — R000 Tachycardia, unspecified: Secondary | ICD-10-CM | POA: Diagnosis present

## 2022-08-17 DIAGNOSIS — R739 Hyperglycemia, unspecified: Secondary | ICD-10-CM | POA: Diagnosis not present

## 2022-08-17 DIAGNOSIS — S72351A Displaced comminuted fracture of shaft of right femur, initial encounter for closed fracture: Secondary | ICD-10-CM | POA: Diagnosis not present

## 2022-08-17 DIAGNOSIS — I351 Nonrheumatic aortic (valve) insufficiency: Secondary | ICD-10-CM | POA: Diagnosis not present

## 2022-08-17 DIAGNOSIS — K529 Noninfective gastroenteritis and colitis, unspecified: Secondary | ICD-10-CM | POA: Diagnosis present

## 2022-08-17 DIAGNOSIS — R6520 Severe sepsis without septic shock: Secondary | ICD-10-CM | POA: Diagnosis present

## 2022-08-17 DIAGNOSIS — E785 Hyperlipidemia, unspecified: Secondary | ICD-10-CM | POA: Diagnosis present

## 2022-08-17 DIAGNOSIS — I342 Nonrheumatic mitral (valve) stenosis: Secondary | ICD-10-CM | POA: Diagnosis not present

## 2022-08-17 DIAGNOSIS — Z043 Encounter for examination and observation following other accident: Secondary | ICD-10-CM | POA: Diagnosis not present

## 2022-08-17 DIAGNOSIS — Z7982 Long term (current) use of aspirin: Secondary | ICD-10-CM

## 2022-08-17 DIAGNOSIS — Z79899 Other long term (current) drug therapy: Secondary | ICD-10-CM

## 2022-08-17 DIAGNOSIS — I252 Old myocardial infarction: Secondary | ICD-10-CM

## 2022-08-17 LAB — COMPREHENSIVE METABOLIC PANEL
ALT: 15 U/L (ref 0–44)
AST: 39 U/L (ref 15–41)
Albumin: 2.8 g/dL — ABNORMAL LOW (ref 3.5–5.0)
Alkaline Phosphatase: 69 U/L (ref 38–126)
Anion gap: 11 (ref 5–15)
BUN: 23 mg/dL (ref 8–23)
CO2: 19 mmol/L — ABNORMAL LOW (ref 22–32)
Calcium: 8.7 mg/dL — ABNORMAL LOW (ref 8.9–10.3)
Chloride: 107 mmol/L (ref 98–111)
Creatinine, Ser: 1.16 mg/dL — ABNORMAL HIGH (ref 0.44–1.00)
GFR, Estimated: 45 mL/min — ABNORMAL LOW (ref >60)
Glucose, Bld: 245 mg/dL — ABNORMAL HIGH (ref 70–99)
Potassium: 4.2 mmol/L (ref 3.5–5.1)
Sodium: 137 mmol/L (ref 135–145)
Total Bilirubin: 0.9 mg/dL (ref 0.3–1.2)
Total Protein: 6.9 g/dL (ref 6.5–8.1)

## 2022-08-17 LAB — I-STAT CHEM 8, ED
BUN: 24 mg/dL — ABNORMAL HIGH (ref 8–23)
Calcium, Ion: 1.08 mmol/L — ABNORMAL LOW (ref 1.15–1.40)
Chloride: 108 mmol/L (ref 98–111)
Creatinine, Ser: 0.9 mg/dL (ref 0.44–1.00)
Glucose, Bld: 237 mg/dL — ABNORMAL HIGH (ref 70–99)
HCT: 33 % — ABNORMAL LOW (ref 36.0–46.0)
Hemoglobin: 11.2 g/dL — ABNORMAL LOW (ref 12.0–15.0)
Potassium: 4.2 mmol/L (ref 3.5–5.1)
Sodium: 139 mmol/L (ref 135–145)
TCO2: 18 mmol/L — ABNORMAL LOW (ref 22–32)

## 2022-08-17 LAB — CBC
HCT: 32 % — ABNORMAL LOW (ref 36.0–46.0)
Hemoglobin: 10.8 g/dL — ABNORMAL LOW (ref 12.0–15.0)
MCH: 30.8 pg (ref 26.0–34.0)
MCHC: 33.8 g/dL (ref 30.0–36.0)
MCV: 91.2 fL (ref 80.0–100.0)
Platelets: 214 K/uL (ref 150–400)
RBC: 3.51 MIL/uL — ABNORMAL LOW (ref 3.87–5.11)
RDW: 13.4 % (ref 11.5–15.5)
WBC: 10.6 K/uL — ABNORMAL HIGH (ref 4.0–10.5)
nRBC: 0 % (ref 0.0–0.2)

## 2022-08-17 LAB — CK: Total CK: 379 U/L — ABNORMAL HIGH (ref 38–234)

## 2022-08-17 LAB — LACTIC ACID, PLASMA: Lactic Acid, Venous: 4.2 mmol/L (ref 0.5–1.9)

## 2022-08-17 LAB — PROTIME-INR
INR: 3 — ABNORMAL HIGH (ref 0.8–1.2)
Prothrombin Time: 30.8 seconds — ABNORMAL HIGH (ref 11.4–15.2)

## 2022-08-17 LAB — ETHANOL: Alcohol, Ethyl (B): <10 mg/dL

## 2022-08-17 LAB — TROPONIN I (HIGH SENSITIVITY): Troponin I (High Sensitivity): 16 ng/L

## 2022-08-17 MED ORDER — FENTANYL CITRATE PF 50 MCG/ML IJ SOSY
25.0000 ug | PREFILLED_SYRINGE | Freq: Once | INTRAMUSCULAR | Status: AC
  Administered 2022-08-17: 25 ug via INTRAVENOUS
  Filled 2022-08-17: qty 1

## 2022-08-17 MED ORDER — ONDANSETRON HCL 4 MG/2ML IJ SOLN
4.0000 mg | Freq: Once | INTRAMUSCULAR | Status: AC
  Administered 2022-08-17: 4 mg via INTRAVENOUS
  Filled 2022-08-17: qty 2

## 2022-08-17 MED ORDER — LACTATED RINGERS IV BOLUS
1000.0000 mL | Freq: Once | INTRAVENOUS | Status: AC
  Administered 2022-08-17: 1000 mL via INTRAVENOUS

## 2022-08-17 NOTE — ED Notes (Signed)
Patient brought in by [facility] from home. Patient fell between 2000 last night and 2200 tonight. Patient reports standing at the stove and falling and unable to get back up. Patient arrives with c-collar in placed via EMS. Skin tear to the left arm. Patient reports hitting head on stove when she fell. Complaining of nausea on arrival.  Lives at home by herself, son checks in on the patient, found her on the floor this evening when she wouldn't answer the phone.  20G LFA.

## 2022-08-17 NOTE — ED Notes (Signed)
The pts son is at the bedside  the pt  remains  alert and oriented to self and she can share details of the fall  abrasions  to the lt arm in three areas.  The pt has vomited this am and reamins  nauseated.  She came in with dark green stool large amount  on her   she has been cleaned  clean lenin and pure wick placed

## 2022-08-17 NOTE — Progress Notes (Signed)
   08/14/2022 2310  Spiritual Encounters  Type of Visit Initial  Care provided to: Family  Conversation partners present during encounter Nurse  Referral source Trauma page  Reason for visit Code  OnCall Visit Yes  Spiritual Framework  Community/Connection Family  Patient Stress Factors None identified  Family Stress Factors None identified  Interventions  Spiritual Care Interventions Made Compassionate presence   Patient unavailable as medical team attending to immediate needs. Waited for family to arrive. Son, Ana [name] arrived in waiting room. Advise status per nurse, doctor and/or staff will alert him once CT complete. Son wanted to continue waiting in waiting area. Declined chaplain assistance.

## 2022-08-17 NOTE — ED Provider Notes (Incomplete)
[facility] Provider Note   CSN: [id number] Arrival date & time: 08/01/2022  2237     History {Add pertinent medical, surgical, social history, OB history to HPI:1} No chief complaint on file.   Ana [name] is a 89 y.o. female lives alone, brought in by EMS after being found down at home.  Family last talked to the patient at 8:30 PM on 2/24, unable to hold her since that time.  Patient states she fell around 1:00 in the morning although she states that she was at the stove cooking something when she fell.  Downtime nearly 20 hours based on this timeline.  Patient states she was unable to get up on her own.  Pain bilateral hips right greater than left pain in the right knee.  Patient endorses hitting her head on the stove on her way down, anticoagulated on Coumadin for A-fib.  Denies LOC nausea vomiting blurry double vision at home but does endorse onset of nausea on her way to the emergency department with EMS.  In addition to the above history of hypertension CHF and aortic insufficiency, pulmonary hypertension and hypothyroidism.  HPI     Home Medications Prior to Admission medications   Medication Sig Start Date End Date Taking? Authorizing Provider  acetaminophen (TYLENOL) 500 MG tablet Take 500 mg by mouth every 8 (eight) hours as needed for moderate pain or headache.     [provider]  amLODipine (NORVASC) 5 MG tablet TAKE 1 TABLET BY MOUTH ONCE DAILY. MUST  KEEP  UPCOMING  APPOINTMENT  IN  AUGUST  2023  FOR  ADDITIONAL  REFILLS 03/05/22   Sueanne Margarita, MD  aspirin EC 81 MG tablet Take 81 mg by mouth daily.    [provider]  Cholecalciferol (VITAMIN D3) 2000 UNITS TABS Take 2,000 Units by mouth daily.     [provider]  Coenzyme Q10 200 MG capsule Take 200 mg by mouth daily.    [provider]  Ferrous Sulfate (SLOW FE PO) Take 1 tablet by mouth daily.    [provider]   levothyroxine (SYNTHROID) 50 MCG tablet Take 50 mcg by mouth daily. 12/27/21   [provider]  losartan (COZAAR) 100 MG tablet Take 1 tablet by mouth once daily 03/04/22   Sueanne Margarita, MD  metFORMIN (GLUCOPHAGE) 1000 MG tablet Take 1 tablet (1,000 mg total) by mouth 2 (two) times daily with a meal. 02/16/14   Turner, Eber Hong, MD  Omega-3 Krill Oil 500 MG CAPS Take 500 mg by mouth daily.     [provider]  phenylephrine-shark liver oil-mineral oil-petrolatum (PREPARATION H) 0.25-3-14-71.9 % rectal ointment Place 1 application rectally 2 (two) times daily as needed for hemorrhoids.    [provider]  polyethylene glycol (MIRALAX / GLYCOLAX) packet Take 17 g by mouth daily as needed for moderate constipation.     [provider]  pravastatin (PRAVACHOL) 40 MG tablet TAKE 1 TABLET BY MOUTH ONCE DAILY IN THE EVENING 03/04/22   Sueanne Margarita, MD  vitamin B-12 (CYANOCOBALAMIN) 1000 MCG tablet Take 1,000 mcg by mouth daily.    [provider]  warfarin (COUMADIN) 2.5 MG tablet Take 1/2 tablet daily except 1 tablet on Monday and Friday or as directed by Anticoagulation Clinic. 05/30/22   Sueanne Margarita, MD      Allergies    Welchol Melvyn Neth hcl], Zetia [ezetimibe], Crestor [rosuvastatin], Statins, and Zocor [simvastatin]  Review of Systems   Review of Systems  Unable to perform ROS: Acuity of condition    Physical Exam Updated Vital Signs BP 120/88   Resp 18   Ht '5\' 2"'$  (1.575 m)   Wt 52.2 kg   BMI 21.03 kg/m  Physical Exam Vitals and nursing note reviewed. Exam conducted with a chaperone present (ed [employer]).  Constitutional:      Appearance: She is not toxic-appearing.  HENT:     Head: Normocephalic.     Mouth/Throat:     Mouth: Mucous membranes are moist.     Pharynx: No oropharyngeal exudate or posterior oropharyngeal erythema.  Eyes:     General: Vision grossly intact. No scleral icterus.       Right eye: No discharge.         Left eye: No discharge.     Extraocular Movements: Extraocular movements intact.     Conjunctiva/sclera: Conjunctivae normal.     Pupils: Pupils are equal, round, and reactive to light.   Neck:     Trachea: Trachea and phonation normal.  Cardiovascular:     Rate and Rhythm: Normal rate and regular rhythm.     Pulses:          Dorsalis pedis pulses are 1+ on the right side and 1+ on the left side.  Pulmonary:     Effort: Pulmonary effort is normal. No tachypnea, bradypnea, accessory muscle usage, prolonged expiration or respiratory distress.     Breath sounds: Normal breath sounds. No wheezing or rales.  Chest:     Chest wall: No mass, lacerations, deformity, swelling, tenderness, crepitus or edema.  Abdominal:     General: Bowel sounds are normal. There is no distension.     Palpations: Abdomen is soft.     Tenderness: There is no abdominal tenderness. There is no right CVA tenderness, left CVA tenderness, guarding or rebound.  Musculoskeletal:        General: No deformity.       Arms:     Cervical back: Normal range of motion and neck supple.     Right lower leg: 1+ Edema present.     Left lower leg: 1+ Edema present.       Legs:  Lymphadenopathy:     Cervical: No cervical adenopathy.  Skin:    General: Skin is warm and dry.     Capillary Refill: Capillary refill takes less than 2 seconds.  Neurological:     General: No focal deficit present.     Mental Status: She is alert. Mental status is at baseline.  Psychiatric:        Mood and Affect: Mood normal.     ED Results / Procedures / Treatments   Labs (all labs ordered are listed, but only abnormal results are displayed) Labs Reviewed  CBC  ETHANOL  URINALYSIS, ROUTINE W REFLEX MICROSCOPIC  LACTIC ACID, PLASMA  PROTIME-INR  CK TOTAL AND CKMB (NOT AT [facility])  COMPREHENSIVE METABOLIC PANEL  I-STAT CHEM 8, ED    EKG None  Radiology No results found.  Procedures .Critical Care  Performed by: Emeline Darling, PA-C Authorized by: Emeline Darling, PA-C   Critical care provider statement:    Critical care time (minutes):  45   Critical care was time spent personally by me on the following activities:  Development of treatment plan with patient or surrogate, discussions with consultants, evaluation of patient's response to treatment, examination of patient, obtaining history from patient or  surrogate, ordering and performing treatments and interventions, ordering and review of laboratory studies, ordering and review of radiographic studies, pulse oximetry and re-evaluation of patient's condition   {Document cardiac monitor, telemetry assessment procedure when appropriate:1}  Medications Ordered in ED Medications - No data to display  ED Course/ Medical Decision Making/ A&P   {   Click here for ABCD2, HEART and other calculatorsREFRESH Note before signing :1}                          Medical Decision Making Amount and/or Complexity of Data Reviewed Labs: ordered.   ***  {Document critical care time when appropriate:1} {Document review of labs and clinical decision tools ie heart score, Chads2Vasc2 etc:1}  {Document your independent review of radiology images, and any outside records:1} {Document your discussion with family members, caretakers, and with consultants:1} {Document social determinants of health affecting pt's care:1} {Document your decision making why or why not admission, treatments were needed:1} Final Clinical Impression(s) / ED Diagnoses Final diagnoses:  None    Rx / DC Orders ED Discharge Orders     None

## 2022-08-18 ENCOUNTER — Emergency Department (HOSPITAL_COMMUNITY): Payer: Medicare HMO

## 2022-08-18 ENCOUNTER — Inpatient Hospital Stay (HOSPITAL_COMMUNITY): Payer: Medicare HMO

## 2022-08-18 ENCOUNTER — Encounter (HOSPITAL_COMMUNITY): Payer: Self-pay | Admitting: Anesthesiology

## 2022-08-18 ENCOUNTER — Other Ambulatory Visit: Payer: Self-pay | Admitting: Home Health

## 2022-08-18 DIAGNOSIS — I130 Hypertensive heart and chronic kidney disease with heart failure and stage 1 through stage 4 chronic kidney disease, or unspecified chronic kidney disease: Secondary | ICD-10-CM | POA: Diagnosis present

## 2022-08-18 DIAGNOSIS — Z01810 Encounter for preprocedural cardiovascular examination: Secondary | ICD-10-CM | POA: Diagnosis not present

## 2022-08-18 DIAGNOSIS — I482 Chronic atrial fibrillation, unspecified: Secondary | ICD-10-CM | POA: Diagnosis not present

## 2022-08-18 DIAGNOSIS — S72051A Unspecified fracture of head of right femur, initial encounter for closed fracture: Secondary | ICD-10-CM

## 2022-08-18 DIAGNOSIS — Z515 Encounter for palliative care: Secondary | ICD-10-CM | POA: Diagnosis not present

## 2022-08-18 DIAGNOSIS — I4819 Other persistent atrial fibrillation: Secondary | ICD-10-CM | POA: Diagnosis not present

## 2022-08-18 DIAGNOSIS — K551 Chronic vascular disorders of intestine: Secondary | ICD-10-CM | POA: Diagnosis present

## 2022-08-18 DIAGNOSIS — I480 Paroxysmal atrial fibrillation: Secondary | ICD-10-CM

## 2022-08-18 DIAGNOSIS — I10 Essential (primary) hypertension: Secondary | ICD-10-CM | POA: Diagnosis not present

## 2022-08-18 DIAGNOSIS — E039 Hypothyroidism, unspecified: Secondary | ICD-10-CM | POA: Diagnosis present

## 2022-08-18 DIAGNOSIS — W19XXXA Unspecified fall, initial encounter: Secondary | ICD-10-CM | POA: Diagnosis present

## 2022-08-18 DIAGNOSIS — I4892 Unspecified atrial flutter: Secondary | ICD-10-CM | POA: Diagnosis present

## 2022-08-18 DIAGNOSIS — D6832 Hemorrhagic disorder due to extrinsic circulating anticoagulants: Secondary | ICD-10-CM | POA: Diagnosis present

## 2022-08-18 DIAGNOSIS — M25551 Pain in right hip: Secondary | ICD-10-CM | POA: Diagnosis present

## 2022-08-18 DIAGNOSIS — E1122 Type 2 diabetes mellitus with diabetic chronic kidney disease: Secondary | ICD-10-CM | POA: Diagnosis present

## 2022-08-18 DIAGNOSIS — K922 Gastrointestinal hemorrhage, unspecified: Secondary | ICD-10-CM

## 2022-08-18 DIAGNOSIS — Z66 Do not resuscitate: Secondary | ICD-10-CM | POA: Diagnosis not present

## 2022-08-18 DIAGNOSIS — Z7189 Other specified counseling: Secondary | ICD-10-CM | POA: Diagnosis not present

## 2022-08-18 DIAGNOSIS — G9341 Metabolic encephalopathy: Secondary | ICD-10-CM | POA: Diagnosis present

## 2022-08-18 DIAGNOSIS — I272 Pulmonary hypertension, unspecified: Secondary | ICD-10-CM | POA: Diagnosis present

## 2022-08-18 DIAGNOSIS — R791 Abnormal coagulation profile: Secondary | ICD-10-CM | POA: Diagnosis not present

## 2022-08-18 DIAGNOSIS — I774 Celiac artery compression syndrome: Secondary | ICD-10-CM | POA: Diagnosis not present

## 2022-08-18 DIAGNOSIS — E872 Acidosis, unspecified: Secondary | ICD-10-CM | POA: Diagnosis present

## 2022-08-18 DIAGNOSIS — I2510 Atherosclerotic heart disease of native coronary artery without angina pectoris: Secondary | ICD-10-CM | POA: Diagnosis not present

## 2022-08-18 DIAGNOSIS — J9601 Acute respiratory failure with hypoxia: Secondary | ICD-10-CM | POA: Diagnosis not present

## 2022-08-18 DIAGNOSIS — A419 Sepsis, unspecified organism: Secondary | ICD-10-CM | POA: Diagnosis present

## 2022-08-18 DIAGNOSIS — S72001A Fracture of unspecified part of neck of right femur, initial encounter for closed fracture: Secondary | ICD-10-CM | POA: Diagnosis not present

## 2022-08-18 DIAGNOSIS — N1831 Chronic kidney disease, stage 3a: Secondary | ICD-10-CM | POA: Diagnosis present

## 2022-08-18 DIAGNOSIS — Y92009 Unspecified place in unspecified non-institutional (private) residence as the place of occurrence of the external cause: Secondary | ICD-10-CM

## 2022-08-18 DIAGNOSIS — R0602 Shortness of breath: Secondary | ICD-10-CM | POA: Diagnosis not present

## 2022-08-18 DIAGNOSIS — M25562 Pain in left knee: Secondary | ICD-10-CM | POA: Diagnosis not present

## 2022-08-18 DIAGNOSIS — R0902 Hypoxemia: Secondary | ICD-10-CM | POA: Diagnosis not present

## 2022-08-18 DIAGNOSIS — I4821 Permanent atrial fibrillation: Secondary | ICD-10-CM | POA: Diagnosis present

## 2022-08-18 DIAGNOSIS — J470 Bronchiectasis with acute lower respiratory infection: Secondary | ICD-10-CM | POA: Diagnosis present

## 2022-08-18 DIAGNOSIS — I700 Atherosclerosis of aorta: Secondary | ICD-10-CM | POA: Diagnosis not present

## 2022-08-18 DIAGNOSIS — R34 Anuria and oliguria: Secondary | ICD-10-CM | POA: Diagnosis not present

## 2022-08-18 DIAGNOSIS — I342 Nonrheumatic mitral (valve) stenosis: Secondary | ICD-10-CM | POA: Diagnosis not present

## 2022-08-18 DIAGNOSIS — J90 Pleural effusion, not elsewhere classified: Secondary | ICD-10-CM | POA: Diagnosis not present

## 2022-08-18 DIAGNOSIS — I351 Nonrheumatic aortic (valve) insufficiency: Secondary | ICD-10-CM | POA: Diagnosis not present

## 2022-08-18 DIAGNOSIS — J9811 Atelectasis: Secondary | ICD-10-CM | POA: Diagnosis present

## 2022-08-18 DIAGNOSIS — I701 Atherosclerosis of renal artery: Secondary | ICD-10-CM | POA: Diagnosis not present

## 2022-08-18 DIAGNOSIS — J690 Pneumonitis due to inhalation of food and vomit: Secondary | ICD-10-CM

## 2022-08-18 DIAGNOSIS — S7291XA Unspecified fracture of right femur, initial encounter for closed fracture: Secondary | ICD-10-CM | POA: Diagnosis not present

## 2022-08-18 DIAGNOSIS — K920 Hematemesis: Secondary | ICD-10-CM

## 2022-08-18 DIAGNOSIS — I5033 Acute on chronic diastolic (congestive) heart failure: Secondary | ICD-10-CM | POA: Diagnosis present

## 2022-08-18 DIAGNOSIS — S72091A Other fracture of head and neck of right femur, initial encounter for closed fracture: Secondary | ICD-10-CM | POA: Diagnosis present

## 2022-08-18 DIAGNOSIS — N179 Acute kidney failure, unspecified: Secondary | ICD-10-CM | POA: Diagnosis present

## 2022-08-18 DIAGNOSIS — I5032 Chronic diastolic (congestive) heart failure: Secondary | ICD-10-CM

## 2022-08-18 DIAGNOSIS — D631 Anemia in chronic kidney disease: Secondary | ICD-10-CM | POA: Diagnosis present

## 2022-08-18 DIAGNOSIS — I080 Rheumatic disorders of both mitral and aortic valves: Secondary | ICD-10-CM | POA: Diagnosis present

## 2022-08-18 LAB — HEMOGLOBIN AND HEMATOCRIT, BLOOD
HCT: 28.9 % — ABNORMAL LOW (ref 36.0–46.0)
HCT: 27.6 % — ABNORMAL LOW (ref 36.0–46.0)
HCT: 28.7 % — ABNORMAL LOW (ref 36.0–46.0)
Hemoglobin: 9.5 g/dL — ABNORMAL LOW (ref 12.0–15.0)
Hemoglobin: 9.2 g/dL — ABNORMAL LOW (ref 12.0–15.0)
Hemoglobin: 9.7 g/dL — ABNORMAL LOW (ref 12.0–15.0)

## 2022-08-18 LAB — BRAIN NATRIURETIC PEPTIDE
B Natriuretic Peptide: 688.1 pg/mL — ABNORMAL HIGH (ref 0.0–100.0)
B Natriuretic Peptide: 869 pg/mL — ABNORMAL HIGH (ref 0.0–100.0)

## 2022-08-18 LAB — BASIC METABOLIC PANEL
Anion gap: 11 (ref 5–15)
BUN: 24 mg/dL — ABNORMAL HIGH (ref 8–23)
CO2: 20 mmol/L — ABNORMAL LOW (ref 22–32)
Calcium: 8.4 mg/dL — ABNORMAL LOW (ref 8.9–10.3)
Chloride: 109 mmol/L (ref 98–111)
Creatinine, Ser: 1.03 mg/dL — ABNORMAL HIGH (ref 0.44–1.00)
GFR, Estimated: 52 mL/min — ABNORMAL LOW (ref >60)
Glucose, Bld: 126 mg/dL — ABNORMAL HIGH (ref 70–99)
Potassium: 4.3 mmol/L (ref 3.5–5.1)
Sodium: 140 mmol/L (ref 135–145)

## 2022-08-18 LAB — SURGICAL PCR SCREEN
MRSA, PCR: NEGATIVE
Staphylococcus aureus: NEGATIVE

## 2022-08-18 LAB — LACTIC ACID, PLASMA
Lactic Acid, Venous: 3.8 mmol/L (ref 0.5–1.9)
Lactic Acid, Venous: 2.1 mmol/L (ref 0.5–1.9)
Lactic Acid, Venous: 1.6 mmol/L (ref 0.5–1.9)

## 2022-08-18 LAB — GLUCOSE, CAPILLARY
Glucose-Capillary: 116 mg/dL — ABNORMAL HIGH (ref 70–99)
Glucose-Capillary: 120 mg/dL — ABNORMAL HIGH (ref 70–99)

## 2022-08-18 LAB — URINALYSIS, ROUTINE W REFLEX MICROSCOPIC
Bilirubin Urine: NEGATIVE
Glucose, UA: NEGATIVE mg/dL
Ketones, ur: NEGATIVE mg/dL
Nitrite: POSITIVE — AB
Protein, ur: >300 mg/dL — AB
Specific Gravity, Urine: 1.02 (ref 1.005–1.030)
pH: 5.5 (ref 5.0–8.0)

## 2022-08-18 LAB — CBC
HCT: 29.8 % — ABNORMAL LOW (ref 36.0–46.0)
Hemoglobin: 9.6 g/dL — ABNORMAL LOW (ref 12.0–15.0)
MCH: 29.5 pg (ref 26.0–34.0)
MCHC: 32.2 g/dL (ref 30.0–36.0)
MCV: 91.7 fL (ref 80.0–100.0)
Platelets: 207 10*3/uL (ref 150–400)
RBC: 3.25 MIL/uL — ABNORMAL LOW (ref 3.87–5.11)
RDW: 13.7 % (ref 11.5–15.5)
WBC: 20.8 10*3/uL — ABNORMAL HIGH (ref 4.0–10.5)
nRBC: 0 % (ref 0.0–0.2)

## 2022-08-18 LAB — URINALYSIS, MICROSCOPIC (REFLEX)
RBC / HPF: >50 RBC/hpf (ref 0–5)
WBC, UA: >50 WBC/hpf (ref 0–5)

## 2022-08-18 LAB — POC OCCULT BLOOD, ED: Fecal Occult Bld: POSITIVE — AB

## 2022-08-18 LAB — PROCALCITONIN: Procalcitonin: 0.15 ng/mL

## 2022-08-18 LAB — CBG MONITORING, ED: Glucose-Capillary: 166 mg/dL — ABNORMAL HIGH (ref 70–99)

## 2022-08-18 LAB — ABO/RH: ABO/RH(D): A NEG

## 2022-08-18 LAB — TROPONIN I (HIGH SENSITIVITY): Troponin I (High Sensitivity): 25 ng/L — ABNORMAL HIGH (ref <18)

## 2022-08-18 MED ORDER — LOSARTAN POTASSIUM 50 MG PO TABS: 100.0000 mg | ORAL_TABLET | Freq: Every day | ORAL | Status: DC

## 2022-08-18 MED ORDER — ACETAMINOPHEN 325 MG PO TABS
650.0000 mg | ORAL_TABLET | Freq: Four times a day (QID) | ORAL | Status: DC | PRN
  Administered 2022-08-20 (×2): 650 mg via ORAL
  Filled 2022-08-18 (×2): qty 2

## 2022-08-18 MED ORDER — IOHEXOL 350 MG/ML SOLN
75.0000 mL | Freq: Once | INTRAVENOUS | Status: AC | PRN
  Administered 2022-08-18: 75 mL via INTRAVENOUS

## 2022-08-18 MED ORDER — SENNOSIDES-DOCUSATE SODIUM 8.6-50 MG PO TABS: 1.0000 | ORAL_TABLET | Freq: Every evening | ORAL | Status: DC | PRN

## 2022-08-18 MED ORDER — SODIUM CHLORIDE 0.9 % IV SOLN: 3.0000 g | Freq: Two times a day (BID) | INTRAVENOUS | Status: DC

## 2022-08-18 MED ORDER — SODIUM CHLORIDE 0.9 % IV SOLN: INTRAVENOUS | Status: DC

## 2022-08-18 MED ORDER — CIPROFLOXACIN IN D5W 200 MG/100ML IV SOLN
200.0000 mg | Freq: Two times a day (BID) | INTRAVENOUS | Status: DC
  Filled 2022-08-18: qty 100

## 2022-08-18 MED ORDER — SODIUM CHLORIDE 0.9 % IV SOLN
3.0000 g | Freq: Two times a day (BID) | INTRAVENOUS | Status: DC
  Administered 2022-08-18 (×2): 3 g via INTRAVENOUS
  Filled 2022-08-18 (×2): qty 8

## 2022-08-18 MED ORDER — INSULIN ASPART 100 UNIT/ML IJ SOLN
0.0000 [IU] | Freq: Four times a day (QID) | INTRAMUSCULAR | Status: DC
  Administered 2022-08-18 – 2022-08-19 (×2): 3 [IU] via SUBCUTANEOUS
  Administered 2022-08-20 (×2): 2 [IU] via SUBCUTANEOUS
  Administered 2022-08-20: 3 [IU] via SUBCUTANEOUS
  Administered 2022-08-22 (×2): 2 [IU] via SUBCUTANEOUS
  Administered 2022-08-22: 3 [IU] via SUBCUTANEOUS
  Administered 2022-08-23: 2 [IU] via SUBCUTANEOUS
  Administered 2022-08-23: 5 [IU] via SUBCUTANEOUS
  Administered 2022-08-23: 2 [IU] via SUBCUTANEOUS
  Administered 2022-08-24: 3 [IU] via SUBCUTANEOUS
  Administered 2022-08-24: 2 [IU] via SUBCUTANEOUS

## 2022-08-18 MED ORDER — ACETAMINOPHEN 650 MG RE SUPP: 650.0000 mg | Freq: Four times a day (QID) | RECTAL | Status: DC | PRN

## 2022-08-18 MED ORDER — PROCHLORPERAZINE EDISYLATE 10 MG/2ML IJ SOLN
5.0000 mg | INTRAMUSCULAR | Status: DC | PRN
  Administered 2022-08-18: 5 mg via INTRAVENOUS
  Filled 2022-08-18: qty 2

## 2022-08-18 MED ORDER — LACTATED RINGERS IV BOLUS
500.0000 mL | Freq: Once | INTRAVENOUS | Status: AC
  Administered 2022-08-18: 500 mL via INTRAVENOUS

## 2022-08-18 MED ORDER — PANTOPRAZOLE SODIUM 40 MG IV SOLR
40.0000 mg | Freq: Two times a day (BID) | INTRAVENOUS | Status: AC
  Administered 2022-08-19 – 2022-08-21 (×6): 40 mg via INTRAVENOUS
  Filled 2022-08-18 (×6): qty 10

## 2022-08-18 MED ORDER — HYDROMORPHONE HCL 1 MG/ML IJ SOLN
0.5000 mg | INTRAMUSCULAR | Status: DC | PRN
  Administered 2022-08-20 – 2022-08-24 (×11): 0.5 mg via INTRAVENOUS
  Filled 2022-08-18 (×6): qty 0.5
  Filled 2022-08-18: qty 1
  Filled 2022-08-18 (×5): qty 0.5

## 2022-08-18 MED ORDER — PANTOPRAZOLE SODIUM 40 MG IV SOLR: 40.0000 mg | Freq: Two times a day (BID) | INTRAVENOUS | Status: DC

## 2022-08-18 MED ORDER — AMLODIPINE BESYLATE 5 MG PO TABS: 5.0000 mg | ORAL_TABLET | Freq: Every day | ORAL | Status: DC

## 2022-08-18 MED ORDER — IPRATROPIUM-ALBUTEROL 0.5-2.5 (3) MG/3ML IN SOLN: 3.0000 mL | RESPIRATORY_TRACT | Status: DC | PRN

## 2022-08-18 MED ORDER — PANTOPRAZOLE 80MG IVPB - SIMPLE MED
80.0000 mg | Freq: Once | INTRAVENOUS | Status: AC
  Administered 2022-08-18: 80 mg via INTRAVENOUS
  Filled 2022-08-18: qty 100

## 2022-08-18 MED ORDER — METOPROLOL TARTRATE 5 MG/5ML IV SOLN: 5.0000 mg | INTRAVENOUS | Status: DC | PRN

## 2022-08-18 MED ORDER — POLYETHYLENE GLYCOL 3350 17 G PO PACK: 17.0000 g | PACK | Freq: Two times a day (BID) | ORAL | Status: DC

## 2022-08-18 MED ORDER — PANTOPRAZOLE INFUSION (NEW) - SIMPLE MED
8.0000 mg/h | INTRAVENOUS | Status: DC
  Administered 2022-08-18 (×3): 8 mg/h via INTRAVENOUS
  Filled 2022-08-18 (×3): qty 100

## 2022-08-18 MED ORDER — FUROSEMIDE 10 MG/ML IJ SOLN: 40.0000 mg | Freq: Once | INTRAMUSCULAR | Status: DC

## 2022-08-18 MED ORDER — SODIUM CHLORIDE 0.9% IV SOLUTION: Freq: Once | INTRAVENOUS | Status: DC

## 2022-08-18 MED ORDER — PANTOPRAZOLE INFUSION (NEW) - SIMPLE MED
8.0000 mg/h | INTRAVENOUS | Status: DC
  Filled 2022-08-18: qty 100

## 2022-08-18 MED ORDER — BISACODYL 10 MG RE SUPP: 10.0000 mg | Freq: Once | RECTAL | Status: DC

## 2022-08-18 MED ORDER — GUAIFENESIN 100 MG/5ML PO LIQD: 5.0000 mL | ORAL | Status: DC | PRN

## 2022-08-18 MED ORDER — METOPROLOL TARTRATE 25 MG PO TABS
25.0000 mg | ORAL_TABLET | Freq: Two times a day (BID) | ORAL | Status: DC
  Administered 2022-08-18 – 2022-08-19 (×4): 25 mg via ORAL
  Filled 2022-08-18 (×4): qty 1

## 2022-08-18 MED ORDER — FUROSEMIDE 10 MG/ML IJ SOLN
40.0000 mg | INTRAMUSCULAR | Status: AC
  Administered 2022-08-19: 40 mg via INTRAVENOUS
  Filled 2022-08-18: qty 4

## 2022-08-18 MED ORDER — VITAMIN K1 10 MG/ML IJ SOLN
5.0000 mg | Freq: Once | INTRAVENOUS | Status: AC
  Administered 2022-08-18: 5 mg via INTRAVENOUS
  Filled 2022-08-18: qty 0.5

## 2022-08-18 MED ORDER — LEVOTHYROXINE SODIUM 50 MCG PO TABS
50.0000 ug | ORAL_TABLET | Freq: Every day | ORAL | Status: DC
  Administered 2022-08-18 – 2022-08-23 (×5): 50 ug via ORAL
  Filled 2022-08-18 (×2): qty 1
  Filled 2022-08-18: qty 2
  Filled 2022-08-18 (×2): qty 1

## 2022-08-18 MED ORDER — METRONIDAZOLE 500 MG/100ML IV SOLN: 500.0000 mg | Freq: Two times a day (BID) | INTRAVENOUS | Status: DC

## 2022-08-18 MED ORDER — FENTANYL CITRATE PF 50 MCG/ML IJ SOSY
25.0000 ug | PREFILLED_SYRINGE | Freq: Once | INTRAMUSCULAR | Status: AC
  Administered 2022-08-18: 25 ug via INTRAVENOUS
  Filled 2022-08-18: qty 1

## 2022-08-18 NOTE — ED Notes (Signed)
The pt is still c/o nausea and some pain also  bruises alongside her  rt hip and lower back   she is also c/o some rt and lt hips  c-collar removed per order of the pa   the pt remembers getting ready to fix her some tea and she fell onto the floor

## 2022-08-18 NOTE — Consult Note (Signed)
Reason for Consult:Right hip fx Referring Physician: Quintella Baton Time called: 0800 Time at bedside: [place] is an 89 y.o. [name].  HPI: Ana [name] was at home making some tea when she fell. She had immediate BLE pain and could not get up. She lay on the floor for Ana couple of days until she was able to get help. She was brought to the ED where x-rays showed Ana right hip fx and orthopedic surgery was consulted. She has also been having coffee ground emesis.  Past Medical History:  Diagnosis Date   Anemia of chronic disease    Aortic insufficiency    moderate by echo 03/2018   Chronic diastolic CHF (congestive heart failure) ([place]) 2014   Diabetes mellitus without complication ([place])    Dizziness 02/13/2015   Hyperlipidemia    Hypertension    Hypothyroidism    MI, old 07/18/2013   Mitral stenosis    mild by echo 03/2018   Permanent atrial fibrillation ([place])    Pulmonary HTN (HCC)    PASP 55mHg 03/2018   Urinary incontinence    Vitamin D deficiency disease     Past Surgical History:  Procedure Laterality Date   CATARACT EXTRACTION     LAPAROSCOPIC APPENDECTOMY N/Ana 04/14/2017   Procedure: APPENDECTOMY LAPAROSCOPIC;  Surgeon: IFanny Skates MD;  Location: [facility];  Service: General;  Laterality: N/Ana;   LEFT HEART CATHETERIZATION WITH CORONARY ANGIOGRAM N/Ana 01/17/2014   Procedure: LEFT HEART CATHETERIZATION WITH CORONARY ANGIOGRAM;  Surgeon: TTroy Sine MD;  [facility];  Service: Cardiovascular;  Laterality: N/Ana;   PERCUTANEOUS CORONARY STENT INTERVENTION (PCI-S)  01/17/2014   Procedure: PERCUTANEOUS CORONARY STENT INTERVENTION (PCI-S);  Surgeon: TTroy Sine MD;  [facility];  Service: Cardiovascular;;   PERCUTANEOUS CORONARY STENT INTERVENTION (PCI-S) N/Ana 01/19/2014   Procedure: PERCUTANEOUS CORONARY STENT INTERVENTION (PCI-S);  Surgeon: DLeonie Man MD;  [facility];  Service: Cardiovascular;  Laterality: N/Ana;    Family History   Problem Relation Age of Onset   Cancer Mother        ent   Cirrhosis Father    Heart attack Sister    Heart disease Sister    Cancer Sister 352      breast cancer   Diabetes Brother    Cancer Other 30       breast    Social History:  reports that she has never smoked. She has never used smokeless tobacco. She reports that she does not drink alcohol and does not use drugs.  Allergies:  Allergies  Allergen Reactions   Welchol [Colesevelam Hcl] Nausea Only   Zetia [Ezetimibe] Other (See Comments)    Edema    Crestor [Rosuvastatin] Other (See Comments)    Unknown   Statins Other (See Comments)    Unknown   Zocor [Simvastatin] Other (See Comments)    Muscle weakness    Medications: I have reviewed the patient's current medications.  Results for orders placed or performed during the hospital encounter of 08/13/2022 (from the past 48 hour(s))  ABO/Rh     Status: None   Collection Time: 08/03/2022 10:43 PM  Result Value Ref Range   ABO/RH(D)      Ana NEG Performed at [facility], [place] [address]  I-Stat Chem 8, ED     Status: Abnormal   Collection Time: 08/18/2022 10:45 PM  Result Value Ref Range   Sodium 139  135 - 145 mmol/L   Potassium 4.2 3.5 - 5.1 mmol/L   Chloride 108 98 - 111 mmol/L   BUN 24 (H) 8 - 23 mg/dL   Creatinine, Ser 0.90 0.44 - 1.00 mg/dL   Glucose, Bld 237 (H) 70 - 99 mg/dL    Comment: Glucose reference range applies only to samples taken after fasting for at least 8 hours.   Calcium, Ion 1.08 (L) 1.15 - 1.40 mmol/L   TCO2 18 (L) 22 - 32 mmol/L   Hemoglobin 11.2 (L) 12.0 - 15.0 g/dL   HCT 33.0 (L) 36.0 - 46.0 %  CBC     Status: Abnormal   Collection Time: 08/16/2022 10:46 PM  Result Value Ref Range   WBC 10.6 (H) 4.0 - 10.5 K/uL   RBC 3.51 (L) 3.87 - 5.11 MIL/uL   Hemoglobin 10.8 (L) 12.0 - 15.0 g/dL   HCT 32.0 (L) 36.0 - 46.0 %   MCV 91.2 80.0 - 100.0 fL   MCH 30.8 26.0 - 34.0 pg   MCHC 33.8 30.0 - 36.0 g/dL   RDW  13.4 11.5 - 15.5 %   Platelets 214 150 - 400 K/uL   nRBC 0.0 0.0 - 0.2 %    Comment: Performed at [facility], [place] [address] [place], [place] [address]  Ethanol     Status: None   Collection Time: 08/01/2022 10:46 PM  Result Value Ref Range   Alcohol, Ethyl (B) <10 <10 mg/dL    Comment: (NOTE) Lowest detectable limit for serum alcohol is 10 mg/dL.  For medical purposes only. Performed at [facility], [place] [address] [place], [place] [address]   Lactic acid, plasma     Status: Abnormal   Collection Time: 08/20/2022 10:46 PM  Result Value Ref Range   Lactic Acid, Venous 4.2 (HH) 0.5 - 1.9 mmol/L    Comment: CRITICAL RESULT CALLED TO, READ BACK BY AND VERIFIED WITH C.CHRISCO,RN. [id number] 07/30/2022. LPAIT Performed at [facility], [place] [address], [place], [place] [address]   Protime-INR     Status: Abnormal   Collection Time: 08/03/2022 10:46 PM  Result Value Ref Range   Prothrombin Time 30.8 (H) 11.4 - 15.2 seconds   INR 3.0 (H) 0.8 - 1.2    Comment: (NOTE) INR goal varies based on device and disease states. Performed at [facility], [place] [address] [place], [place] [address]   Comprehensive metabolic panel     Status: Abnormal   Collection Time: 08/03/2022 10:46 PM  Result Value Ref Range   Sodium 137 135 - 145 mmol/L   Potassium 4.2 3.5 - 5.1 mmol/L   Chloride 107 98 - 111 mmol/L   CO2 19 (L) 22 - 32 mmol/L   Glucose, Bld 245 (H) 70 - 99 mg/dL    Comment: Glucose reference range applies only to samples taken after fasting for at least 8 hours.   BUN 23 8 - 23 mg/dL   Creatinine, Ser 1.16 (H) 0.44 - 1.00 mg/dL   Calcium 8.7 (L) 8.9 - 10.3 mg/dL   Total Protein 6.9 6.5 - 8.1 g/dL   Albumin 2.8 (L) 3.5 - 5.0 g/dL   AST 39 15 - 41 U/L   ALT 15 0 - 44 U/L   Alkaline Phosphatase 69 38 - 126 U/L   Total Bilirubin 0.9 0.3 - 1.2 mg/dL   GFR, Estimated 45 (L) >60 mL/min    Comment: (NOTE) Calculated using the CKD-EPI Creatinine Equation (  2021)    Anion gap  11 5 - 15    Comment: Performed at [facility], [place] [address] [place], [place] [address]  CK     Status: Abnormal   Collection Time: 08/01/2022 10:46 PM  Result Value Ref Range   Total CK 379 (H) 38 - 234 U/L    Comment: Performed at [facility], [place] [address] [place], [place] [address]  Troponin I (High Sensitivity)     Status: None   Collection Time: 08/06/2022 10:46 PM  Result Value Ref Range   Troponin I (High Sensitivity) 16 <18 ng/L    Comment: (NOTE) Elevated high sensitivity troponin I (hsTnI) values and significant  changes across serial measurements may suggest ACS but many other  chronic and acute conditions are known to elevate hsTnI results.  Refer to the "Links" section for chest pain algorithms and additional  guidance. Performed at [facility], [place] [address], [place], [place] [address]   POC occult blood, ED     Status: Abnormal   Collection Time: 08/18/22  2:34 AM  Result Value Ref Range   Fecal Occult Bld POSITIVE (Ana) NEGATIVE  Troponin I (High Sensitivity)     Status: Abnormal   Collection Time: 08/18/22  2:43 AM  Result Value Ref Range   Troponin I (High Sensitivity) 25 (H) <18 ng/L    Comment: (NOTE) Elevated high sensitivity troponin I (hsTnI) values and significant  changes across serial measurements may suggest ACS but many other  chronic and acute conditions are known to elevate hsTnI results.  Refer to the "Links" section for chest pain algorithms and additional  guidance. Performed at [facility], [place] [address], [place], [place] [address]   Lactic acid, plasma     Status: Abnormal   Collection Time: 08/18/22  2:43 AM  Result Value Ref Range   Lactic Acid, Venous 3.8 (HH) 0.5 - 1.9 mmol/L    Comment: CRITICAL VALUE NOTED. VALUE IS CONSISTENT WITH PREVIOUSLY REPORTED/CALLED VALUE Performed at [facility], [place] [address], [place], [place] [address]   Lactic acid, plasma     Status: Abnormal   Collection Time:  08/18/22  6:48 AM  Result Value Ref Range   Lactic Acid, Venous 2.1 (HH) 0.5 - 1.9 mmol/L    Comment: CRITICAL VALUE NOTED. VALUE IS CONSISTENT WITH PREVIOUSLY REPORTED/CALLED VALUE Performed at [facility], [place] [address] [place], [place] [address]   CBG monitoring, ED     Status: Abnormal   Collection Time: 08/18/22  6:51 AM  Result Value Ref Range   Glucose-Capillary 166 (H) 70 - 99 mg/dL    Comment: Glucose reference range applies only to samples taken after fasting for at least 8 hours.  Prepare fresh frozen plasma     Status: None (Preliminary result)   Collection Time: 08/18/22  7:11 AM  Result Value Ref Range   Unit Number [id number]    Blood Component Type THAWED PLASMA    Unit division 00    Status of Unit ALLOCATED    Transfusion Status OK TO TRANSFUSE   Prepare fresh frozen plasma     Status: None (Preliminary result)   Collection Time: 08/18/22  9:00 AM  Result Value Ref Range   Unit Number [place]:[id number]    Blood Component Type THAWED PLASMA    Unit division 00    Status of Unit ALLOCATED    Transfusion Status OK TO TRANSFUSE  CT ANGIO GI BLEED  Result Date: 08/18/2022 CLINICAL DATA:  Coffee ground emesis. Fell yesterday with acute proximal right femoral fracture. EXAM: CTA ABDOMEN AND PELVIS WITHOUT AND WITH CONTRAST TECHNIQUE: Multidetector CT imaging of the abdomen and pelvis was performed using the standard protocol during bolus administration of intravenous contrast. Multiplanar reconstructed images and MIPs were obtained and reviewed to evaluate the vascular anatomy. RADIATION DOSE REDUCTION: This exam was performed according to the departmental dose-optimization program which includes automated exposure control, adjustment of the mA and/or kV according to patient size and/or use of iterative reconstruction technique. CONTRAST:  49m OMNIPAQUE IOHEXOL 350 MG/ML SOLN COMPARISON:  AP pelvis and femoral x-rays yesterday. CT abdomen pelvis with contrast  09/29/17, CTA chest, abdomen and pelvis 01/14/2014. FINDINGS: VASCULAR Aorta: There are moderate calcific plaques progressive from 2015 without aneurysm, dissection or critical stenosis. Celiac: Progress ostial calcific plaques with 75% vessel origin stenosis, otherwise opacifies well but with branch vessel atherosclerosis also seen. SMA: There are calcific plaques proximally with up to 40% stenosis in the proximal 1 cm. There is no other flow-limiting stenosis. No branch occlusion. Renals: Both renal arteries are patent without evidence of aneurysm, dissection, vasculitis, fibromuscular dysplasia or significant stenosis. IMA: Severe calcific origin stenosis. The vessel otherwise opacifies well. No branch occlusion. Inflow: Moderate patchy calcific plaques. No flow-limiting inflow stenosis. Proximal Outflow: There are calcific plaques heaviest in the superficial femoral arteries with at least 60% stenosis in the visualized portions. Veins: Patent. Review of the MIP images confirms the above findings. NON-VASCULAR Lower chest: Mild cardiomegaly. There is calcification in the coronary arteries and mitral ring. Minimal pericardial effusion. There are moderate layering pleural effusions. There is patchy ground-glass disease in the lung bases which could be due to edema or pneumonia . Hepatobiliary: Respiratory motion limits fine detail. There is no obvious liver mass. Gallbladder and bile ducts are unremarkable, as visualized. Pancreas: Atrophic and otherwise unremarkable. Spleen: Unremarkable. Adrenals/Urinary Tract: There is no adrenal mass. No renal mass is seen through the motion artifact. There is urothelial thickening and stranding involving the left renal pelvis and proximal ureter concerning for ascending UTI, with mild thickening of the bladder and perivesical stranding compatible with cystitis. No urinary stone or obstruction is seen. Stomach/Bowel: The stomach distended with air and fluid and has Ana normal wall  thickness. The small bowel is decompressed. There is wall thickening or underdistention in the distal ascending, transverse and descending colon. Large stool impaction in the rectum and possible stercoral proctitis. No contrast extravasation into the stomach or bowel is seen. An appendix is not seen in this patient. Lymphatic: There is body wall anasarca and generalized mesenteric congestive changes. Reproductive: Uterus and bilateral adnexa are unremarkable. Other: Small amount of scattered abdominal and pelvic free ascites. No drainable pocket. No free air. There is no incarcerated hernia. Musculoskeletal: Osteopenia, levoscoliosis and degenerative change lumbar spine. Transverse midcervical proximal right femoral fracture is again noted with impaction and mild cephalad displacement. IMPRESSION: 1. Aortic and branch vessel atherosclerosis. 2. 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 3. No contrast extravasation into the bowel is seen. 4. Moderate pleural effusions with patchy ground-glass disease in the lung bases which could be due to edema or pneumonia. 5. Cardiomegaly with calcific CAD. 6. Body wall anasarca, mesenteric congestion and small amount of scattered abdominal and pelvic free ascites. 7. Probable cystitis and likely left-sided ascending UTI. 8. Large stool impaction in the rectum with possible stercoral proctitis.  9. Colitis versus nondistention of the distal ascending, transverse and descending colon. 10. Distended stomach with air and fluid. No small bowel dilatation. 11. Transverse midcervical proximal right femoral fracture with impaction and mild cephalad displacement. 12. Osteopenia and degenerative change. Aortic Atherosclerosis (ICD10-I70.0). Electronically Signed   By: Telford Nab M.D.   On: 08/18/2022 05:02   DG Femur Portable 1 View Left  Result Date: 08/08/2022 CLINICAL DATA:  Trauma, fall. EXAM: LEFT FEMUR PORTABLE 1  VIEW COMPARISON:  Fall pelvis radiograph earlier today. FINDINGS: Divided frontal views of the left femur obtained. Cortical margins of the left femur are intact. No left femur fracture on this single view. Knee alignment is maintained. The included pubic rami are intact. Prominent vascular calcifications. IMPRESSION: 1. No left femur fracture on divided frontal view. 2. Prominent vascular calcifications. Electronically Signed   By: Keith Rake M.D.   On: 07/26/2022 23:52   CT HEAD WO CONTRAST  Result Date: 08/07/2022 CLINICAL DATA:  Head trauma EXAM: CT HEAD WITHOUT CONTRAST CT CERVICAL SPINE WITHOUT CONTRAST TECHNIQUE: Multidetector CT imaging of the head and cervical spine was performed following the standard protocol without intravenous contrast. Multiplanar CT image reconstructions of the cervical spine were also generated. RADIATION DOSE REDUCTION: This exam was performed according to the departmental dose-optimization program which includes automated exposure control, adjustment of the mA and/or kV according to patient size and/or use of iterative reconstruction technique. COMPARISON:  None Available. FINDINGS: CT HEAD FINDINGS Brain: There is no mass, hemorrhage or extra-axial collection. The size and configuration of the ventricles and extra-axial CSF spaces are normal. There is hypoattenuation of the periventricular white matter, most commonly indicating chronic ischemic microangiopathy. Vascular: Atherosclerotic calcification of the vertebral and internal carotid arteries at the skull base. No abnormal hyperdensity of the major intracranial arteries or dural venous sinuses. Skull: The visualized skull base, calvarium and extracranial soft tissues are normal. Sinuses/Orbits: No fluid levels or advanced mucosal thickening of the visualized paranasal sinuses. No mastoid or middle ear effusion. The orbits are normal. CT CERVICAL SPINE FINDINGS Alignment: No static subluxation. Facets are aligned.  Occipital condyles are normally positioned. Skull base and vertebrae: No acute fracture. Soft tissues and spinal canal: No prevertebral fluid or swelling. No visible canal hematoma. Disc levels: No advanced spinal canal or neural foraminal stenosis. Upper chest: Biapical emphysema Other: Normal visualized paraspinal cervical soft tissues. IMPRESSION: 1. No acute intracranial abnormality. 2. Chronic ischemic microangiopathy. 3. No acute fracture or static subluxation of the cervical spine. Electronically Signed   By: Ulyses Jarred M.D.   On: 07/24/2022 23:30   CT CERVICAL SPINE WO CONTRAST  Result Date: 07/25/2022 CLINICAL DATA:  Head trauma EXAM: CT HEAD WITHOUT CONTRAST CT CERVICAL SPINE WITHOUT CONTRAST TECHNIQUE: Multidetector CT imaging of the head and cervical spine was performed following the standard protocol without intravenous contrast. Multiplanar CT image reconstructions of the cervical spine were also generated. RADIATION DOSE REDUCTION: This exam was performed according to the departmental dose-optimization program which includes automated exposure control, adjustment of the mA and/or kV according to patient size and/or use of iterative reconstruction technique. COMPARISON:  None Available. FINDINGS: CT HEAD FINDINGS Brain: There is no mass, hemorrhage or extra-axial collection. The size and configuration of the ventricles and extra-axial CSF spaces are normal. There is hypoattenuation of the periventricular white matter, most commonly indicating chronic ischemic microangiopathy. Vascular: Atherosclerotic calcification of the vertebral and internal carotid arteries at the skull base. No abnormal hyperdensity of the major intracranial arteries or  dural venous sinuses. Skull: The visualized skull base, calvarium and extracranial soft tissues are normal. Sinuses/Orbits: No fluid levels or advanced mucosal thickening of the visualized paranasal sinuses. No mastoid or middle ear effusion. The orbits are  normal. CT CERVICAL SPINE FINDINGS Alignment: No static subluxation. Facets are aligned. Occipital condyles are normally positioned. Skull base and vertebrae: No acute fracture. Soft tissues and spinal canal: No prevertebral fluid or swelling. No visible canal hematoma. Disc levels: No advanced spinal canal or neural foraminal stenosis. Upper chest: Biapical emphysema Other: Normal visualized paraspinal cervical soft tissues. IMPRESSION: 1. No acute intracranial abnormality. 2. Chronic ischemic microangiopathy. 3. No acute fracture or static subluxation of the cervical spine. Electronically Signed   By: Ulyses Jarred M.D.   On: 07/29/2022 23:30   DG Pelvis Portable  Result Date: 07/29/2022 CLINICAL DATA:  Status post fall. EXAM: PORTABLE PELVIS 1-2 VIEWS COMPARISON:  None Available. FINDINGS: Acute fracture deformity is seen extending through the neck of the proximal right femur. There is no evidence of dislocation. Ana chronic appearing deformity is suspected along the lateral aspect of the left iliac crest. Degenerative changes are seen involving both hips, as well as the visualized portion of the lower lumbar spine. Soft tissue structures are unremarkable. IMPRESSION: Acute fracture of the proximal right femur. Electronically Signed   By: Virgina Norfolk M.D.   On: 07/25/2022 23:13   DG FEMUR PORT, 1V RIGHT  Result Date: 07/24/2022 CLINICAL DATA:  Status post fall. EXAM: RIGHT FEMUR PORTABLE 1 VIEW COMPARISON:  None Available. FINDINGS: An acute fracture deformity is seen extending through the neck of the proximal right femur. Approximately 1/2 shaft width dorsal displacement of the distal fracture site is noted. There is no evidence of dislocation. Moderate severity vascular calcification is noted. IMPRESSION: Acute fracture of the proximal right femur. Electronically Signed   By: Virgina Norfolk M.D.   On: 08/04/2022 23:11   DG Chest Port 1 View  Result Date: 07/28/2022 CLINICAL DATA:  Status post  fall. EXAM: PORTABLE CHEST 1 VIEW COMPARISON:  February 19, 2022 FINDINGS: The cardiac silhouette is mildly enlarged and unchanged in size. There is marked severity calcification of the aortic arch. Diffuse, chronic appearing increased interstitial lung markings are seen. Mild atelectatic changes are present within the bilateral lung bases. There is no evidence of Ana pleural effusion or pneumothorax. Multilevel degenerative changes seen throughout the thoracic spine. IMPRESSION: Chronic appearing increased interstitial lung markings with mild bibasilar atelectasis. Electronically Signed   By: Virgina Norfolk M.D.   On: 08/01/2022 23:09   DG Knee Right Port  Result Date: 08/03/2022 CLINICAL DATA:  Status post fall. EXAM: PORTABLE RIGHT KNEE - 1-2 VIEW COMPARISON:  None Available. FINDINGS: No evidence of acute fracture, dislocation, or joint effusion. There is marked severity patellofemoral and medial tibiofemoral compartment space narrowing. Moderate severity vascular calcification is seen. IMPRESSION: Marked severity degenerative changes without an acute osseous abnormality. Electronically Signed   By: Virgina Norfolk M.D.   On: 08/11/2022 23:07    Review of Systems  HENT:  Negative for ear discharge, ear pain, hearing loss and tinnitus.   Eyes:  Negative for photophobia and pain.  Respiratory:  Negative for cough and shortness of breath.   Cardiovascular:  Negative for chest pain.  Gastrointestinal:  Positive for nausea and vomiting. Negative for abdominal pain.  Genitourinary:  Negative for dysuria, flank pain, frequency and urgency.  Musculoskeletal:  Positive for arthralgias (Bilateral hips/knees). Negative for back pain, myalgias and neck pain.  Neurological:  Negative for dizziness and headaches.  Hematological:  Does not bruise/bleed easily.  Psychiatric/Behavioral:  The patient is not nervous/anxious.    Blood pressure 138/70, pulse (!) 110, temperature 98.2 F (36.8 C), temperature  source Oral, resp. rate 18, height '5\' 2"'$  (1.575 m), weight 52.2 kg, SpO2 90 %. Physical Exam Constitutional:      General: She is not in acute distress.    Appearance: She is well-developed. She is not diaphoretic.  HENT:     Head: Normocephalic and atraumatic.  Eyes:     General: No scleral icterus.       Right eye: No discharge.        Left eye: No discharge.     Conjunctiva/sclera: Conjunctivae normal.  Cardiovascular:     Rate and Rhythm: Normal rate and regular rhythm.  Pulmonary:     Effort: Pulmonary effort is normal. No respiratory distress.  Musculoskeletal:     Cervical back: Normal range of motion.     Comments: RLE No traumatic wounds, ecchymosis, or rash  Mod TTP hip/knee  No knee or ankle effusion  Knee stable to varus/ valgus and anterior/posterior stress  Sens DPN, SPN, TN intact  Motor EHL, ext, flex, evers 5/5  DP 1+, PT 1+, No significant edema  Skin:    General: Skin is warm and dry.  Neurological:     Mental Status: She is alert.  Psychiatric:        Mood and Affect: Mood normal.        Behavior: Behavior normal.     Assessment/Plan: Right hip fx -- Plan THA vs hemi tomorrow with Dr. Marlou Sa. Please keep NPO after MN. Will check INR in AM, may need more vitamin K. Multiple medical problems including diabetes, dizziness, HLD, HTN, hypothyroidism, permanent atrial fibrillation on coumadin and pulmonary hypertension -- per primary service    Lisette Abu, PA-C Orthopedic Surgery [phone] 08/18/2022, 9:22 AM

## 2022-08-18 NOTE — ED Notes (Signed)
The pt sats are not staying up  cool fingers  and a second area not any better.    Nasa; 02  placed at  2 liters  she feels like she needs to have another  bm  so far  she has not had another bm

## 2022-08-18 NOTE — ED Notes (Signed)
MD at bedside. 

## 2022-08-18 NOTE — Progress Notes (Addendum)
Overnight cross coverage:  Received a call from bedside RN regarding the patient having increased work of breathing and increased oxygen requirement, from 2 L nasal cannula up to 15 L NRB.  Not on oxygen supplementation at baseline.  Chest x-ray and ABG ordered.    Presented at bedside.  The patient is alert and oriented x 4.  Increased work of breathing noted on exam.  Speaking in short sentences, 1-2 words at a time, due to dyspnea.  Physical exam notable for diffuse rales bilaterally.    Chest x-ray completed and showing worsening pulmonary infiltrates right greater than left, personally reviewed.  IV Lasix administered 40 mg x 2.    Due to rapid decompensation and rapid decline IV antibiotics escalated to Merrem/Zyvox with better lung parenchyma deposition.  After the aforementioned interventions, returned to see the patient.  She is on BiPAP, with difficulty keeping it on.  Work of breathing improved on BiPAP and after 2 doses of IV Lasix.  Adequate urine output after diuretics but incontinent so was unable to measure.  Updated her son via phone and also at bedside.   Critical care time: 30 minutes.

## 2022-08-18 NOTE — Progress Notes (Signed)
[facility] PROGRESS NOTE    Progress Note  CHAUNTIA RICO  [id number] DOB: 05/18/1932 DOA: 08/20/2022 PCP: London Pepper, MD     Brief Narrative:   Ana [name] is an 89 y.o. female past medical history of diabetes mellitus, essential hypertension hypothyroidism, permanent atrial fibrillation on Coumadin and pulmonary hypertension who family members found her on the floor brought into the ED while in the ED developed coffee-ground emesis, with a supratherapeutic INR of 3, FOBT positive hemoglobin of 11, was complaining of right hip pain x-ray showed acute fracture of the right proximal femur, x-ray of the knee showed degenerative changes, CT of the head showed no acute findings chronic ischemic microangiopathy, C-spine showed no fracture or dislocation   Assessment/Plan:   Possible aspiration pneumonia: Lungs crackles at bases bilaterally more predominantly on the right confusion and coffee-ground emesis she was started on IV antibiotics discontinue Cipro started on IV Unasyn. Blood cultures have been  lactic acidosis is improving.  Femur fracture, right [facility]) Orthopedic surgery has been consulted recommended total hip arthroplasty versus hemiarthroplasty by Dr. Marlou Sa tomorrow morning if cleared by cards. Continue narcotics and Robaxin. Not cleared for surgery due to pulmonary issues. Cardiology has been consulted for surgical clearance.  Coffee-ground emesis/upper GI bleed/symptomatic anemia: With a previous hemoglobin of 10.2, on admission 10.8. She has been fluid resuscitated started on IV Protonix FOBT positive INR supra-therapeutic, will give FFP and vitamin K IV given.  Hold aspirin. INR is pending this morning. GI has been consulted [facility] was consulted to full liquid diet agree with PPI monitor hemoglobin, hold Coumadin. They recommended optimization from a cardiopulmonary standpoint, awaiting cardiology recommendations.  Persistent atrial fibrillation  ([place]): Coumadin held, given FFP and vitamin K. Sinus tach left axis deviation, not on a rate controlling medication. Now metoprolol IV for heart rate [id number]  Chronic diastolic CHF (congestive heart failure) ([place]) She appears euvolemic on physical exam no JVD, negative hepatojugular reflux no lower extremity edema. Hold Lasix, continue monitor strict I's and O's. Cardiology has been consulted, her most recent echo last year showed an EF of 60% no regional wall motion abnormality grade 1 diastolic heart failure. KVO IV fluids she has been fluid resuscitated. Current biomarkers have been minimally elevated, denies any chest pain or shortness of breath  Essential hypertension: Bp with stable. Was given losartan and Norvasc, and metoprolol IV for heart rate greater than 120.    DVT prophylaxis: SCD's Family Communication:Son Status is: Inpatient Remains inpatient appropriate because: acute gi bleed    Code Status:     Code Status Orders  (From admission, onward)           Start     Ordered   08/18/22 0630  Do not attempt resuscitation (DNR)  Continuous       Question Answer Comment  If patient has no pulse and is not breathing Do Not Attempt Resuscitation   If patient has a pulse and/or is breathing: Medical Treatment Goals COMFORT MEASURES: Keep clean/warm/dry, use medication by any route; positioning, wound care and other measures to relieve pain/suffering; use oxygen, suction/manual treatment of airway obstruction for comfort; do not transfer unless for comfort needs.   Consent: Discussion documented in EHR or advanced directives reviewed      08/18/22 0630           Code Status History     Date Active Date Inactive Code Status Order ID Comments User Context   08/18/2022 0629 08/18/2022 0630 DNR [id number]  Quintella Baton, MD ED   01/19/2014 1013 01/20/2014 2050 Full Code [id number]  Leonie Man, MD Inpatient   01/17/2014 1810 01/19/2014 1013 Full Code [id number]  Troy Sine, MD Inpatient   01/14/2014 1539 01/17/2014 1810 Full Code [id number]  Darlin Coco, MD Inpatient         IV Access:   Peripheral IV   Procedures and diagnostic studies:   CT ANGIO GI BLEED  Result Date: 08/18/2022 CLINICAL DATA:  Coffee ground emesis. Fell yesterday with acute proximal right femoral fracture. EXAM: CTA ABDOMEN AND PELVIS WITHOUT AND WITH CONTRAST TECHNIQUE: Multidetector CT imaging of the abdomen and pelvis was performed using the standard protocol during bolus administration of intravenous contrast. Multiplanar reconstructed images and MIPs were obtained and reviewed to evaluate the vascular anatomy. RADIATION DOSE REDUCTION: This exam was performed according to the departmental dose-optimization program which includes automated exposure control, adjustment of the mA and/or kV according to patient size and/or use of iterative reconstruction technique. CONTRAST:  68m OMNIPAQUE IOHEXOL 350 MG/ML SOLN COMPARISON:  AP pelvis and femoral x-rays yesterday. CT abdomen pelvis with contrast 09/29/17, CTA chest, abdomen and pelvis 01/14/2014. FINDINGS: VASCULAR Aorta: There are moderate calcific plaques progressive from 2015 without aneurysm, dissection or critical stenosis. Celiac: Progress ostial calcific plaques with 75% vessel origin stenosis, otherwise opacifies well but with branch vessel atherosclerosis also seen. SMA: There are calcific plaques proximally with up to 40% stenosis in the proximal 1 cm. There is no other flow-limiting stenosis. No branch occlusion. Renals: Both renal arteries are patent without evidence of aneurysm, dissection, vasculitis, fibromuscular dysplasia or significant stenosis. IMA: Severe calcific origin stenosis. The vessel otherwise opacifies well. No branch occlusion. Inflow: Moderate patchy calcific plaques. No flow-limiting inflow stenosis. Proximal Outflow: There are calcific plaques heaviest in the superficial femoral arteries with at least  60% stenosis in the visualized portions. Veins: Patent. Review of the MIP images confirms the above findings. NON-VASCULAR Lower chest: Mild cardiomegaly. There is calcification in the coronary arteries and mitral ring. Minimal pericardial effusion. There are moderate layering pleural effusions. There is patchy ground-glass disease in the lung bases which could be due to edema or pneumonia . Hepatobiliary: Respiratory motion limits fine detail. There is no obvious liver mass. Gallbladder and bile ducts are unremarkable, as visualized. Pancreas: Atrophic and otherwise unremarkable. Spleen: Unremarkable. Adrenals/Urinary Tract: There is no adrenal mass. No renal mass is seen through the motion artifact. There is urothelial thickening and stranding involving the left renal pelvis and proximal ureter concerning for ascending UTI, with mild thickening of the bladder and perivesical stranding compatible with cystitis. No urinary stone or obstruction is seen. Stomach/Bowel: The stomach distended with air and fluid and has a normal wall thickness. The small bowel is decompressed. There is wall thickening or underdistention in the distal ascending, transverse and descending colon. Large stool impaction in the rectum and possible stercoral proctitis. No contrast extravasation into the stomach or bowel is seen. An appendix is not seen in this patient. Lymphatic: There is body wall anasarca and generalized mesenteric congestive changes. Reproductive: Uterus and bilateral adnexa are unremarkable. Other: Small amount of scattered abdominal and pelvic free ascites. No drainable pocket. No free air. There is no incarcerated hernia. Musculoskeletal: Osteopenia, levoscoliosis and degenerative change lumbar spine. Transverse midcervical proximal right femoral fracture is again noted with impaction and mild cephalad displacement. IMPRESSION: 1. Aortic and branch vessel atherosclerosis. 2. 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 3. No contrast extravasation into the bowel is seen. 4. Moderate pleural effusions with patchy ground-glass disease in the lung bases which could be due to edema or pneumonia. 5. Cardiomegaly with calcific CAD. 6. Body wall anasarca, mesenteric congestion and small amount of scattered abdominal and pelvic free ascites. 7. Probable cystitis and likely left-sided ascending UTI. 8. Large stool impaction in the rectum with possible stercoral proctitis. 9. Colitis versus nondistention of the distal ascending, transverse and descending colon. 10. Distended stomach with air and fluid. No small bowel dilatation. 11. Transverse midcervical proximal right femoral fracture with impaction and mild cephalad displacement. 12. Osteopenia and degenerative change. Aortic Atherosclerosis (ICD10-I70.0). Electronically Signed   By: Telford Nab M.D.   On: 08/18/2022 05:02   DG Femur Portable 1 View Left  Result Date: 08/15/2022 CLINICAL DATA:  Trauma, fall. EXAM: LEFT FEMUR PORTABLE 1 VIEW COMPARISON:  Fall pelvis radiograph earlier today. FINDINGS: Divided frontal views of the left femur obtained. Cortical margins of the left femur are intact. No left femur fracture on this single view. Knee alignment is maintained. The included pubic rami are intact. Prominent vascular calcifications. IMPRESSION: 1. No left femur fracture on divided frontal view. 2. Prominent vascular calcifications. Electronically Signed   By: Keith Rake M.D.   On: 08/03/2022 23:52   CT HEAD WO CONTRAST  Result Date: 08/02/2022 CLINICAL DATA:  Head trauma EXAM: CT HEAD WITHOUT CONTRAST CT CERVICAL SPINE WITHOUT CONTRAST TECHNIQUE: Multidetector CT imaging of the head and cervical spine was performed following the standard protocol without intravenous contrast. Multiplanar CT image reconstructions of the cervical spine were also generated. RADIATION DOSE REDUCTION: This exam was performed  according to the departmental dose-optimization program which includes automated exposure control, adjustment of the mA and/or kV according to patient size and/or use of iterative reconstruction technique. COMPARISON:  None Available. FINDINGS: CT HEAD FINDINGS Brain: There is no mass, hemorrhage or extra-axial collection. The size and configuration of the ventricles and extra-axial CSF spaces are normal. There is hypoattenuation of the periventricular white matter, most commonly indicating chronic ischemic microangiopathy. Vascular: Atherosclerotic calcification of the vertebral and internal carotid arteries at the skull base. No abnormal hyperdensity of the major intracranial arteries or dural venous sinuses. Skull: The visualized skull base, calvarium and extracranial soft tissues are normal. Sinuses/Orbits: No fluid levels or advanced mucosal thickening of the visualized paranasal sinuses. No mastoid or middle ear effusion. The orbits are normal. CT CERVICAL SPINE FINDINGS Alignment: No static subluxation. Facets are aligned. Occipital condyles are normally positioned. Skull base and vertebrae: No acute fracture. Soft tissues and spinal canal: No prevertebral fluid or swelling. No visible canal hematoma. Disc levels: No advanced spinal canal or neural foraminal stenosis. Upper chest: Biapical emphysema Other: Normal visualized paraspinal cervical soft tissues. IMPRESSION: 1. No acute intracranial abnormality. 2. Chronic ischemic microangiopathy. 3. No acute fracture or static subluxation of the cervical spine. Electronically Signed   By: Ulyses Jarred M.D.   On: 08/20/2022 23:30   CT CERVICAL SPINE WO CONTRAST  Result Date: 08/13/2022 CLINICAL DATA:  Head trauma EXAM: CT HEAD WITHOUT CONTRAST CT CERVICAL SPINE WITHOUT CONTRAST TECHNIQUE: Multidetector CT imaging of the head and cervical spine was performed following the standard protocol without intravenous contrast. Multiplanar CT image reconstructions of  the cervical spine were also generated. RADIATION DOSE REDUCTION: This exam was performed according to the departmental dose-optimization program which includes automated exposure control, adjustment of the mA and/or kV according to patient size and/or use  of iterative reconstruction technique. COMPARISON:  None Available. FINDINGS: CT HEAD FINDINGS Brain: There is no mass, hemorrhage or extra-axial collection. The size and configuration of the ventricles and extra-axial CSF spaces are normal. There is hypoattenuation of the periventricular white matter, most commonly indicating chronic ischemic microangiopathy. Vascular: Atherosclerotic calcification of the vertebral and internal carotid arteries at the skull base. No abnormal hyperdensity of the major intracranial arteries or dural venous sinuses. Skull: The visualized skull base, calvarium and extracranial soft tissues are normal. Sinuses/Orbits: No fluid levels or advanced mucosal thickening of the visualized paranasal sinuses. No mastoid or middle ear effusion. The orbits are normal. CT CERVICAL SPINE FINDINGS Alignment: No static subluxation. Facets are aligned. Occipital condyles are normally positioned. Skull base and vertebrae: No acute fracture. Soft tissues and spinal canal: No prevertebral fluid or swelling. No visible canal hematoma. Disc levels: No advanced spinal canal or neural foraminal stenosis. Upper chest: Biapical emphysema Other: Normal visualized paraspinal cervical soft tissues. IMPRESSION: 1. No acute intracranial abnormality. 2. Chronic ischemic microangiopathy. 3. No acute fracture or static subluxation of the cervical spine. Electronically Signed   By: Ulyses Jarred M.D.   On: 08/11/2022 23:30   DG Pelvis Portable  Result Date: 08/08/2022 CLINICAL DATA:  Status post fall. EXAM: PORTABLE PELVIS 1-2 VIEWS COMPARISON:  None Available. FINDINGS: Acute fracture deformity is seen extending through the neck of the proximal right femur. There  is no evidence of dislocation. A chronic appearing deformity is suspected along the lateral aspect of the left iliac crest. Degenerative changes are seen involving both hips, as well as the visualized portion of the lower lumbar spine. Soft tissue structures are unremarkable. IMPRESSION: Acute fracture of the proximal right femur. Electronically Signed   By: Virgina Norfolk M.D.   On: 08/16/2022 23:13   DG FEMUR PORT, 1V RIGHT  Result Date: 07/31/2022 CLINICAL DATA:  Status post fall. EXAM: RIGHT FEMUR PORTABLE 1 VIEW COMPARISON:  None Available. FINDINGS: An acute fracture deformity is seen extending through the neck of the proximal right femur. Approximately 1/2 shaft width dorsal displacement of the distal fracture site is noted. There is no evidence of dislocation. Moderate severity vascular calcification is noted. IMPRESSION: Acute fracture of the proximal right femur. Electronically Signed   By: Virgina Norfolk M.D.   On: 07/30/2022 23:11   DG Chest Port 1 View  Result Date: 08/18/2022 CLINICAL DATA:  Status post fall. EXAM: PORTABLE CHEST 1 VIEW COMPARISON:  February 19, 2022 FINDINGS: The cardiac silhouette is mildly enlarged and unchanged in size. There is marked severity calcification of the aortic arch. Diffuse, chronic appearing increased interstitial lung markings are seen. Mild atelectatic changes are present within the bilateral lung bases. There is no evidence of a pleural effusion or pneumothorax. Multilevel degenerative changes seen throughout the thoracic spine. IMPRESSION: Chronic appearing increased interstitial lung markings with mild bibasilar atelectasis. Electronically Signed   By: Virgina Norfolk M.D.   On: 08/18/2022 23:09   DG Knee Right Port  Result Date: 08/03/2022 CLINICAL DATA:  Status post fall. EXAM: PORTABLE RIGHT KNEE - 1-2 VIEW COMPARISON:  None Available. FINDINGS: No evidence of acute fracture, dislocation, or joint effusion. There is marked severity  patellofemoral and medial tibiofemoral compartment space narrowing. Moderate severity vascular calcification is seen. IMPRESSION: Marked severity degenerative changes without an acute osseous abnormality. Electronically Signed   By: Virgina Norfolk M.D.   On: 08/20/2022 23:07     Medical Consultants:   None.   Subjective:  DOMINISHA KAMPMAN relates she only has pain when she moves.  Objective:    Vitals:   08/18/22 0300 08/18/22 0405 08/18/22 0500 08/18/22 0530  BP: (!) 147/74 126/68 (!) 147/64 138/70  Pulse: (!) 105 (!) 109 (!) 109 (!) 110  Resp: (!) '22 20 19 18  '$ Temp:  97.8 F (36.6 C)    TempSrc:  Oral    SpO2: 93% 90% 92% 90%  Weight:      Height:       SpO2: 90 % O2 Flow Rate (L/min): 3 L/min  No intake or output data in the 24 hours ending 08/18/22 0702 Filed Weights   07/24/2022 2243  Weight: 52.2 kg    Exam: General exam: In no acute distress. Respiratory system: Good air movement and clear to auscultation. Cardiovascular system: S1 & S2 heard, RRR. No JVD. Gastrointestinal system: Abdomen is nondistended, soft and nontender.  Extremities: No pedal edema. Skin: No rashes, lesions or ulcers Psychiatry: Judgement and insight appear normal. Mood & affect appropriate.    Data Reviewed:    Labs: Basic Metabolic Panel: Recent Labs  Lab 08/16/2022 2245 08/03/2022 2246  NA 139 137  K 4.2 4.2  CL 108 107  CO2  --  19*  GLUCOSE 237* 245*  BUN 24* 23  CREATININE 0.90 1.16*  CALCIUM  --  8.7*   GFR Estimated Creatinine Clearance: 26.5 mL/min (A) (by C-G formula based on SCr of 1.16 mg/dL (H)). Liver Function Tests: Recent Labs  Lab 07/28/2022 2246  AST 39  ALT 15  ALKPHOS 69  BILITOT 0.9  PROT 6.9  ALBUMIN 2.8*   No results for input(s): "LIPASE", "AMYLASE" in the last 168 hours. No results for input(s): "AMMONIA" in the last 168 hours. Coagulation profile Recent Labs  Lab 08/07/2022 2246  INR 3.0*   COVID-19 Labs  No results for input(s):  "DDIMER", "FERRITIN", "LDH", "CRP" in the last 72 hours.  No results found for: "SARSCOV2NAA"  CBC: Recent Labs  Lab 08/06/2022 2245 07/31/2022 2246  WBC  --  10.6*  HGB 11.2* 10.8*  HCT 33.0* 32.0*  MCV  --  91.2  PLT  --  214   Cardiac Enzymes: Recent Labs  Lab 08/12/2022 2246  CKTOTAL 379*   BNP (last 3 results) Recent Labs    02/19/22 1456  PROBNP 1,947*   CBG: Recent Labs  Lab 08/18/22 0651  GLUCAP 166*   D-Dimer: No results for input(s): "DDIMER" in the last 72 hours. Hgb A1c: No results for input(s): "HGBA1C" in the last 72 hours. Lipid Profile: No results for input(s): "CHOL", "HDL", "LDLCALC", "TRIG", "CHOLHDL", "LDLDIRECT" in the last 72 hours. Thyroid function studies: No results for input(s): "TSH", "T4TOTAL", "T3FREE", "THYROIDAB" in the last 72 hours.  Invalid input(s): "FREET3" Anemia work up: No results for input(s): "VITAMINB12", "FOLATE", "FERRITIN", "TIBC", "IRON", "RETICCTPCT" in the last 72 hours. Sepsis Labs: Recent Labs  Lab 08/18/2022 2246 08/18/22 0243  WBC 10.6*  --   LATICACIDVEN 4.2* 3.8*   Microbiology No results found for this or any previous visit (from the past 240 hour(s)).   Medications:    amLODipine  5 mg Oral Daily   insulin aspart  0-15 Units Subcutaneous Q6H   levothyroxine  50 mcg Oral Daily   losartan  100 mg Oral Daily   [START ON 07/24/2022] pantoprazole  40 mg Intravenous Q12H   Continuous Infusions:  ciprofloxacin     metronidazole     pantoprazole 8 mg/hr (08/18/22 0400)  LOS: 0 days   Charlynne Cousins  [facility]  08/18/2022, 7:02 AM

## 2022-08-18 NOTE — ED Notes (Signed)
ED TO INPATIENT HANDOFF REPORT  ED Nurse Name and Phone #: [phone]  S Name/Age/Gender Ana [name] 89 y.o. female Room/Bed: 012C/012C  Code Status   Code Status: DNR  Home/SNF/Other Home Patient oriented to: self and place Is this baseline? Yes   Triage Complete: Triage complete  Chief Complaint Femur fracture, right ([place]) [S72.91XA]  Triage Note No notes on file   Allergies Allergies  Allergen Reactions   Welchol [Colesevelam Hcl] Nausea Only   Zetia [Ezetimibe] Other (See Comments)    Edema    Crestor [Rosuvastatin] Other (See Comments)    Unknown   Statins Other (See Comments)    Unknown   Zocor [Simvastatin] Other (See Comments)    Muscle weakness    Level of Care/Admitting Diagnosis ED Disposition     ED Disposition  Admit   Condition  --   [facility]: [place] [[id number]]  Level of Care: Progressive [102]  Admit to Progressive based on following criteria: MULTISYSTEM THREATS such as stable sepsis, metabolic/electrolyte imbalance with or without encephalopathy that is responding to early treatment.  May admit patient to [facility] or [facility] if equivalent level of care is available:: Yes  Covid Evaluation: Confirmed COVID Negative  Diagnosis: Femur fracture, right [id number]  Admitting Physician: Quintella Baton [[id number]]  Attending Physician: Quintella Baton [id number]  Certification:: I certify this patient will need inpatient services for at least 2 midnights  Estimated Length of Stay: 2          B Medical/Surgery History Past Medical History:  Diagnosis Date   Anemia of chronic disease    Aortic insufficiency    moderate by echo 03/2018   Chronic diastolic CHF (congestive heart failure) ([place]) 2014   Diabetes mellitus without complication ([place])    Dizziness 02/13/2015   Hyperlipidemia    Hypertension    Hypothyroidism    MI, old 07/18/2013   Mitral stenosis    mild by echo 03/2018   Permanent atrial  fibrillation ([place])    Pulmonary HTN ([place])    PASP 59mHg 03/2018   Urinary incontinence    Vitamin D deficiency disease    Past Surgical History:  Procedure Laterality Date   CATARACT EXTRACTION     LAPAROSCOPIC APPENDECTOMY N/A 04/14/2017   Procedure: APPENDECTOMY LAPAROSCOPIC;  Surgeon: IFanny Skates MD;  Location: [facility];  Service: General;  Laterality: N/A;   LEFT HEART CATHETERIZATION WITH CORONARY ANGIOGRAM N/A 01/17/2014   Procedure: LEFT HEART CATHETERIZATION WITH CORONARY ANGIOGRAM;  Surgeon: TTroy Sine MD;  [facility];  Service: Cardiovascular;  Laterality: N/A;   PERCUTANEOUS CORONARY STENT INTERVENTION (PCI-S)  01/17/2014   Procedure: PERCUTANEOUS CORONARY STENT INTERVENTION (PCI-S);  Surgeon: TTroy Sine MD;  [facility];  Service: Cardiovascular;;   PERCUTANEOUS CORONARY STENT INTERVENTION (PCI-S) N/A 01/19/2014   Procedure: PERCUTANEOUS CORONARY STENT INTERVENTION (PCI-S);  Surgeon: DLeonie Man MD;  [facility];  Service: Cardiovascular;  Laterality: N/A;     A IV Location/Drains/Wounds Patient Lines/Drains/Airways Status     Active Line/Drains/Airways     Name Placement date Placement time Site Days   Peripheral IV 08/11/2022 20 G Left Forearm 08/07/2022  2248  Forearm  1   Peripheral IV 08/18/22 18 G Right Antecubital 08/18/22  0310  Antecubital  less than 1   Incision (Closed) 04/14/17 Abdomen Left 04/14/17  0845  -- 1952   Incision - 3 Ports Abdomen 1: Umbilicus 2: Right;Lateral 3: Left;Lateral  04/14/17  0805  -- 1952            Intake/Output Last 24 hours No intake or output data in the 24 hours ending 08/18/22 1235  Labs/Imaging Results for orders placed or performed during the hospital encounter of 08/01/2022 (from the past 48 hour(s))  ABO/Rh     Status: None   Collection Time: 08/20/2022 10:43 PM  Result Value Ref Range   ABO/RH(D)      A NEG Performed at [facility] [address], [place],  [place] [address]   I-Stat Chem 8, ED     Status: Abnormal   Collection Time: 08/11/2022 10:45 PM  Result Value Ref Range   Sodium 139 135 - 145 mmol/L   Potassium 4.2 3.5 - 5.1 mmol/L   Chloride 108 98 - 111 mmol/L   BUN 24 (H) 8 - 23 mg/dL   Creatinine, Ser 0.90 0.44 - 1.00 mg/dL   Glucose, Bld 237 (H) 70 - 99 mg/dL    Comment: Glucose reference range applies only to samples taken after fasting for at least 8 hours.   Calcium, Ion 1.08 (L) 1.15 - 1.40 mmol/L   TCO2 18 (L) 22 - 32 mmol/L   Hemoglobin 11.2 (L) 12.0 - 15.0 g/dL   HCT 33.0 (L) 36.0 - 46.0 %  CBC     Status: Abnormal   Collection Time: 07/30/2022 10:46 PM  Result Value Ref Range   WBC 10.6 (H) 4.0 - 10.5 K/uL   RBC 3.51 (L) 3.87 - 5.11 MIL/uL   Hemoglobin 10.8 (L) 12.0 - 15.0 g/dL   HCT 32.0 (L) 36.0 - 46.0 %   MCV 91.2 80.0 - 100.0 fL   MCH 30.8 26.0 - 34.0 pg   MCHC 33.8 30.0 - 36.0 g/dL   RDW 13.4 11.5 - 15.5 %   Platelets 214 150 - 400 K/uL   nRBC 0.0 0.0 - 0.2 %    Comment: Performed at [facility], [place] [address] [place], [place] [address]  Ethanol     Status: None   Collection Time: 07/28/2022 10:46 PM  Result Value Ref Range   Alcohol, Ethyl (B) <10 <10 mg/dL    Comment: (NOTE) Lowest detectable limit for serum alcohol is 10 mg/dL.  For medical purposes only. Performed at [facility], [place] [address], [place], [place] [address]   Lactic acid, plasma     Status: Abnormal   Collection Time: 07/31/2022 10:46 PM  Result Value Ref Range   Lactic Acid, Venous 4.2 (HH) 0.5 - 1.9 mmol/L    Comment: CRITICAL RESULT CALLED TO, READ BACK BY AND VERIFIED WITH C.CHRISCO,RN. [id number] 08/02/2022. LPAIT Performed at [facility], [place] [address], [place], [place] [address]   Protime-INR     Status: Abnormal   Collection Time: 08/18/2022 10:46 PM  Result Value Ref Range   Prothrombin Time 30.8 (H) 11.4 - 15.2 seconds   INR 3.0 (H) 0.8 - 1.2    Comment: (NOTE) INR goal varies based on device and disease  states. Performed at [facility], [place] [address] [place],  [address]   Comprehensive metabolic panel     Status: Abnormal   Collection Time: 08/18/2022 10:46 PM  Result Value Ref Range   Sodium 137 135 - 145 mmol/L   Potassium 4.2 3.5 - 5.1 mmol/L   Chloride 107 98 - 111 mmol/L   CO2 19 (L) 22 - 32 mmol/L   Glucose,  Bld 245 (H) 70 - 99 mg/dL    Comment: Glucose reference range applies only to samples taken after fasting for at least 8 hours.   BUN 23 8 - 23 mg/dL   Creatinine, Ser 1.16 (H) 0.44 - 1.00 mg/dL   Calcium 8.7 (L) 8.9 - 10.3 mg/dL   Total Protein 6.9 6.5 - 8.1 g/dL   Albumin 2.8 (L) 3.5 - 5.0 g/dL   AST 39 15 - 41 U/L   ALT 15 0 - 44 U/L   Alkaline Phosphatase 69 38 - 126 U/L   Total Bilirubin 0.9 0.3 - 1.2 mg/dL   GFR, Estimated 45 (L) >60 mL/min    Comment: (NOTE) Calculated using the CKD-EPI Creatinine Equation (2021)    Anion gap 11 5 - 15    Comment: Performed at [facility] [address], [place], [place] [address]  CK     Status: Abnormal   Collection Time: 07/24/2022 10:46 PM  Result Value Ref Range   Total CK 379 (H) 38 - 234 U/L    Comment: Performed at [facility], [place] [address] [place], [place] [address]  Troponin I (High Sensitivity)     Status: None   Collection Time: 08/18/2022 10:46 PM  Result Value Ref Range   Troponin I (High Sensitivity) 16 <18 ng/L    Comment: (NOTE) Elevated high sensitivity troponin I (hsTnI) values and significant  changes across serial measurements may suggest ACS but many other  chronic and acute conditions are known to elevate hsTnI results.  Refer to the "Links" section for chest pain algorithms and additional  guidance. Performed at [facility], [place] [address] [place], [place] [address]   POC occult blood, ED     Status: Abnormal   Collection Time: 08/18/22  2:34 AM  Result Value Ref Range   Fecal Occult Bld POSITIVE (A) NEGATIVE  Troponin I (High Sensitivity)     Status:  Abnormal   Collection Time: 08/18/22  2:43 AM  Result Value Ref Range   Troponin I (High Sensitivity) 25 (H) <18 ng/L    Comment: (NOTE) Elevated high sensitivity troponin I (hsTnI) values and significant  changes across serial measurements may suggest ACS but many other  chronic and acute conditions are known to elevate hsTnI results.  Refer to the "Links" section for chest pain algorithms and additional  guidance. Performed at [facility], [place] [address], [place], [place] [address]   Lactic acid, plasma     Status: Abnormal   Collection Time: 08/18/22  2:43 AM  Result Value Ref Range   Lactic Acid, Venous 3.8 (HH) 0.5 - 1.9 mmol/L    Comment: CRITICAL VALUE NOTED. VALUE IS CONSISTENT WITH PREVIOUSLY REPORTED/CALLED VALUE Performed at [facility], [place] [address], [place], [place] [address]   Lactic acid, plasma     Status: Abnormal   Collection Time: 08/18/22  6:48 AM  Result Value Ref Range   Lactic Acid, Venous 2.1 (HH) 0.5 - 1.9 mmol/L    Comment: CRITICAL VALUE NOTED. VALUE IS CONSISTENT WITH PREVIOUSLY REPORTED/CALLED VALUE Performed at [facility], [place] [address] [place], [place] [address]   CBG monitoring, ED     Status: Abnormal   Collection Time: 08/18/22  6:51 AM  Result Value Ref Range   Glucose-Capillary 166 (H) 70 - 99 mg/dL    Comment: Glucose reference range applies only to samples taken after fasting for at least 8 hours.  Prepare fresh frozen plasma     Status: None (Preliminary result)   Collection Time: 08/18/22  7:11 AM  Result Value Ref Range   Unit Number [id number]    Blood Component Type THAWED PLASMA    Unit division 00    Status of Unit ISSUED    Transfusion Status      OK TO TRANSFUSE Performed at [facility] [address] [place], [place] [address]   Prepare fresh frozen plasma     Status: None (Preliminary result)   Collection Time: 08/18/22  9:00 AM  Result Value Ref Range   Unit Number [id number]     Blood Component Type THAWED PLASMA    Unit division 00    Status of Unit ALLOCATED    Transfusion Status OK TO TRANSFUSE    DG Knee Complete 4 Views Left  Result Date: 08/18/2022 CLINICAL DATA:  Recent fall with left knee discomfort and bruising EXAM: LEFT KNEE - COMPLETE 4+ VIEW COMPARISON:  10/11/2013 FINDINGS: No visible knee joint effusion. Medial compartment joint space narrowing and osteophyte formation consistent with osteoarthritis. Bony irregularity of the posterior margin of the patella consistent with chondromalacia patella. No acute traumatic finding. Extensive regional arterial calcification is present. IMPRESSION: No acute or traumatic finding. Medial compartment osteoarthritis. Chondromalacia patella. Extensive regional arterial calcification. Electronically Signed   By: Nelson Chimes M.D.   On: 08/18/2022 10:18   CT ANGIO GI BLEED  Result Date: 08/18/2022 CLINICAL DATA:  Coffee ground emesis. Fell yesterday with acute proximal right femoral fracture. EXAM: CTA ABDOMEN AND PELVIS WITHOUT AND WITH CONTRAST TECHNIQUE: Multidetector CT imaging of the abdomen and pelvis was performed using the standard protocol during bolus administration of intravenous contrast. Multiplanar reconstructed images and MIPs were obtained and reviewed to evaluate the vascular anatomy. RADIATION DOSE REDUCTION: This exam was performed according to the departmental dose-optimization program which includes automated exposure control, adjustment of the mA and/or kV according to patient size and/or use of iterative reconstruction technique. CONTRAST:  55m OMNIPAQUE IOHEXOL 350 MG/ML SOLN COMPARISON:  AP pelvis and femoral x-rays yesterday. CT abdomen pelvis with contrast 09/29/17, CTA chest, abdomen and pelvis 01/14/2014. FINDINGS: VASCULAR Aorta: There are moderate calcific plaques progressive from 2015 without aneurysm, dissection or critical stenosis. Celiac: Progress ostial calcific plaques with 75% vessel origin  stenosis, otherwise opacifies well but with branch vessel atherosclerosis also seen. SMA: There are calcific plaques proximally with up to 40% stenosis in the proximal 1 cm. There is no other flow-limiting stenosis. No branch occlusion. Renals: Both renal arteries are patent without evidence of aneurysm, dissection, vasculitis, fibromuscular dysplasia or significant stenosis. IMA: Severe calcific origin stenosis. The vessel otherwise opacifies well. No branch occlusion. Inflow: Moderate patchy calcific plaques. No flow-limiting inflow stenosis. Proximal Outflow: There are calcific plaques heaviest in the superficial femoral arteries with at least 60% stenosis in the visualized portions. Veins: Patent. Review of the MIP images confirms the above findings. NON-VASCULAR Lower chest: Mild cardiomegaly. There is calcification in the coronary arteries and mitral ring. Minimal pericardial effusion. There are moderate layering pleural effusions. There is patchy ground-glass disease in the lung bases which could be due to edema or pneumonia . Hepatobiliary: Respiratory motion limits fine detail. There is no obvious liver mass. Gallbladder and bile ducts are unremarkable, as visualized. Pancreas: Atrophic and otherwise unremarkable. Spleen: Unremarkable. Adrenals/Urinary Tract: There is no adrenal mass. No renal mass is seen through the motion artifact. There is urothelial thickening and stranding involving the left  renal pelvis and proximal ureter concerning for ascending UTI, with mild thickening of the bladder and perivesical stranding compatible with cystitis. No urinary stone or obstruction is seen. Stomach/Bowel: The stomach distended with air and fluid and has a normal wall thickness. The small bowel is decompressed. There is wall thickening or underdistention in the distal ascending, transverse and descending colon. Large stool impaction in the rectum and possible stercoral proctitis. No contrast extravasation into  the stomach or bowel is seen. An appendix is not seen in this patient. Lymphatic: There is body wall anasarca and generalized mesenteric congestive changes. Reproductive: Uterus and bilateral adnexa are unremarkable. Other: Small amount of scattered abdominal and pelvic free ascites. No drainable pocket. No free air. There is no incarcerated hernia. Musculoskeletal: Osteopenia, levoscoliosis and degenerative change lumbar spine. Transverse midcervical proximal right femoral fracture is again noted with impaction and mild cephalad displacement. IMPRESSION: 1. Aortic and branch vessel atherosclerosis. 2. 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 3. No contrast extravasation into the bowel is seen. 4. Moderate pleural effusions with patchy ground-glass disease in the lung bases which could be due to edema or pneumonia. 5. Cardiomegaly with calcific CAD. 6. Body wall anasarca, mesenteric congestion and small amount of scattered abdominal and pelvic free ascites. 7. Probable cystitis and likely left-sided ascending UTI. 8. Large stool impaction in the rectum with possible stercoral proctitis. 9. Colitis versus nondistention of the distal ascending, transverse and descending colon. 10. Distended stomach with air and fluid. No small bowel dilatation. 11. Transverse midcervical proximal right femoral fracture with impaction and mild cephalad displacement. 12. Osteopenia and degenerative change. Aortic Atherosclerosis (ICD10-I70.0). Electronically Signed   By: Telford Nab M.D.   On: 08/18/2022 05:02   DG Femur Portable 1 View Left  Result Date: 08/14/2022 CLINICAL DATA:  Trauma, fall. EXAM: LEFT FEMUR PORTABLE 1 VIEW COMPARISON:  Fall pelvis radiograph earlier today. FINDINGS: Divided frontal views of the left femur obtained. Cortical margins of the left femur are intact. No left femur fracture on this single view. Knee alignment is maintained. The  included pubic rami are intact. Prominent vascular calcifications. IMPRESSION: 1. No left femur fracture on divided frontal view. 2. Prominent vascular calcifications. Electronically Signed   By: Keith Rake M.D.   On: 08/16/2022 23:52   CT HEAD WO CONTRAST  Result Date: 07/30/2022 CLINICAL DATA:  Head trauma EXAM: CT HEAD WITHOUT CONTRAST CT CERVICAL SPINE WITHOUT CONTRAST TECHNIQUE: Multidetector CT imaging of the head and cervical spine was performed following the standard protocol without intravenous contrast. Multiplanar CT image reconstructions of the cervical spine were also generated. RADIATION DOSE REDUCTION: This exam was performed according to the departmental dose-optimization program which includes automated exposure control, adjustment of the mA and/or kV according to patient size and/or use of iterative reconstruction technique. COMPARISON:  None Available. FINDINGS: CT HEAD FINDINGS Brain: There is no mass, hemorrhage or extra-axial collection. The size and configuration of the ventricles and extra-axial CSF spaces are normal. There is hypoattenuation of the periventricular white matter, most commonly indicating chronic ischemic microangiopathy. Vascular: Atherosclerotic calcification of the vertebral and internal carotid arteries at the skull base. No abnormal hyperdensity of the major intracranial arteries or dural venous sinuses. Skull: The visualized skull base, calvarium and extracranial soft tissues are normal. Sinuses/Orbits: No fluid levels or advanced mucosal thickening of the visualized paranasal sinuses. No mastoid or middle ear effusion. The orbits are normal. CT CERVICAL SPINE FINDINGS Alignment: No static  subluxation. Facets are aligned. Occipital condyles are normally positioned. Skull base and vertebrae: No acute fracture. Soft tissues and spinal canal: No prevertebral fluid or swelling. No visible canal hematoma. Disc levels: No advanced spinal canal or neural foraminal  stenosis. Upper chest: Biapical emphysema Other: Normal visualized paraspinal cervical soft tissues. IMPRESSION: 1. No acute intracranial abnormality. 2. Chronic ischemic microangiopathy. 3. No acute fracture or static subluxation of the cervical spine. Electronically Signed   By: Ulyses Jarred M.D.   On: 08/12/2022 23:30   CT CERVICAL SPINE WO CONTRAST  Result Date: 08/16/2022 CLINICAL DATA:  Head trauma EXAM: CT HEAD WITHOUT CONTRAST CT CERVICAL SPINE WITHOUT CONTRAST TECHNIQUE: Multidetector CT imaging of the head and cervical spine was performed following the standard protocol without intravenous contrast. Multiplanar CT image reconstructions of the cervical spine were also generated. RADIATION DOSE REDUCTION: This exam was performed according to the departmental dose-optimization program which includes automated exposure control, adjustment of the mA and/or kV according to patient size and/or use of iterative reconstruction technique. COMPARISON:  None Available. FINDINGS: CT HEAD FINDINGS Brain: There is no mass, hemorrhage or extra-axial collection. The size and configuration of the ventricles and extra-axial CSF spaces are normal. There is hypoattenuation of the periventricular white matter, most commonly indicating chronic ischemic microangiopathy. Vascular: Atherosclerotic calcification of the vertebral and internal carotid arteries at the skull base. No abnormal hyperdensity of the major intracranial arteries or dural venous sinuses. Skull: The visualized skull base, calvarium and extracranial soft tissues are normal. Sinuses/Orbits: No fluid levels or advanced mucosal thickening of the visualized paranasal sinuses. No mastoid or middle ear effusion. The orbits are normal. CT CERVICAL SPINE FINDINGS Alignment: No static subluxation. Facets are aligned. Occipital condyles are normally positioned. Skull base and vertebrae: No acute fracture. Soft tissues and spinal canal: No prevertebral fluid or  swelling. No visible canal hematoma. Disc levels: No advanced spinal canal or neural foraminal stenosis. Upper chest: Biapical emphysema Other: Normal visualized paraspinal cervical soft tissues. IMPRESSION: 1. No acute intracranial abnormality. 2. Chronic ischemic microangiopathy. 3. No acute fracture or static subluxation of the cervical spine. Electronically Signed   By: Ulyses Jarred M.D.   On: 07/28/2022 23:30   DG Pelvis Portable  Result Date: 07/25/2022 CLINICAL DATA:  Status post fall. EXAM: PORTABLE PELVIS 1-2 VIEWS COMPARISON:  None Available. FINDINGS: Acute fracture deformity is seen extending through the neck of the proximal right femur. There is no evidence of dislocation. A chronic appearing deformity is suspected along the lateral aspect of the left iliac crest. Degenerative changes are seen involving both hips, as well as the visualized portion of the lower lumbar spine. Soft tissue structures are unremarkable. IMPRESSION: Acute fracture of the proximal right femur. Electronically Signed   By: Virgina Norfolk M.D.   On: 08/09/2022 23:13   DG FEMUR PORT, 1V RIGHT  Result Date: 08/04/2022 CLINICAL DATA:  Status post fall. EXAM: RIGHT FEMUR PORTABLE 1 VIEW COMPARISON:  None Available. FINDINGS: An acute fracture deformity is seen extending through the neck of the proximal right femur. Approximately 1/2 shaft width dorsal displacement of the distal fracture site is noted. There is no evidence of dislocation. Moderate severity vascular calcification is noted. IMPRESSION: Acute fracture of the proximal right femur. Electronically Signed   By: Virgina Norfolk M.D.   On: 08/07/2022 23:11   DG Chest Port 1 View  Result Date: 08/06/2022 CLINICAL DATA:  Status post fall. EXAM: PORTABLE CHEST 1 VIEW COMPARISON:  February 19, 2022 FINDINGS: The  cardiac silhouette is mildly enlarged and unchanged in size. There is marked severity calcification of the aortic arch. Diffuse, chronic appearing increased  interstitial lung markings are seen. Mild atelectatic changes are present within the bilateral lung bases. There is no evidence of a pleural effusion or pneumothorax. Multilevel degenerative changes seen throughout the thoracic spine. IMPRESSION: Chronic appearing increased interstitial lung markings with mild bibasilar atelectasis. Electronically Signed   By: Virgina Norfolk M.D.   On: 07/27/2022 23:09   DG Knee Right Port  Result Date: 08/06/2022 CLINICAL DATA:  Status post fall. EXAM: PORTABLE RIGHT KNEE - 1-2 VIEW COMPARISON:  None Available. FINDINGS: No evidence of acute fracture, dislocation, or joint effusion. There is marked severity patellofemoral and medial tibiofemoral compartment space narrowing. Moderate severity vascular calcification is seen. IMPRESSION: Marked severity degenerative changes without an acute osseous abnormality. Electronically Signed   By: Virgina Norfolk M.D.   On: 08/06/2022 23:07    Pending Labs Unresulted Labs (From admission, onward)     Start     Ordered   07/28/2022 0500  Protime-INR  Tomorrow morning,   R        08/18/22 0628   07/29/2022 [id number]  Basic metabolic panel  Tomorrow morning,   R        08/18/22 0629   08/02/2022 0500  CK  Tomorrow morning,   R        08/18/22 0631   08/18/22 1928  CBC  Now then every 12 hours,   R     Comments: Call MD for hgb less than 8    08/18/22 1007   08/18/22 1000  Hemoglobin and hematocrit, blood  Now then every 6 hours,   R      08/18/22 0626   08/18/22 0701  Culture, blood (Routine X 2) w Reflex to ID Panel  BLOOD CULTURE X 2,   R      08/18/22 0700   08/18/22 0655  Brain natriuretic peptide  Once,   R        08/18/22 0654   08/18/22 0634  Lactic acid, plasma  STAT Now then every 3 hours,   R      08/18/22 0633   08/18/22 0632  Hemoglobin A1c  Once,   R       Comments: To assess prior glycemic control    08/18/22 0632   08/10/2022 2246  Urinalysis, Routine w reflex microscopic -Urine, Clean Catch  (Trauma Panel)   Once,   URGENT       Question:  Specimen Source  Answer:  Urine, Clean Catch   08/11/2022 2247            Vitals/Pain Today's Vitals   08/18/22 1130 08/18/22 1145 08/18/22 1200 08/18/22 1215  BP: 114/66 (!) 132/94 (!) 146/74 (!) 125/58  Pulse: (!) 107 (!) 110 (!) 124 (!) 111  Resp: 19 17 (!) 30 (!) 33  Temp:    98.1 F (36.7 C)  TempSrc:    Oral  SpO2: 95% 93% 92% 93%  Weight:      Height:      PainSc:        Isolation Precautions No active isolations  Medications Medications  pantoprozole (PROTONIX) 80 mg /NS 100 mL infusion (8 mg/hr Intravenous New Bag/Given 08/18/22 0400)  prochlorperazine (COMPAZINE) injection 5 mg (has no administration in time range)  ipratropium-albuterol (DUONEB) 0.5-2.5 (3) MG/3ML nebulizer solution 3 mL (has no administration in time range)  guaiFENesin (ROBITUSSIN) 100 MG/5ML liquid  5 mL (has no administration in time range)  HYDROmorphone (DILAUDID) injection 0.5 mg (has no administration in time range)  acetaminophen (TYLENOL) tablet 650 mg (has no administration in time range)    Or  acetaminophen (TYLENOL) suppository 650 mg (has no administration in time range)  senna-docusate (Senokot-S) tablet 1 tablet (has no administration in time range)  amLODipine (NORVASC) tablet 5 mg (has no administration in time range)  levothyroxine (SYNTHROID) tablet 50 mcg (50 mcg Oral Given 08/18/22 0721)  losartan (COZAAR) tablet 100 mg (has no administration in time range)  insulin aspart (novoLOG) injection 0-15 Units (3 Units Subcutaneous Given 08/18/22 0727)  metoprolol tartrate (LOPRESSOR) injection 5 mg (has no administration in time range)  0.9 %  sodium chloride infusion (Manually program via Guardrails IV Fluids) (0 mLs Intravenous Hold 08/18/22 0801)  Ampicillin-Sulbactam (UNASYN) 3 g in sodium chloride 0.9 % 100 mL IVPB (0 g Intravenous Stopped 08/18/22 0759)  polyethylene glycol (MIRALAX / GLYCOLAX) packet 17 g (17 g Oral Not Given 08/18/22 1107)  0.9  %  sodium chloride infusion (Manually program via Guardrails IV Fluids) (has no administration in time range)  ondansetron (ZOFRAN) injection 4 mg (4 mg Intravenous Given 08/05/2022 2301)  fentaNYL (SUBLIMAZE) injection 25 mcg (25 mcg Intravenous Given 08/18/2022 2352)  lactated ringers bolus 1,000 mL (0 mLs Intravenous Stopped 08/18/22 0135)  fentaNYL (SUBLIMAZE) injection 25 mcg (25 mcg Intravenous Given 08/18/22 0251)  lactated ringers bolus 500 mL (0 mLs Intravenous Stopped 08/18/22 0333)  pantoprazole (PROTONIX) 80 mg /NS 100 mL IVPB (0 mg Intravenous Stopped 08/18/22 0400)  iohexol (OMNIPAQUE) 350 MG/ML injection 75 mL (75 mLs Intravenous Contrast Given 08/18/22 0426)  lactated ringers bolus 500 mL (500 mLs Intravenous New Bag/Given 08/18/22 0528)  phytonadione (VITAMIN K) 5 mg in dextrose 5 % 50 mL IVPB (0 mg Intravenous Stopped 08/18/22 1107)    Mobility `     Focused Assessments Cardiac Assessment Handoff:  Cardiac Rhythm: Normal sinus rhythm, Sinus tachycardia Lab Results  Component Value Date   CKTOTAL 379 (H) 08/06/2022   TROPONINI 3.61 ([place]) 01/15/2014   No results found for: "DDIMER" Does the Patient currently have chest pain? No   , Neuro Assessment Handoff:  Swallow screen pass? No  Cardiac Rhythm: Normal sinus rhythm, Sinus tachycardia       Neuro Assessment: Within Defined Limits Neuro Checks:      Has TPA been given? No If patient is a Neuro Trauma and patient is going to OR before floor call report to [place] nurse: [phone] or [phone]  , Pulmonary Assessment Handoff:  Lung sounds:   O2 Device: Nasal Cannula O2 Flow Rate (L/min): 3 L/min    R Recommendations: See Admitting Provider Note  Report given to:   Additional Notes:

## 2022-08-18 NOTE — ED Notes (Signed)
Pt vomiting had black stools diarrhea  she told her son that she had been vomiting for 3 days

## 2022-08-18 NOTE — H&P (Addendum)
PCP:   London Pepper, MD   Chief Complaint:  Fall  HPI: This is a 89 year old female who lives alone.  She has past medical history of diabetes, dizziness, HLD, HTN, hypothyroidism, permanent atrial fibrillation and pulmonary hypertension.  Her son was unable to contact her for 24 hours.  He went over and found her on the floor.  The patient had apparently fallen the night before.  Despite being lucid, she was unable to say exactly how she fell.  She states she was standing and just went down.  There was no reports of chest pains, shortness of breath, palpitations.  Patient brought to ER, , Imaging positive for right femur fracture.  While in the ER patient had coffee-ground emesis.  She has supratherapeutic INR.  She is occult stool positive.  Hemoglobin 11.2, INR 3.0, initial lactic acid 4.2.  She told her son after that she said coffee-ground emesis the past 3 days.  Review of Systems:  The patient denies anorexia, fever, weight loss,, vision loss, decreased hearing, hoarseness, chest pain, syncope, dyspnea on exertion, peripheral edema, hemoptysis, abdominal pain, melena, hematochezia, severe indigestion/heartburn, hematuria, incontinence, genital sores, muscle weakness, suspicious skin lesions, transient blindness,  depression, unusual weight change, enlarged lymph nodes, angioedema, and breast masses. Positives: Fall, nausea, vomiting,  Past Medical History: Past Medical History:  Diagnosis Date   Anemia of chronic disease    Aortic insufficiency    moderate by echo 03/2018   Chronic diastolic CHF (congestive heart failure) ([place]) 2014   Diabetes mellitus without complication ([place])    Dizziness 02/13/2015   Hyperlipidemia    Hypertension    Hypothyroidism    MI, old 07/18/2013   Mitral stenosis    mild by echo 03/2018   Permanent atrial fibrillation ([place])    Pulmonary HTN (HCC)    PASP 90mHg 03/2018   Urinary incontinence    Vitamin D deficiency disease    Past Surgical  History:  Procedure Laterality Date   CATARACT EXTRACTION     LAPAROSCOPIC APPENDECTOMY N/A 04/14/2017   Procedure: APPENDECTOMY LAPAROSCOPIC;  Surgeon: IFanny Skates MD;  Location: [facility];  Service: General;  Laterality: N/A;   LEFT HEART CATHETERIZATION WITH CORONARY ANGIOGRAM N/A 01/17/2014   Procedure: LEFT HEART CATHETERIZATION WITH CORONARY ANGIOGRAM;  Surgeon: TTroy Sine MD;  [facility];  Service: Cardiovascular;  Laterality: N/A;   PERCUTANEOUS CORONARY STENT INTERVENTION (PCI-S)  01/17/2014   Procedure: PERCUTANEOUS CORONARY STENT INTERVENTION (PCI-S);  Surgeon: TTroy Sine MD;  [facility];  Service: Cardiovascular;;   PERCUTANEOUS CORONARY STENT INTERVENTION (PCI-S) N/A 01/19/2014   Procedure: PERCUTANEOUS CORONARY STENT INTERVENTION (PCI-S);  Surgeon: DLeonie Man MD;  [facility];  Service: Cardiovascular;  Laterality: N/A;    Medications: Prior to Admission medications   Medication Sig Start Date End Date Taking? Authorizing Provider  acetaminophen (TYLENOL) 500 MG tablet Take 500 mg by mouth every 8 (eight) hours as needed for moderate pain or headache.     [provider]  amLODipine (NORVASC) 5 MG tablet TAKE 1 TABLET BY MOUTH ONCE DAILY. MUST  KEEP  UPCOMING  APPOINTMENT  IN  AUGUST  2023  FOR  ADDITIONAL  REFILLS 03/05/22   TSueanne Margarita MD  aspirin EC 81 MG tablet Take 81 mg by mouth daily.    [provider]  Cholecalciferol (VITAMIN D3) 2000 UNITS TABS Take 2,000 Units by mouth daily.     [provider]  Coenzyme Q10 200  MG capsule Take 200 mg by mouth daily.    [provider]  Ferrous Sulfate (SLOW FE PO) Take 1 tablet by mouth daily.    [provider]  levothyroxine (SYNTHROID) 50 MCG tablet Take 50 mcg by mouth daily. 12/27/21   [provider]  losartan (COZAAR) 100 MG tablet Take 1 tablet by mouth once daily 03/04/22   Sueanne Margarita, MD  metFORMIN (GLUCOPHAGE) 1000  MG tablet Take 1 tablet (1,000 mg total) by mouth 2 (two) times daily with a meal. 02/16/14   Turner, Eber Hong, MD  Omega-3 Krill Oil 500 MG CAPS Take 500 mg by mouth daily.     [provider]  phenylephrine-shark liver oil-mineral oil-petrolatum (PREPARATION H) 0.25-3-14-71.9 % rectal ointment Place 1 application rectally 2 (two) times daily as needed for hemorrhoids.    [provider]  polyethylene glycol (MIRALAX / GLYCOLAX) packet Take 17 g by mouth daily as needed for moderate constipation.     [provider]  pravastatin (PRAVACHOL) 40 MG tablet TAKE 1 TABLET BY MOUTH ONCE DAILY IN THE EVENING 03/04/22   Sueanne Margarita, MD  vitamin B-12 (CYANOCOBALAMIN) 1000 MCG tablet Take 1,000 mcg by mouth daily.    [provider]  warfarin (COUMADIN) 2.5 MG tablet Take 1/2 tablet daily except 1 tablet on Monday and Friday or as directed by Anticoagulation Clinic. 05/30/22   Sueanne Margarita, MD    Allergies:   Allergies  Allergen Reactions   Welchol [Colesevelam Hcl] Nausea Only   Zetia [Ezetimibe] Other (See Comments)    Edema    Crestor [Rosuvastatin] Other (See Comments)    Unknown   Statins Other (See Comments)    Unknown   Zocor [Simvastatin] Other (See Comments)    Muscle weakness    Social History:  reports that she has never smoked. She has never used smokeless tobacco. She reports that she does not drink alcohol and does not use drugs.  Family History: Family History  Problem Relation Age of Onset   Cancer Mother        ent   Cirrhosis Father    Heart attack Sister    Heart disease Sister    Cancer Sister 45       breast cancer   Diabetes Brother    Cancer Other 30       breast    Physical Exam: Vitals:   08/18/22 0200 08/18/22 0230 08/18/22 0300 08/18/22 0405  BP: (!) 145/70 135/70 (!) 147/74 126/68  Pulse: (!) 106 (!) 104 (!) 105 (!) 109  Resp:  (!) 22 (!) 22 20  Temp:    97.8 F (36.6 C)  TempSrc:    Oral  SpO2: 97% 94% 93%  90%  Weight:      Height:        General:  Alert and oriented times 2, well developed and nourished, no acute distress Eyes: PERRLA, pink conjunctiva, no scleral icterus ENT: Moist oral mucosa, neck supple,  Cardiovascular: Tachycardic, regular rate and rhythm, no regurgitation, no gallops, no murmurs.  Abdomen: soft, positive BS, non-tender, non-distended, no organomegaly, not an acute abdomen GU: not examined Neuro: CN II - XII grossly intact Musculoskeletal: strength 5/5 all extremities, except right LE, not assessed secondary to pain, no clubbing, cyanosis or edema Skin: no rash, no subcutaneous crepitation, no decubitus Psych: Mildly confused patient   Labs on Admission:  Recent Labs    08/03/2022 2245 08/12/2022 2246  NA 139 137  K 4.2 4.2  CL 108 107  CO2  --  19*  GLUCOSE 237* 245*  BUN 24* 23  CREATININE 0.90 1.16*  CALCIUM  --  8.7*   Recent Labs    08/11/2022 2246  AST 39  ALT 15  ALKPHOS 69  BILITOT 0.9  PROT 6.9  ALBUMIN 2.8*    Recent Labs    08/18/2022 2245 07/24/2022 2246  WBC  --  10.6*  HGB 11.2* 10.8*  HCT 33.0* 32.0*  MCV  --  91.2  PLT  --  214   Recent Labs    08/11/2022 2246  CKTOTAL 379*    Radiological Exams on Admission: CT ANGIO GI BLEED  Result Date: 08/18/2022 CLINICAL DATA:  Coffee ground emesis. Fell yesterday with acute proximal right femoral fracture. EXAM: CTA ABDOMEN AND PELVIS WITHOUT AND WITH CONTRAST TECHNIQUE: Multidetector CT imaging of the abdomen and pelvis was performed using the standard protocol during bolus administration of intravenous contrast. Multiplanar reconstructed images and MIPs were obtained and reviewed to evaluate the vascular anatomy. RADIATION DOSE REDUCTION: This exam was performed according to the departmental dose-optimization program which includes automated exposure control, adjustment of the mA and/or kV according to patient size and/or use of iterative reconstruction technique. CONTRAST:  35m  OMNIPAQUE IOHEXOL 350 MG/ML SOLN COMPARISON:  AP pelvis and femoral x-rays yesterday. CT abdomen pelvis with contrast 09/29/17, CTA chest, abdomen and pelvis 01/14/2014. FINDINGS: VASCULAR Aorta: There are moderate calcific plaques progressive from 2015 without aneurysm, dissection or critical stenosis. Celiac: Progress ostial calcific plaques with 75% vessel origin stenosis, otherwise opacifies well but with branch vessel atherosclerosis also seen. SMA: There are calcific plaques proximally with up to 40% stenosis in the proximal 1 cm. There is no other flow-limiting stenosis. No branch occlusion. Renals: Both renal arteries are patent without evidence of aneurysm, dissection, vasculitis, fibromuscular dysplasia or significant stenosis. IMA: Severe calcific origin stenosis. The vessel otherwise opacifies well. No branch occlusion. Inflow: Moderate patchy calcific plaques. No flow-limiting inflow stenosis. Proximal Outflow: There are calcific plaques heaviest in the superficial femoral arteries with at least 60% stenosis in the visualized portions. Veins: Patent. Review of the MIP images confirms the above findings. NON-VASCULAR Lower chest: Mild cardiomegaly. There is calcification in the coronary arteries and mitral ring. Minimal pericardial effusion. There are moderate layering pleural effusions. There is patchy ground-glass disease in the lung bases which could be due to edema or pneumonia . Hepatobiliary: Respiratory motion limits fine detail. There is no obvious liver mass. Gallbladder and bile ducts are unremarkable, as visualized. Pancreas: Atrophic and otherwise unremarkable. Spleen: Unremarkable. Adrenals/Urinary Tract: There is no adrenal mass. No renal mass is seen through the motion artifact. There is urothelial thickening and stranding involving the left renal pelvis and proximal ureter concerning for ascending UTI, with mild thickening of the bladder and perivesical stranding compatible with cystitis.  No urinary stone or obstruction is seen. Stomach/Bowel: The stomach distended with air and fluid and has a normal wall thickness. The small bowel is decompressed. There is wall thickening or underdistention in the distal ascending, transverse and descending colon. Large stool impaction in the rectum and possible stercoral proctitis. No contrast extravasation into the stomach or bowel is seen. An appendix is not seen in this patient. Lymphatic: There is body wall anasarca and generalized mesenteric congestive changes. Reproductive: Uterus and bilateral adnexa are unremarkable. Other: Small amount of scattered abdominal and pelvic free ascites. No drainable pocket. No free air. There is no incarcerated hernia.  Musculoskeletal: Osteopenia, levoscoliosis and degenerative change lumbar spine. Transverse midcervical proximal right femoral fracture is again noted with impaction and mild cephalad displacement. IMPRESSION: 1. Aortic and branch vessel atherosclerosis. 2. 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 3. No contrast extravasation into the bowel is seen. 4. Moderate pleural effusions with patchy ground-glass disease in the lung bases which could be due to edema or pneumonia. 5. Cardiomegaly with calcific CAD. 6. Body wall anasarca, mesenteric congestion and small amount of scattered abdominal and pelvic free ascites. 7. Probable cystitis and likely left-sided ascending UTI. 8. Large stool impaction in the rectum with possible stercoral proctitis. 9. Colitis versus nondistention of the distal ascending, transverse and descending colon. 10. Distended stomach with air and fluid. No small bowel dilatation. 11. Transverse midcervical proximal right femoral fracture with impaction and mild cephalad displacement. 12. Osteopenia and degenerative change. Aortic Atherosclerosis (ICD10-I70.0). Electronically Signed   By: Telford Nab M.D.   On: 08/18/2022 05:02    DG Femur Portable 1 View Left  Result Date: 08/11/2022 CLINICAL DATA:  Trauma, fall. EXAM: LEFT FEMUR PORTABLE 1 VIEW COMPARISON:  Fall pelvis radiograph earlier today. FINDINGS: Divided frontal views of the left femur obtained. Cortical margins of the left femur are intact. No left femur fracture on this single view. Knee alignment is maintained. The included pubic rami are intact. Prominent vascular calcifications. IMPRESSION: 1. No left femur fracture on divided frontal view. 2. Prominent vascular calcifications. Electronically Signed   By: Keith Rake M.D.   On: 08/10/2022 23:52   CT HEAD WO CONTRAST  Result Date: 08/12/2022 CLINICAL DATA:  Head trauma EXAM: CT HEAD WITHOUT CONTRAST CT CERVICAL SPINE WITHOUT CONTRAST TECHNIQUE: Multidetector CT imaging of the head and cervical spine was performed following the standard protocol without intravenous contrast. Multiplanar CT image reconstructions of the cervical spine were also generated. RADIATION DOSE REDUCTION: This exam was performed according to the departmental dose-optimization program which includes automated exposure control, adjustment of the mA and/or kV according to patient size and/or use of iterative reconstruction technique. COMPARISON:  None Available. FINDINGS: CT HEAD FINDINGS Brain: There is no mass, hemorrhage or extra-axial collection. The size and configuration of the ventricles and extra-axial CSF spaces are normal. There is hypoattenuation of the periventricular white matter, most commonly indicating chronic ischemic microangiopathy. Vascular: Atherosclerotic calcification of the vertebral and internal carotid arteries at the skull base. No abnormal hyperdensity of the major intracranial arteries or dural venous sinuses. Skull: The visualized skull base, calvarium and extracranial soft tissues are normal. Sinuses/Orbits: No fluid levels or advanced mucosal thickening of the visualized paranasal sinuses. No mastoid or middle  ear effusion. The orbits are normal. CT CERVICAL SPINE FINDINGS Alignment: No static subluxation. Facets are aligned. Occipital condyles are normally positioned. Skull base and vertebrae: No acute fracture. Soft tissues and spinal canal: No prevertebral fluid or swelling. No visible canal hematoma. Disc levels: No advanced spinal canal or neural foraminal stenosis. Upper chest: Biapical emphysema Other: Normal visualized paraspinal cervical soft tissues. IMPRESSION: 1. No acute intracranial abnormality. 2. Chronic ischemic microangiopathy. 3. No acute fracture or static subluxation of the cervical spine. Electronically Signed   By: Ulyses Jarred M.D.   On: 07/25/2022 23:30   CT CERVICAL SPINE WO CONTRAST  Result Date: 08/12/2022 CLINICAL DATA:  Head trauma EXAM: CT HEAD WITHOUT CONTRAST CT CERVICAL SPINE WITHOUT CONTRAST TECHNIQUE: Multidetector CT imaging of the head and cervical spine was performed following the standard protocol  without intravenous contrast. Multiplanar CT image reconstructions of the cervical spine were also generated. RADIATION DOSE REDUCTION: This exam was performed according to the departmental dose-optimization program which includes automated exposure control, adjustment of the mA and/or kV according to patient size and/or use of iterative reconstruction technique. COMPARISON:  None Available. FINDINGS: CT HEAD FINDINGS Brain: There is no mass, hemorrhage or extra-axial collection. The size and configuration of the ventricles and extra-axial CSF spaces are normal. There is hypoattenuation of the periventricular white matter, most commonly indicating chronic ischemic microangiopathy. Vascular: Atherosclerotic calcification of the vertebral and internal carotid arteries at the skull base. No abnormal hyperdensity of the major intracranial arteries or dural venous sinuses. Skull: The visualized skull base, calvarium and extracranial soft tissues are normal. Sinuses/Orbits: No fluid levels  or advanced mucosal thickening of the visualized paranasal sinuses. No mastoid or middle ear effusion. The orbits are normal. CT CERVICAL SPINE FINDINGS Alignment: No static subluxation. Facets are aligned. Occipital condyles are normally positioned. Skull base and vertebrae: No acute fracture. Soft tissues and spinal canal: No prevertebral fluid or swelling. No visible canal hematoma. Disc levels: No advanced spinal canal or neural foraminal stenosis. Upper chest: Biapical emphysema Other: Normal visualized paraspinal cervical soft tissues. IMPRESSION: 1. No acute intracranial abnormality. 2. Chronic ischemic microangiopathy. 3. No acute fracture or static subluxation of the cervical spine. Electronically Signed   By: Ulyses Jarred M.D.   On: 07/31/2022 23:30   DG Pelvis Portable  Result Date: 08/01/2022 CLINICAL DATA:  Status post fall. EXAM: PORTABLE PELVIS 1-2 VIEWS COMPARISON:  None Available. FINDINGS: Acute fracture deformity is seen extending through the neck of the proximal right femur. There is no evidence of dislocation. A chronic appearing deformity is suspected along the lateral aspect of the left iliac crest. Degenerative changes are seen involving both hips, as well as the visualized portion of the lower lumbar spine. Soft tissue structures are unremarkable. IMPRESSION: Acute fracture of the proximal right femur. Electronically Signed   By: Virgina Norfolk M.D.   On: 07/27/2022 23:13   DG FEMUR PORT, 1V RIGHT  Result Date: 08/16/2022 CLINICAL DATA:  Status post fall. EXAM: RIGHT FEMUR PORTABLE 1 VIEW COMPARISON:  None Available. FINDINGS: An acute fracture deformity is seen extending through the neck of the proximal right femur. Approximately 1/2 shaft width dorsal displacement of the distal fracture site is noted. There is no evidence of dislocation. Moderate severity vascular calcification is noted. IMPRESSION: Acute fracture of the proximal right femur. Electronically Signed   By:  Virgina Norfolk M.D.   On: 07/27/2022 23:11   DG Chest Port 1 View  Result Date: 08/11/2022 CLINICAL DATA:  Status post fall. EXAM: PORTABLE CHEST 1 VIEW COMPARISON:  February 19, 2022 FINDINGS: The cardiac silhouette is mildly enlarged and unchanged in size. There is marked severity calcification of the aortic arch. Diffuse, chronic appearing increased interstitial lung markings are seen. Mild atelectatic changes are present within the bilateral lung bases. There is no evidence of a pleural effusion or pneumothorax. Multilevel degenerative changes seen throughout the thoracic spine. IMPRESSION: Chronic appearing increased interstitial lung markings with mild bibasilar atelectasis. Electronically Signed   By: Virgina Norfolk M.D.   On: 08/06/2022 23:09   DG Knee Right Port  Result Date: 08/08/2022 CLINICAL DATA:  Status post fall. EXAM: PORTABLE RIGHT KNEE - 1-2 VIEW COMPARISON:  None Available. FINDINGS: No evidence of acute fracture, dislocation, or joint effusion. There is marked severity patellofemoral and medial tibiofemoral compartment space narrowing.  Moderate severity vascular calcification is seen. IMPRESSION: Marked severity degenerative changes without an acute osseous abnormality. Electronically Signed   By: Virgina Norfolk M.D.   On: 08/03/2022 23:07     Moderate pleural effusions with patchy ground-glass disease in the lung bases which could be due to edema or pneumonia. 5. Cardiomegaly with calcific CAD. 6. Body wall anasarca, mesenteric congestion and small amount of scattered abdominal and pelvic free ascites. 7. Probable cystitis and likely left-sided ascending UTI. 8. Large stool impaction in the rectum with possible stercoral proctitis. 9. Colitis versus nondistention of the distal ascending, transverse and descending colon. 10. Distended stomach with air and fluid. No small bowel dilatation. 11. Transverse midcervical proximal right femoral fracture with impaction and mild  cephalad displacement. 1   Assessment/Plan Present on Admission:  Aspiration pneumonia/ Lactic acidosis -Lung sounds coarse throughout, high suspicion of aspiration pneumonia given confusion and coffee-ground emesis. -IV Cipro, nebulizers ordered. -Patient n.p.o. -Patient received IV fluid bolus in the ER.  IV fluids are continued as patient also likely with element of congestive heart failure.  Lactic acid was decreasing 4.2=>3.8  Concern for congestive heart failure/patient with chronic diastolic heart failure -CT scan shows anasarca moderate pleural effusions. -BNP ordered -IV Lasix, strict I's and O's, daily weights -Cardiology consult placed -Most recent Echo 03/18/2022   Femur fracture, right [facility]) -Orthopedic consult placed, orthopedic on-call aware.  Contacted by EDP -Not cleared for surgery due to pulmonary issues -Cardiology consult placed.  They will ultimately provide cardiac clearance   GI bleed/melena/supratherapeutic INR -NPO.  GI on-call contacted by EDP via epic chat -IV Protonix every 12 -H&H every 6hr x 3 occurances -Coumadin and aspirin on hold -FFP & Vitamin K ordered with GI bleed.   -Follow-up INR    Supratherapeutic INR/ Persistent atrial fibrillation (HCC) -Coumadin on hold.  INR in a.m. -FFP ordered  Constipation/stercoral proctitis/colitis -Cipro and Flagyl ordered -For now single suppository ordered   Coronary atherosclerosis of native coronary artery -Losartan, Norvasc resumed -Pravastatin and aspirin on hold   Essential hypertension -Losartan resumed   HLD (hyperlipidemia) -Stable, pravastatin on hold  Hypothyroidism -Synthroid resumed  Lorenza Winkleman 08/18/2022, 5:39 AM

## 2022-08-18 NOTE — Consult Note (Addendum)
Cardiology Consultation   Patient ID: Ana [name] MRN: [id number]; DOB: June 04, 1932  Admit date: 08/04/2022 Date of Consult: 08/18/2022  PCP:  London Pepper, MD   [place] Prov[facility] Cardiologist:  Dr Radford Pax    Patient Profile:   Ana [name] is a 89 y.o. female with a hx of HTN, HLD, hypothyroidism, type 2 DM, permanent atrial flutter/fibrillation, CAD status post stents, diastolic heart failure, pulmonary hypertension, anemia of chronic disease,  who is being seen 08/18/2022 for the evaluation of preop evaluation at the request of Dr Claria Dice.  History of Present Illness:   [name] with above past medical history presented to the ER today after a fall at home.  Per chart review, she lives alone at home, her son was not able to get in contact with her for 24 hours, subsequently found her on the floor at home when he visited her.  Patient was not able to explain how she fell.  She had mentioned that she was having coffee-ground emesis for the past 3 days. She denied any chest pain, shortness of breath, heart palpitation, dizziness.   Upon encounter, she is actively vomiting coffee ground liquid in bed. She appeared in distress, states she felt nausea and has been vomiting like this for a while. She is oriented to self and place. She is a poor historian. She can't give clear story of how she fell. She states normally she can walk in the house and perform ADLs independently. She states she is dizzy as well currently. She states she is always SOB, that has not changed for years. She denied chest pain.   Admission diagnostic revealed elevated creatinine 1.16 and GFR 45.  Albumin 2.8.  CK elevated at 379.  High sensitive troponin 16 >25.  Lactic acid 4.2 >3.8>2.1.  CBC revealed leukocytosis with WBC 10600 and hemoglobin 10.8.  INR 3.  FOBT positive.  Alcohol <10.  Chest x-ray showed chronic appearing increased interstitial lung markings with mild bibasilar atelectasis.  Right femur  x-ray revealed acute fracture of the proximal right femur.  No acute finding on right knee and pelvis x-ray.  No acute ICH or cervical spine fracture on CT scan.  At admission, she was noted with some confusion and abnormal lung sounds.  She was admitted to family medicine service for concern of sepsis and aspiration pneumonia, right femur fracture, GI bleed, and suspected CHF exacerbation.  She was started on IV antibiotic, given 2L IVF bolus. She was made NPO and started on IV Protonix,FFP and vitamin K was given for supratherapeutic INR reversal, and her aspirin and Coumadin were held.  Ortho surgery team was consulted, recommend surgery repair tomorrow.  Cardiology is consulted for preop evaluation as well as suspected diastolic heart failure.Marland Kitchen   Historically, patient follows Dr. Radford Pax outpatient, has multivessel CAD s/p PTCA/BMS to prox LAD and left circumflex on 01/17/14 and subsequent staged PCI with BMS to mid RCA on 01/19/14.  Most recent stress Myoview 11/08/2020 showed small defect of moderate severity in the basal inferior, mid inferior and apical inferior location.  Defect is nonreversible consistent with infarct or attenuation artifact.  This was an intermediate risk study, no ischemia noted.    She also has chronic diastolic heart failure. She was last seen in the office 02/19/2022, complained shortness of breath with exertional activity, appears euvolemic on exam, reported no anginal symptoms.  Echo from 03/18/2022 revealed LVEF 60 to 65%, mild LVH, grade 2 DD, normal RV, severely elevated PASP  with RVSP 68.66mHg, moderate LAE, moderate MR, mild MS with mean gradient 671mg. Mild to moderate AI.  Aortic sclerosis.  CT scan had revealed mild pulmonary fibrosis and bronchiectasis, concern for chronic hypersensitivity pneumonitis or sarcoidosis.  She was referred to pulmonology subsequently.  She was seen by pulmonology Dr. WeMelvyn Novasfelt CT finding was similar dating back to 2015, she was recommended remain  active as possible and follow up in 6 month.   Lastly, she has long standing history of permanent atrial fibrillation/flutter, historically anticoagulated on Coumadin, typically has no symptoms when heart rate is well-controlled, has not required any AV nodal blocking agents.    Past Medical History:  Diagnosis Date   Anemia of chronic disease    Aortic insufficiency    moderate by echo 03/2018   Chronic diastolic CH[place] (congestive heart failure) ([facility]   Di[place] without complication ([place]   Dizziness 02/13/2015   Hyperlipidemia    Hypertension    Hypothyroidism    MI, old 07/18/2013   Mitral stenosis    mild by echo 03/2018   Permanent atrial fibrillation ([facility]   Pulmonary HTN (H[facility])    PASP 398m 03/2018   Urinary incontinence    Vitamin D deficiency disease     Past Surgical History:  Procedure Laterality Date   CATARACT EXTRACTION     LAPAROSCOPIC APPENDECTOMY N/A 04/14/2017   Procedure: APPENDECTOMY LAPAROSCOPIC;  Surgeon: IngFanny SkatesD;  Location: [facility];  Service: General;  Laterality: N/A;   LEFT HEART CATHETERIZATION WITH CORONARY ANGIOGRAM N/A 01/17/2014   Procedure: LEFT HEART CATHETERIZATION WITH CORONARY ANGIOGRAM;  Surgeon: ThoTroy SineD;  [facility];  Service: Cardiovascular;  Laterality: N/A;   PERCUTANEOUS CORONARY STENT INTERVENTION (PCI-S)  01/17/2014   Procedure: PERCUTANEOUS CORONARY STENT INTERVENTION (PCI-S);  Surgeon: ThoTroy SineD;  [facility];  Service: Cardiovascular;;   PERCUTANEOUS CORONARY STENT INTERVENTION (PCI-S) N/A 01/19/2014   Procedure: PERCUTANEOUS CORONARY STENT INTERVENTION (PCI-S);  Surgeon: DavLeonie ManD;  [facility];  Service: Cardiovascular;  Laterality: N/A;     Home Medications:  Prior to Admission medications   Medication Sig Start Date End Date Taking? Authorizing Provider  amLODipine (NORVASC) 5 MG tablet TAKE 1 TABLET BY MOUTH ONCE DAILY. MUST  KEEP  UPCOMING   APPOINTMENT  IN  AUGUST  2023  FOR  ADDITIONAL  REFILLS Patient taking differently: Take 5 mg by mouth daily. 03/05/22  Yes Turner, TraEber HongD  Cholecalciferol (VITAMIN D3) 2000 UNITS TABS Take 2,000 Units by mouth every evening.   Yes [provider]  Ferrous Sulfate (SLOW FE PO) Take 1 tablet by mouth daily.   Yes [provider]  levothyroxine (SYNTHROID) 50 MCG tablet Take 50 mcg by mouth daily. 12/27/21  Yes [provider]  losartan (COZAAR) 100 MG tablet Take 1 tablet by mouth once daily 03/04/22  Yes Turner, TraEber HongD  metFORMIN (GLUCOPHAGE) 1000 MG tablet Take 1 tablet (1,000 mg total) by mouth 2 (two) times daily with a meal. Patient taking differently: Take 1,000 mg by mouth daily. 02/16/14  Yes Turner, TraEber HongD  pravastatin (PRAVACHOL) 40 MG tablet TAKE 1 TABLET BY MOUTH ONCE DAILY IN THE EVENING 03/04/22  Yes Turner, TraEber HongD  vitamin B-12 (CYANOCOBALAMIN) 1000 MCG tablet Take 1,000 mcg by mouth daily.   Yes [provider]  warfarin (COUMADIN) 2.5 MG tablet Take 1/2 tablet daily except 1 tablet on Monday and  Friday or as directed by Anticoagulation Clinic. Patient taking differently: Take 2.5 mg by mouth See admin instructions. 2 tablets on Monday and Friday.  Tab on Tue, Wed, Thu, Sat, Sun. 05/30/22  Yes Sueanne Margarita, MD  acetaminophen (TYLENOL) 500 MG tablet Take 500 mg by mouth every 8 (eight) hours as needed for moderate pain or headache.  Patient not taking: Reported on 08/18/2022    [provider]  aspirin EC 81 MG tablet Take 81 mg by mouth daily. Patient not taking: Reported on 08/18/2022    [provider]  Coenzyme Q10 200 MG capsule Take 200 mg by mouth daily. Patient not taking: Reported on 08/18/2022    [provider]  Omega-3 Krill Oil 500 MG CAPS Take 500 mg by mouth daily.  Patient not taking: Reported on 08/18/2022    [provider]  phenylephrine-shark liver oil-mineral oil-petrolatum  (PREPARATION H) 0.25-3-14-71.9 % rectal ointment Place 1 application rectally 2 (two) times daily as needed for hemorrhoids. Patient not taking: Reported on 08/18/2022    [provider]  polyethylene glycol (MIRALAX / GLYCOLAX) packet Take 17 g by mouth daily as needed for moderate constipation.  Patient not taking: Reported on 08/18/2022    [provider]    Inpatient Medications: Scheduled Meds:  sodium chloride   Intravenous Once   sodium chloride   Intravenous Once   amLODipine  5 mg Oral Daily   insulin aspart  0-15 Units Subcutaneous Q6H   levothyroxine  50 mcg Oral Daily   losartan  100 mg Oral Daily   metoprolol tartrate  25 mg Oral BID   polyethylene glycol  17 g Oral BID   Continuous Infusions:  ampicillin-sulbactam (UNASYN) IV Stopped (08/18/22 0759)   pantoprazole 8 mg/hr (08/18/22 0400)   PRN Meds: acetaminophen **OR** acetaminophen, guaiFENesin, HYDROmorphone (DILAUDID) injection, ipratropium-albuterol, metoprolol tartrate, prochlorperazine, senna-docusate  Allergies:    Allergies  Allergen Reactions   Welchol [Colesevelam Hcl] Nausea Only   Zetia [Ezetimibe] Other (See Comments)    Edema    Crestor [Rosuvastatin] Other (See Comments)    Unknown   Statins Other (See Comments)    Unknown   Zocor [Simvastatin] Other (See Comments)    Muscle weakness    Social History:   Social History   Socioeconomic History   Marital status: Widowed    Spouse name: Not on file   Number of children: Not on file   Years of education: Not on file   Highest education level: Not on file  Occupational History   Not on file  Tobacco Use   Smoking status: Never   Smokeless tobacco: Never  Vaping Use   Vaping Use: Never used  Substance and Sexual Activity   Alcohol use: No   Drug use: No   Sexual activity: Not on file  Other Topics Concern   Not on file  Social History Narrative   Not on file   Social Determinants of Health   Financial Resource  Strain: Not on file  Food Insecurity: Not on file  Transportation Needs: Not on file  Physical Activity: Not on file  Stress: Not on file  Social Connections: Not on file  Intimate Partner Violence: Not on file    Family History:    Family History  Problem Relation Age of Onset   Cancer Mother        ent   Cirrhosis Father    Heart attack Sister    Heart disease Sister  Cancer Sister 74       breast cancer   Diabetes Brother    Cancer Other 30       breast     ROS: Constitutional: feel poor  Eyes: Denied vision change or loss Ears/Nose/Mouth/Throat: Denied ear ache, sore throat, coughing, sinus pain Cardiovascular: denied chest pain/pressure Respiratory: chronic SOB Gastrointestinal: see HPI  Genital/Urinary: Denied dysuria, hematuria, urinary frequency/urgency Musculoskeletal: Denied muscle ache, joint pain, weakness Skin: Denied rash, wound Neuro: ? Syncope  Psych: Denied history of depression/anxiety  Endocrine: history of diabetes  Physical Exam/Data:   Vitals:   08/18/22 1145 08/18/22 1200 08/18/22 1215 08/18/22 1238  BP: (!) 132/94 (!) 146/74 (!) 125/58 114/83  Pulse: (!) 110 (!) 124 (!) 111 (!) 108  Resp: 17 (!) 30 (!) 33 (!) 23  Temp:   98.1 F (36.7 C) 97.9 F (36.6 C)  TempSrc:   Oral   SpO2: 93% 92% 93% 92%  Weight:      Height:       No intake or output data in the 24 hours ending 08/18/22 1251    08/20/2022   10:43 PM 03/25/2022    9:24 AM 02/19/2022    1:59 PM  Last 3 Weights  Weight (lbs) 115 lb 113 lb 6.4 oz 108 lb 9.6 oz  Weight (kg) 52.164 kg 51.438 kg 49.261 kg     Body mass index is 21.03 kg/m.   Vitals:  Vitals:   08/18/22 1215 08/18/22 1238  BP: (!) 125/58 114/83  Pulse: (!) 111 (!) 108  Resp: (!) 33 (!) 23  Temp: 98.1 F (36.7 C) 97.9 F (36.6 C)  SpO2: 93% 92%   General Appearance: actively vomiting, in discomfort, pale  HEENT: Normocephalic, atraumatic.  Neck: Supple, trachea midline, no JVDs Cardiovascular:  Regular, tachycardiac, normal S1-S2,  no murmur Respiratory: Resting breathing unlabored, lungs sounds clear to auscultation bilaterally, no use of accessory muscles. On 2LNC. Speaks short sentence   Gastrointestinal: Bowel sounds positive, abdomen soft, non-distended  Extremities: Able to move all extremities in bed without difficulty, 1- edema of BLE  Genitourinary: genital exam not performed Musculoskeletal: generalized muscular atrophy  Skin: Intact, warm, dry.  Neurologic: Alert, oriented to person, place. Fluent speech,mild cognitive and memory deficit Psychiatric: Normal affect. Mood is appropriate.     EKG:  The EKG was personally reviewed and demonstrates:    EKG from 08/14/2022 at 2253 appears to be  sinus tachcyardia 104bpm, PVC.   Telemetry:  Telemetry was personally reviewed and demonstrates:    Appear to be atrial flutter/fibrillation with RVR  with VR of 100-120s, occasional PVCs   Relevant CV Studies:    Echo from 03/18/22:   1. Left ventricular ejection fraction, by estimation, is 60 to 65%. The  left ventricle has normal function. The left ventricle has no regional  wall motion abnormalities. There is mild left ventricular hypertrophy of  the basal-septal segment. Left  ventricular diastolic parameters are consistent with Grade II diastolic  dysfunction (pseudonormalization).   2. Right ventricular systolic function is normal. The right ventricular  size is normal. There is severely elevated pulmonary artery systolic  pressure. The estimated right ventricular systolic pressure is 123456 mmHg.   3. Left atrial size was moderately dilated.   4. The mitral valve is normal in structure. Moderate mitral valve  regurgitation. Mild mitral stenosis. The mean mitral valve gradient is 6.0  mmHg. Moderate mitral annular calcification.   5. The aortic valve is tricuspid. There is mild  calcification of the  aortic valve. There is mild thickening of the aortic valve. Aortic  valve  regurgitation is mild to moderate. Aortic valve sclerosis is present, with  no evidence of aortic valve  stenosis. Aortic regurgitation PHT measures 339 msec.   6. The inferior vena cava is normal in size with greater than 50%  respiratory variability, suggesting right atrial pressure of 3 mmHg.   Comparison(s): Prior images unable to be directly viewed, comparison made  by report only.    Stress Myoview 11/08/2020:  Gated EF inaccurate due to underlying frequent ventricular ectopy and possible atrial flutter. There is a small defect of moderate severity present in the basal inferior, mid inferior and apical inferior location. The defect is non-reversible. This is consistent with infarct or attenuation artifact. This is an intermediate risk study. Recent 2D echo showed normal LVF so likely abnormal perfusion related to attenuation artifact. No sichemia.     Cardiac cath 01/17/14:  ANGIOGRAPHY:    The left main coronary artery was angiographically normal and bifurcated into the LAD and left circumflex coronary artery.    The LAD was a large vessel.  There were proximal 95% stenosis proximal to the region of the first small diagonal vessel.  There was a 70% stenosis in the proximal portion of the second diagonal vessel.  Otherwise, the LAD was free of significant disease and extended to the LV apex.   The left circumflex coronary artery was a large vessel and had 85% eccentric proximal stenosis. There was 40% smooth stenosis after the OM vessel.  There is an additional distal marginal vessel and small posterolateral-like vessel.   The RCA was a large vessel that had 95% stenosis in the midsegment before a prominent marginal branch.  This appeared to be somewhat ulcerated.  The marginal vessel was large caliber and extended distally and collateralize the PDA vessel retrograde.  The RCA was occluded at the acute margin prior to the PDA takeoff.  In addition to the collaterals from the  right coronary artery, the distal RCA was also collateralized from the left coronary system.   Left ventriculography revealed an ejection fraction of approximately 45%.  There was mid to basal inferior severe hypocontractility.     IMPRESSION:   Severe multivessel CAD with 95% proximal LAD stenosis, 70% stenosis in the proximal portion of the second diagonal branch, 85% stenosis in a large left circumflex coronary artery with 40% distal stenosis, and 95% stenosis in the mid RCA with total occlusion of the distal RCA in the region of the acute margin proximal to the PDA with collateralization to the PDA from the marginal branch, as well as collaterals to the distal RCA via left coronary system.   Mild/moderate LV dysfunction with severe hypokinesis of the mid to basal inferior wall.   Successful 2 vessel percutaneous coronary intervention with PTCA/bare-metal stenting of the LAD with insertion of a 3.5x18 mm MultiLink vision stent post dilated to 3.76 mm and a 95% stenosis being reduced to 0, and PTCA/bare-metal stenting of the 85% circumflex stenosis with insertion of a 3.5x15 mm MultiLink vision stent post dilated to 3.7, 8 mm with the stenosis being reduced to 0%.   RECOMMENDATION:   It appears that the total occlusion of the distal RCA may be chronic due to the presence of right and left to right.collaterals.  The mid RCA lesion does appear to be ulcerated.  The patient will be restarted on heparin and hydrated.  Tentative plans will be to  perform PCI of her mid RCA in approximately 2 days.  She is in sinus rhythm and will need aspirin and Plavix initially  With plans for long term Coumadin therapy in light of her PAF.     Staged PCI 01/19/14:  FINDINGS:  Hemodynamics:  Central Aortic Pressure / Mean: 112/50/74 mmHg Left Ventricular Pressure / LVEDP: 113/12/20 mmHg   Left Ventriculography: Deferred   Coronary Anatomy: RCA: Large-caliber vessel that is 100% occluded distally.  Just  prior to a large major artery marginal branch the provides right to right collateralization to the distal RCA system there is a ulcerated now 80-90% stenosis that crosses the takeoff of the artery marginal. This is a target lesion for today.   Percutaneous Coronary Intervention:  ACT checked to be greater than 250 Sec Guide: 6 Fr   JR 4 guide         Guidewire: [place] -- crossed just beyond the distal occlusion site, but was in a very small branch therefore no attempt was made to revascularize beyond the occlusion   Predilation Balloon: Euphora 2.5 mm x 15 mm;  8 Atm x 20 Sec,  Stent: MultiLink Vision BMS 2.75 mm x 18 mm;  Max inflation: 16 Atm x 30 Sec Final Diameter: 3.0 mm   Post deployment angiography in multiple views, with and without guidewire in place revealed excellent stent deployment and lesion coverage.  There was no evidence of dissection perforation.     POST-OPERATIVE DIAGNOSIS:   Successful PCI of the mid RCA reducing a 80-90% stenosis to 0% with TIMI-3 flow restored to the distal vessel occlusion. Brisk TIMI-3 flow major RV marginal branch providing collaterals to the distal RCA system. Relatively normal systolic pressures but moderately elevated LVEDP   PLAN OF CARE: Return to TCU for postcatheterization care. Standard post radial Care with TR band removal. Will defer decision as to when to initiate Coumadin therapy to the primary service.  Minor scanning was that we would initially use aspirin plus Plavix and not start Coumadin until later.  With 3 new bare-metal stents, would need at least one month of dual antiplatelet therapy after which my preference would be to stop aspirin and start Coumadin for A. Fib. She does have an elevated LVEDP and made benefit from low-dose diuretic on discharge. Anticipate discharge in the morning if stable.    Laboratory Data:  High Sensitivity Troponin:   Recent Labs  Lab 07/28/2022 2246 08/18/22 0243  TROPONINIHS 16 25*      Chemistry Recent Labs  Lab 08/06/2022 2245 07/27/2022 2246  NA 139 137  K 4.2 4.2  CL 108 107  CO2  --  19*  GLUCOSE 237* 245*  BUN 24* 23  CREATININE 0.90 1.16*  CALCIUM  --  8.7*  GFRNONAA  --  45*  ANIONGAP  --  11    Recent Labs  Lab 08/18/2022 2246  PROT 6.9  ALBUMIN 2.8*  AST 39  ALT 15  ALKPHOS 69  BILITOT 0.9   Lipids No results for input(s): "CHOL", "TRIG", "HDL", "LABVLDL", "LDLCALC", "CHOLHDL" in the last 168 hours.  Hematology Recent Labs  Lab 07/26/2022 2245 07/29/2022 2246  WBC  --  10.6*  RBC  --  3.51*  HGB 11.2* 10.8*  HCT 33.0* 32.0*  MCV  --  91.2  MCH  --  30.8  MCHC  --  33.8  RDW  --  13.4  PLT  --  214   Thyroid No results for input(s): "TSH", "FREET4"  in the last 168 hours.  BNPNo results for input(s): "BNP", "PROBNP" in the last 168 hours.  DDimer No results for input(s): "DDIMER" in the last 168 hours.   Radiology/Studies:  DG Knee Complete 4 Views Left  Result Date: 08/18/2022 CLINICAL DATA:  Recent fall with left knee discomfort and bruising EXAM: LEFT KNEE - COMPLETE 4+ VIEW COMPARISON:  10/11/2013 FINDINGS: No visible knee joint effusion. Medial compartment joint space narrowing and osteophyte formation consistent with osteoarthritis. Bony irregularity of the posterior margin of the patella consistent with chondromalacia patella. No acute traumatic finding. Extensive regional arterial calcification is present. IMPRESSION: No acute or traumatic finding. Medial compartment osteoarthritis. Chondromalacia patella. Extensive regional arterial calcification. Electronically Signed   By: Nelson Chimes M.D.   On: 08/18/2022 10:18   CT ANGIO GI BLEED  Result Date: 08/18/2022 CLINICAL DATA:  Coffee ground emesis. Fell yesterday with acute proximal right femoral fracture. EXAM: CTA ABDOMEN AND PELVIS WITHOUT AND WITH CONTRAST TECHNIQUE: Multidetector CT imaging of the abdomen and pelvis was performed using the standard protocol during bolus  administration of intravenous contrast. Multiplanar reconstructed images and MIPs were obtained and reviewed to evaluate the vascular anatomy. RADIATION DOSE REDUCTION: This exam was performed according to the departmental dose-optimization program which includes automated exposure control, adjustment of the mA and/or kV according to patient size and/or use of iterative reconstruction technique. CONTRAST:  68m OMNIPAQUE IOHEXOL 350 MG/ML SOLN COMPARISON:  AP pelvis and femoral x-rays yesterday. CT abdomen pelvis with contrast 09/29/17, CTA chest, abdomen and pelvis 01/14/2014. FINDINGS: VASCULAR Aorta: There are moderate calcific plaques progressive from 2015 without aneurysm, dissection or critical stenosis. Celiac: Progress ostial calcific plaques with 75% vessel origin stenosis, otherwise opacifies well but with branch vessel atherosclerosis also seen. SMA: There are calcific plaques proximally with up to 40% stenosis in the proximal 1 cm. There is no other flow-limiting stenosis. No branch occlusion. Renals: Both renal arteries are patent without evidence of aneurysm, dissection, vasculitis, fibromuscular dysplasia or significant stenosis. IMA: Severe calcific origin stenosis. The vessel otherwise opacifies well. No branch occlusion. Inflow: Moderate patchy calcific plaques. No flow-limiting inflow stenosis. Proximal Outflow: There are calcific plaques heaviest in the superficial femoral arteries with at least 60% stenosis in the visualized portions. Veins: Patent. Review of the MIP images confirms the above findings. NON-VASCULAR Lower chest: Mild cardiomegaly. There is calcification in the coronary arteries and mitral ring. Minimal pericardial effusion. There are moderate layering pleural effusions. There is patchy ground-glass disease in the lung bases which could be due to edema or pneumonia . Hepatobiliary: Respiratory motion limits fine detail. There is no obvious liver mass. Gallbladder and bile ducts are  unremarkable, as visualized. Pancreas: Atrophic and otherwise unremarkable. Spleen: Unremarkable. Adrenals/Urinary Tract: There is no adrenal mass. No renal mass is seen through the motion artifact. There is urothelial thickening and stranding involving the left renal pelvis and proximal ureter concerning for ascending UTI, with mild thickening of the bladder and perivesical stranding compatible with cystitis. No urinary stone or obstruction is seen. Stomach/Bowel: The stomach distended with air and fluid and has a normal wall thickness. The small bowel is decompressed. There is wall thickening or underdistention in the distal ascending, transverse and descending colon. Large stool impaction in the rectum and possible stercoral proctitis. No contrast extravasation into the stomach or bowel is seen. An appendix is not seen in this patient. Lymphatic: There is body wall anasarca and generalized mesenteric congestive changes. Reproductive: Uterus and bilateral adnexa are unremarkable. Other:  Small amount of scattered abdominal and pelvic free ascites. No drainable pocket. No free air. There is no incarcerated hernia. Musculoskeletal: Osteopenia, levoscoliosis and degenerative change lumbar spine. Transverse midcervical proximal right femoral fracture is again noted with impaction and mild cephalad displacement. IMPRESSION: 1. Aortic and branch vessel atherosclerosis. 2. 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 3. No contrast extravasation into the bowel is seen. 4. Moderate pleural effusions with patchy ground-glass disease in the lung bases which could be due to edema or pneumonia. 5. Cardiomegaly with calcific CAD. 6. Body wall anasarca, mesenteric congestion and small amount of scattered abdominal and pelvic free ascites. 7. Probable cystitis and likely left-sided ascending UTI. 8. Large stool impaction in the rectum with possible stercoral proctitis.  9. Colitis versus nondistention of the distal ascending, transverse and descending colon. 10. Distended stomach with air and fluid. No small bowel dilatation. 11. Transverse midcervical proximal right femoral fracture with impaction and mild cephalad displacement. 12. Osteopenia and degenerative change. Aortic Atherosclerosis (ICD10-I70.0). Electronically Signed   By: Telford Nab M.D.   On: 08/18/2022 05:02   DG Femur Portable 1 View Left  Result Date: 08/03/2022 CLINICAL DATA:  Trauma, fall. EXAM: LEFT FEMUR PORTABLE 1 VIEW COMPARISON:  Fall pelvis radiograph earlier today. FINDINGS: Divided frontal views of the left femur obtained. Cortical margins of the left femur are intact. No left femur fracture on this single view. Knee alignment is maintained. The included pubic rami are intact. Prominent vascular calcifications. IMPRESSION: 1. No left femur fracture on divided frontal view. 2. Prominent vascular calcifications. Electronically Signed   By: Keith Rake M.D.   On: 08/01/2022 23:52   CT HEAD WO CONTRAST  Result Date: 08/12/2022 CLINICAL DATA:  Head trauma EXAM: CT HEAD WITHOUT CONTRAST CT CERVICAL SPINE WITHOUT CONTRAST TECHNIQUE: Multidetector CT imaging of the head and cervical spine was performed following the standard protocol without intravenous contrast. Multiplanar CT image reconstructions of the cervical spine were also generated. RADIATION DOSE REDUCTION: This exam was performed according to the departmental dose-optimization program which includes automated exposure control, adjustment of the mA and/or kV according to patient size and/or use of iterative reconstruction technique. COMPARISON:  None Available. FINDINGS: CT HEAD FINDINGS Brain: There is no mass, hemorrhage or extra-axial collection. The size and configuration of the ventricles and extra-axial CSF spaces are normal. There is hypoattenuation of the periventricular white matter, most commonly indicating chronic ischemic  microangiopathy. Vascular: Atherosclerotic calcification of the vertebral and internal carotid arteries at the skull base. No abnormal hyperdensity of the major intracranial arteries or dural venous sinuses. Skull: The visualized skull base, calvarium and extracranial soft tissues are normal. Sinuses/Orbits: No fluid levels or advanced mucosal thickening of the visualized paranasal sinuses. No mastoid or middle ear effusion. The orbits are normal. CT CERVICAL SPINE FINDINGS Alignment: No static subluxation. Facets are aligned. Occipital condyles are normally positioned. Skull base and vertebrae: No acute fracture. Soft tissues and spinal canal: No prevertebral fluid or swelling. No visible canal hematoma. Disc levels: No advanced spinal canal or neural foraminal stenosis. Upper chest: Biapical emphysema Other: Normal visualized paraspinal cervical soft tissues. IMPRESSION: 1. No acute intracranial abnormality. 2. Chronic ischemic microangiopathy. 3. No acute fracture or static subluxation of the cervical spine. Electronically Signed   By: Ulyses Jarred M.D.   On: 07/29/2022 23:30   CT CERVICAL SPINE WO CONTRAST  Result Date: 08/08/2022 CLINICAL DATA:  Head trauma EXAM: CT HEAD WITHOUT CONTRAST CT  CERVICAL SPINE WITHOUT CONTRAST TECHNIQUE: Multidetector CT imaging of the head and cervical spine was performed following the standard protocol without intravenous contrast. Multiplanar CT image reconstructions of the cervical spine were also generated. RADIATION DOSE REDUCTION: This exam was performed according to the departmental dose-optimization program which includes automated exposure control, adjustment of the mA and/or kV according to patient size and/or use of iterative reconstruction technique. COMPARISON:  None Available. FINDINGS: CT HEAD FINDINGS Brain: There is no mass, hemorrhage or extra-axial collection. The size and configuration of the ventricles and extra-axial CSF spaces are normal. There is  hypoattenuation of the periventricular white matter, most commonly indicating chronic ischemic microangiopathy. Vascular: Atherosclerotic calcification of the vertebral and internal carotid arteries at the skull base. No abnormal hyperdensity of the major intracranial arteries or dural venous sinuses. Skull: The visualized skull base, calvarium and extracranial soft tissues are normal. Sinuses/Orbits: No fluid levels or advanced mucosal thickening of the visualized paranasal sinuses. No mastoid or middle ear effusion. The orbits are normal. CT CERVICAL SPINE FINDINGS Alignment: No static subluxation. Facets are aligned. Occipital condyles are normally positioned. Skull base and vertebrae: No acute fracture. Soft tissues and spinal canal: No prevertebral fluid or swelling. No visible canal hematoma. Disc levels: No advanced spinal canal or neural foraminal stenosis. Upper chest: Biapical emphysema Other: Normal visualized paraspinal cervical soft tissues. IMPRESSION: 1. No acute intracranial abnormality. 2. Chronic ischemic microangiopathy. 3. No acute fracture or static subluxation of the cervical spine. Electronically Signed   By: Ulyses Jarred M.D.   On: 08/07/2022 23:30   DG Pelvis Portable  Result Date: 08/09/2022 CLINICAL DATA:  Status post fall. EXAM: PORTABLE PELVIS 1-2 VIEWS COMPARISON:  None Available. FINDINGS: Acute fracture deformity is seen extending through the neck of the proximal right femur. There is no evidence of dislocation. A chronic appearing deformity is suspected along the lateral aspect of the left iliac crest. Degenerative changes are seen involving both hips, as well as the visualized portion of the lower lumbar spine. Soft tissue structures are unremarkable. IMPRESSION: Acute fracture of the proximal right femur. Electronically Signed   By: Virgina Norfolk M.D.   On: 07/31/2022 23:13   DG FEMUR PORT, 1V RIGHT  Result Date: 08/10/2022 CLINICAL DATA:  Status post fall. EXAM: RIGHT  FEMUR PORTABLE 1 VIEW COMPARISON:  None Available. FINDINGS: An acute fracture deformity is seen extending through the neck of the proximal right femur. Approximately 1/2 shaft width dorsal displacement of the distal fracture site is noted. There is no evidence of dislocation. Moderate severity vascular calcification is noted. IMPRESSION: Acute fracture of the proximal right femur. Electronically Signed   By: Virgina Norfolk M.D.   On: 07/24/2022 23:11   DG Chest Port 1 View  Result Date: 08/10/2022 CLINICAL DATA:  Status post fall. EXAM: PORTABLE CHEST 1 VIEW COMPARISON:  February 19, 2022 FINDINGS: The cardiac silhouette is mildly enlarged and unchanged in size. There is marked severity calcification of the aortic arch. Diffuse, chronic appearing increased interstitial lung markings are seen. Mild atelectatic changes are present within the bilateral lung bases. There is no evidence of a pleural effusion or pneumothorax. Multilevel degenerative changes seen throughout the thoracic spine. IMPRESSION: Chronic appearing increased interstitial lung markings with mild bibasilar atelectasis. Electronically Signed   By: Virgina Norfolk M.D.   On: 08/02/2022 23:09   DG Knee Right Port  Result Date: 07/27/2022 CLINICAL DATA:  Status post fall. EXAM: PORTABLE RIGHT KNEE - 1-2 VIEW COMPARISON:  None Available. FINDINGS:  No evidence of acute fracture, dislocation, or joint effusion. There is marked severity patellofemoral and medial tibiofemoral compartment space narrowing. Moderate severity vascular calcification is seen. IMPRESSION: Marked severity degenerative changes without an acute osseous abnormality. Electronically Signed   By: Virgina Norfolk M.D.   On: 07/29/2022 23:07     Assessment and Plan:   Preop risk evaluation - RCRI 2 - DASI 7.2, 3.63 METS - she is not clinically stable at this time, having active coffee ground emesis, lactic acidosis , recommend GI work as planned and optimize from  infection standpoint before operation   - she is at least moderate to high risk for surgery given underlying comorbidity and risk   Chronic diastolic heart failure - baseline chronic DOE has not changed per patient  - lab with elevated Cr 1.16, Hgb 10.8, lactic acidosis, in the setting of coffee ground emesis and + FOBT - clinically she has mild edema of BLE, lung sound fairly clear, no JVD - CXR with worsening interstitial lung markings with mild bibasilar atelectasis - BNP pending  - she does not appear in acute CHF, rather suspect aspiration pneumonia with underling ILD in the setting fall /on the ground for unknown duration , will check procal  - would hold off diuresis, will repeat BMP now as she received 2L bolus hydration   Permanent A fib/flutter with RVR - EKG and telemetry appears to be in A flutter /fib RVR - likely acute illness medaited  - historically requires no AVN blocking agents  - will start Metoprolol '25mg'$  BID for rate control now  - INR 3.1 >3 , goal 2-3 , reversed by FFP/Vit K per primary team for GI bleed, hold coumadin until cleared by GI   CAD s/p BMS stents to LAD, CX, RCA 2015  - no chest pain currently  - HS trop flat - EKG no acute ischemic changes - last stress myoview negative for ischemia 11/08/20 - ASA held, may hold statin until CK normalize   HTN - BP normal, continue PTA amlodipine and losartan, if renal index worsen, may hold losartan   Pulmonary hypertension - last Echo 03/18/22, severely elevated pulmonary artery systolic pressure with RVSP 68.10mHg  Acute hypoxic respiratory failure  Suspected aspiration pneumonitis  Lactic acidosis  GI bleed Anemia  Metabolic encephalopathy Fall with right femur fracture  - per primary team      Risk Assessment/Risk Scores:   New York Heart Association (NYHA) Functional Class NYHA Class II  CHA2DS2-VASc Score = 7  This indicates a 11.2% annual risk of stroke. The patient's score is based  upon: CHF History: 1 HTN History: 1 Diabetes History: 1 Stroke History: 0 Vascular Disease History: 1 Age Score: 2 Gender Score: 1     For questions or updates, please contact [email or web address] consult [email or web address] for contact info under    Signed, XMargie Billet NP  08/18/2022 12:51 PM

## 2022-08-18 NOTE — ED Notes (Signed)
To c-t and back to the ed

## 2022-08-18 NOTE — Consult Note (Signed)
Consultation  Referring Provider: ER MD/[facility] Primary Care Physician:  London Pepper, MD Primary Gastroenterologist: None, unassigned.  Reason for Consultation: Coffee ground emesis  HPI: Ana [name] is a 89 y.o. female, unassigned who was brought to the emergency room last evening after she had been found down at home by her son.  Patient says that she fell and was unable to get up for over 24 hours.  Prior to that time she had vomited 2 or 3 times over the prior couple of days and says that it was dark material.  She thought it was because she had been drinking chocolate sodas, she does have occasional heartburn/indigestion over the past couple years, no dysphagia.  Has had no complaints of abdominal pain, unaware of melena or hematochezia at home. She has been on Coumadin, last dose may have been a couple of days ago as she was unable to get to her medicines. She was noted to have an episode of what appeared to be coffee-ground emesis after she arrived to the emergency room and then on rectal exam had melenic appearing stool. Workup revealed a partially displaced right femur fracture. 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  The wall anasarca, mesenteric congestion and small amount of scattered abdominal and pelvic free ascites.  Labs on admission hemoglobin 11.2/INR 3 lactate 4.2 down to 2.1 today INR is pending today Troponin bland 16/25 Hemoglobin 10.8 this morning WBC 10.6 BUN 20/creatinine 0.9, LFTs within normal limits  Is unaware of any prior endoscopic evaluation. Her other medical issues include adult onset diabetes mellitus, hypertension, atrial fibrillation  for which she is on Coumadin, pulmonary hypertension hypothyroidism.   Past Medical History:  Diagnosis Date   Anemia of chronic disease    Aortic insufficiency    moderate by echo 03/2018   Chronic diastolic CHF (congestive heart failure) ([place]) 2014   Diabetes mellitus without complication ([place])    Dizziness 02/13/2015   Hyperlipidemia    Hypertension    Hypothyroidism    MI, old 07/18/2013   Mitral stenosis    mild by echo 03/2018   Permanent atrial fibrillation ([place])    Pulmonary HTN ([facility])    PASP 66mHg 03/2018   Urinary incontinence    Vitamin D deficiency disease     Past Surgical History:  Procedure Laterality Date   CATARACT EXTRACTION     LAPAROSCOPIC APPENDECTOMY N/A 04/14/2017   Procedure: APPENDECTOMY LAPAROSCOPIC;  Surgeon: IFanny Skates MD;  Location: [facility];  Service: General;  Laterality: N/A;   LEFT HEART CATHETERIZATION WITH CORONARY ANGIOGRAM N/A 01/17/2014   Procedure: LEFT HEART CATHETERIZATION WITH CORONARY ANGIOGRAM;  Surgeon: TTroy Sine MD;  [facility];  Service: Cardiovascular;  Laterality: N/A;   PERCUTANEOUS CORONARY STENT INTERVENTION (PCI-S)  01/17/2014   Procedure: PERCUTANEOUS CORONARY STENT INTERVENTION (PCI-S);  Surgeon: TTroy Sine MD;  [facility];  Service: Cardiovascular;;   PERCUTANEOUS CORONARY STENT INTERVENTION (PCI-S) N/A 01/19/2014   Procedure: PERCUTANEOUS CORONARY STENT INTERVENTION (PCI-S);  Surgeon: DLeonie Man MD;  [facility];  Service: Cardiovascular;  Laterality: N/A;    Prior to Admission medications   Medication Sig Start Date End Date  Taking? Authorizing Provider  acetaminophen (TYLENOL) 500 MG tablet Take 500 mg by mouth every 8 (eight) hours as needed for moderate pain or headache.     [provider]  amLODipine (NORVASC) 5 MG tablet TAKE 1 TABLET BY MOUTH ONCE DAILY. MUST  KEEP  UPCOMING  APPOINTMENT  IN  AUGUST  2023  FOR  ADDITIONAL  REFILLS 03/05/22   Sueanne Margarita, MD  aspirin EC 81 MG tablet Take 81 mg by mouth daily.    [provider]  Cholecalciferol (VITAMIN D3) 2000 UNITS TABS Take 2,000 Units by mouth daily.     [provider]  Coenzyme Q10 200 MG capsule Take 200 mg by mouth daily.    [provider]  Ferrous Sulfate (SLOW FE PO) Take 1 tablet by mouth daily.    [provider]  levothyroxine (SYNTHROID) 50 MCG tablet Take 50 mcg by mouth daily. 12/27/21   [provider]  losartan (COZAAR) 100 MG tablet Take 1 tablet by mouth once daily 03/04/22   Sueanne Margarita, MD  metFORMIN (GLUCOPHAGE) 1000 MG tablet Take 1 tablet (1,000 mg total) by mouth 2 (two) times daily with a meal. 02/16/14   Turner, Eber Hong, MD  Omega-3 Krill Oil 500 MG CAPS Take 500 mg by mouth daily.     [provider]  phenylephrine-shark liver oil-mineral oil-petrolatum (PREPARATION H) 0.25-3-14-71.9 % rectal ointment Place 1 application rectally 2 (two) times daily as needed for hemorrhoids.    [provider]  polyethylene glycol (MIRALAX / GLYCOLAX) packet Take 17 g by mouth daily as needed for moderate constipation.     [provider]  pravastatin (PRAVACHOL) 40 MG tablet TAKE 1 TABLET BY MOUTH ONCE DAILY IN THE EVENING 03/04/22   Sueanne Margarita, MD  vitamin B-12 (CYANOCOBALAMIN) 1000 MCG tablet Take 1,000 mcg by mouth daily.    [provider]  warfarin (COUMADIN) 2.5 MG tablet Take 1/2 tablet daily except 1 tablet on Monday and Friday or as directed by Anticoagulation Clinic. 05/30/22   Sueanne Margarita, MD    Current Facility-Administered Medications  Medication Dose Route Frequency Provider Last Rate Last Admin   0.9 %  sodium chloride infusion (Manually program via Guardrails IV Fluids)   Intravenous Once Quintella Baton, MD   Held at 08/18/22 0801   0.9 %  sodium chloride infusion (Manually program via Guardrails IV Fluids)   Intravenous Once Quintella Baton, MD       acetaminophen (TYLENOL)  tablet 650 mg  650 mg Oral Q6H PRN Quintella Baton, MD       Or   acetaminophen (TYLENOL) suppository 650 mg  650 mg Rectal Q6H PRN Crosley, Debby, MD       amLODipine (NORVASC) tablet 5 mg  5 mg Oral Daily Crosley, Debby, MD       Ampicillin-Sulbactam (UNASYN) 3 g in sodium chloride 0.9 % 100 mL IVPB  3 g Intravenous Q12H Charlynne Cousins, MD   Stopped at 08/18/22 0759   guaiFENesin (ROBITUSSIN) 100 MG/5ML liquid 5 mL  5 mL Oral Q4H PRN Crosley, Debby, MD       HYDROmorphone (DILAUDID) injection 0.5 mg  0.5 mg Intravenous Q4H PRN Crosley, Debby, MD       insulin aspart (novoLOG) injection 0-15 Units  0-15 Units Subcutaneous Q6H Crosley, Debby, MD   3 Units at 08/18/22 0727   ipratropium-albuterol (DUONEB) 0.5-2.5 (3) MG/3ML nebulizer solution 3 mL  3 mL Nebulization Q4H PRN  Quintella Baton, MD       levothyroxine (SYNTHROID) tablet 50 mcg  50 mcg Oral Daily Quintella Baton, MD   50 mcg at 08/18/22 0721   losartan (COZAAR) tablet 100 mg  100 mg Oral Daily Crosley, Debby, MD       metoprolol tartrate (LOPRESSOR) injection 5 mg  5 mg Intravenous Q4H PRN Crosley, Debby, MD       pantoprozole (PROTONIX) 80 mg /NS 100 mL infusion  8 mg/hr Intravenous Continuous Sponseller, Rebekah R, PA-C 10 mL/hr at 08/18/22 0400 8 mg/hr at 08/18/22 0400   polyethylene glycol (MIRALAX / GLYCOLAX) packet 17 g  17 g Oral BID Charlynne Cousins, MD       prochlorperazine (COMPAZINE) injection 5 mg  5 mg Intravenous Q4H PRN Crosley, Debby, MD       senna-docusate (Senokot-S) tablet 1 tablet  1 tablet Oral QHS PRN Quintella Baton, MD       Current Outpatient Medications  Medication Sig Dispense Refill   acetaminophen (TYLENOL) 500 MG tablet Take 500 mg by mouth every 8 (eight) hours as needed for moderate pain or headache.      amLODipine (NORVASC) 5 MG tablet TAKE 1 TABLET BY MOUTH ONCE DAILY. MUST  KEEP  UPCOMING  APPOINTMENT  IN  AUGUST  2023  FOR  ADDITIONAL  REFILLS 30 tablet 11   aspirin EC 81 MG tablet Take 81  mg by mouth daily.     Cholecalciferol (VITAMIN D3) 2000 UNITS TABS Take 2,000 Units by mouth daily.      Coenzyme Q10 200 MG capsule Take 200 mg by mouth daily.     Ferrous Sulfate (SLOW FE PO) Take 1 tablet by mouth daily.     levothyroxine (SYNTHROID) 50 MCG tablet Take 50 mcg by mouth daily.     losartan (COZAAR) 100 MG tablet Take 1 tablet by mouth once daily 90 tablet 3   metFORMIN (GLUCOPHAGE) 1000 MG tablet Take 1 tablet (1,000 mg total) by mouth 2 (two) times daily with a meal. 60 tablet 0   Omega-3 Krill Oil 500 MG CAPS Take 500 mg by mouth daily.      phenylephrine-shark liver oil-mineral oil-petrolatum (PREPARATION H) 0.25-3-14-71.9 % rectal ointment Place 1 application rectally 2 (two) times daily as needed for hemorrhoids.     polyethylene glycol (MIRALAX / GLYCOLAX) packet Take 17 g by mouth daily as needed for moderate constipation.      pravastatin (PRAVACHOL) 40 MG tablet TAKE 1 TABLET BY MOUTH ONCE DAILY IN THE EVENING 90 tablet 3   vitamin B-12 (CYANOCOBALAMIN) 1000 MCG tablet Take 1,000 mcg by mouth daily.     warfarin (COUMADIN) 2.5 MG tablet Take 1/2 tablet daily except 1 tablet on Monday and Friday or as directed by Anticoagulation Clinic. 70 tablet 1    Allergies as of 07/25/2022 - Reviewed 07/27/2022  Allergen Reaction Noted   Welchol [colesevelam hcl] Nausea Only 11/03/2013   Zetia [ezetimibe] Other (See Comments) 11/03/2013   Crestor [rosuvastatin] Other (See Comments) 11/03/2013   Statins Other (See Comments)    Zocor [simvastatin] Other (See Comments) 11/03/2013    Family History  Problem Relation Age of Onset   Cancer Mother        ent   Cirrhosis Father    Heart attack Sister    Heart disease Sister    Cancer Sister 30       breast cancer   Diabetes Brother    Cancer Other 33  breast    Social History   Socioeconomic History   Marital status: Widowed    Spouse name: Not on file   Number of children: Not on file   Years of education: Not  on file   Highest education level: Not on file  Occupational History   Not on file  Tobacco Use   Smoking status: Never   Smokeless tobacco: Never  Vaping Use   Vaping Use: Never used  Substance and Sexual Activity   Alcohol use: No   Drug use: No   Sexual activity: Not on file  Other Topics Concern   Not on file  Social History Narrative   Not on file   Social Determinants of Health   Financial Resource Strain: Not on file  Food Insecurity: Not on file  Transportation Needs: Not on file  Physical Activity: Not on file  Stress: Not on file  Social Connections: Not on file  Intimate Partner Violence: Not on file    Review of Systems: Pertinent positive and negative review of systems were noted in the above HPI section.  All other review of systems was otherwise negative.   Physical Exam: Vital signs in last 24 hours: Temp:  [97.7 F (36.5 C)-98.2 F (36.8 C)] 98.2 F (36.8 C) (02/26 0807) Pulse Rate:  [94-110] 110 (02/26 0530) Resp:  [15-29] 18 (02/26 0530) BP: (120-147)/(63-88) 138/70 (02/26 0530) SpO2:  [85 %-97 %] 90 % (02/26 0530) Weight:  [52.2 kg] 52.2 kg (02/25 2243)   General:   Alert,  Well-developed, frail-appearing very elderly white female pleasant and cooperative in NAD, on oxygen Head:  Normocephalic and atraumatic. Eyes:  Sclera clear, no icterus.   Conjunctiva pink. Ears:  Normal auditory acuity. Nose:  No deformity, discharge,  or lesions. Mouth:  No deformity or lesions.   Neck:  Supple; no masses or thyromegaly. Lungs: Scattered Rales, bilaterally  heart:  irRegular rate and rhythm; no murmurs, clicks, rubs,  or gallops. Abdomen:  Soft,nontender, BS active,nonpalp mass or hsm.   Rectal: Not done, committed heme positive, melenic appearing stool in briefs per ER MD Msk:  Symmetrical without gross deformities. . Pulses:  Normal pulses noted. Extremities:  Without clubbing or edema. Neurologic:  Alert and  oriented x3  grossly normal  neurologically. Skin:  Intact without significant lesions or rashes.. Psych:  Alert and cooperative. Normal mood and affect.  Intake/Output from previous day: No intake/output data recorded. Intake/Output this shift: No intake/output data recorded.  Lab Results: Recent Labs    08/16/2022 2245 08/03/2022 2246  WBC  --  10.6*  HGB 11.2* 10.8*  HCT 33.0* 32.0*  PLT  --  214   BMET Recent Labs    08/07/2022 2245 07/28/2022 2246  NA 139 137  K 4.2 4.2  CL 108 107  CO2  --  19*  GLUCOSE 237* 245*  BUN 24* 23  CREATININE 0.90 1.16*  CALCIUM  --  8.7*   LFT Recent Labs    07/24/2022 2246  PROT 6.9  ALBUMIN 2.8*  AST 39  ALT 15  ALKPHOS 69  BILITOT 0.9   PT/INR Recent Labs    07/26/2022 2246  LABPROT 30.8*  INR 3.0*   Hepatitis Panel No results for input(s): "HEPBSAG", "HCVAB", "HEPAIGM", "HEPBIGM" in the last 72 hours.   IMPRESSION:  #82 89 year old white female found down at home, had fallen and had been on the floor for over 24 hours.  Found to have a partially displaced right femur fracture  Surgery currently plans for hip replacement on Thursday  #2 Coffee ground emesis-in setting of supratherapeutic INR.  She may have some ulcerative esophagitis, doubt Mallory-Weiss tear but cannot rule out, rule out ulcer disease, erosive gastropathy  #3 moderate bilateral effusions, and groundglass densities, cannot rule out pneumonia versus volume overload.  Patient is now on oxygen and very wet sounding currently  #4 mild anemia acute secondary to #2 5.  Chronic anticoagulation/on Coumadin 6.  History of atrial fibrillation 7.  Pulmonary hypertension 8.  Adult onset diabetes mellitus 9.  Hypertension #10 anasarca/body wall edema likely reflecting volume overload  Plan; full liquid diet today PPI infusion has been started Serial hemoglobins and transfuse as indicated Hold Coumadin Cardiology has been consulted, will need to optimize cardiopulmonary status prior to  considering sedation GI will follow with you, hopefully can plan for EGD prior to her surgery on Thursday, once INR has drifted and tuned up from a pulmonary standpoint.      Jonathen Rathman PA-C 08/18/2022, 9:55 AM

## 2022-08-18 NOTE — TOC CAGE-AID Note (Signed)
Transition of Care [facility]) - CAGE-AID Screening   Patient Details  Name: Ana [name] MRN: [id number] Date of Birth: 21-Mar-1932  Transition of Care [facility]) CM/SW Contact:    Army Melia, RN Phone Number:[phone] 08/18/2022, 9:23 PM   Clinical Narrative:  No hx of drug/alcohol use, no resources indicated.   CAGE-AID Screening:    Have You Ever Felt You Ought to Cut Down on Your Drinking or Drug Use?: No Have People Annoyed You By Critizing Your Drinking Or Drug Use?: No Have You Felt Bad Or Guilty About Your Drinking Or Drug Use?: No Have You Ever Had a Drink or Used Drugs First Thing In The Morning to Steady Your Nerves or to Get Rid of a Hangover?: No CAGE-AID Score: 0  Substance Abuse Education Offered: No

## 2022-08-19 ENCOUNTER — Encounter (HOSPITAL_COMMUNITY): Admission: EM | Disposition: E | Payer: Self-pay | Source: Home / Self Care | Attending: Internal Medicine

## 2022-08-19 ENCOUNTER — Inpatient Hospital Stay (HOSPITAL_COMMUNITY): Payer: Medicare HMO

## 2022-08-19 DIAGNOSIS — I4819 Other persistent atrial fibrillation: Secondary | ICD-10-CM

## 2022-08-19 DIAGNOSIS — K920 Hematemesis: Secondary | ICD-10-CM

## 2022-08-19 DIAGNOSIS — I351 Nonrheumatic aortic (valve) insufficiency: Secondary | ICD-10-CM | POA: Diagnosis not present

## 2022-08-19 DIAGNOSIS — I342 Nonrheumatic mitral (valve) stenosis: Secondary | ICD-10-CM | POA: Diagnosis not present

## 2022-08-19 DIAGNOSIS — J690 Pneumonitis due to inhalation of food and vomit: Secondary | ICD-10-CM

## 2022-08-19 DIAGNOSIS — I5032 Chronic diastolic (congestive) heart failure: Secondary | ICD-10-CM | POA: Diagnosis not present

## 2022-08-19 DIAGNOSIS — J9601 Acute respiratory failure with hypoxia: Secondary | ICD-10-CM

## 2022-08-19 DIAGNOSIS — S72051A Unspecified fracture of head of right femur, initial encounter for closed fracture: Secondary | ICD-10-CM | POA: Diagnosis not present

## 2022-08-19 DIAGNOSIS — I480 Paroxysmal atrial fibrillation: Secondary | ICD-10-CM

## 2022-08-19 HISTORY — PX: TOTAL HIP ARTHROPLASTY: SHX124

## 2022-08-19 LAB — CBC
HCT: 30.1 % — ABNORMAL LOW (ref 36.0–46.0)
HCT: 28.7 % — ABNORMAL LOW (ref 36.0–46.0)
Hemoglobin: 10.1 g/dL — ABNORMAL LOW (ref 12.0–15.0)
Hemoglobin: 9.2 g/dL — ABNORMAL LOW (ref 12.0–15.0)
MCH: 30.3 pg (ref 26.0–34.0)
MCH: 29.7 pg (ref 26.0–34.0)
MCHC: 33.6 g/dL (ref 30.0–36.0)
MCHC: 32.1 g/dL (ref 30.0–36.0)
MCV: 90.4 fL (ref 80.0–100.0)
MCV: 92.6 fL (ref 80.0–100.0)
Platelets: 207 10*3/uL (ref 150–400)
Platelets: 207 10*3/uL (ref 150–400)
RBC: 3.33 MIL/uL — ABNORMAL LOW (ref 3.87–5.11)
RBC: 3.1 MIL/uL — ABNORMAL LOW (ref 3.87–5.11)
RDW: 13.7 % (ref 11.5–15.5)
RDW: 13.9 % (ref 11.5–15.5)
WBC: 21.3 10*3/uL — ABNORMAL HIGH (ref 4.0–10.5)
WBC: 20.9 10*3/uL — ABNORMAL HIGH (ref 4.0–10.5)
nRBC: 0 % (ref 0.0–0.2)
nRBC: 0 % (ref 0.0–0.2)

## 2022-08-19 LAB — BASIC METABOLIC PANEL
Anion gap: 9 (ref 5–15)
Anion gap: 12 (ref 5–15)
BUN: 31 mg/dL — ABNORMAL HIGH (ref 8–23)
BUN: 36 mg/dL — ABNORMAL HIGH (ref 8–23)
CO2: 23 mmol/L (ref 22–32)
CO2: 21 mmol/L — ABNORMAL LOW (ref 22–32)
Calcium: 8.4 mg/dL — ABNORMAL LOW (ref 8.9–10.3)
Calcium: 8.3 mg/dL — ABNORMAL LOW (ref 8.9–10.3)
Chloride: 107 mmol/L (ref 98–111)
Chloride: 106 mmol/L (ref 98–111)
Creatinine, Ser: 1.22 mg/dL — ABNORMAL HIGH (ref 0.44–1.00)
Creatinine, Ser: 1.36 mg/dL — ABNORMAL HIGH (ref 0.44–1.00)
GFR, Estimated: 43 mL/min — ABNORMAL LOW (ref >60)
GFR, Estimated: 37 mL/min — ABNORMAL LOW (ref >60)
Glucose, Bld: 132 mg/dL — ABNORMAL HIGH (ref 70–99)
Glucose, Bld: 185 mg/dL — ABNORMAL HIGH (ref 70–99)
Potassium: 4.6 mmol/L (ref 3.5–5.1)
Potassium: 4.3 mmol/L (ref 3.5–5.1)
Sodium: 139 mmol/L (ref 135–145)
Sodium: 139 mmol/L (ref 135–145)

## 2022-08-19 LAB — GLUCOSE, CAPILLARY
Glucose-Capillary: 103 mg/dL — ABNORMAL HIGH (ref 70–99)
Glucose-Capillary: 112 mg/dL — ABNORMAL HIGH (ref 70–99)
Glucose-Capillary: 164 mg/dL — ABNORMAL HIGH (ref 70–99)
Glucose-Capillary: 81 mg/dL (ref 70–99)
Glucose-Capillary: 99 mg/dL (ref 70–99)

## 2022-08-19 LAB — PREPARE FRESH FROZEN PLASMA
Unit division: 0
Unit division: 0

## 2022-08-19 LAB — ECHOCARDIOGRAM LIMITED
Calc EF: 60.9 %
Height: 62 in
S' Lateral: 2.4 cm
Single Plane A2C EF: 64.6 %
Single Plane A4C EF: 49.4 %
Weight: 1862.45 oz

## 2022-08-19 LAB — BLOOD GAS, ARTERIAL
Acid-base deficit: 4 mmol/L — ABNORMAL HIGH (ref 0.0–2.0)
Bicarbonate: 20.9 mmol/L (ref 20.0–28.0)
Drawn by: 33730
O2 Saturation: 96.3 %
Patient temperature: 36.5
pCO2 arterial: 36 mmHg (ref 32–48)
pH, Arterial: 7.37 (ref 7.35–7.45)
pO2, Arterial: 68 mmHg — ABNORMAL LOW (ref 83–108)

## 2022-08-19 LAB — BPAM FFP
Blood Product Expiration Date: 202402282359
Blood Product Expiration Date: 202402282359
ISSUE DATE / TIME: 202402261132
ISSUE DATE / TIME: 202402241016
Unit Type and Rh: 6200
Unit Type and Rh: 6200

## 2022-08-19 LAB — PROTIME-INR
INR: 1.6 — ABNORMAL HIGH (ref 0.8–1.2)
Prothrombin Time: 19.3 seconds — ABNORMAL HIGH (ref 11.4–15.2)

## 2022-08-19 LAB — HEMOGLOBIN A1C
Hgb A1c MFr Bld: 6.1 % — ABNORMAL HIGH (ref 4.8–5.6)
Mean Plasma Glucose: 128 mg/dL

## 2022-08-19 LAB — CK: Total CK: 670 U/L — ABNORMAL HIGH (ref 38–234)

## 2022-08-19 SURGERY — ARTHROPLASTY, HIP, TOTAL,POSTERIOR APPROACH
Anesthesia: Choice | Site: Hip | Laterality: Right

## 2022-08-19 MED ORDER — FUROSEMIDE 10 MG/ML IJ SOLN: 40.0000 mg | INTRAMUSCULAR | Status: DC

## 2022-08-19 MED ORDER — POTASSIUM CHLORIDE CRYS ER 20 MEQ PO TBCR: 40.0000 meq | EXTENDED_RELEASE_TABLET | Freq: Once | ORAL | Status: DC

## 2022-08-19 MED ORDER — FENTANYL CITRATE (PF) 250 MCG/5ML IJ SOLN
INTRAMUSCULAR | Status: AC
  Filled 2022-08-19: qty 5

## 2022-08-19 MED ORDER — HYDRALAZINE HCL 20 MG/ML IJ SOLN: 5.0000 mg | Freq: Four times a day (QID) | INTRAMUSCULAR | Status: DC | PRN

## 2022-08-19 MED ORDER — LINEZOLID 600 MG/300ML IV SOLN
600.0000 mg | Freq: Two times a day (BID) | INTRAVENOUS | Status: DC
  Administered 2022-08-19 – 2022-08-20 (×3): 600 mg via INTRAVENOUS
  Filled 2022-08-19 (×4): qty 300

## 2022-08-19 MED ORDER — AMLODIPINE BESYLATE 5 MG PO TABS
5.0000 mg | ORAL_TABLET | Freq: Every day | ORAL | Status: DC
  Administered 2022-08-19 – 2022-08-23 (×5): 5 mg via ORAL
  Filled 2022-08-19 (×5): qty 1

## 2022-08-19 MED ORDER — SODIUM CHLORIDE 0.9 % IV SOLN: INTRAVENOUS | Status: DC | PRN

## 2022-08-19 MED ORDER — HALOPERIDOL LACTATE 5 MG/ML IJ SOLN
1.0000 mg | Freq: Four times a day (QID) | INTRAMUSCULAR | Status: DC | PRN
  Administered 2022-08-19: 1 mg via INTRAVENOUS
  Filled 2022-08-19 (×2): qty 1

## 2022-08-19 MED ORDER — SODIUM CHLORIDE 0.9 % IV SOLN
1.0000 g | Freq: Two times a day (BID) | INTRAVENOUS | Status: DC
  Administered 2022-08-19 – 2022-08-20 (×3): 1 g via INTRAVENOUS
  Filled 2022-08-19 (×4): qty 20

## 2022-08-19 MED ORDER — FUROSEMIDE 10 MG/ML IJ SOLN
40.0000 mg | INTRAMUSCULAR | Status: AC
  Administered 2022-08-19: 40 mg via INTRAVENOUS
  Filled 2022-08-19: qty 4

## 2022-08-19 MED ORDER — MELATONIN 3 MG PO TABS
3.0000 mg | ORAL_TABLET | Freq: Every day | ORAL | Status: DC
  Administered 2022-08-19 – 2022-08-23 (×5): 3 mg via ORAL
  Filled 2022-08-19 (×5): qty 1

## 2022-08-19 MED ORDER — FUROSEMIDE 10 MG/ML IJ SOLN
40.0000 mg | Freq: Once | INTRAMUSCULAR | Status: AC
  Administered 2022-08-19: 40 mg via INTRAVENOUS
  Filled 2022-08-19: qty 4

## 2022-08-19 SURGICAL SUPPLY — 68 items
BAG COUNTER SPONGE SURGICOUNT (BAG) ×1 IMPLANT
BLADE CLIPPER SURG (BLADE) IMPLANT
BLADE SAW RECIP 87.9 MT (BLADE) ×1 IMPLANT
BRUSH FEMORAL CANAL (MISCELLANEOUS) ×1 IMPLANT
COVER BACK TABLE 24X17X13 BIG (DRAPES) IMPLANT
DRAPE HALF SHEET 40X57 (DRAPES) IMPLANT
DRAPE IMP U-DRAPE 54X76 (DRAPES) ×1 IMPLANT
DRAPE INCISE IOBAN 66X45 STRL (DRAPES) IMPLANT
DRAPE ORTHO SPLIT 77X108 STRL (DRAPES) ×2
DRAPE SURG 17X23 STRL (DRAPES) IMPLANT
DRAPE SURG ORHT 6 SPLT 77X108 (DRAPES) ×2 IMPLANT
DRAPE U-SHAPE 47X51 STRL (DRAPES) ×1 IMPLANT
DRSG MEPILEX BORDER 4X12 (GAUZE/BANDAGES/DRESSINGS) ×1 IMPLANT
DURAPREP 26ML APPLICATOR (WOUND CARE) ×1 IMPLANT
ELECT BLADE 6.5 EXT (BLADE) IMPLANT
ELECT CAUTERY BLADE 6.4 (BLADE) ×1 IMPLANT
ELECT REM PT RETURN 9FT ADLT (ELECTROSURGICAL) ×1
ELECTRODE REM PT RTRN 9FT ADLT (ELECTROSURGICAL) ×1 IMPLANT
EVACUATOR 1/8 PVC DRAIN (DRAIN) IMPLANT
FACESHIELD WRAPAROUND (MASK) ×1 IMPLANT
GLOVE BIO SURGEON ST LM GN SZ9 (GLOVE) ×1 IMPLANT
GLOVE BIOGEL PI IND STRL 8 (GLOVE) ×1 IMPLANT
GLOVE ECLIPSE 8.0 STRL XLNG CF (GLOVE) ×1 IMPLANT
GOWN STRL REUS W/ TWL LRG LVL3 (GOWN DISPOSABLE) ×1 IMPLANT
GOWN STRL REUS W/ TWL XL LVL3 (GOWN DISPOSABLE) ×1 IMPLANT
GOWN STRL REUS W/TWL LRG LVL3 (GOWN DISPOSABLE) ×1
GOWN STRL REUS W/TWL XL LVL3 (GOWN DISPOSABLE) ×1
HANDPIECE INTERPULSE COAX TIP (DISPOSABLE) ×1
HOOD PEEL AWAY FACE SHEILD DIS (HOOD) ×3 IMPLANT
IMMOBILIZER KNEE 20 (SOFTGOODS)
IMMOBILIZER KNEE 20 THIGH 36 (SOFTGOODS) IMPLANT
IMMOBILIZER KNEE 22 UNIV (SOFTGOODS) IMPLANT
IMMOBILIZER KNEE 24 THIGH 36 (MISCELLANEOUS) IMPLANT
IMMOBILIZER KNEE 24 UNIV (MISCELLANEOUS)
KIT BASIN OR (CUSTOM PROCEDURE TRAY) ×1 IMPLANT
KIT TURNOVER KIT B (KITS) ×1 IMPLANT
MANIFOLD NEPTUNE II (INSTRUMENTS) ×1 IMPLANT
NDL SUT .5 MAYO 1.404X.05X (NEEDLE) ×1 IMPLANT
NEEDLE HYPO 25GX1X1/2 BEV (NEEDLE) IMPLANT
NEEDLE MAYO TAPER (NEEDLE) ×1
NS IRRIG 1000ML POUR BTL (IV SOLUTION) ×1 IMPLANT
PACK TOTAL JOINT (CUSTOM PROCEDURE TRAY) ×1 IMPLANT
PACK UNIVERSAL I (CUSTOM PROCEDURE TRAY) ×1 IMPLANT
PAD ARMBOARD 7.5X6 YLW CONV (MISCELLANEOUS) ×2 IMPLANT
PASSER SUT SWANSON 36MM LOOP (INSTRUMENTS) ×1 IMPLANT
PILLOW ABDUCTION MEDIUM (MISCELLANEOUS) IMPLANT
PIN STEINMAN 3/16 (PIN) IMPLANT
PRESSURIZER FEMORAL UNIV (MISCELLANEOUS) IMPLANT
SET HNDPC FAN SPRY TIP SCT (DISPOSABLE) ×1 IMPLANT
SPONGE T-LAP 18X18 ~~LOC~~+RFID (SPONGE) IMPLANT
SPONGE T-LAP 4X18 ~~LOC~~+RFID (SPONGE) IMPLANT
STAPLER VISISTAT 35W (STAPLE) ×1 IMPLANT
SUCTION FRAZIER HANDLE 10FR (MISCELLANEOUS) ×1
SUCTION TUBE FRAZIER 10FR DISP (MISCELLANEOUS) ×1 IMPLANT
SUT ETHIBOND NAB CT1 #1 30IN (SUTURE) ×4 IMPLANT
SUT VIC AB 0 CT1 27 (SUTURE) ×4
SUT VIC AB 0 CT1 27XBRD ANBCTR (SUTURE) ×4 IMPLANT
SUT VIC AB 1 CT1 27 (SUTURE) ×6
SUT VIC AB 1 CT1 27XBRD ANBCTR (SUTURE) ×6 IMPLANT
SUT VIC AB 2-0 CT1 27 (SUTURE) ×4
SUT VIC AB 2-0 CT1 TAPERPNT 27 (SUTURE) ×4 IMPLANT
SYR CONTROL 10ML LL (SYRINGE) IMPLANT
TOWEL GREEN STERILE (TOWEL DISPOSABLE) ×1 IMPLANT
TOWEL GREEN STERILE FF (TOWEL DISPOSABLE) ×1 IMPLANT
TOWER CARTRIDGE SMART MIX (DISPOSABLE) IMPLANT
TRAY CATH 16FR W/PLASTIC CATH (SET/KITS/TRAYS/PACK) IMPLANT
TRAY FOLEY MTR SLVR 16FR STAT (SET/KITS/TRAYS/PACK) IMPLANT
WATER STERILE IRR 1000ML POUR (IV SOLUTION) ×4 IMPLANT

## 2022-08-19 NOTE — Progress Notes (Signed)
Bilateral mitts placed on patient due to patient pulling at lines and tubes.

## 2022-08-19 NOTE — Plan of Care (Signed)

## 2022-08-19 NOTE — Consult Note (Addendum)
Consultation Note Date: 08/06/2022 at 0900  Patient Name: Ana [name]  DOB: 1931-10-21  MRN: [id number]  Age / Sex: 89 y.o., female  PCP: London Pepper, MD Referring Physician: Aileen Fass, Tammi Klippel, MD  Reason for Consultation: Establishing goals of care  HPI/Patient Profile: 89 y.o. female  with past medical history of HLD, HTN, hypothyroidism, not permanent A-fib, type 2 diabetes, chronic diastolic CHF, MI (123456), vitamin D deficiency, urinary incontinence and pulmonary HTN admitted on 08/07/2022 after being found down s/p unwitnessed fall at home the night before.   In ED, patient experienced ground coffee emesis with hgb of 10.8 on admission.   Radiographic imaging reveals acute fracture of right proximal femur.    Ortho, GI, and cardiology were consulted.  Orthopedics was awaiting cardiac clearance from cardiology prior to surgical intervention.  Cardiology granted clearance once hematesis stopped. However, night of 2/26 patient became hypoxic and was placed on BiPAP.  IV Lasix were initiated.  Due to hypoxic event overnight, surgical intervention has been delayed.  In light of new GI bleed, PNA, and hip fracture, PMT was consulted due to patient's poor prognosis.  Clinical Assessment and Goals of Care: I have reviewed medical records including EPIC notes, labs and imaging, assessed the patient and then met with patient at bedside to discuss diagnosis prognosis, GOC, EOL wishes, disposition and options.  I introduced Palliative Medicine as specialized medical care for people living with serious illness. It focuses on providing relief from the symptoms and stress of a serious illness. The goal is to improve quality of life for both the patient and the family.  We discussed a brief life review of the patient.  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  She has 1 son,  Ana [name], who is her primary point of contact.  As far as functional and nutritional status patient says she was doing well living by herself.  She says she does not quite remember what happened to get her here.  But, she can remember that she was independent PTA and able to perform ADLs independently.  We discussed patient's current illness and what it means in the larger context of patient's on-going co-morbidities.  I outlined that patient has a bleed in her stomach, pneumonia in her lungs, and a fracture of her hip.  All of these acute issues in addition to her advanced age and chronic issues are creating a poor prognosis.  I outlined that patient has a femur fracture that required cardiology to give clearance for surgery.  Cardiology was able to say patient could be cleared for surgery once her bloody vomiting stopped.  However, I reviewed that the drop in her oxygen levels last night have required a delay in surgical intervention.  We discussed the use of BiPAP and high flow nasal cannula.    I attempted to elicit values and goals of care important to the patient.  Patient says she wants to do what she has to do to get better.  We discussed surgical  intervention versus comfort measures.  Patient shares she wants to have the surgery if she is physically able.  She also shares that any decisions need to be run by her son Ana [name].  Advance directives, concepts specific to code status, artificial feeding and hydration, and rehospitalization were considered and discussed.  Patient says she can make her own decisions for now but that she always wants to speak with her son Ana [name] as far as final decisions.  Patient says she is hopeful she can come off of the machine (BiPAP) and start to feel better soon.  DNR status discussed.  Patient shares she trust what ever decisions her son has made.    We also discussed the potential that if patient is unable to come off of BiPAP and is unable to maintain her  airway/oxygen saturations then there is a chance she would need to consider intubation.  We discussed intubation and use of mechanical ventilatory support.  Patient shares she does not how she feels about that would want to speak with her son Ana [name] before any decision about that is made.  Patient says that for today she wants to think about getting better and is hopeful she can have the surgery soon.  After speaking with the patient, I attempted to speak with her son Ana [name] over the phone.  No answer.  HIPAA compliant voicemail left with PMT contact information.  Addendum:  1600-RN notified me that patient's son was at bedside.  I returned to bedside.  I conveyed above discussion.  Introduced palliative medicine and my role in patient's care team.  We discussed advance care planning, CODE STATUS, and patient's comorbidities.  We discussed need for GI, cardiology, anesthesia clearance for approval to proceed with hip surgery.  We discussed pros and cons of proceeding with surgery as well as pros and cons of avoiding surgery.  As per discussions with patient earlier, patient would like to proceed with surgery since she believes it will help her feel better.  Patient's son is in agreement.  He confirmed understanding is patient's current medical situation I stating he understands her respiratory status has to become more stable in order for her to be able to survive surgery.  We discussed that in the event of a cardiopulmonary arrest that patient would not want resuscitative measures or use of mechanical kohl ventilatory support.  DNR remains.   I attempted to elicit values and goals important to patient and family.  Ana [name] shares that he would like to not have his mother be put in the corner and do nothing.  He also wants to make sure that she does not under a lot of pain and suffering in order to fix her hip problem.  We reviewed that patient's respiratory status will need to improve in order for  evaluation of surgery to happen.  Family in agreement to continue goals of care discussions.  Matt plans to contact PMT and notify us of when Hieu will be at the hospital tomorrow.  Ongoing support and discussions to continue.   Primary Decision Maker NEXT OF KIN  Physical Exam Vitals reviewed.  Constitutional:      General: She is not in acute distress.    Appearance: She is ill-appearing.  HENT:     Head: Normocephalic.     Nose: Nose normal.     Mouth/Throat:     Mouth: Mucous membranes are moist.  Eyes:     Pupils: Pupils are equal, round, and reactive to light.  Cardiovascular:  Rate and Rhythm: Normal rate. Rhythm irregular.     Pulses: Normal pulses.  Pulmonary:     Comments: bipap Abdominal:     Palpations: Abdomen is soft.  Musculoskeletal:     Comments: Generalized weakness, pain with movement of RLE d/t fracture  Skin:    General: Skin is warm and dry.  Neurological:     Mental Status: She is alert and oriented to person, place, and time.  Psychiatric:        Mood and Affect: Mood normal.        Behavior: Behavior normal.        Thought Content: Thought content normal.        Judgment: Judgment normal.     Palliative Assessment/Data: 40%     Thank you for this consult. Palliative medicine will continue to follow and assist holistically.   Time Total: 75 minutes Greater than 50%  of this time was spent counseling and coordinating care related to the above assessment and plan.  Signed by: Jordan Hawks, DNP, FNP-BC Palliative Medicine    Please contact Palliative Medicine Team phone at [phone] for questions and concerns.  For individual provider: See Shea Evans

## 2022-08-19 NOTE — Progress Notes (Signed)
SLP Cancellation Note  Patient Details Name: Ana [name] MRN: [id number] DOB: [birth year]   Cancelled treatment:       Reason Eval/Treat Not Completed: Medical issues which prohibited therapy- Pt remains on BiPAP and is not appropriate for a swallowing assessment. Will follow for readiness.  Blayn Whetsell L. Tivis Ringer, MA CCC/SLP Clinical Specialist - Acute Care SLP Acute Rehabilitation Services Office number [phone]    [name] 08/14/2022, 8:59 AM

## 2022-08-19 NOTE — Progress Notes (Signed)
Rounding Note    Patient Name: Ana [name] Date of Encounter: 08/12/2022  [place] Cardiologist: None   Subjective   Feeling OK.  Acute SOB with hypoxia overnight requiring diuresis and BiPAP.  CXR consistent with aspiration PNA.   Inpatient Medications    Scheduled Meds:  amLODipine  5 mg Oral Daily   furosemide  40 mg Intravenous Once   insulin aspart  0-15 Units Subcutaneous Q6H   levothyroxine  50 mcg Oral Daily   melatonin  3 mg Oral QHS   metoprolol tartrate  25 mg Oral BID   pantoprazole (PROTONIX) IV  40 mg Intravenous BID   Continuous Infusions:  linezolid (ZYVOX) IV 300 mL/hr at 07/30/2022 0429   meropenem (MERREM) IV Stopped (08/15/2022 0334)   PRN Meds: acetaminophen **OR** acetaminophen, guaiFENesin, haloperidol lactate, hydrALAZINE, HYDROmorphone (DILAUDID) injection, ipratropium-albuterol, metoprolol tartrate, prochlorperazine, senna-docusate   Vital Signs    Vitals:   08/12/2022 0500 08/18/2022 0655 08/15/2022 0755 07/25/2022 0919  BP: (!) 133/53 130/64 133/66 131/62  Pulse: 84 80 72 77  Resp: (!) 24 (!) 29 (!) 21 (!) 30  Temp: 98 F (36.7 C)     TempSrc: Axillary     SpO2: 95% 100% 98% 99%  Weight: 52.8 kg     Height:        Intake/Output Summary (Last 24 hours) at 08/02/2022 1007 Last data filed at 07/25/2022 0500 Gross per 24 hour  Intake 814.36 ml  Output 100 ml  Net 714.36 ml      08/11/2022    5:00 AM 08/18/2022    3:00 PM 07/27/2022   10:43 PM  Last 3 Weights  Weight (lbs) 116 lb 6.5 oz 116 lb 2.9 oz 115 lb  Weight (kg) 52.8 kg 52.7 kg 52.164 kg      Telemetry    Sinus rhythm.  PVCs. Blocked PACs. PVC - Personally Reviewed  ECG    N/a - Personally Reviewed  Physical Exam   VS:  BP 131/62   Pulse 77   Temp 98 F (36.7 C) (Axillary)   Resp (!) 30   Ht '5\' 2"'$  (1.575 m)   Wt 52.8 kg   SpO2 99%   BMI 21.29 kg/m  , BMI Body mass index is 21.29 kg/m. GENERAL:  Frail. On BiPAP HEENT: Pupils equal round and reactive,  fundi not visualized, oral mucosa unremarkable NECK:  No jugular venous distention, waveform within normal limits, carotid upstroke brisk and symmetric, no bruits, no thyromegaly LUNGS:  Rhonchi HEART:  RRR.  PMI not displaced or sustained,S1 and S2 within normal limits, no S3, no S4, no clicks, no rubs, no murmurs ABD:  Flat, positive bowel sounds normal in frequency in pitch, no bruits, no rebound, no guarding, no midline pulsatile mass, no hepatomegaly, no splenomegaly EXT:  2 plus pulses throughout, no edema, no cyanosis no clubbing SKIN:  No rashes no nodules NEURO:  Cranial nerves II through XII grossly intact, motor grossly intact throughout PSYCH:  Cognitively intact, oriented to person place and time   Labs    High Sensitivity Troponin:   Recent Labs  Lab 08/14/2022 2246 08/18/22 0243  TROPONINIHS 16 25*     Chemistry Recent Labs  Lab 07/30/2022 2246 08/18/22 1347 07/29/2022 0028 07/27/2022 0704  NA 137 140 139 139  K 4.2 4.3 4.6 4.3  CL 107 109 107 106  CO2 19* 20* 23 21*  GLUCOSE 245* 126* 132* 185*  BUN 23 24* 31* 36*  CREATININE  1.16* 1.03* 1.22* 1.36*  CALCIUM 8.7* 8.4* 8.4* 8.3*  PROT 6.9  --   --   --   ALBUMIN 2.8*  --   --   --   AST 39  --   --   --   ALT 15  --   --   --   ALKPHOS 69  --   --   --   BILITOT 0.9  --   --   --   GFRNONAA 45* 52* 43* 37*  ANIONGAP '11 11 9 12    '$ Lipids No results for input(s): "CHOL", "TRIG", "HDL", "LABVLDL", "LDLCALC", "CHOLHDL" in the last 168 hours.  Hematology Recent Labs  Lab 08/14/2022 2246 08/18/22 1347 08/18/22 1859 08/18/22 2128 08/11/2022 0704  WBC 10.6*  --  20.8*  --  21.3*  RBC 3.51*  --  3.25*  --  3.33*  HGB 10.8*   < > 9.6* 9.7* 10.1*  HCT 32.0*   < > 29.8* 28.7* 30.1*  MCV 91.2  --  91.7  --  90.4  MCH 30.8  --  29.5  --  30.3  MCHC 33.8  --  32.2  --  33.6  RDW 13.4  --  13.7  --  13.7  PLT 214  --  207  --  207   < > = values in this interval not displayed.   Thyroid No results for input(s):  "TSH", "FREET4" in the last 168 hours.  BNP Recent Labs  Lab 08/10/2022 2246 08/18/22 1606  BNP 688.1* 869.0*    DDimer No results for input(s): "DDIMER" in the last 168 hours.   Radiology    DG CHEST PORT 1 VIEW  Result Date: 08/09/2022 CLINICAL DATA:  Hypoxia. EXAM: PORTABLE CHEST 1 VIEW COMPARISON:  08/12/2022. FINDINGS: The heart is enlarged and the mediastinal contour stable. There is atherosclerotic calcification of the aorta. Consolidation is noted in the right upper lobe. Perihilar airspace disease is present bilaterally. There is a small left pleural effusion. No pneumothorax. Degenerative changes are noted in the thoracic spine. No acute osseous abnormality. IMPRESSION: 1. Right upper lobe consolidation with perihilar airspace disease bilaterally, concerning for multifocal pneumonia and/or superimposed edema. 2. Small left pleural effusion. 3. Cardiomegaly. Electronically Signed   By: Brett Fairy M.D.   On: 08/13/2022 00:17   DG Knee Complete 4 Views Left  Result Date: 08/18/2022 CLINICAL DATA:  Recent fall with left knee discomfort and bruising EXAM: LEFT KNEE - COMPLETE 4+ VIEW COMPARISON:  10/11/2013 FINDINGS: No visible knee joint effusion. Medial compartment joint space narrowing and osteophyte formation consistent with osteoarthritis. Bony irregularity of the posterior margin of the patella consistent with chondromalacia patella. No acute traumatic finding. Extensive regional arterial calcification is present. IMPRESSION: No acute or traumatic finding. Medial compartment osteoarthritis. Chondromalacia patella. Extensive regional arterial calcification. Electronically Signed   By: Nelson Chimes M.D.   On: 08/18/2022 10:18   CT ANGIO GI BLEED  Result Date: 08/18/2022 CLINICAL DATA:  Coffee ground emesis. Fell yesterday with acute proximal right femoral fracture. EXAM: CTA ABDOMEN AND PELVIS WITHOUT AND WITH CONTRAST TECHNIQUE: Multidetector CT imaging of the abdomen and pelvis was  performed using the standard protocol during bolus administration of intravenous contrast. Multiplanar reconstructed images and MIPs were obtained and reviewed to evaluate the vascular anatomy. RADIATION DOSE REDUCTION: This exam was performed according to the departmental dose-optimization program which includes automated exposure control, adjustment of the mA and/or kV according to patient size and/or use of  iterative reconstruction technique. CONTRAST:  21m OMNIPAQUE IOHEXOL 350 MG/ML SOLN COMPARISON:  AP pelvis and femoral x-rays yesterday. CT abdomen pelvis with contrast 09/29/17, CTA chest, abdomen and pelvis 01/14/2014. FINDINGS: VASCULAR Aorta: There are moderate calcific plaques progressive from 2015 without aneurysm, dissection or critical stenosis. Celiac: Progress ostial calcific plaques with 75% vessel origin stenosis, otherwise opacifies well but with branch vessel atherosclerosis also seen. SMA: There are calcific plaques proximally with up to 40% stenosis in the proximal 1 cm. There is no other flow-limiting stenosis. No branch occlusion. Renals: Both renal arteries are patent without evidence of aneurysm, dissection, vasculitis, fibromuscular dysplasia or significant stenosis. IMA: Severe calcific origin stenosis. The vessel otherwise opacifies well. No branch occlusion. Inflow: Moderate patchy calcific plaques. No flow-limiting inflow stenosis. Proximal Outflow: There are calcific plaques heaviest in the superficial femoral arteries with at least 60% stenosis in the visualized portions. Veins: Patent. Review of the MIP images confirms the above findings. NON-VASCULAR Lower chest: Mild cardiomegaly. There is calcification in the coronary arteries and mitral ring. Minimal pericardial effusion. There are moderate layering pleural effusions. There is patchy ground-glass disease in the lung bases which could be due to edema or pneumonia . Hepatobiliary: Respiratory motion limits fine detail. There is no  obvious liver mass. Gallbladder and bile ducts are unremarkable, as visualized. Pancreas: Atrophic and otherwise unremarkable. Spleen: Unremarkable. Adrenals/Urinary Tract: There is no adrenal mass. No renal mass is seen through the motion artifact. There is urothelial thickening and stranding involving the left renal pelvis and proximal ureter concerning for ascending UTI, with mild thickening of the bladder and perivesical stranding compatible with cystitis. No urinary stone or obstruction is seen. Stomach/Bowel: The stomach distended with air and fluid and has a normal wall thickness. The small bowel is decompressed. There is wall thickening or underdistention in the distal ascending, transverse and descending colon. Large stool impaction in the rectum and possible stercoral proctitis. No contrast extravasation into the stomach or bowel is seen. An appendix is not seen in this patient. Lymphatic: There is body wall anasarca and generalized mesenteric congestive changes. Reproductive: Uterus and bilateral adnexa are unremarkable. Other: Small amount of scattered abdominal and pelvic free ascites. No drainable pocket. No free air. There is no incarcerated hernia. Musculoskeletal: Osteopenia, levoscoliosis and degenerative change lumbar spine. Transverse midcervical proximal right femoral fracture is again noted with impaction and mild cephalad displacement. IMPRESSION: 1. Aortic and branch vessel atherosclerosis. 2. 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 3. No contrast extravasation into the bowel is seen. 4. Moderate pleural effusions with patchy ground-glass disease in the lung bases which could be due to edema or pneumonia. 5. Cardiomegaly with calcific CAD. 6. Body wall anasarca, mesenteric congestion and small amount of scattered abdominal and pelvic free ascites. 7. Probable cystitis and likely left-sided ascending UTI. 8. Large stool impaction  in the rectum with possible stercoral proctitis. 9. Colitis versus nondistention of the distal ascending, transverse and descending colon. 10. Distended stomach with air and fluid. No small bowel dilatation. 11. Transverse midcervical proximal right femoral fracture with impaction and mild cephalad displacement. 12. Osteopenia and degenerative change. Aortic Atherosclerosis (ICD10-I70.0). Electronically Signed   By: KTelford NabM.D.   On: 08/18/2022 05:02   DG Femur Portable 1 View Left  Result Date: 08/18/2022 CLINICAL DATA:  Trauma, fall. EXAM: LEFT FEMUR PORTABLE 1 VIEW COMPARISON:  Fall pelvis radiograph earlier today. FINDINGS: Divided frontal views of the left femur obtained.  Cortical margins of the left femur are intact. No left femur fracture on this single view. Knee alignment is maintained. The included pubic rami are intact. Prominent vascular calcifications. IMPRESSION: 1. No left femur fracture on divided frontal view. 2. Prominent vascular calcifications. Electronically Signed   By: Keith Rake M.D.   On: 08/11/2022 23:52   CT HEAD WO CONTRAST  Result Date: 08/02/2022 CLINICAL DATA:  Head trauma EXAM: CT HEAD WITHOUT CONTRAST CT CERVICAL SPINE WITHOUT CONTRAST TECHNIQUE: Multidetector CT imaging of the head and cervical spine was performed following the standard protocol without intravenous contrast. Multiplanar CT image reconstructions of the cervical spine were also generated. RADIATION DOSE REDUCTION: This exam was performed according to the departmental dose-optimization program which includes automated exposure control, adjustment of the mA and/or kV according to patient size and/or use of iterative reconstruction technique. COMPARISON:  None Available. FINDINGS: CT HEAD FINDINGS Brain: There is no mass, hemorrhage or extra-axial collection. The size and configuration of the ventricles and extra-axial CSF spaces are normal. There is hypoattenuation of the periventricular white  matter, most commonly indicating chronic ischemic microangiopathy. Vascular: Atherosclerotic calcification of the vertebral and internal carotid arteries at the skull base. No abnormal hyperdensity of the major intracranial arteries or dural venous sinuses. Skull: The visualized skull base, calvarium and extracranial soft tissues are normal. Sinuses/Orbits: No fluid levels or advanced mucosal thickening of the visualized paranasal sinuses. No mastoid or middle ear effusion. The orbits are normal. CT CERVICAL SPINE FINDINGS Alignment: No static subluxation. Facets are aligned. Occipital condyles are normally positioned. Skull base and vertebrae: No acute fracture. Soft tissues and spinal canal: No prevertebral fluid or swelling. No visible canal hematoma. Disc levels: No advanced spinal canal or neural foraminal stenosis. Upper chest: Biapical emphysema Other: Normal visualized paraspinal cervical soft tissues. IMPRESSION: 1. No acute intracranial abnormality. 2. Chronic ischemic microangiopathy. 3. No acute fracture or static subluxation of the cervical spine. Electronically Signed   By: Ulyses Jarred M.D.   On: 08/18/2022 23:30   CT CERVICAL SPINE WO CONTRAST  Result Date: 08/15/2022 CLINICAL DATA:  Head trauma EXAM: CT HEAD WITHOUT CONTRAST CT CERVICAL SPINE WITHOUT CONTRAST TECHNIQUE: Multidetector CT imaging of the head and cervical spine was performed following the standard protocol without intravenous contrast. Multiplanar CT image reconstructions of the cervical spine were also generated. RADIATION DOSE REDUCTION: This exam was performed according to the departmental dose-optimization program which includes automated exposure control, adjustment of the mA and/or kV according to patient size and/or use of iterative reconstruction technique. COMPARISON:  None Available. FINDINGS: CT HEAD FINDINGS Brain: There is no mass, hemorrhage or extra-axial collection. The size and configuration of the ventricles and  extra-axial CSF spaces are normal. There is hypoattenuation of the periventricular white matter, most commonly indicating chronic ischemic microangiopathy. Vascular: Atherosclerotic calcification of the vertebral and internal carotid arteries at the skull base. No abnormal hyperdensity of the major intracranial arteries or dural venous sinuses. Skull: The visualized skull base, calvarium and extracranial soft tissues are normal. Sinuses/Orbits: No fluid levels or advanced mucosal thickening of the visualized paranasal sinuses. No mastoid or middle ear effusion. The orbits are normal. CT CERVICAL SPINE FINDINGS Alignment: No static subluxation. Facets are aligned. Occipital condyles are normally positioned. Skull base and vertebrae: No acute fracture. Soft tissues and spinal canal: No prevertebral fluid or swelling. No visible canal hematoma. Disc levels: No advanced spinal canal or neural foraminal stenosis. Upper chest: Biapical emphysema Other: Normal visualized paraspinal cervical soft tissues. IMPRESSION:  1. No acute intracranial abnormality. 2. Chronic ischemic microangiopathy. 3. No acute fracture or static subluxation of the cervical spine. Electronically Signed   By: Ulyses Jarred M.D.   On: 08/11/2022 23:30   DG Pelvis Portable  Result Date: 08/01/2022 CLINICAL DATA:  Status post fall. EXAM: PORTABLE PELVIS 1-2 VIEWS COMPARISON:  None Available. FINDINGS: Acute fracture deformity is seen extending through the neck of the proximal right femur. There is no evidence of dislocation. A chronic appearing deformity is suspected along the lateral aspect of the left iliac crest. Degenerative changes are seen involving both hips, as well as the visualized portion of the lower lumbar spine. Soft tissue structures are unremarkable. IMPRESSION: Acute fracture of the proximal right femur. Electronically Signed   By: Virgina Norfolk M.D.   On: 08/10/2022 23:13   DG FEMUR PORT, 1V RIGHT  Result Date:  08/09/2022 CLINICAL DATA:  Status post fall. EXAM: RIGHT FEMUR PORTABLE 1 VIEW COMPARISON:  None Available. FINDINGS: An acute fracture deformity is seen extending through the neck of the proximal right femur. Approximately 1/2 shaft width dorsal displacement of the distal fracture site is noted. There is no evidence of dislocation. Moderate severity vascular calcification is noted. IMPRESSION: Acute fracture of the proximal right femur. Electronically Signed   By: Virgina Norfolk M.D.   On: 08/16/2022 23:11   DG Chest Port 1 View  Result Date: 08/20/2022 CLINICAL DATA:  Status post fall. EXAM: PORTABLE CHEST 1 VIEW COMPARISON:  February 19, 2022 FINDINGS: The cardiac silhouette is mildly enlarged and unchanged in size. There is marked severity calcification of the aortic arch. Diffuse, chronic appearing increased interstitial lung markings are seen. Mild atelectatic changes are present within the bilateral lung bases. There is no evidence of a pleural effusion or pneumothorax. Multilevel degenerative changes seen throughout the thoracic spine. IMPRESSION: Chronic appearing increased interstitial lung markings with mild bibasilar atelectasis. Electronically Signed   By: Virgina Norfolk M.D.   On: 07/31/2022 23:09   DG Knee Right Port  Result Date: 08/10/2022 CLINICAL DATA:  Status post fall. EXAM: PORTABLE RIGHT KNEE - 1-2 VIEW COMPARISON:  None Available. FINDINGS: No evidence of acute fracture, dislocation, or joint effusion. There is marked severity patellofemoral and medial tibiofemoral compartment space narrowing. Moderate severity vascular calcification is seen. IMPRESSION: Marked severity degenerative changes without an acute osseous abnormality. Electronically Signed   By: Virgina Norfolk M.D.   On: 08/16/2022 23:07    Cardiac Studies   Echo pending  Patient Profile     Ms. Giacomo is an 36F with HFpEF, hypertension, hyperlipidemia, diabetes, atrial fibrillation/flutter, and chronic anemia  admitted after being found down and noted to have a R hip fracture and upper GI bleed. Cardiology consulted for elevated troponin and pre-op evaluation.   Assessment & Plan    # Acute hypoxic respiratory failure:  Seems to be mostly aspiration PNA in the setting of hematemesis and being found down.  BNP also mildly elevated.  Now on antibiotics per primary team and received IV lasix.  Must be stabilized prior to surgery.   # Pre-op evaluation:  # R hip fracture:  Patient walks with walker at baseline.  She does in home exercises, cooking and cleaning without chest pain.  She does have dyspnea if she exerts herself or moves too fast.  She appears to be euvolemic on exam. She as at moderate risk given her age and co-morbidities.  Now high risk due to her respiratory compromise.  She is not a  candidate for cath given her GI bleed, and an ischemic evaluation would not change management.  Recommend proceeding with surgery once stabilized.  H/H stable this am.  Echo pending.   # HFpEF:  Repeat BNP from yesterday which still has not resulted.  She seems compensated from a HF standpoint.    # PAF/flutter; Currently sinus tachycardia.  INR was supra therapeutic.  Warfarin on hold and she received vitamin K.  Continue low dose metoprolol.     # HTN:  Continue to hold home losartan and amlodipine 2/2 low BP.  Metoprolol as above.        For questions or updates, please contact [place] Please consult [email or web address] for contact info under        Signed, Skeet Latch, MD  07/27/2022, 10:07 AM

## 2022-08-19 NOTE — Progress Notes (Signed)
Patient seen and examined Currently vital signs stable with 99% saturation on 7 L O2 nasal cannula Mild to moderate pain with right hip motion.  Bilateral upper extremities have reasonable passive and active range of motion at the wrist shoulder and elbow No effusion in either knee. Plan at this time is right hip hemiarthroplasty once patient is medically stabilized. Aspiration pneumonia present but patient is on antibiotics. INR 1.6 this morning but is trending lower. I agree with Dr. Oval Linsey that her risk is high due to medical comorbidities but her risk of not having surgery is even higher which would leave her essentially in a bedridden state. As her INR decreases the opportunity for spinal anesthetic becomes more likely which could mitigate the effects of general anesthetic on her respiratory function. Please let me know when she is medically optimized from a respiratory standpoint and can proceed with surgical intervention for her hip fracture.

## 2022-08-19 NOTE — Progress Notes (Signed)
Patient's echo reviewed.  Her systolic function is normal and there is no evidence of wall motion abnormality.  No plans for ischemic evaluation.  She is high risk for surgery due to age, frailty, GI bleed, hypoxic respiratory failure, and aspiration pneumonia.  However, her non-operative mortality is even higher.  No plans for any further cardiac intervention.  Recommend proceeding with surgery when her primary team and anesthesia feel that she can tolerate it from a respiratory standpoint.   Mihailo Sage C. Oval Linsey, MD, [facility] 08/18/2022 4:19 PM

## 2022-08-19 NOTE — Progress Notes (Addendum)
[facility] PROGRESS NOTE    Progress Note  Ana [name]  [id number] DOB: May 30, 1932 DOA: 08/06/2022 PCP: London Pepper, MD     Brief Narrative:   Ana [name] is an 89 y.o. female past medical history of diabetes mellitus, essential hypertension hypothyroidism, permanent atrial fibrillation on Coumadin and pulmonary hypertension who family members found her on the floor brought into the ED while in the ED developed coffee-ground emesis, with a supratherapeutic INR of 3, FOBT positive hemoglobin of 11, was complaining of right hip pain x-ray showed acute fracture of the right proximal femur, x-ray of the knee showed degenerative changes, CT of the head showed no acute findings chronic ischemic microangiopathy, C-spine showed no fracture or dislocation  Assessment/Plan:   Acute respiratory failure with hypoxia possible aspiration pneumonia: Overnight became hypoxic overnight, was placed on BiPAP and was started on IV Lasix I's and O's and poorly recorded. Still on BiPAP satting greater than 90%.  Try to wean her to heated high flow oxygen, 2 hours after repeating IV Lasix. Use Haldol IV as needed for agitation Antibiotic coverage was broadened to meropenem and linezolid. Unfortunately blood cultures were not sent on admission. She has remained afebrile.  Leukocytosis has jumped. She is at very high risk of reaspiration, agitation and delirium. I did talk to the son he is agreeable to meeting with palliative care. I am concerned she might be developing ARDS.  Femur fracture, right (HCC) Became hypoxic overnight we will have to delay surgical intervention. Continue narcotics and Robaxin. Cardiology was consulted who recommended to proceeding with surgical intervention when his hematemesis has been resolved.  They also recommended a 2D echo.  Coffee-ground emesis/upper GI bleed/symptomatic anemia: With a previous hemoglobin of 10.2, on admission 10.8. Continue IV Protonix,  status post FFP and vitamin K, Hold aspirin. INR this morning is 1.6. GI has been consulted [facility] was consulted agree with PPI monitor hemoglobin, hold Coumadin. They recommended optimization from a cardiopulmonary standpoint, we before proceeding with intervention.   2D echo is pending.  Persistent atrial fibrillation ([place]): Coumadin held, given FFP and vitamin K. Continue metoprolol for heart rate greater than 100. INR this morning is 1.6.  Chronic diastolic CHF (congestive heart failure) ([place]) She appears euvolemic on physical exam no JVD, negative hepatojugular reflux no lower extremity edema. Was given IV Lasix overnight she became short of breath, I's and O's and poorly recorded. She appears euvolemic this morning on physical exam. 2D echo is pending.  Essential hypertension: Bp with stable. Started on Norvasc her blood pressure significantly elevated.    DVT prophylaxis: SCD's Family Communication:Son Status is: Inpatient Remains inpatient appropriate because: acute gi bleed    Code Status:     Code Status Orders  (From admission, onward)           Start     Ordered   08/18/22 0630  Do not attempt resuscitation (DNR)  Continuous       Question Answer Comment  If patient has no pulse and is not breathing Do Not Attempt Resuscitation   If patient has a pulse and/or is breathing: Medical Treatment Goals COMFORT MEASURES: Keep clean/warm/dry, use medication by any route; positioning, wound care and other measures to relieve pain/suffering; use oxygen, suction/manual treatment of airway obstruction for comfort; do not transfer unless for comfort needs.   Consent: Discussion documented in EHR or advanced directives reviewed      08/18/22 0630  Code Status History     Date Active Date Inactive Code Status Order ID Comments User Context   08/18/2022 0629 08/18/2022 0630 DNR [id number]  Quintella Baton, MD ED   01/19/2014 1013 01/20/2014 2050 Full Code  [id number]  Leonie Man, MD Inpatient   01/17/2014 1810 01/19/2014 1013 Full Code [id number]  Troy Sine, MD Inpatient   01/14/2014 1539 01/17/2014 1810 Full Code [id number]  Darlin Coco, MD Inpatient         IV Access:   Peripheral IV   Procedures and diagnostic studies:   DG CHEST PORT 1 VIEW  Result Date: 08/18/2022 CLINICAL DATA:  Hypoxia. EXAM: PORTABLE CHEST 1 VIEW COMPARISON:  08/18/2022. FINDINGS: The heart is enlarged and the mediastinal contour stable. There is atherosclerotic calcification of the aorta. Consolidation is noted in the right upper lobe. Perihilar airspace disease is present bilaterally. There is a small left pleural effusion. No pneumothorax. Degenerative changes are noted in the thoracic spine. No acute osseous abnormality. IMPRESSION: 1. Right upper lobe consolidation with perihilar airspace disease bilaterally, concerning for multifocal pneumonia and/or superimposed edema. 2. Small left pleural effusion. 3. Cardiomegaly. Electronically Signed   By: Brett Fairy M.D.   On: 08/20/2022 00:17   DG Knee Complete 4 Views Left  Result Date: 08/18/2022 CLINICAL DATA:  Recent fall with left knee discomfort and bruising EXAM: LEFT KNEE - COMPLETE 4+ VIEW COMPARISON:  10/11/2013 FINDINGS: No visible knee joint effusion. Medial compartment joint space narrowing and osteophyte formation consistent with osteoarthritis. Bony irregularity of the posterior margin of the patella consistent with chondromalacia patella. No acute traumatic finding. Extensive regional arterial calcification is present. IMPRESSION: No acute or traumatic finding. Medial compartment osteoarthritis. Chondromalacia patella. Extensive regional arterial calcification. Electronically Signed   By: Nelson Chimes M.D.   On: 08/18/2022 10:18   CT ANGIO GI BLEED  Result Date: 08/18/2022 CLINICAL DATA:  Coffee ground emesis. Fell yesterday with acute proximal right femoral fracture. EXAM: CTA ABDOMEN AND  PELVIS WITHOUT AND WITH CONTRAST TECHNIQUE: Multidetector CT imaging of the abdomen and pelvis was performed using the standard protocol during bolus administration of intravenous contrast. Multiplanar reconstructed images and MIPs were obtained and reviewed to evaluate the vascular anatomy. RADIATION DOSE REDUCTION: This exam was performed according to the departmental dose-optimization program which includes automated exposure control, adjustment of the mA and/or kV according to patient size and/or use of iterative reconstruction technique. CONTRAST:  72m OMNIPAQUE IOHEXOL 350 MG/ML SOLN COMPARISON:  AP pelvis and femoral x-rays yesterday. CT abdomen pelvis with contrast 09/29/17, CTA chest, abdomen and pelvis 01/14/2014. FINDINGS: VASCULAR Aorta: There are moderate calcific plaques progressive from 2015 without aneurysm, dissection or critical stenosis. Celiac: Progress ostial calcific plaques with 75% vessel origin stenosis, otherwise opacifies well but with branch vessel atherosclerosis also seen. SMA: There are calcific plaques proximally with up to 40% stenosis in the proximal 1 cm. There is no other flow-limiting stenosis. No branch occlusion. Renals: Both renal arteries are patent without evidence of aneurysm, dissection, vasculitis, fibromuscular dysplasia or significant stenosis. IMA: Severe calcific origin stenosis. The vessel otherwise opacifies well. No branch occlusion. Inflow: Moderate patchy calcific plaques. No flow-limiting inflow stenosis. Proximal Outflow: There are calcific plaques heaviest in the superficial femoral arteries with at least 60% stenosis in the visualized portions. Veins: Patent. Review of the MIP images confirms the above findings. NON-VASCULAR Lower chest: Mild cardiomegaly. There is calcification in the coronary arteries and mitral ring. Minimal pericardial effusion. There are moderate layering pleural effusions.  There is patchy ground-glass disease in the lung bases which  could be due to edema or pneumonia . Hepatobiliary: Respiratory motion limits fine detail. There is no obvious liver mass. Gallbladder and bile ducts are unremarkable, as visualized. Pancreas: Atrophic and otherwise unremarkable. Spleen: Unremarkable. Adrenals/Urinary Tract: There is no adrenal mass. No renal mass is seen through the motion artifact. There is urothelial thickening and stranding involving the left renal pelvis and proximal ureter concerning for ascending UTI, with mild thickening of the bladder and perivesical stranding compatible with cystitis. No urinary stone or obstruction is seen. Stomach/Bowel: The stomach distended with air and fluid and has a normal wall thickness. The small bowel is decompressed. There is wall thickening or underdistention in the distal ascending, transverse and descending colon. Large stool impaction in the rectum and possible stercoral proctitis. No contrast extravasation into the stomach or bowel is seen. An appendix is not seen in this patient. Lymphatic: There is body wall anasarca and generalized mesenteric congestive changes. Reproductive: Uterus and bilateral adnexa are unremarkable. Other: Small amount of scattered abdominal and pelvic free ascites. No drainable pocket. No free air. There is no incarcerated hernia. Musculoskeletal: Osteopenia, levoscoliosis and degenerative change lumbar spine. Transverse midcervical proximal right femoral fracture is again noted with impaction and mild cephalad displacement. IMPRESSION: 1. Aortic and branch vessel atherosclerosis. 2. 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 3. No contrast extravasation into the bowel is seen. 4. Moderate pleural effusions with patchy ground-glass disease in the lung bases which could be due to edema or pneumonia. 5. Cardiomegaly with calcific CAD. 6. Body wall anasarca, mesenteric congestion and small amount of scattered abdominal and  pelvic free ascites. 7. Probable cystitis and likely left-sided ascending UTI. 8. Large stool impaction in the rectum with possible stercoral proctitis. 9. Colitis versus nondistention of the distal ascending, transverse and descending colon. 10. Distended stomach with air and fluid. No small bowel dilatation. 11. Transverse midcervical proximal right femoral fracture with impaction and mild cephalad displacement. 12. Osteopenia and degenerative change. Aortic Atherosclerosis (ICD10-I70.0). Electronically Signed   By: Telford Nab M.D.   On: 08/18/2022 05:02   DG Femur Portable 1 View Left  Result Date: 08/05/2022 CLINICAL DATA:  Trauma, fall. EXAM: LEFT FEMUR PORTABLE 1 VIEW COMPARISON:  Fall pelvis radiograph earlier today. FINDINGS: Divided frontal views of the left femur obtained. Cortical margins of the left femur are intact. No left femur fracture on this single view. Knee alignment is maintained. The included pubic rami are intact. Prominent vascular calcifications. IMPRESSION: 1. No left femur fracture on divided frontal view. 2. Prominent vascular calcifications. Electronically Signed   By: Keith Rake M.D.   On: 08/18/2022 23:52   CT HEAD WO CONTRAST  Result Date: 08/15/2022 CLINICAL DATA:  Head trauma EXAM: CT HEAD WITHOUT CONTRAST CT CERVICAL SPINE WITHOUT CONTRAST TECHNIQUE: Multidetector CT imaging of the head and cervical spine was performed following the standard protocol without intravenous contrast. Multiplanar CT image reconstructions of the cervical spine were also generated. RADIATION DOSE REDUCTION: This exam was performed according to the departmental dose-optimization program which includes automated exposure control, adjustment of the mA and/or kV according to patient size and/or use of iterative reconstruction technique. COMPARISON:  None Available. FINDINGS: CT HEAD FINDINGS Brain: There is no mass, hemorrhage or extra-axial collection. The size and configuration of the  ventricles and extra-axial CSF spaces are normal. There is hypoattenuation of the periventricular white matter, most commonly indicating  chronic ischemic microangiopathy. Vascular: Atherosclerotic calcification of the vertebral and internal carotid arteries at the skull base. No abnormal hyperdensity of the major intracranial arteries or dural venous sinuses. Skull: The visualized skull base, calvarium and extracranial soft tissues are normal. Sinuses/Orbits: No fluid levels or advanced mucosal thickening of the visualized paranasal sinuses. No mastoid or middle ear effusion. The orbits are normal. CT CERVICAL SPINE FINDINGS Alignment: No static subluxation. Facets are aligned. Occipital condyles are normally positioned. Skull base and vertebrae: No acute fracture. Soft tissues and spinal canal: No prevertebral fluid or swelling. No visible canal hematoma. Disc levels: No advanced spinal canal or neural foraminal stenosis. Upper chest: Biapical emphysema Other: Normal visualized paraspinal cervical soft tissues. IMPRESSION: 1. No acute intracranial abnormality. 2. Chronic ischemic microangiopathy. 3. No acute fracture or static subluxation of the cervical spine. Electronically Signed   By: Ulyses Jarred M.D.   On: 08/18/2022 23:30   CT CERVICAL SPINE WO CONTRAST  Result Date: 08/18/2022 CLINICAL DATA:  Head trauma EXAM: CT HEAD WITHOUT CONTRAST CT CERVICAL SPINE WITHOUT CONTRAST TECHNIQUE: Multidetector CT imaging of the head and cervical spine was performed following the standard protocol without intravenous contrast. Multiplanar CT image reconstructions of the cervical spine were also generated. RADIATION DOSE REDUCTION: This exam was performed according to the departmental dose-optimization program which includes automated exposure control, adjustment of the mA and/or kV according to patient size and/or use of iterative reconstruction technique. COMPARISON:  None Available. FINDINGS: CT HEAD FINDINGS Brain:  There is no mass, hemorrhage or extra-axial collection. The size and configuration of the ventricles and extra-axial CSF spaces are normal. There is hypoattenuation of the periventricular white matter, most commonly indicating chronic ischemic microangiopathy. Vascular: Atherosclerotic calcification of the vertebral and internal carotid arteries at the skull base. No abnormal hyperdensity of the major intracranial arteries or dural venous sinuses. Skull: The visualized skull base, calvarium and extracranial soft tissues are normal. Sinuses/Orbits: No fluid levels or advanced mucosal thickening of the visualized paranasal sinuses. No mastoid or middle ear effusion. The orbits are normal. CT CERVICAL SPINE FINDINGS Alignment: No static subluxation. Facets are aligned. Occipital condyles are normally positioned. Skull base and vertebrae: No acute fracture. Soft tissues and spinal canal: No prevertebral fluid or swelling. No visible canal hematoma. Disc levels: No advanced spinal canal or neural foraminal stenosis. Upper chest: Biapical emphysema Other: Normal visualized paraspinal cervical soft tissues. IMPRESSION: 1. No acute intracranial abnormality. 2. Chronic ischemic microangiopathy. 3. No acute fracture or static subluxation of the cervical spine. Electronically Signed   By: Ulyses Jarred M.D.   On: 08/12/2022 23:30   DG Pelvis Portable  Result Date: 08/14/2022 CLINICAL DATA:  Status post fall. EXAM: PORTABLE PELVIS 1-2 VIEWS COMPARISON:  None Available. FINDINGS: Acute fracture deformity is seen extending through the neck of the proximal right femur. There is no evidence of dislocation. A chronic appearing deformity is suspected along the lateral aspect of the left iliac crest. Degenerative changes are seen involving both hips, as well as the visualized portion of the lower lumbar spine. Soft tissue structures are unremarkable. IMPRESSION: Acute fracture of the proximal right femur. Electronically Signed    By: Virgina Norfolk M.D.   On: 08/02/2022 23:13   DG FEMUR PORT, 1V RIGHT  Result Date: 08/12/2022 CLINICAL DATA:  Status post fall. EXAM: RIGHT FEMUR PORTABLE 1 VIEW COMPARISON:  None Available. FINDINGS: An acute fracture deformity is seen extending through the neck of the proximal right femur. Approximately 1/2 shaft width dorsal  displacement of the distal fracture site is noted. There is no evidence of dislocation. Moderate severity vascular calcification is noted. IMPRESSION: Acute fracture of the proximal right femur. Electronically Signed   By: Virgina Norfolk M.D.   On: 08/11/2022 23:11   DG Chest Port 1 View  Result Date: 07/24/2022 CLINICAL DATA:  Status post fall. EXAM: PORTABLE CHEST 1 VIEW COMPARISON:  February 19, 2022 FINDINGS: The cardiac silhouette is mildly enlarged and unchanged in size. There is marked severity calcification of the aortic arch. Diffuse, chronic appearing increased interstitial lung markings are seen. Mild atelectatic changes are present within the bilateral lung bases. There is no evidence of a pleural effusion or pneumothorax. Multilevel degenerative changes seen throughout the thoracic spine. IMPRESSION: Chronic appearing increased interstitial lung markings with mild bibasilar atelectasis. Electronically Signed   By: Virgina Norfolk M.D.   On: 08/06/2022 23:09   DG Knee Right Port  Result Date: 08/11/2022 CLINICAL DATA:  Status post fall. EXAM: PORTABLE RIGHT KNEE - 1-2 VIEW COMPARISON:  None Available. FINDINGS: No evidence of acute fracture, dislocation, or joint effusion. There is marked severity patellofemoral and medial tibiofemoral compartment space narrowing. Moderate severity vascular calcification is seen. IMPRESSION: Marked severity degenerative changes without an acute osseous abnormality. Electronically Signed   By: Virgina Norfolk M.D.   On: 08/08/2022 23:07     Medical Consultants:   None.   Subjective:    DUANNE KOVER relates she is  short of breath.  Objective:    Vitals:   08/06/2022 0141 08/05/2022 0155 07/26/2022 0240 08/20/2022 0500  BP:    (!) 133/53  Pulse: (!) 52 61 74 84  Resp: (!) 25 18 (!) 23 (!) 24  Temp:    98 F (36.7 C)  TempSrc:    Axillary  SpO2: 96% 98% 94% 95%  Weight:    52.8 kg  Height:       SpO2: 95 % O2 Flow Rate (L/min): 12 L/min FiO2 (%): 50 %   Intake/Output Summary (Last 24 hours) at 08/14/2022 [id number] Last data filed at 07/25/2022 0500 Gross per 24 hour  Intake 814.36 ml  Output 100 ml  Net 714.36 ml   Filed Weights   08/10/2022 2243 08/18/22 1500 08/09/2022 0500  Weight: 52.2 kg 52.7 kg 52.8 kg    Exam: General exam: In no acute distress. Respiratory system: Good air movement and clear to auscultation. Cardiovascular system: S1 & S2 heard, RRR. No JVD. Gastrointestinal system: Abdomen is nondistended, soft and nontender.  Extremities: No pedal edema. Skin: No rashes, lesions or ulcers Psychiatry: Judgement and insight appear normal. Mood & affect appropriate.   Data Reviewed:    Labs: Basic Metabolic Panel: Recent Labs  Lab 07/29/2022 2245 08/09/2022 2246 08/18/22 1347 07/31/2022 0028  NA 139 137 140 139  K 4.2 4.2 4.3 4.6  CL 108 107 109 107  CO2  --  19* 20* 23  GLUCOSE 237* 245* 126* 132*  BUN 24* 23 24* 31*  CREATININE 0.90 1.16* 1.03* 1.22*  CALCIUM  --  8.7* 8.4* 8.4*    GFR Estimated Creatinine Clearance: 25.2 mL/min (A) (by C-G formula based on SCr of 1.22 mg/dL (H)). Liver Function Tests: Recent Labs  Lab 08/09/2022 2246  AST 39  ALT 15  ALKPHOS 69  BILITOT 0.9  PROT 6.9  ALBUMIN 2.8*    No results for input(s): "LIPASE", "AMYLASE" in the last 168 hours. No results for input(s): "AMMONIA" in the last 168 hours. Coagulation profile Recent  Labs  Lab 07/25/2022 2246 07/24/2022 0028  INR 3.0* 1.6*    COVID-19 Labs  No results for input(s): "DDIMER", "FERRITIN", "LDH", "CRP" in the last 72 hours.  No results found for: "SARSCOV2NAA"  CBC: Recent  Labs  Lab 07/25/2022 2246 08/18/22 1347 08/18/22 1606 08/18/22 1859 08/18/22 2128  WBC 10.6*  --   --  20.8*  --   HGB 10.8* 9.5* 9.2* 9.6* 9.7*  HCT 32.0* 28.9* 27.6* 29.8* 28.7*  MCV 91.2  --   --  91.7  --   PLT 214  --   --  207  --     Cardiac Enzymes: Recent Labs  Lab 08/03/2022 2246 08/03/2022 0028  CKTOTAL 379* 670*    BNP (last 3 results) Recent Labs    02/19/22 1456  PROBNP 1,947*    CBG: Recent Labs  Lab 08/18/22 0651 08/18/22 1458 08/18/22 1813 07/27/2022 0009  GLUCAP 166* 116* 120* 112*    D-Dimer: No results for input(s): "DDIMER" in the last 72 hours. Hgb A1c: Recent Labs    08/18/22 0720  HGBA1C 6.1*   Lipid Profile: No results for input(s): "CHOL", "HDL", "LDLCALC", "TRIG", "CHOLHDL", "LDLDIRECT" in the last 72 hours. Thyroid function studies: No results for input(s): "TSH", "T4TOTAL", "T3FREE", "THYROIDAB" in the last 72 hours.  Invalid input(s): "FREET3" Anemia work up: No results for input(s): "VITAMINB12", "FOLATE", "FERRITIN", "TIBC", "IRON", "RETICCTPCT" in the last 72 hours. Sepsis Labs: Recent Labs  Lab 07/29/2022 2246 08/18/22 0243 08/18/22 0648 08/18/22 1347 08/18/22 1859  PROCALCITON  --   --   --  0.15  --   WBC 10.6*  --   --   --  20.8*  LATICACIDVEN 4.2* 3.8* 2.1* 1.6  --     Microbiology Recent Results (from the past 240 hour(s))  Surgical PCR screen     Status: None   Collection Time: 08/18/22  3:44 PM   Specimen: Nasal Mucosa; Nasal Swab  Result Value Ref Range Status   MRSA, PCR NEGATIVE NEGATIVE Final   Staphylococcus aureus NEGATIVE NEGATIVE Final    Comment: (NOTE) The Xpert SA Assay (FDA approved for NASAL specimens in patients 84 years of age and older), is one component of a comprehensive surveillance program. It is not intended to diagnose infection nor to guide or monitor treatment. Performed at [facility], [place] [address] [place], [place] [address]      Medications:    sodium chloride    Intravenous Once   sodium chloride   Intravenous Once   insulin aspart  0-15 Units Subcutaneous Q6H   levothyroxine  50 mcg Oral Daily   metoprolol tartrate  25 mg Oral BID   pantoprazole (PROTONIX) IV  40 mg Intravenous BID   Continuous Infusions:  linezolid (ZYVOX) IV 300 mL/hr at 08/12/2022 0429   meropenem (MERREM) IV Stopped (08/18/2022 0334)      LOS: 1 day   Charlynne Cousins  [facility]  08/13/2022, 7:12 AM

## 2022-08-19 NOTE — Anesthesia Preprocedure Evaluation (Signed)
Anesthesia Evaluation    Reviewed: Allergy & Precautions, Patient's Chart, lab work & pertinent test results  History of Anesthesia Complications Negative for: history of anesthetic complications  Airway        Dental   Pulmonary neg pulmonary ROS          Cardiovascular hypertension, Pt. on medications + CAD, + Past MI and +CHF  + dysrhythmias Atrial Fibrillation   TTE 03/18/22: EF 60-65%, mild LVH of basal-septal segment, grade II DD, severe pHTN (68.44mHg), moderate LAE, moderate MR, mild MS, mild to moderate AR    Neuro/Psych negative neurological ROS  negative psych ROS   GI/Hepatic negative GI ROS, Neg liver ROS,,,  Endo/Other  diabetes, Type 2, Oral Hypoglycemic AgentsHypothyroidism    Renal/GU ARFRenal disease (Cr 1.36)  negative genitourinary   Musculoskeletal negative musculoskeletal ROS (+)  Right hip fracture   Abdominal   Peds  Hematology  (+) Blood dyscrasia (Hgb 10.1, INR 1.6), anemia   Anesthesia Other Findings Day of surgery medications reviewed with patient.  Reproductive/Obstetrics negative OB ROS                             Anesthesia Physical Anesthesia Plan  ASA: 3  Anesthesia Plan: General   Post-op Pain Management: Tylenol PO (pre-op)*   Induction: Intravenous  PONV Risk Score and Plan: 3 and Treatment may vary due to age or medical condition, Ondansetron and Dexamethasone  Airway Management Planned: Oral ETT  Additional Equipment: None  Intra-op Plan:   Post-operative Plan: Extubation in OR  Informed Consent:    Patient has DNR.     Plan Discussed with:   Anesthesia Plan Comments:         Anesthesia Quick Evaluation

## 2022-08-19 NOTE — Significant Event (Signed)
Rapid Response Event Note   Reason for Call :  Increased WOB, spO2 dropping. Pt placed on NRB.   Initial Focused Assessment:  On arrival, pt resting in bed with complaints of SOB and difficulty breathing. Respirations labored with accessory muscle use. Rhonchi/Rhales/expiratory wheezing noted to upper airway R>L, diminished bases.    Vitals: HR 77, BP 143/58, RR 25, spO2 97% on 15L [place].   Per primary RN, pt with SOB and desaturations, NRB placed with improvement of spO2. RN went back to check on pt and NRB not on pt face so HFNC placed and spO2 remained high 90s.      Interventions:  Md notified, CXR, IV Lasix, NRB- done prior to arrival  ABG ordered PTA O2 decreased to 12L HFNC  Md to bedside Additional '40mg'$  IV Lasix Bipap Abx adjusted- Meropenem, Linezolid added   Plan of Care:  Continue to monitor pt respiratory status.  Monitor urine output. Bladder scan if no urine output from Lasix.   RN instructed to call with any questions or concerns.    Event Summary:   MD Notified: Dr Bradd Canary Time: 0140 End Time: 0230  Sherilyn Dacosta, RN

## 2022-08-19 NOTE — Progress Notes (Addendum)
Pt complains of SOB and states that she can't breathe sats dropped down to low 80s per monitor, Pt was on 4LNC bumped her to 6Lnc sats continued to drop, switched pt to non-rebreather sats now 97-99% on non-rebreather. Respiratory paged. Provider C. Hall called , see new orders per provider wanted protonix gtt stopped. Rapid response RN notified. Call bell within reach , willl continue to monitor. Agricultural consultant notified.   08/12/2022 0001  Vitals  Temp 97.7 F (36.5 C)  Temp Source Axillary  BP (!) 144/58  BP Location Right Arm  BP Method Automatic  Patient Position (if appropriate) Lying  Pulse Rate 73  Pulse Rate Source Monitor  Resp (!) 24  Level of Consciousness  Level of Consciousness Alert  MEWS COLOR  MEWS Score Color Green  Oxygen Therapy  SpO2 97 %  O2 Device Non-rebreather Mask  MEWS Score  MEWS Temp 0  MEWS Systolic 0  MEWS Pulse 0  MEWS RR 1  MEWS LOC 0  MEWS Score 1

## 2022-08-19 NOTE — Progress Notes (Signed)
Gastroenterology Inpatient Follow Up    Subjective: Patient states that she feels SOB. Had an episode of oxygen desaturation overnight. Denies any episodes of vomiting since yesterday. She has not seen any blood in her stools.  Objective: Vital signs in last 24 hours: Temp:  [97.4 F (36.3 C)-98.4 F (36.9 C)] 98.4 F (36.9 C) (02/27 1012) Pulse Rate:  [52-88] 77 (02/27 1022) Resp:  [18-30] 19 (02/27 1022) BP: (130-144)/(53-93) 136/72 (02/27 1012) SpO2:  [90 %-100 %] 99 % (02/27 1022) FiO2 (%):  [40 %-50 %] 40 % (02/27 0919) Weight:  [52.7 kg-52.8 kg] 52.8 kg (02/27 0500) Last BM Date : 08/18/22  Intake/Output from previous day: 02/26 0701 - 02/27 0700 In: 814.4 [I.V.:198.4; Blood:282; IV Piggyback:334] Out: 100 [Urine:100] Intake/Output this shift: Total I/O In: 266.1 [IV Piggyback:266.1] Out: -   General appearance: alert and cooperative Resp: increased WOB Cardio: regular rate GI: non-tender, non-distended Extremities: no BLE edema  Lab Results: Recent Labs    08/04/2022 2246 08/18/22 1347 08/18/22 1859 08/18/22 2128 08/07/2022 0704  WBC 10.6*  --  20.8*  --  21.3*  HGB 10.8*   < > 9.6* 9.7* 10.1*  HCT 32.0*   < > 29.8* 28.7* 30.1*  PLT 214  --  207  --  207   < > = values in this interval not displayed.   BMET Recent Labs    08/18/22 1347 08/15/2022 0028 07/29/2022 0704  NA 140 139 139  K 4.3 4.6 4.3  CL 109 107 106  CO2 20* 23 21*  GLUCOSE 126* 132* 185*  BUN 24* 31* 36*  CREATININE 1.03* 1.22* 1.36*  CALCIUM 8.4* 8.4* 8.3*   LFT Recent Labs    08/02/2022 2246  PROT 6.9  ALBUMIN 2.8*  AST 39  ALT 15  ALKPHOS 69  BILITOT 0.9   PT/INR Recent Labs    08/05/2022 2246 08/04/2022 0028  LABPROT 30.8* 19.3*  INR 3.0* 1.6*   Hepatitis Panel No results for input(s): "HEPBSAG", "HCVAB", "HEPAIGM", "HEPBIGM" in the last 72 hours. C-Diff No results for input(s): "CDIFFTOX" in the last 72 hours.  Studies/Results: ECHOCARDIOGRAM LIMITED  Result  Date: 07/26/2022    ECHOCARDIOGRAM LIMITED REPORT   Patient Name:   Ana [name] Date of Exam: 08/04/2022 Medical Rec #:  [id number]    Height:       62.0 in Accession #:    [id number]   Weight:       116.4 lb Date of Birth:  [birth year]   BSA:          1.519 m Patient Age:    89 years     BP:           136/72 mmHg Patient Gender: F            HR:           74 bpm. Exam Location:  Inpatient Procedure: Limited Echo, Color Doppler, Limited Color Doppler, Strain Analysis            and 3D Echo Indications:    PRE-OP  History:        Patient has prior history of Echocardiogram examinations, most                 recent 03/18/2022. CHF, Previous Myocardial Infarction,                 Signs/Symptoms:Dyspnea; Risk Factors:Hypertension, Dyslipidemia  and Non-Smoker.  Sonographer:    Wilkie Aye RVT RCS Referring Phys: [id number] TIFFANY [place] IMPRESSIONS  1. Left ventricular ejection fraction, by estimation, is 55 to 60%. Left ventricular ejection fraction by 3D volume is 56 %. The left ventricle has normal function. The left ventricle has no regional wall motion abnormalities. Left ventricular diastolic  function could not be evaluated.  2. Right ventricular systolic function is mildly reduced. The right ventricular size is normal. Tricuspid regurgitation signal is inadequate for assessing PA pressure.  3. Moderate pleural effusion in the left lateral region.  4. The mitral valve is rheumatic. Mild to moderate mitral valve regurgitation. Moderate mitral annular calcification.  5. There is mild calcification of the aortic valve. There is moderate thickening of the aortic valve. Aortic valve regurgitation is mild to moderate. Comparison(s): No significant change from prior study. Prior images reviewed side by side. FINDINGS  Left Ventricle: Left ventricular ejection fraction, by estimation, is 55 to 60%. Left ventricular ejection fraction by 3D volume is 56 %. The left ventricle has normal function. The left  ventricle has no regional wall motion abnormalities. Left ventricular diastolic function could not be evaluated. Right Ventricle: The right ventricular size is normal. No increase in right ventricular wall thickness. Right ventricular systolic function is mildly reduced. Tricuspid regurgitation signal is inadequate for assessing PA pressure. Right Atrium: Right atrial size was normal in size. Pericardium: Trivial pericardial effusion is present. Mitral Valve: Transmitral gradients were not assessed. 2D images suggests mild mitral stenosis. The mitral valve is rheumatic. There is moderate thickening of the anterior and posterior mitral valve leaflet(s). There is mild calcification of the anterior  and posterior mitral valve leaflet(s). Mildly decreased mobility of the mitral valve leaflets. Moderate mitral annular calcification. Mild to moderate mitral valve regurgitation, with centrally-directed jet. Tricuspid Valve: Tricuspid valve regurgitation is mild. Aortic Valve: There is mild calcification of the aortic valve. There is moderate thickening of the aortic valve. Aortic valve regurgitation is mild to moderate. Pulmonic Valve: The pulmonic valve was not well visualized. Aorta: The aortic root and ascending aorta are structurally normal, with no evidence of dilitation. IAS/Shunts: No atrial level shunt detected by color flow Doppler. Additional Comments: There is a moderate pleural effusion in the left lateral region.  LEFT VENTRICLE PLAX 2D LVIDd:         4.00 cm LVIDs:         2.40 cm         2D LV PW:         1.20 cm         Longitudinal LV IVS:        1.20 cm         Strain                                2D Strain GLS  -14.1 %                                Avg: LV Volumes (MOD) LV vol d, MOD    90.4 ml       3D Volume EF A2C:                           LV 3D EF:    Left LV vol d, MOD    68.8 ml  ventricul A4C:                                        ar LV vol s, MOD    32.0 ml                     ejection A2C:                                        fraction LV vol s, MOD    34.8 ml                    by 3D A4C:                                        volume is LV SV MOD A2C:   58.4 ml                    56 %. LV SV MOD A4C:   68.8 ml LV SV MOD BP:    52.4 ml                                3D Volume EF:                                3D EF:        56 % RIGHT VENTRICLE RV S prime:     7.56 cm/s TAPSE (M-mode): 1.6 cm LEFT ATRIUM             Index        RIGHT ATRIUM           Index LA diam:        3.40 cm 2.24 cm/m   RA Area:     12.50 cm LA Vol (A2C):   46.5 ml 30.62 ml/m  RA Volume:   29.30 ml  19.29 ml/m LA Vol (A4C):   35.1 ml 23.11 ml/m LA Biplane Vol: 43.2 ml 28.44 ml/m Sanda Klein MD Electronically signed by Sanda Klein MD Signature Date/Time: 08/08/2022/2:11:24 PM    Final    DG CHEST PORT 1 VIEW  Result Date: 08/16/2022 CLINICAL DATA:  Hypoxia. EXAM: PORTABLE CHEST 1 VIEW COMPARISON:  08/03/2022. FINDINGS: The heart is enlarged and the mediastinal contour stable. There is atherosclerotic calcification of the aorta. Consolidation is noted in the right upper lobe. Perihilar airspace disease is present bilaterally. There is a small left pleural effusion. No pneumothorax. Degenerative changes are noted in the thoracic spine. No acute osseous abnormality. IMPRESSION: 1. Right upper lobe consolidation with perihilar airspace disease bilaterally, concerning for multifocal pneumonia and/or superimposed edema. 2. Small left pleural effusion. 3. Cardiomegaly. Electronically Signed   By: Brett Fairy M.D.   On: 08/02/2022 00:17   DG Knee Complete 4 Views Left  Result Date: 08/18/2022 CLINICAL DATA:  Recent fall with left knee discomfort and bruising EXAM: LEFT KNEE - COMPLETE 4+ VIEW COMPARISON:  10/11/2013 FINDINGS: No visible knee joint effusion. Medial compartment joint space narrowing and osteophyte formation consistent with osteoarthritis. Bony irregularity of  the posterior margin of the  patella consistent with chondromalacia patella. No acute traumatic finding. Extensive regional arterial calcification is present. IMPRESSION: No acute or traumatic finding. Medial compartment osteoarthritis. Chondromalacia patella. Extensive regional arterial calcification. Electronically Signed   By: Nelson Chimes M.D.   On: 08/18/2022 10:18   CT ANGIO GI BLEED  Result Date: 08/18/2022 CLINICAL DATA:  Coffee ground emesis. Fell yesterday with acute proximal right femoral fracture. EXAM: CTA ABDOMEN AND PELVIS WITHOUT AND WITH CONTRAST TECHNIQUE: Multidetector CT imaging of the abdomen and pelvis was performed using the standard protocol during bolus administration of intravenous contrast. Multiplanar reconstructed images and MIPs were obtained and reviewed to evaluate the vascular anatomy. RADIATION DOSE REDUCTION: This exam was performed according to the departmental dose-optimization program which includes automated exposure control, adjustment of the mA and/or kV according to patient size and/or use of iterative reconstruction technique. CONTRAST:  54m OMNIPAQUE IOHEXOL 350 MG/ML SOLN COMPARISON:  AP pelvis and femoral x-rays yesterday. CT abdomen pelvis with contrast 09/29/17, CTA chest, abdomen and pelvis 01/14/2014. FINDINGS: VASCULAR Aorta: There are moderate calcific plaques progressive from 2015 without aneurysm, dissection or critical stenosis. Celiac: Progress ostial calcific plaques with 75% vessel origin stenosis, otherwise opacifies well but with branch vessel atherosclerosis also seen. SMA: There are calcific plaques proximally with up to 40% stenosis in the proximal 1 cm. There is no other flow-limiting stenosis. No branch occlusion. Renals: Both renal arteries are patent without evidence of aneurysm, dissection, vasculitis, fibromuscular dysplasia or significant stenosis. IMA: Severe calcific origin stenosis. The vessel otherwise opacifies well. No branch occlusion. Inflow: Moderate patchy  calcific plaques. No flow-limiting inflow stenosis. Proximal Outflow: There are calcific plaques heaviest in the superficial femoral arteries with at least 60% stenosis in the visualized portions. Veins: Patent. Review of the MIP images confirms the above findings. NON-VASCULAR Lower chest: Mild cardiomegaly. There is calcification in the coronary arteries and mitral ring. Minimal pericardial effusion. There are moderate layering pleural effusions. There is patchy ground-glass disease in the lung bases which could be due to edema or pneumonia . Hepatobiliary: Respiratory motion limits fine detail. There is no obvious liver mass. Gallbladder and bile ducts are unremarkable, as visualized. Pancreas: Atrophic and otherwise unremarkable. Spleen: Unremarkable. Adrenals/Urinary Tract: There is no adrenal mass. No renal mass is seen through the motion artifact. There is urothelial thickening and stranding involving the left renal pelvis and proximal ureter concerning for ascending UTI, with mild thickening of the bladder and perivesical stranding compatible with cystitis. No urinary stone or obstruction is seen. Stomach/Bowel: The stomach distended with air and fluid and has a normal wall thickness. The small bowel is decompressed. There is wall thickening or underdistention in the distal ascending, transverse and descending colon. Large stool impaction in the rectum and possible stercoral proctitis. No contrast extravasation into the stomach or bowel is seen. An appendix is not seen in this patient. Lymphatic: There is body wall anasarca and generalized mesenteric congestive changes. Reproductive: Uterus and bilateral adnexa are unremarkable. Other: Small amount of scattered abdominal and pelvic free ascites. No drainable pocket. No free air. There is no incarcerated hernia. Musculoskeletal: Osteopenia, levoscoliosis and degenerative change lumbar spine. Transverse midcervical proximal right femoral fracture is again  noted with impaction and mild cephalad displacement. IMPRESSION: 1. Aortic and branch vessel atherosclerosis. 2. 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 3. No contrast extravasation into the bowel is seen. 4. Moderate pleural effusions with patchy ground-glass disease  in the lung bases which could be due to edema or pneumonia. 5. Cardiomegaly with calcific CAD. 6. Body wall anasarca, mesenteric congestion and small amount of scattered abdominal and pelvic free ascites. 7. Probable cystitis and likely left-sided ascending UTI. 8. Large stool impaction in the rectum with possible stercoral proctitis. 9. Colitis versus nondistention of the distal ascending, transverse and descending colon. 10. Distended stomach with air and fluid. No small bowel dilatation. 11. Transverse midcervical proximal right femoral fracture with impaction and mild cephalad displacement. 12. Osteopenia and degenerative change. Aortic Atherosclerosis (ICD10-I70.0). Electronically Signed   By: Telford Nab M.D.   On: 08/18/2022 05:02   DG Femur Portable 1 View Left  Result Date: 08/18/2022 CLINICAL DATA:  Trauma, fall. EXAM: LEFT FEMUR PORTABLE 1 VIEW COMPARISON:  Fall pelvis radiograph earlier today. FINDINGS: Divided frontal views of the left femur obtained. Cortical margins of the left femur are intact. No left femur fracture on this single view. Knee alignment is maintained. The included pubic rami are intact. Prominent vascular calcifications. IMPRESSION: 1. No left femur fracture on divided frontal view. 2. Prominent vascular calcifications. Electronically Signed   By: Keith Rake M.D.   On: 07/26/2022 23:52   CT HEAD WO CONTRAST  Result Date: 08/03/2022 CLINICAL DATA:  Head trauma EXAM: CT HEAD WITHOUT CONTRAST CT CERVICAL SPINE WITHOUT CONTRAST TECHNIQUE: Multidetector CT imaging of the head and cervical spine was performed following the standard protocol  without intravenous contrast. Multiplanar CT image reconstructions of the cervical spine were also generated. RADIATION DOSE REDUCTION: This exam was performed according to the departmental dose-optimization program which includes automated exposure control, adjustment of the mA and/or kV according to patient size and/or use of iterative reconstruction technique. COMPARISON:  None Available. FINDINGS: CT HEAD FINDINGS Brain: There is no mass, hemorrhage or extra-axial collection. The size and configuration of the ventricles and extra-axial CSF spaces are normal. There is hypoattenuation of the periventricular white matter, most commonly indicating chronic ischemic microangiopathy. Vascular: Atherosclerotic calcification of the vertebral and internal carotid arteries at the skull base. No abnormal hyperdensity of the major intracranial arteries or dural venous sinuses. Skull: The visualized skull base, calvarium and extracranial soft tissues are normal. Sinuses/Orbits: No fluid levels or advanced mucosal thickening of the visualized paranasal sinuses. No mastoid or middle ear effusion. The orbits are normal. CT CERVICAL SPINE FINDINGS Alignment: No static subluxation. Facets are aligned. Occipital condyles are normally positioned. Skull base and vertebrae: No acute fracture. Soft tissues and spinal canal: No prevertebral fluid or swelling. No visible canal hematoma. Disc levels: No advanced spinal canal or neural foraminal stenosis. Upper chest: Biapical emphysema Other: Normal visualized paraspinal cervical soft tissues. IMPRESSION: 1. No acute intracranial abnormality. 2. Chronic ischemic microangiopathy. 3. No acute fracture or static subluxation of the cervical spine. Electronically Signed   By: Ulyses Jarred M.D.   On: 08/02/2022 23:30   CT CERVICAL SPINE WO CONTRAST  Result Date: 08/15/2022 CLINICAL DATA:  Head trauma EXAM: CT HEAD WITHOUT CONTRAST CT CERVICAL SPINE WITHOUT CONTRAST TECHNIQUE: Multidetector  CT imaging of the head and cervical spine was performed following the standard protocol without intravenous contrast. Multiplanar CT image reconstructions of the cervical spine were also generated. RADIATION DOSE REDUCTION: This exam was performed according to the departmental dose-optimization program which includes automated exposure control, adjustment of the mA and/or kV according to patient size and/or use of iterative reconstruction technique. COMPARISON:  None Available. FINDINGS: CT HEAD FINDINGS Brain: There is no mass, hemorrhage or  extra-axial collection. The size and configuration of the ventricles and extra-axial CSF spaces are normal. There is hypoattenuation of the periventricular white matter, most commonly indicating chronic ischemic microangiopathy. Vascular: Atherosclerotic calcification of the vertebral and internal carotid arteries at the skull base. No abnormal hyperdensity of the major intracranial arteries or dural venous sinuses. Skull: The visualized skull base, calvarium and extracranial soft tissues are normal. Sinuses/Orbits: No fluid levels or advanced mucosal thickening of the visualized paranasal sinuses. No mastoid or middle ear effusion. The orbits are normal. CT CERVICAL SPINE FINDINGS Alignment: No static subluxation. Facets are aligned. Occipital condyles are normally positioned. Skull base and vertebrae: No acute fracture. Soft tissues and spinal canal: No prevertebral fluid or swelling. No visible canal hematoma. Disc levels: No advanced spinal canal or neural foraminal stenosis. Upper chest: Biapical emphysema Other: Normal visualized paraspinal cervical soft tissues. IMPRESSION: 1. No acute intracranial abnormality. 2. Chronic ischemic microangiopathy. 3. No acute fracture or static subluxation of the cervical spine. Electronically Signed   By: Ulyses Jarred M.D.   On: 08/12/2022 23:30   DG Pelvis Portable  Result Date: 08/13/2022 CLINICAL DATA:  Status post fall. EXAM:  PORTABLE PELVIS 1-2 VIEWS COMPARISON:  None Available. FINDINGS: Acute fracture deformity is seen extending through the neck of the proximal right femur. There is no evidence of dislocation. A chronic appearing deformity is suspected along the lateral aspect of the left iliac crest. Degenerative changes are seen involving both hips, as well as the visualized portion of the lower lumbar spine. Soft tissue structures are unremarkable. IMPRESSION: Acute fracture of the proximal right femur. Electronically Signed   By: Virgina Norfolk M.D.   On: 08/14/2022 23:13   DG FEMUR PORT, 1V RIGHT  Result Date: 07/29/2022 CLINICAL DATA:  Status post fall. EXAM: RIGHT FEMUR PORTABLE 1 VIEW COMPARISON:  None Available. FINDINGS: An acute fracture deformity is seen extending through the neck of the proximal right femur. Approximately 1/2 shaft width dorsal displacement of the distal fracture site is noted. There is no evidence of dislocation. Moderate severity vascular calcification is noted. IMPRESSION: Acute fracture of the proximal right femur. Electronically Signed   By: Virgina Norfolk M.D.   On: 08/06/2022 23:11   DG Chest Port 1 View  Result Date: 07/27/2022 CLINICAL DATA:  Status post fall. EXAM: PORTABLE CHEST 1 VIEW COMPARISON:  February 19, 2022 FINDINGS: The cardiac silhouette is mildly enlarged and unchanged in size. There is marked severity calcification of the aortic arch. Diffuse, chronic appearing increased interstitial lung markings are seen. Mild atelectatic changes are present within the bilateral lung bases. There is no evidence of a pleural effusion or pneumothorax. Multilevel degenerative changes seen throughout the thoracic spine. IMPRESSION: Chronic appearing increased interstitial lung markings with mild bibasilar atelectasis. Electronically Signed   By: Virgina Norfolk M.D.   On: 07/31/2022 23:09   DG Knee Right Port  Result Date: 08/11/2022 CLINICAL DATA:  Status post fall. EXAM: PORTABLE  RIGHT KNEE - 1-2 VIEW COMPARISON:  None Available. FINDINGS: No evidence of acute fracture, dislocation, or joint effusion. There is marked severity patellofemoral and medial tibiofemoral compartment space narrowing. Moderate severity vascular calcification is seen. IMPRESSION: Marked severity degenerative changes without an acute osseous abnormality. Electronically Signed   By: Virgina Norfolk M.D.   On: 08/16/2022 23:07    Medications: I have reviewed the patient's current medications. Scheduled:  amLODipine  5 mg Oral Daily   insulin aspart  0-15 Units Subcutaneous Q6H   levothyroxine  50  mcg Oral Daily   melatonin  3 mg Oral QHS   metoprolol tartrate  25 mg Oral BID   pantoprazole (PROTONIX) IV  40 mg Intravenous BID   Continuous:  linezolid (ZYVOX) IV Stopped (08/09/2022 0522)   meropenem (MERREM) IV Stopped (08/11/2022 0334)   [id number] **OR** acetaminophen, guaiFENesin, haloperidol lactate, hydrALAZINE, HYDROmorphone (DILAUDID) injection, ipratropium-albuterol, metoprolol tartrate, prochlorperazine, senna-docusate  Assessment/Plan: 89 year old female with history of HFpEF, DM, A-fib on warfarin, pulm HTN p/w a fall, found to have a right femur fracture. We are consulted for coffee ground emesis. Patient's hemoglobin has remained stable during this hospitalization and compared to labs prior to this hospitalization.  Patient has not had any episodes of coffee-ground emesis since yesterday.  At this time I do not think that the patient has an active GI bleed based upon her stable hemoglobin and lack of continued gross bleeding.  She was found to have a supratherapeutic INR of 4.2 upon arrival, which could have led to oozing from her GI tract.  Her INR was 1.6 today.  Without active signs of bleeding, the only benefit to performing an EGD would be to rule out an upper GI malignancy.  Due to the patient patient's advanced age and comorbidities, it would be unlikely that she would be a  candidate for any cancer therapies if a cancer were to be found.  I discussed the risk and benefits of performing the EGD procedure with the patient's son Jonia Turowski, and he is in agreement that holding off on an EGD at this time is the best course of action.  If the patient were to develop recurrent signs of bleeding in the future, we could reevaluate the need for EGD.  Patient is already being managed for an active hip fracture and her acute respiratory failure. Palliative care is on board for ongoing goals of care discussions.  - Trend Hb - Cont PPI BID for 8 weeks, then QD - Palliative care following - GI will sign off for now. Please call back if any recurrent bleeding occurs   LOS: 1 day   Sharyn Creamer 08/18/2022, 2:44 PM

## 2022-08-19 NOTE — Progress Notes (Signed)
Provider notified that pt's entire bed was soaked with urine post IV lasix , pt also had a medium sized black  stool. Son at bedside, call bell within reach.

## 2022-08-19 NOTE — Progress Notes (Signed)
Pharmacy Antibiotic Note  LAURISA FARM is a 89 y.o. female admitted on 08/18/2022 with sepsis.  Pharmacy has been consulted for Merrem dosing. Already on Zyvox.WBC increased this AM.   Plan: Merrem 1g IV q12h Zyvox per MD Trend WBC, temp, renal function  F/U infectious work-up   Height: '5\' 2"'$  (157.5 cm) Weight: 52.7 kg (116 lb 2.9 oz) IBW/kg (Calculated) : 50.1  Temp (24hrs), Avg:97.9 F (36.6 C), Min:97.4 F (36.3 C), Max:98.2 F (36.8 C)  Recent Labs  Lab 08/02/2022 2245 07/29/2022 2246 08/18/22 0243 08/18/22 0648 08/18/22 1347 08/18/22 1859 08/14/2022 0028  WBC  --  10.6*  --   --   --  20.8*  --   CREATININE 0.90 1.16*  --   --  1.03*  --  1.22*  LATICACIDVEN  --  4.2* 3.8* 2.1* 1.6  --   --     Estimated Creatinine Clearance: 25.2 mL/min (A) (by C-G formula based on SCr of 1.22 mg/dL (H)).    Allergies  Allergen Reactions   Welchol [Colesevelam Hcl] Nausea Only   Zetia [Ezetimibe] Other (See Comments)    Edema    Crestor [Rosuvastatin] Other (See Comments)    Unknown   Statins Other (See Comments)    Unknown   Zocor [Simvastatin] Other (See Comments)    Muscle weakness   Narda Bonds, PharmD, BCPS Clinical Pharmacist Phone: [phone]

## 2022-08-20 ENCOUNTER — Inpatient Hospital Stay (HOSPITAL_COMMUNITY): Payer: Medicare HMO

## 2022-08-20 ENCOUNTER — Encounter (HOSPITAL_COMMUNITY): Payer: Self-pay | Admitting: Orthopedic Surgery

## 2022-08-20 DIAGNOSIS — J9601 Acute respiratory failure with hypoxia: Secondary | ICD-10-CM | POA: Diagnosis not present

## 2022-08-20 DIAGNOSIS — I2510 Atherosclerotic heart disease of native coronary artery without angina pectoris: Secondary | ICD-10-CM | POA: Diagnosis not present

## 2022-08-20 DIAGNOSIS — I480 Paroxysmal atrial fibrillation: Secondary | ICD-10-CM | POA: Diagnosis not present

## 2022-08-20 DIAGNOSIS — I5032 Chronic diastolic (congestive) heart failure: Secondary | ICD-10-CM | POA: Diagnosis not present

## 2022-08-20 DIAGNOSIS — Z66 Do not resuscitate: Secondary | ICD-10-CM

## 2022-08-20 DIAGNOSIS — S72051A Unspecified fracture of head of right femur, initial encounter for closed fracture: Secondary | ICD-10-CM | POA: Diagnosis not present

## 2022-08-20 DIAGNOSIS — I4819 Other persistent atrial fibrillation: Secondary | ICD-10-CM | POA: Diagnosis not present

## 2022-08-20 DIAGNOSIS — J690 Pneumonitis due to inhalation of food and vomit: Secondary | ICD-10-CM | POA: Diagnosis not present

## 2022-08-20 DIAGNOSIS — Z515 Encounter for palliative care: Secondary | ICD-10-CM

## 2022-08-20 DIAGNOSIS — K920 Hematemesis: Secondary | ICD-10-CM | POA: Diagnosis not present

## 2022-08-20 LAB — CBC
HCT: 30.3 % — ABNORMAL LOW (ref 36.0–46.0)
HCT: 28.3 % — ABNORMAL LOW (ref 36.0–46.0)
Hemoglobin: 9.6 g/dL — ABNORMAL LOW (ref 12.0–15.0)
Hemoglobin: 9.5 g/dL — ABNORMAL LOW (ref 12.0–15.0)
MCH: 29.8 pg (ref 26.0–34.0)
MCH: 30.2 pg (ref 26.0–34.0)
MCHC: 31.7 g/dL (ref 30.0–36.0)
MCHC: 33.6 g/dL (ref 30.0–36.0)
MCV: 94.1 fL (ref 80.0–100.0)
MCV: 89.8 fL (ref 80.0–100.0)
Platelets: 205 10*3/uL (ref 150–400)
Platelets: 214 10*3/uL (ref 150–400)
RBC: 3.22 MIL/uL — ABNORMAL LOW (ref 3.87–5.11)
RBC: 3.15 MIL/uL — ABNORMAL LOW (ref 3.87–5.11)
RDW: 13.9 % (ref 11.5–15.5)
RDW: 13.9 % (ref 11.5–15.5)
WBC: 22.3 10*3/uL — ABNORMAL HIGH (ref 4.0–10.5)
WBC: 13.2 10*3/uL — ABNORMAL HIGH (ref 4.0–10.5)
nRBC: 0 % (ref 0.0–0.2)
nRBC: 0 % (ref 0.0–0.2)

## 2022-08-20 LAB — GLUCOSE, CAPILLARY
Glucose-Capillary: 120 mg/dL — ABNORMAL HIGH (ref 70–99)
Glucose-Capillary: 124 mg/dL — ABNORMAL HIGH (ref 70–99)
Glucose-Capillary: 142 mg/dL — ABNORMAL HIGH (ref 70–99)
Glucose-Capillary: 151 mg/dL — ABNORMAL HIGH (ref 70–99)

## 2022-08-20 LAB — BASIC METABOLIC PANEL
Anion gap: 13 (ref 5–15)
BUN: 50 mg/dL — ABNORMAL HIGH (ref 8–23)
CO2: 20 mmol/L — ABNORMAL LOW (ref 22–32)
Calcium: 8 mg/dL — ABNORMAL LOW (ref 8.9–10.3)
Chloride: 107 mmol/L (ref 98–111)
Creatinine, Ser: 1.76 mg/dL — ABNORMAL HIGH (ref 0.44–1.00)
GFR, Estimated: 27 mL/min — ABNORMAL LOW (ref >60)
Glucose, Bld: 160 mg/dL — ABNORMAL HIGH (ref 70–99)
Potassium: 4.5 mmol/L (ref 3.5–5.1)
Sodium: 140 mmol/L (ref 135–145)

## 2022-08-20 LAB — PROTIME-INR
INR: 1.4 — ABNORMAL HIGH (ref 0.8–1.2)
Prothrombin Time: 17.4 seconds — ABNORMAL HIGH (ref 11.4–15.2)

## 2022-08-20 MED ORDER — PROPOFOL 1000 MG/100ML IV EMUL
INTRAVENOUS | Status: AC
  Filled 2022-08-20: qty 400

## 2022-08-20 MED ORDER — METOPROLOL SUCCINATE ER 25 MG PO TB24
12.5000 mg | ORAL_TABLET | Freq: Every day | ORAL | Status: DC
  Administered 2022-08-21 – 2022-08-23 (×3): 12.5 mg via ORAL
  Filled 2022-08-20 (×3): qty 1

## 2022-08-20 MED ORDER — SODIUM CHLORIDE 0.9 % IV SOLN
3.0000 g | Freq: Two times a day (BID) | INTRAVENOUS | Status: DC
  Administered 2022-08-20 – 2022-08-22 (×4): 3 g via INTRAVENOUS
  Filled 2022-08-20 (×4): qty 8

## 2022-08-20 MED ORDER — SODIUM CHLORIDE 0.9 % IV SOLN
500.0000 mg | Freq: Two times a day (BID) | INTRAVENOUS | Status: DC
  Filled 2022-08-20 (×2): qty 10

## 2022-08-20 NOTE — Progress Notes (Signed)
Palliative Care Progress Note, Assessment & Plan   Patient Name: Ana [name]       Date: 08/20/2022 DOB: 1931/11/26  Age: 89 y.o. MRN#: [id number] Attending Physician: Charlynne Cousins, MD Primary Care Physician: London Pepper, MD Admit Date: 08/16/2022  Subjective: Patient is lying in bed in no apparent distress.  She is sleeping, easily arousable, acknowledges my presence, and is able to make her wishes known.  She denies pain at this time.  Her son is at bedside.  HPI: 89 y.o. female  with past medical history of HLD, HTN, hypothyroidism, not permanent A-fib, type 2 diabetes, chronic diastolic CHF, MI (123456), vitamin D deficiency, urinary incontinence and pulmonary HTN admitted on 08/15/2022 after being found down s/p unwitnessed fall at home the night before.    In ED, patient experienced ground coffee emesis with hgb of 10.8 on admission.    Radiographic imaging reveals acute fracture of right proximal femur.     Ortho, GI, and cardiology were consulted.   Orthopedics was awaiting cardiac clearance from cardiology prior to surgical intervention.  Cardiology granted clearance once hematesis stopped. However, night of 2/26 patient became hypoxic and was placed on BiPAP.  IV Lasix were initiated.  Due to hypoxic event overnight, surgical intervention has been delayed.   In light of new GI bleed, PNA, and hip fracture, PMT was consulted due to patient's poor prognosis.  Summary of counseling/coordination of care: After reviewing the patient's chart and assessing the patient at bedside, I spoke with patient and her son in regards to plan and goals of care.  As far as symptom burden, son states that patient's confusion is improving and that she is recognizing when she is confused and when she is not.   He also shares that she has not required as much oxygen.  We discussed that patient is still requiring 10 L high flow nasal cannula but not increasing amounts or use of bipap.   I attempted to elicit goals and values important to patient and her son.  Patient shares that she wants to do what she can to get better.  She remains in agreement to have hip surgery if/when able.  Son shares that he is in agreement for patient to have surgery when she is stable.  He is hopeful her respiratory status will continue to improve.  He shares he had a lot of questions answered by cardiology's visit this morning and is just waiting to see if she improves.  Watchful waiting continues. Ongoing support to be provided. PMT will continue to follow.   Physical Exam Constitutional:      General: She is not in acute distress.    Appearance: She is not ill-appearing.  HENT:     Head: Normocephalic.     Mouth/Throat:     Mouth: Mucous membranes are moist.  Eyes:     Pupils: Pupils are equal, round, and reactive to light.  Cardiovascular:     Rate and Rhythm: Normal rate.     Pulses: Normal pulses.  Pulmonary:     Effort: Pulmonary effort is normal.     Comments: 10 HFNC Abdominal:     Palpations: Abdomen is soft.  Musculoskeletal:  Comments: Generalized weakness  Skin:    General: Skin is warm and dry.     Coloration: Skin is pale.  Neurological:     Mental Status: She is alert and oriented to person, place, and time.  Psychiatric:        Mood and Affect: Mood normal.        Behavior: Behavior normal.        Thought Content: Thought content normal.        Judgment: Judgment normal.             Total Time 35 minutes   Juliona Vales L. Ilsa Iha, FNP-BC Palliative Medicine Team Team Phone # [phone]

## 2022-08-20 NOTE — Evaluation (Signed)
Clinical/Bedside Swallow Evaluation Patient Details  Name: Ana [name] MRN: [id number] Date of Birth: February 26, 1932  Today's Date: 08/20/2022 Time: SLP Start Time (ACUTE ONLY): 1208 SLP Stop Time (ACUTE ONLY): 1223 SLP Time Calculation (min) (ACUTE ONLY): 15 min  Past Medical History:  Past Medical History:  Diagnosis Date   Anemia of chronic disease    Aortic insufficiency    moderate by echo 03/2018   Chronic diastolic CHF (congestive heart failure) ([place]) 2014   Diabetes mellitus without complication ([place])    Dizziness 02/13/2015   Hyperlipidemia    Hypertension    Hypothyroidism    MI, old 07/18/2013   Mitral stenosis    mild by echo 03/2018   Permanent atrial fibrillation ([place])    Pulmonary HTN ([place])    PASP 42mHg 03/2018   Urinary incontinence    Vitamin D deficiency disease    Past Surgical History:  Past Surgical History:  Procedure Laterality Date   CATARACT EXTRACTION     LAPAROSCOPIC APPENDECTOMY N/A 04/14/2017   Procedure: APPENDECTOMY LAPAROSCOPIC;  Surgeon: IFanny Skates MD;  Location: [facility];  Service: General;  Laterality: N/A;   LEFT HEART CATHETERIZATION WITH CORONARY ANGIOGRAM N/A 01/17/2014   Procedure: LEFT HEART CATHETERIZATION WITH CORONARY ANGIOGRAM;  Surgeon: TTroy Sine MD;  [facility];  Service: Cardiovascular;  Laterality: N/A;   PERCUTANEOUS CORONARY STENT INTERVENTION (PCI-S)  01/17/2014   Procedure: PERCUTANEOUS CORONARY STENT INTERVENTION (PCI-S);  Surgeon: TTroy Sine MD;  [facility];  Service: Cardiovascular;;   PERCUTANEOUS CORONARY STENT INTERVENTION (PCI-S) N/A 01/19/2014   Procedure: PERCUTANEOUS CORONARY STENT INTERVENTION (PCI-S);  Surgeon: DLeonie Man MD;  [facility];  Service: Cardiovascular;  Laterality: N/A;   TOTAL HIP ARTHROPLASTY Right 08/14/2022   Procedure: RIGHT TOTAL HIP ARTHROPLASTY VS HEMIARTHROPLASTY (ANTERIOR APPROACH);  Surgeon: DMeredith Pel MD;  [facility]   Service: Orthopedics;  Laterality: Right;   HPI:  89year old female who lives alone and was independent until she was found down for approximately 24 hours noted to have vomited blood aspirated and had a fractured right hip.  She was admitted to the hospital was requiring high flow nasal cannula and noninvasive mechanical ventilatory support to maintain adequate oxygenation. CXR 2/28: "Persistent heterogeneous opacities in the right-greater-than-left lung, with a new hazy opacity in the peripheral aspect of the right lower lung. This may represent atelectasis or pneumonia."    Assessment / Plan / Recommendation  Clinical Impression  Pt presents with clinical indicators of pharyngeal dysphagia.  With thin liquid there was consistent cough after swallow.  Pt required 2-3 swallows per bolus.  Initially pt appeared to have difficulty retrieving bolus both by cup and straw.  This resolved across trials, but cough after swallow persisted.  Nectar thick liquid eliminated coughing even with straw sips. With regular solid there was prolonged oral phase and moderate-severe oral residue, with significant bolus retention in L buccal cavity.  Pt was also distracted during this trial by chapped lips.  Soft solids made oral phase more efficient. There was mild residue on soft palate which was cleared with nectar thick liquid wash.  Pt would benefit from further assessment of pharyngeal swallow function. Will plan for MBSS next date.    Recommend mechanical soft solids and nectar thick liquids pending results of instrumental swallow study.  SLP Visit Diagnosis: Dysphagia, unspecified (R13.10)    Aspiration Risk  Moderate aspiration risk    Diet Recommendation Dysphagia 3 (Mech soft);Nectar-thick liquid  Liquid Administration via: Cup;Straw  Medication Administration: Whole meds with puree  Supervision: Staff to assist with self feeding  Compensations:  Slow rate; Small sips/bites; Follow solids with  liquid, as needed to reduce oral residue  Postural Changes: Seated upright at 90 degrees    Other  Recommendations Oral Care Recommendations: Oral care BID    Recommendations for follow up therapy are one component of a multi-disciplinary discharge planning process, led by the attending physician.  Recommendations may be updated based on patient status, additional functional criteria and insurance authorization.  Follow up Recommendations  (TBD)      Assistance Recommended at Discharge    Functional Status Assessment Patient has had a recent decline in their functional status and demonstrates the ability to make significant improvements in function in a reasonable and predictable amount of time.  Frequency and Duration  (TBD)          Prognosis Prognosis for improved oropharyngeal function:  (TBD)      Swallow Study   General Date of Onset: 08/09/2022 HPI: 89 year old female who lives alone and was independent until she was found down for approximately 24 hours noted to have vomited blood aspirated and had a fractured right hip.  She was admitted to the hospital was requiring high flow nasal cannula and noninvasive mechanical ventilatory support to maintain adequate oxygenation. CXR 2/28: "Persistent heterogeneous opacities in the right-greater-than-left lung, with a new hazy opacity in the peripheral aspect of the right lower lung. This may represent atelectasis or pneumonia." Type of Study: Bedside Swallow Evaluation Previous Swallow Assessment: None Diet Prior to this Study: NPO Temperature Spikes Noted: No Respiratory Status: Nasal cannula History of Recent Intubation: No Behavior/Cognition: Alert;Cooperative;Pleasant mood Oral Cavity Assessment: Within Functional Limits Oral Care Completed by SLP: No Oral Cavity - Dentition: Adequate natural dentition;Dentures, top Self-Feeding Abilities: Needs assist Patient Positioning: Upright in bed Baseline Vocal Quality:  Normal Volitional Cough: Strong Volitional Swallow: Able to elicit    Oral/Motor/Sensory Function Overall Oral Motor/Sensory Function: Mild impairment Facial ROM: Within Functional Limits Facial Symmetry: Within Functional Limits Lingual ROM: Within Functional Limits Lingual Symmetry: Within Functional Limits Lingual Strength: Reduced Velum: Within Functional Limits Mandible: Within Functional Limits   Ice Chips Ice chips: Not tested   Thin Liquid Thin Liquid: Impaired Presentation: Cup;Straw Pharyngeal  Phase Impairments: Cough - Immediate;Throat Clearing - Immediate;Multiple swallows    Nectar Thick Nectar Thick Liquid: Within functional limits Presentation: Straw   Honey Thick Honey Thick Liquid: Not tested   Puree Puree: Within functional limits Presentation: Spoon   Solid     Solid: Impaired Oral Phase Functional Implications: Prolonged oral transit;Oral residue      Celedonio Savage, MA, [place] Office: [phone] 08/20/2022,12:41 PM

## 2022-08-20 NOTE — Progress Notes (Signed)
Rounding Note    Patient Name: Ana [name] Date of Encounter: 08/20/2022  [place] Cardiologist: None   Subjective   Feeling OK.  Breathing improving.    Inpatient Medications    Scheduled Meds:  amLODipine  5 mg Oral Daily   insulin aspart  0-15 Units Subcutaneous Q6H   levothyroxine  50 mcg Oral Daily   melatonin  3 mg Oral QHS   metoprolol tartrate  25 mg Oral BID   pantoprazole (PROTONIX) IV  40 mg Intravenous BID   Continuous Infusions:  sodium chloride 10 mL/hr at 07/30/2022 1648   linezolid (ZYVOX) IV 600 mg (08/20/22 0227)   meropenem (MERREM) IV     PRN Meds: sodium chloride, acetaminophen **OR** acetaminophen, guaiFENesin, haloperidol lactate, hydrALAZINE, HYDROmorphone (DILAUDID) injection, ipratropium-albuterol, metoprolol tartrate, prochlorperazine, senna-docusate   Vital Signs    Vitals:   08/20/22 0037 08/20/22 0443 08/20/22 0500 08/20/22 0736  BP: (!) 119/50 (!) 121/49  (!) 111/56  Pulse: 65 (!) 59  (!) 53  Resp: (!) '22 19  20  '$ Temp: (!) 97 F (36.1 C) 97.6 F (36.4 C)  (!) 97.4 F (36.3 C)  TempSrc: Oral Oral  Axillary  SpO2: 98% 98%  96%  Weight: 51.5 kg  51.5 kg   Height:        Intake/Output Summary (Last 24 hours) at 08/20/2022 1047 Last data filed at 08/20/2022 0800 Gross per 24 hour  Intake 266.1 ml  Output 101 ml  Net 165.1 ml      08/20/2022    5:00 AM 08/20/2022   12:37 AM 08/09/2022    5:00 AM  Last 3 Weights  Weight (lbs) 113 lb 8.6 oz 113 lb 8.6 oz 116 lb 6.5 oz  Weight (kg) 51.5 kg 51.5 kg 52.8 kg      Telemetry    Sinus rhythm.  PVCs. Blocked PACs. PVC - Personally Reviewed  ECG    N/a - Personally Reviewed  Physical Exam   VS:  BP (!) 111/56 (BP Location: Left Arm)   Pulse (!) 53   Temp (!) 97.4 F (36.3 C) (Axillary)   Resp 20   Ht '5\' 2"'$  (1.575 m)   Wt 51.5 kg   SpO2 96%   BMI 20.77 kg/m  , BMI Body mass index is 20.77 kg/m. GENERAL:  Frail.  No acute distress.  HEENT: Pupils equal round  and reactive, fundi not visualized, oral mucosa unremarkable NECK:  No jugular venous distention, waveform within normal limits, carotid upstroke brisk and symmetric, no bruits, no thyromegaly LUNGS:  Bilateral rhonchi improving HEART:  RRR.  PMI not displaced or sustained,S1 and S2 within normal limits, no S3, no S4, no clicks, no rubs, no murmurs ABD:  Flat, positive bowel sounds normal in frequency in pitch, no bruits, no rebound, no guarding, no midline pulsatile mass, no hepatomegaly, no splenomegaly EXT:  2 plus pulses throughout, no edema, no cyanosis no clubbing SKIN:  No rashes no nodules NEURO:  Cranial nerves II through XII grossly intact, motor grossly intact throughout PSYCH:  Cognitively intact, oriented to person place and time   Labs    High Sensitivity Troponin:   Recent Labs  Lab 07/24/2022 2246 08/18/22 0243  TROPONINIHS 16 25*     Chemistry Recent Labs  Lab 08/03/2022 2246 08/18/22 1347 08/11/2022 0028 08/10/2022 0704 08/20/22 0053  NA 137   < > 139 139 140  K 4.2   < > 4.6 4.3 4.5  CL 107   < >  107 106 107  CO2 19*   < > 23 21* 20*  GLUCOSE 245*   < > 132* 185* 160*  BUN 23   < > 31* 36* 50*  CREATININE 1.16*   < > 1.22* 1.36* 1.76*  CALCIUM 8.7*   < > 8.4* 8.3* 8.0*  PROT 6.9  --   --   --   --   ALBUMIN 2.8*  --   --   --   --   AST 39  --   --   --   --   ALT 15  --   --   --   --   ALKPHOS 69  --   --   --   --   BILITOT 0.9  --   --   --   --   GFRNONAA 45*   < > 43* 37* 27*  ANIONGAP 11   < > '9 12 13   '$ < > = values in this interval not displayed.    Lipids No results for input(s): "CHOL", "TRIG", "HDL", "LABVLDL", "LDLCALC", "CHOLHDL" in the last 168 hours.  Hematology Recent Labs  Lab 07/29/2022 0704 08/18/2022 1916 08/20/22 0053  WBC 21.3* 20.9* 22.3*  RBC 3.33* 3.10* 3.22*  HGB 10.1* 9.2* 9.6*  HCT 30.1* 28.7* 30.3*  MCV 90.4 92.6 94.1  MCH 30.3 29.7 29.8  MCHC 33.6 32.1 31.7  RDW 13.7 13.9 13.9  PLT 207 207 205   Thyroid No results for  input(s): "TSH", "FREET4" in the last 168 hours.  BNP Recent Labs  Lab 08/18/2022 2246 08/18/22 1606  BNP 688.1* 869.0*    DDimer No results for input(s): "DDIMER" in the last 168 hours.   Radiology    DG CHEST PORT 1 VIEW  Result Date: 08/20/2022 CLINICAL DATA:  Shortness of breath EXAM: PORTABLE CHEST 1 VIEW COMPARISON:  CXR 08/18/22 FINDINGS: Small bilateral pleural effusions unchanged from prior exam. Unchanged cardiac mediastinal contours. No pneumothorax. There is persistent heterogeneous opacities in the right-greater-than-left lung. Compared to prior exam there is slight interval increase in a hazy opacity in the peripheral aspect of the right lower lung. Visualized upper abdomen is notable for marked colonic distention, better assessed on recent prior CT abdomen and pelvis. No pneumoperitoneum. IMPRESSION: 1. Persistent heterogeneous opacities in the right-greater-than-left lung, with a new hazy opacity in the peripheral aspect of the right lower lung. This may represent atelectasis or pneumonia. 2. Unchanged small bilateral pleural effusions. 3. Marked colonic distention, better assessed on recent prior CT abdomen and pelvis. Electronically Signed   By: Marin Roberts M.D.   On: 08/20/2022 09:29   ECHOCARDIOGRAM LIMITED  Result Date: 08/07/2022    ECHOCARDIOGRAM LIMITED REPORT   Patient Name:   Ana [name] Date of Exam: 08/16/2022 Medical Rec #:  [id number]    Height:       62.0 in Accession #:    [id number]   Weight:       116.4 lb Date of Birth:  11-24-1931   BSA:          1.519 m Patient Age:    89 years     BP:           136/72 mmHg Patient Gender: F            HR:           74 bpm. Exam Location:  Inpatient Procedure: Limited Echo, Color Doppler, Limited Color Doppler, Strain Analysis  and 3D Echo Indications:    PRE-OP  History:        Patient has prior history of Echocardiogram examinations, most                 recent 03/18/2022. CHF, Previous Myocardial Infarction,                  Signs/Symptoms:Dyspnea; Risk Factors:Hypertension, Dyslipidemia                 and Non-Smoker.  Sonographer:    Wilkie Aye RVT RCS Referring Phys: [id number] Latashia Koch  IMPRESSIONS  1. Left ventricular ejection fraction, by estimation, is 55 to 60%. Left ventricular ejection fraction by 3D volume is 56 %. The left ventricle has normal function. The left ventricle has no regional wall motion abnormalities. Left ventricular diastolic  function could not be evaluated.  2. Right ventricular systolic function is mildly reduced. The right ventricular size is normal. Tricuspid regurgitation signal is inadequate for assessing PA pressure.  3. Moderate pleural effusion in the left lateral region.  4. The mitral valve is rheumatic. Mild to moderate mitral valve regurgitation. Moderate mitral annular calcification.  5. There is mild calcification of the aortic valve. There is moderate thickening of the aortic valve. Aortic valve regurgitation is mild to moderate. Comparison(s): No significant change from prior study. Prior images reviewed side by side. FINDINGS  Left Ventricle: Left ventricular ejection fraction, by estimation, is 55 to 60%. Left ventricular ejection fraction by 3D volume is 56 %. The left ventricle has normal function. The left ventricle has no regional wall motion abnormalities. Left ventricular diastolic function could not be evaluated. Right Ventricle: The right ventricular size is normal. No increase in right ventricular wall thickness. Right ventricular systolic function is mildly reduced. Tricuspid regurgitation signal is inadequate for assessing PA pressure. Right Atrium: Right atrial size was normal in size. Pericardium: Trivial pericardial effusion is present. Mitral Valve: Transmitral gradients were not assessed. 2D images suggests mild mitral stenosis. The mitral valve is rheumatic. There is moderate thickening of the anterior and posterior mitral valve leaflet(s). There is mild  calcification of the anterior  and posterior mitral valve leaflet(s). Mildly decreased mobility of the mitral valve leaflets. Moderate mitral annular calcification. Mild to moderate mitral valve regurgitation, with centrally-directed jet. Tricuspid Valve: Tricuspid valve regurgitation is mild. Aortic Valve: There is mild calcification of the aortic valve. There is moderate thickening of the aortic valve. Aortic valve regurgitation is mild to moderate. Pulmonic Valve: The pulmonic valve was not well visualized. Aorta: The aortic root and ascending aorta are structurally normal, with no evidence of dilitation. IAS/Shunts: No atrial level shunt detected by color flow Doppler. Additional Comments: There is a moderate pleural effusion in the left lateral region.  LEFT VENTRICLE PLAX 2D LVIDd:         4.00 cm LVIDs:         2.40 cm         2D LV PW:         1.20 cm         Longitudinal LV IVS:        1.20 cm         Strain                                2D Strain GLS  -14.1 %  Avg: LV Volumes (MOD) LV vol d, MOD    90.4 ml       3D Volume EF A2C:                           LV 3D EF:    Left LV vol d, MOD    68.8 ml                    ventricul A4C:                                        ar LV vol s, MOD    32.0 ml                    ejection A2C:                                        fraction LV vol s, MOD    34.8 ml                    by 3D A4C:                                        volume is LV SV MOD A2C:   58.4 ml                    56 %. LV SV MOD A4C:   68.8 ml LV SV MOD BP:    52.4 ml                                3D Volume EF:                                3D EF:        56 % RIGHT VENTRICLE RV S prime:     7.56 cm/s TAPSE (M-mode): 1.6 cm LEFT ATRIUM             Index        RIGHT ATRIUM           Index LA diam:        3.40 cm 2.24 cm/m   RA Area:     12.50 cm LA Vol (A2C):   46.5 ml 30.62 ml/m  RA Volume:   29.30 ml  19.29 ml/m LA Vol (A4C):   35.1 ml 23.11 ml/m LA Biplane  Vol: 43.2 ml 28.44 ml/m Sanda Klein MD Electronically signed by Sanda Klein MD Signature Date/Time: 08/20/2022/2:11:24 PM    Final    DG CHEST PORT 1 VIEW  Result Date: 07/27/2022 CLINICAL DATA:  Hypoxia. EXAM: PORTABLE CHEST 1 VIEW COMPARISON:  07/27/2022. FINDINGS: The heart is enlarged and the mediastinal contour stable. There is atherosclerotic calcification of the aorta. Consolidation is noted in the right upper lobe. Perihilar airspace disease is present bilaterally. There is a small left pleural effusion. No pneumothorax. Degenerative changes are noted in the thoracic spine. No acute osseous abnormality. IMPRESSION: 1. Right upper lobe consolidation with perihilar  airspace disease bilaterally, concerning for multifocal pneumonia and/or superimposed edema. 2. Small left pleural effusion. 3. Cardiomegaly. Electronically Signed   By: Brett Fairy M.D.   On: 07/28/2022 00:17    Cardiac Studies   Echo pending  Patient Profile     Ana [name] is an 83F with HFpEF, hypertension, hyperlipidemia, diabetes, atrial fibrillation/flutter, and chronic anemia admitted after being found down and noted to have a R hip fracture and upper GI bleed. Cardiology consulted for elevated troponin and pre-op evaluation.   Assessment & Plan    # Acute hypoxic respiratory failure:  # HFpEF: Aspiration PNA in the setting of hematemesis and being found down.  BNP also mildly elevated and small pleural effusions.    However renal function is worsening and she is increasingly pre-renal.  Avoid diuresis, especially as she is NPO pending swallow evaluation.  Antibiotics per primary team and critical care.   # Pre-op evaluation:  # R hip fracture:  Patient walks with walker at baseline.  She does in home exercises, cooking and cleaning without chest pain.  She does have dyspnea if she exerts herself or moves too fast.  She appears to be euvolemic on exam. She as at moderate risk given her age and co-morbidities.  Now  high risk due to her respiratory compromise.  She is not a candidate for cath given her GI bleed, and an ischemic evaluation would not change management.  Recommend proceeding with surgery once stabilized.  H/H stable this am.  Echo pending.    # PAF/flutter; Remains in sinus rhythm. INR was supra therapeutic.  Warfarin on hold and she received vitamin K.  Will stop metoprolol as she is now bradycardic in the 50s.    # HTN:  Continue to hold home losartan and amlodipine 2/2 low BP.  Metoprolol as above.       For questions or updates, please contact [place] Please consult [email or web address] for contact info under        Signed, Skeet Latch, MD  08/20/2022, 10:47 AM

## 2022-08-20 NOTE — Progress Notes (Signed)
PHARMACY NOTE:  ANTIMICROBIAL RENAL DOSAGE ADJUSTMENT  Current antimicrobial regimen includes a mismatch between antimicrobial dosage and estimated renal function.  As per policy approved by the Pharmacy & Therapeutics and Medical Executive Committees, the antimicrobial dosage will be adjusted accordingly.  Current antimicrobial dosage:  Unasyn 1.5gm q6hrs  Indication: Aspiration PNA  Renal Function:  Estimated Creatinine Clearance: 17.5 mL/min (A) (by C-G formula based on SCr of 1.76 mg/dL (H)). '[]'$      On intermittent HD, scheduled: '[]'$      On CRRT    Antimicrobial dosage has been changed to:  Unasyn 3.0gms q12hrs   Additional comments: Change made per antibiotic dosing guide based on current renal function.  Thank you for allowing pharmacy to be a part of this patient's care.  Berta Minor, [facility] 08/20/2022 4:30 PM

## 2022-08-20 NOTE — Consult Note (Signed)
NAME:  Ana [name], MRN:  [id number], DOB:  [birth year], LOS: 2 ADMISSION DATE:  08/16/2022, CONSULTATION DATE: 08/20/2022 REFERRING MD: [facility], CHIEF COMPLAINT: Hypoxia  History of Present Illness:  89 year old female who lives alone and was independent until she was found down for approximately 24 hours noted to have vomited blood aspirated and had a fractured right hip.  She was admitted to the hospital was requiring high flow nasal cannula and noninvasive mechanical ventilatory support to maintain adequate oxygenation.  She has been on broad-spectrum antibiotics and has been diuresed and is slowly improving with decreasing FiO2 needs.  Pulmonary critical care has been asked to evaluate for possible surgical intervention for repair of right hip.  We wait at least 2 more days to give her time to equilibrate and response to the current treatment which appears to be working.  Having said that she is due to her age and multiple comorbidities a surgical risk.  Pertinent  Medical History   Past Medical History:  Diagnosis Date   Anemia of chronic disease    Aortic insufficiency    moderate by echo 03/2018   Chronic diastolic CHF (congestive heart failure) ([place]) 2014   Diabetes mellitus without complication ([place])    Dizziness 02/13/2015   Hyperlipidemia    Hypertension    Hypothyroidism    MI, old 07/18/2013   Mitral stenosis    mild by echo 03/2018   Permanent atrial fibrillation (HCC)    Pulmonary HTN (HCC)    PASP 16mHg 03/2018   Urinary incontinence    Vitamin D deficiency disease      Significant Hospital Events: Including procedures, antibiotic start and stop dates in addition to other pertinent events     Interim History / Subjective:  Pulmonary asked to evaluate for surgical intervention of fractured right hip  Objective   Blood pressure (!) 111/56, pulse (!) 53, temperature (!) 97.4 F (36.3 C), temperature source Axillary, resp. rate 20, height '5\' 2"'$  (1.575 m), weight  51.5 kg, SpO2 96 %.        Intake/Output Summary (Last 24 hours) at 08/20/2022 [id number] data filed at 08/20/2022 0800 Gross per 24 hour  Intake 266.1 ml  Output 101 ml  Net 165.1 ml   Filed Weights   08/03/2022 0500 08/20/22 0037 08/20/22 0500  Weight: 52.8 kg 51.5 kg 51.5 kg    Examination: General: Frail elderly female who answers questions HENT: No JVD or lymphadenopathy is appreciated Lungs: Diminished breath sounds throughout Cardiovascular: Heart sounds are regular bradycardic in the 50s possibility that her Abdomen: Soft nontender positive bowel sounds Extremities: Mild edema Neuro: Grossly intact GU: Voids  Resolved Hospital Problem list     Assessment & Plan:  Acute hypoxic respiratory failure in the setting of having been found down for approximate 24 hours with multiple episodes of aspiration in the setting of vomiting blood.  Noted her O2 saturations are improving and her O2 demands are decreasing and is showing improvement with current interventions. Continue current interventions with empirical antimicrobial therapy with Zyvox and meropenem Continue diuresis as tolerated Continue proton pump inhibitor Continue pulmonary toilet Continue aspiration precautions Swallow evaluation As needed noninvasive mechanical ventilatory support but if she is aspirating would keep this to a minimum Would attempt to give her several more days of current interventions to continue to improve to tolerate anesthesia better Cardiology is following Palliative care is following She is full DNR  Right hip fracture Orthopedics is following for repair  Heart failure  Chronic atrial fibrillation Hypertension Cardiology is following Undergoing diuresis  GI bleed in the setting of supratherapeutic INR Lab Results  Component Value Date   INR 1.4 (H) 08/20/2022   INR 1.6 (H) 07/29/2022   INR 3.0 (H) 07/30/2022    GI consult Proton pump inhibitor Hold  anticoagulation   Hypothyroidism Synthroid  Best Practice (right click and "Reselect all SmartList Selections" daily)   Diet/type: NPO DVT prophylaxis: not indicated GI prophylaxis: PPI Lines: N/A Foley:  N/A Code Status:  DNR Last date of multidisciplinary goals of care discussion [tbd] Currently full DNR per primary Labs   CBC: Recent Labs  Lab 08/02/2022 2246 08/18/22 1347 08/18/22 1859 08/18/22 2128 08/01/2022 0704 08/07/2022 1916 08/20/22 0053  WBC 10.6*  --  20.8*  --  21.3* 20.9* 22.3*  HGB 10.8*   < > 9.6* 9.7* 10.1* 9.2* 9.6*  HCT 32.0*   < > 29.8* 28.7* 30.1* 28.7* 30.3*  MCV 91.2  --  91.7  --  90.4 92.6 94.1  PLT 214  --  207  --  207 207 205   < > = values in this interval not displayed.    Basic Metabolic Panel: Recent Labs  Lab 08/20/2022 2246 08/18/22 1347 08/11/2022 0028 08/16/2022 0704 08/20/22 0053  NA 137 140 139 139 140  K 4.2 4.3 4.6 4.3 4.5  CL 107 109 107 106 107  CO2 19* 20* 23 21* 20*  GLUCOSE 245* 126* 132* 185* 160*  BUN 23 24* 31* 36* 50*  CREATININE 1.16* 1.03* 1.22* 1.36* 1.76*  CALCIUM 8.7* 8.4* 8.4* 8.3* 8.0*   GFR: Estimated Creatinine Clearance: 17.5 mL/min (A) (by C-G formula based on SCr of 1.76 mg/dL (H)). Recent Labs  Lab 07/24/2022 2246 08/18/22 0243 08/18/22 0648 08/18/22 1347 08/18/22 1859 08/10/2022 0704 08/18/2022 1916 08/20/22 0053  PROCALCITON  --   --   --  0.15  --   --   --   --   WBC 10.6*  --   --   --  20.8* 21.3* 20.9* 22.3*  LATICACIDVEN 4.2* 3.8* 2.1* 1.6  --   --   --   --     Liver Function Tests: Recent Labs  Lab 08/16/2022 2246  AST 39  ALT 15  ALKPHOS 69  BILITOT 0.9  PROT 6.9  ALBUMIN 2.8*   No results for input(s): "LIPASE", "AMYLASE" in the last 168 hours. No results for input(s): "AMMONIA" in the last 168 hours.  ABG    Component Value Date/Time   PHART 7.37 07/29/2022 0400   PCO2ART 36 08/09/2022 0400   PO2ART 68 (L) 07/24/2022 0400   HCO3 20.9 08/16/2022 0400   TCO2 18 (L)  08/07/2022 2245   ACIDBASEDEF 4.0 (H) 08/10/2022 0400   O2SAT 96.3 08/15/2022 0400     Coagulation Profile: Recent Labs  Lab 08/13/2022 2246 07/28/2022 0028 08/20/22 0053  INR 3.0* 1.6* 1.4*    Cardiac Enzymes: Recent Labs  Lab 08/16/2022 2246 08/06/2022 0028  CKTOTAL 379* 670*    HbA1C: Hgb A1c MFr Bld  Date/Time Value Ref Range Status  08/18/2022 07:20 AM 6.1 (H) 4.8 - 5.6 % Final    Comment:    (NOTE)         Prediabetes: 5.7 - 6.4         Diabetes: >6.4         Glycemic control for adults with diabetes: <7.0   01/14/2014 04:45 PM 7.5 (H) <5.7 % Final    Comment:    (  NOTE)                                                                       According to the ADA Clinical Practice Recommendations for 2011, when HbA1c is used as a screening test:  >=6.5%   Diagnostic of Diabetes Mellitus           (if abnormal result is confirmed) 5.7-6.4%   Increased risk of developing Diabetes Mellitus References:Diagnosis and Classification of Diabetes Mellitus,Diabetes [id number] 1):S62-S69 and Standards of Medical Care in         Diabetes - 2011,Diabetes [id number] (Suppl 1):S11-S61.    CBG: Recent Labs  Lab 08/03/2022 1403 08/16/2022 1551 08/10/2022 1753 08/20/22 0044 08/20/22 0534  GLUCAP 81 99 103* 151* 120*    Review of Systems:   na  Past Medical History:  She,  has a past medical history of Anemia of chronic disease, Aortic insufficiency, Chronic diastolic CHF (congestive heart failure) ([place]) (2014), Diabetes mellitus without complication ([place]), Dizziness (02/13/2015), Hyperlipidemia, Hypertension, Hypothyroidism, MI, old (07/18/2013), Mitral stenosis, Permanent atrial fibrillation ([place]), Pulmonary HTN ([place]), Urinary incontinence, and Vitamin D deficiency disease.   Surgical History:   Past Surgical History:  Procedure Laterality Date   CATARACT EXTRACTION     LAPAROSCOPIC APPENDECTOMY N/A 04/14/2017   Procedure: APPENDECTOMY LAPAROSCOPIC;  Surgeon: Fanny Skates, MD;  Location: [facility] ORS;  Service: General;  Laterality: N/A;   LEFT HEART CATHETERIZATION WITH CORONARY ANGIOGRAM N/A 01/17/2014   Procedure: LEFT HEART CATHETERIZATION WITH CORONARY ANGIOGRAM;  Surgeon: Troy Sine, MD;  Location[facility] C[facility] LAB;  Service: Cardiovascular;  Laterality: N/A;   PERCUTANEOUS CORONARY STENT INTERVENTION (PCI-S)  01/17/2014   Procedure: PERCUTANEOUS CORONARY STENT INTERVENTION (PCI-S);  Surgeon: Troy Sine, MD;  Location: [facility] CATH LAB;  Service: Cardiovascular;;   PERCUTANEOUS CORONARY STENT INTERVENTION (PCI-S) N/A 01/19/2014   Procedure: PERCUTANEOUS CORONARY STENT INTERVENTION (PCI-S);  Surgeon: Leonie Man, MD;  Location: [facility] CATH LAB;  Service: Cardiovascular;  Laterality: N/A;   TOTAL HIP ARTHROPLASTY Right 08/09/2022   Procedure: RIGHT TOTAL HIP ARTHROPLASTY VS HEMIARTHROPLASTY (ANTERIOR APPROACH);  Surgeon: Meredith Pel, MD;  Location: [facility];  Service: Orthopedics;  Laterality: Right;     Social History:   reports that she has never smoked. She has never used smokeless tobacco. She reports that she does not drink alcohol and does not use drugs.   Family History:  Her family history includes Cancer in her mother; Cancer (age of onset: 33) in her sister and another family member; Cirrhosis in her father; Diabetes in her brother; Heart attack in her sister; Heart disease in her sister.   Allergies Allergies  Allergen Reactions   Welchol [Colesevelam Hcl] Nausea Only   Zetia [Ezetimibe] Other (See Comments)    Edema    Crestor [Rosuvastatin] Other (See Comments)    Unknown   Statins Other (See Comments)    Unknown   Zocor [Simvastatin] Other (See Comments)    Muscle weakness     Home Medications  Prior to Admission medications   Medication Sig Start Date End Date Taking? Authorizing Provider  amLODipine (NORVASC) 5 MG tablet TAKE 1 TABLET BY MOUTH ONCE DAILY. MUST  KEEP  UPCOMING  APPOINTMENT  IN  [place]  2023  FOR  ADDITIONAL   REFILLS Patient taking differently: Take 5 mg by mouth daily. 03/05/22  Yes Turner, Eber Hong, MD  Cholecalciferol (VITAMIN D3) 2000 UNITS TABS Take 2,000 Units by mouth every evening.   Yes [provider]  Ferrous Sulfate (SLOW FE PO) Take 1 tablet by mouth daily.   Yes [provider]  levothyroxine (SYNTHROID) 50 MCG tablet Take 50 mcg by mouth daily. 12/27/21  Yes [provider]  losartan (COZAAR) 100 MG tablet Take 1 tablet by mouth once daily 03/04/22  Yes Turner, Eber Hong, MD  metFORMIN (GLUCOPHAGE) 1000 MG tablet Take 1 tablet (1,000 mg total) by mouth 2 (two) times daily with a meal. Patient taking differently: Take 1,000 mg by mouth daily. 02/16/14  Yes Turner, Eber Hong, MD  pravastatin (PRAVACHOL) 40 MG tablet TAKE 1 TABLET BY MOUTH ONCE DAILY IN THE EVENING 03/04/22  Yes Turner, Eber Hong, MD  vitamin B-12 (CYANOCOBALAMIN) 1000 MCG tablet Take 1,000 mcg by mouth daily.   Yes [provider]  warfarin (COUMADIN) 2.5 MG tablet Take 1/2 tablet daily except 1 tablet on Monday and Friday or as directed by Anticoagulation Clinic. Patient taking differently: Take 2.5 mg by mouth See admin instructions. 1 tablet on Monday and Friday.  Tab on Tue, Wed, Thu, Sat, Sun. 05/30/22  Yes Sueanne Margarita, MD  acetaminophen (TYLENOL) 500 MG tablet Take 500 mg by mouth every 8 (eight) hours as needed for moderate pain or headache.  Patient not taking: Reported on 08/18/2022    [provider]  aspirin EC 81 MG tablet Take 81 mg by mouth daily. Patient not taking: Reported on 08/18/2022    [provider]  Coenzyme Q10 200 MG capsule Take 200 mg by mouth daily. Patient not taking: Reported on 08/18/2022    [provider]  Omega-3 Krill Oil 500 MG CAPS Take 500 mg by mouth daily.  Patient not taking: Reported on 08/18/2022    [provider]  phenylephrine-shark liver oil-mineral oil-petrolatum (PREPARATION H) 0.25-3-14-71.9 % rectal ointment  Place 1 application rectally 2 (two) times daily as needed for hemorrhoids. Patient not taking: Reported on 08/18/2022    [provider]  polyethylene glycol (MIRALAX / GLYCOLAX) packet Take 17 g by mouth daily as needed for moderate constipation.  Patient not taking: Reported on 08/18/2022    [provider]     Critical care time: Ferol Luz Shauntay Brunelli ACNP Acute Care Nurse Practitioner [place] Please consult Amion 08/20/2022, 9:27 AM

## 2022-08-20 NOTE — TOC Progression Note (Signed)
Transition of Care [facility]) - Progression Note    Patient Details  Name: Ana [name] MRN: [id number] Date of Birth: 1932/03/01  Transition of Care [facility] CM/SW Contact  Zenon Mayo, RN Phone Number: 08/20/2022, 2:53 PM  Clinical Narrative:    from home, femur fracture, 10 liters HFNC, r hip pain, asp pna, iv abx.  TOC following.        Expected Discharge Plan and Services                                               Social Determinants of Health (SDOH) Interventions SDOH Screenings   Food Insecurity: No Food Insecurity (08/18/2022)  Housing: Low Risk  (08/18/2022)  Transportation Needs: No Transportation Needs (08/18/2022)  Utilities: Not At Risk (08/18/2022)  Tobacco Use: Low Risk  (08/20/2022)    Readmission Risk Interventions     No data to display

## 2022-08-20 NOTE — Progress Notes (Signed)
Pharmacy Antibiotic Note  Ana [name] is a 89 y.o. female admitted on 08/10/2022 with sepsis.  Pharmacy has been consulted for Meropenem dosing. Also continuing on linezolid per MD. Renal function worsening - SCr up to 1.76 today.  Plan: Adjust meropenem to '500mg'$  IV q12h for worsening renal function Linezolid '600mg'$  IV q12h per MD Monitor clinical progress, c/s, renal function, Hgb/plt trend F/u de-escalation plan/LOT   Height: '5\' 2"'$  (157.5 cm) Weight: 51.5 kg (113 lb 8.6 oz) IBW/kg (Calculated) : 50.1  Temp (24hrs), Avg:97.7 F (36.5 C), Min:97 F (36.1 C), Max:98.4 F (36.9 C)  Recent Labs  Lab 08/01/2022 2246 08/18/22 0243 08/18/22 0648 08/18/22 1347 08/18/22 1859 08/16/2022 0028 07/29/2022 0704 07/30/2022 1916 08/20/22 0053  WBC 10.6*  --   --   --  20.8*  --  21.3* 20.9* 22.3*  CREATININE 1.16*  --   --  1.03*  --  1.22* 1.36*  --  1.76*  LATICACIDVEN 4.2* 3.8* 2.1* 1.6  --   --   --   --   --      Estimated Creatinine Clearance: 17.5 mL/min (A) (by C-G formula based on SCr of 1.76 mg/dL (H)).    Allergies  Allergen Reactions   Welchol [Colesevelam Hcl] Nausea Only   Zetia [Ezetimibe] Other (See Comments)    Edema    Crestor [Rosuvastatin] Other (See Comments)    Unknown   Statins Other (See Comments)    Unknown   Zocor [Simvastatin] Other (See Comments)    Muscle weakness    Arturo Morton, PharmD, BCPS Please check AMION for all [place] contact numbers Clinical Pharmacist 08/20/2022 9:03 AM

## 2022-08-20 NOTE — Progress Notes (Addendum)
[facility] PROGRESS NOTE    Progress Note  Ana [name]  [id number] DOB: 1932-05-28 DOA: 07/24/2022 PCP: London Pepper, MD     Brief Narrative:   Ana [name] is an 89 y.o. female past medical history of diabetes mellitus, essential hypertension hypothyroidism, permanent atrial fibrillation on Coumadin and pulmonary hypertension who family members found her on the floor brought into the ED while in the ED developed coffee-ground emesis, with a supratherapeutic INR of 3, FOBT positive hemoglobin of 11, was complaining of right hip pain x-ray showed acute fracture of the right proximal femur, x-ray of the knee showed degenerative changes, CT of the head showed no acute findings chronic ischemic microangiopathy, C-spine showed no fracture or dislocation  Assessment/Plan:   Acute respiratory failure with hypoxia possible aspiration pneumonia: Now on 10L HFNC, breathing about 10-12 times per minute and no acute distress. She was started on IV Lasix keep her on the dry side with slight improvement on her saturations, cr bump. I's and O's are poorly recorded. Use Haldol IV as needed for agitation Antibiotic coverage was broadened to meropenem and linezolid. Continues to remain afebrile with elevated white count. She is at very high risk of reaspiration, agitation and delirium. Son met with palliative care, they are hopeful we can proceed with surgical intervention. I am concerned she might be developing ARDS vs ongoing aspiration. Awaiting speech eval. Repeat chest x-ray today, still has diffuse crackles physical exam. Patient has an extremely poor prognosis, I have conveyed this to the son but he would like to proceed with surgical intervention she remains a DNR/DNI.  Femur fracture, right (HCC) Became hypoxic overnight we will have to delay surgical intervention. Continue narcotics and Robaxin. Cardiology was consulted who recommended to proceeding with surgical intervention  when his hematemesis has been resolved.  2D echo showed an EF of 50% no regional wall motion abnormalities.  Mitral valve is rheumatic mild to moderate mitral regurgitation and annular calcification, and mild aortic regurg.  Coffee-ground emesis/upper GI bleed/symptomatic anemia: With a previous hemoglobin of 10.2, this morning hemoglobin is 9.6. She is status post  FFP and vitamin K, Hold aspirin. INR this morning is 1.4. GI has been consulted [place] was consulted agree with PPI monitor hemoglobin, hold Coumadin. They recommended optimization from a cardiopulmonary standpoint, we before proceeding with intervention.    Persistent atrial fibrillation ([place]): Coumadin held, given FFP and vitamin K. Continue metoprolol for heart rate greater than 100. INR this morning is 1.4.  Chronic diastolic CHF (congestive heart failure) ([place]) She appears euvolemic on physical exam no JVD, negative hepatojugular reflux no lower extremity edema. I's and O's poorly recorded. She appears euvolemic this morning on physical exam.  Essential hypertension: Bp with stable. Started on Norvasc her blood pressure significantly elevated.  Acute kidney injury: Likely multifactorial with to overdiuresis.. Lasix had been stopped she was allowed to drink fluids.  DVT prophylaxis: SCD's Family Communication:Son Status is: Inpatient Remains inpatient appropriate because: acute gi bleed    Code Status:     Code Status Orders  (From admission, onward)           Start     Ordered   08/18/22 0630  Do not attempt resuscitation (DNR)  Continuous       Question Answer Comment  If patient has no pulse and is not breathing Do Not Attempt Resuscitation   If patient has a pulse and/or is breathing: Medical Treatment Goals COMFORT MEASURES: Keep clean/warm/dry, use medication by any  route; positioning, wound care and other measures to relieve pain/suffering; use oxygen, suction/manual treatment of airway  obstruction for comfort; do not transfer unless for comfort needs.   Consent: Discussion documented in EHR or advanced directives reviewed      08/18/22 0630           Code Status History     Date Active Date Inactive Code Status Order ID Comments User Context   08/18/2022 0629 08/18/2022 0630 DNR [id number]  Quintella Baton, MD ED   01/19/2014 1013 01/20/2014 2050 Full Code [id number]  Leonie Man, MD Inpatient   01/17/2014 1810 01/19/2014 1013 Full Code [id number]  Troy Sine, MD Inpatient   01/14/2014 1539 01/17/2014 1810 Full Code [id number]  Darlin Coco, MD Inpatient         IV Access:   Peripheral IV   Procedures and diagnostic studies:   ECHOCARDIOGRAM LIMITED  Result Date: 08/02/2022    ECHOCARDIOGRAM LIMITED REPORT   Patient Name:   Ana [name] Date of Exam: 08/15/2022 Medical Rec #:  [id number]    Height:       62.0 in Accession #:    [id number]   Weight:       116.4 lb Date of Birth:  [birth year]   BSA:          1.519 m Patient Age:    57 years     BP:           136/72 mmHg Patient Gender: F            HR:           74 bpm. Exam Location:  Inpatient Procedure: Limited Echo, Color Doppler, Limited Color Doppler, Strain Analysis            and 3D Echo Indications:    PRE-OP  History:        Patient has prior history of Echocardiogram examinations, most                 recent 03/18/2022. CHF, Previous Myocardial Infarction,                 Signs/Symptoms:Dyspnea; Risk Factors:Hypertension, Dyslipidemia                 and Non-Smoker.  Sonographer:    Wilkie Aye RVT RCS Referring Phys: [id number] TIFF[place] IMPRESSIONS  1. Left ventricular ejection fraction, by estimation, is 55 to 60%. Left ventricular ejection fraction by 3D volume is 56 %. The left ventricle has normal function. The left ventricle has no regional wall motion abnormalities. Left ventricular diastolic  function could not be evaluated.  2. Right ventricular systolic function is mildly reduced. The right  ventricular size is normal. Tricuspid regurgitation signal is inadequate for assessing PA pressure.  3. Moderate pleural effusion in the left lateral region.  4. The mitral valve is rheumatic. Mild to moderate mitral valve regurgitation. Moderate mitral annular calcification.  5. There is mild calcification of the aortic valve. There is moderate thickening of the aortic valve. Aortic valve regurgitation is mild to moderate. Comparison(s): No significant change from prior study. Prior images reviewed side by side. FINDINGS  Left Ventricle: Left ventricular ejection fraction, by estimation, is 55 to 60%. Left ventricular ejection fraction by 3D volume is 56 %. The left ventricle has normal function. The left ventricle has no regional wall motion abnormalities. Left ventricular diastolic function could not be evaluated. Right Ventricle: The right ventricular size is normal. No increase in  right ventricular wall thickness. Right ventricular systolic function is mildly reduced. Tricuspid regurgitation signal is inadequate for assessing PA pressure. Right Atrium: Right atrial size was normal in size. Pericardium: Trivial pericardial effusion is present. Mitral Valve: Transmitral gradients were not assessed. 2D images suggests mild mitral stenosis. The mitral valve is rheumatic. There is moderate thickening of the anterior and posterior mitral valve leaflet(s). There is mild calcification of the anterior  and posterior mitral valve leaflet(s). Mildly decreased mobility of the mitral valve leaflets. Moderate mitral annular calcification. Mild to moderate mitral valve regurgitation, with centrally-directed jet. Tricuspid Valve: Tricuspid valve regurgitation is mild. Aortic Valve: There is mild calcification of the aortic valve. There is moderate thickening of the aortic valve. Aortic valve regurgitation is mild to moderate. Pulmonic Valve: The pulmonic valve was not well visualized. Aorta: The aortic root and ascending aorta  are structurally normal, with no evidence of dilitation. IAS/Shunts: No atrial level shunt detected by color flow Doppler. Additional Comments: There is a moderate pleural effusion in the left lateral region.  LEFT VENTRICLE PLAX 2D LVIDd:         4.00 cm LVIDs:         2.40 cm         2D LV PW:         1.20 cm         Longitudinal LV IVS:        1.20 cm         Strain                                2D Strain GLS  -14.1 %                                Avg: LV Volumes (MOD) LV vol d, MOD    90.4 ml       3D Volume EF A2C:                           LV 3D EF:    Left LV vol d, MOD    68.8 ml                    ventricul A4C:                                        ar LV vol s, MOD    32.0 ml                    ejection A2C:                                        fraction LV vol s, MOD    34.8 ml                    by 3D A4C:                                        volume is LV SV MOD A2C:   58.4 ml  56 %. LV SV MOD A4C:   68.8 ml LV SV MOD BP:    52.4 ml                                3D Volume EF:                                3D EF:        56 % RIGHT VENTRICLE RV S prime:     7.56 cm/s TAPSE (M-mode): 1.6 cm LEFT ATRIUM             Index        RIGHT ATRIUM           Index LA diam:        3.40 cm 2.24 cm/m   RA Area:     12.50 cm LA Vol (A2C):   46.5 ml 30.62 ml/m  RA Volume:   29.30 ml  19.29 ml/m LA Vol (A4C):   35.1 ml 23.11 ml/m LA Biplane Vol: 43.2 ml 28.44 ml/m Sanda Klein MD Electronically signed by Sanda Klein MD Signature Date/Time: 08/20/2022/2:11:24 PM    Final    DG CHEST PORT 1 VIEW  Result Date: 08/13/2022 CLINICAL DATA:  Hypoxia. EXAM: PORTABLE CHEST 1 VIEW COMPARISON:  08/11/2022. FINDINGS: The heart is enlarged and the mediastinal contour stable. There is atherosclerotic calcification of the aorta. Consolidation is noted in the right upper lobe. Perihilar airspace disease is present bilaterally. There is a small left pleural effusion. No pneumothorax. Degenerative  changes are noted in the thoracic spine. No acute osseous abnormality. IMPRESSION: 1. Right upper lobe consolidation with perihilar airspace disease bilaterally, concerning for multifocal pneumonia and/or superimposed edema. 2. Small left pleural effusion. 3. Cardiomegaly. Electronically Signed   By: Brett Fairy M.D.   On: 08/16/2022 00:17   DG Knee Complete 4 Views Left  Result Date: 08/18/2022 CLINICAL DATA:  Recent fall with left knee discomfort and bruising EXAM: LEFT KNEE - COMPLETE 4+ VIEW COMPARISON:  10/11/2013 FINDINGS: No visible knee joint effusion. Medial compartment joint space narrowing and osteophyte formation consistent with osteoarthritis. Bony irregularity of the posterior margin of the patella consistent with chondromalacia patella. No acute traumatic finding. Extensive regional arterial calcification is present. IMPRESSION: No acute or traumatic finding. Medial compartment osteoarthritis. Chondromalacia patella. Extensive regional arterial calcification. Electronically Signed   By: Nelson Chimes M.D.   On: 08/18/2022 10:18     Medical Consultants:   None.   Subjective:    Ana [name] relates her shortness of breath is improved.  Objective:    Vitals:   08/20/22 0037 08/20/22 0443 08/20/22 0500 08/20/22 0736  BP: (!) 119/50 (!) 121/49  (!) 111/56  Pulse: 65 (!) 59  (!) 53  Resp: (!) '22 19  20  '$ Temp: (!) 97 F (36.1 C) 97.6 F (36.4 C)  (!) 97.4 F (36.3 C)  TempSrc: Oral Oral  Axillary  SpO2: 98% 98%  96%  Weight: 51.5 kg  51.5 kg   Height:       SpO2: 96 % O2 Flow Rate (L/min): 10 L/min FiO2 (%): (S) 40 % (spo2 100%)   Intake/Output Summary (Last 24 hours) at 08/20/2022 0750 Last data filed at 08/08/2022 2200 Gross per 24 hour  Intake 266.1 ml  Output 101 ml  Net 165.1 ml    Danley Danker  Weights   07/26/2022 0500 08/20/22 0037 08/20/22 0500  Weight: 52.8 kg 51.5 kg 51.5 kg    Exam: General exam: In no acute distress. Respiratory system: Good air  movement and clear to auscultation. Cardiovascular system: S1 & S2 heard, RRR. No JVD. Gastrointestinal system: Abdomen is nondistended, soft and nontender.  Extremities: No pedal edema. Skin: No rashes, lesions or ulcers Psychiatry: Judgement and insight appear normal. Mood & affect appropriate. Data Reviewed:    Labs: Basic Metabolic Panel: Recent Labs  Lab 08/12/2022 2246 08/18/22 1347 08/14/2022 0028 08/10/2022 0704 08/20/22 0053  NA 137 140 139 139 140  K 4.2 4.3 4.6 4.3 4.5  CL 107 109 107 106 107  CO2 19* 20* 23 21* 20*  GLUCOSE 245* 126* 132* 185* 160*  BUN 23 24* 31* 36* 50*  CREATININE 1.16* 1.03* 1.22* 1.36* 1.76*  CALCIUM 8.7* 8.4* 8.4* 8.3* 8.0*    GFR Estimated Creatinine Clearance: 17.5 mL/min (A) (by C-G formula based on SCr of 1.76 mg/dL (H)). Liver Function Tests: Recent Labs  Lab 08/02/2022 2246  AST 39  ALT 15  ALKPHOS 69  BILITOT 0.9  PROT 6.9  ALBUMIN 2.8*    No results for input(s): "LIPASE", "AMYLASE" in the last 168 hours. No results for input(s): "AMMONIA" in the last 168 hours. Coagulation profile Recent Labs  Lab 08/01/2022 2246 08/16/2022 0028 08/20/22 0053  INR 3.0* 1.6* 1.4*    COVID-19 Labs  No results for input(s): "DDIMER", "FERRITIN", "LDH", "CRP" in the last 72 hours.  No results found for: "SARSCOV2NAA"  CBC: Recent Labs  Lab 08/07/2022 2246 08/18/22 1347 08/18/22 1859 08/18/22 2128 08/11/2022 0704 08/15/2022 1916 08/20/22 0053  WBC 10.6*  --  20.8*  --  21.3* 20.9* 22.3*  HGB 10.8*   < > 9.6* 9.7* 10.1* 9.2* 9.6*  HCT 32.0*   < > 29.8* 28.7* 30.1* 28.7* 30.3*  MCV 91.2  --  91.7  --  90.4 92.6 94.1  PLT 214  --  207  --  207 207 205   < > = values in this interval not displayed.    Cardiac Enzymes: Recent Labs  Lab 08/11/2022 2246 07/24/2022 0028  CKTOTAL 379* 670*    BNP (last 3 results) Recent Labs    02/19/22 1456  PROBNP 1,947*    CBG: Recent Labs  Lab 08/03/2022 1403 07/29/2022 1551 08/03/2022 1753  08/20/22 0044 08/20/22 0534  GLUCAP 81 99 103* 151* 120*    D-Dimer: No results for input(s): "DDIMER" in the last 72 hours. Hgb A1c: Recent Labs    08/18/22 0720  HGBA1C 6.1*    Lipid Profile: No results for input(s): "CHOL", "HDL", "LDLCALC", "TRIG", "CHOLHDL", "LDLDIRECT" in the last 72 hours. Thyroid function studies: No results for input(s): "TSH", "T4TOTAL", "T3FREE", "THYROIDAB" in the last 72 hours.  Invalid input(s): "FREET3" Anemia work up: No results for input(s): "VITAMINB12", "FOLATE", "FERRITIN", "TIBC", "IRON", "RETICCTPCT" in the last 72 hours. Sepsis Labs: Recent Labs  Lab 08/08/2022 2246 08/18/22 0243 08/18/22 0648 08/18/22 1347 08/18/22 1859 08/10/2022 0704 07/24/2022 1916 08/20/22 0053  PROCALCITON  --   --   --  0.15  --   --   --   --   WBC 10.6*  --   --   --  20.8* 21.3* 20.9* 22.3*  LATICACIDVEN 4.2* 3.8* 2.1* 1.6  --   --   --   --     Microbiology Recent Results (from the past 240 hour(s))  Surgical PCR screen  Status: None   Collection Time: 08/18/22  3:44 PM   Specimen: Nasal Mucosa; Nasal Swab  Result Value Ref Range Status   MRSA, PCR NEGATIVE NEGATIVE Final   Staphylococcus aureus NEGATIVE NEGATIVE Final    Comment: (NOTE) The Xpert SA Assay (FDA approved for NASAL specimens in patients 40 years of age and older), is one component of a comprehensive surveillance program. It is not intended to diagnose infection nor to guide or monitor treatment. Performed at [facility], [address], [place], [place] [address]      Medications:    amLODipine  5 mg Oral Daily   insulin aspart  0-15 Units Subcutaneous Q6H   levothyroxine  50 mcg Oral Daily   melatonin  3 mg Oral QHS   metoprolol tartrate  25 mg Oral BID   pantoprazole (PROTONIX) IV  40 mg Intravenous BID   Continuous Infusions:  sodium chloride 10 mL/hr at 07/29/2022 1648   linezolid (ZYVOX) IV 600 mg (08/20/22 0227)   meropenem (MERREM) IV 1 g (08/20/22 0410)       LOS: 2 days   Charlynne Cousins  [facility]  08/20/2022, 7:50 AM

## 2022-08-21 ENCOUNTER — Inpatient Hospital Stay (HOSPITAL_COMMUNITY): Payer: Medicare HMO

## 2022-08-21 ENCOUNTER — Encounter (HOSPITAL_COMMUNITY): Admission: EM | Disposition: E | Payer: Self-pay | Source: Home / Self Care | Attending: Internal Medicine

## 2022-08-21 DIAGNOSIS — I480 Paroxysmal atrial fibrillation: Secondary | ICD-10-CM | POA: Diagnosis not present

## 2022-08-21 DIAGNOSIS — I4819 Other persistent atrial fibrillation: Secondary | ICD-10-CM | POA: Diagnosis not present

## 2022-08-21 DIAGNOSIS — I5032 Chronic diastolic (congestive) heart failure: Secondary | ICD-10-CM | POA: Diagnosis not present

## 2022-08-21 DIAGNOSIS — S72051A Unspecified fracture of head of right femur, initial encounter for closed fracture: Secondary | ICD-10-CM | POA: Diagnosis not present

## 2022-08-21 DIAGNOSIS — J690 Pneumonitis due to inhalation of food and vomit: Secondary | ICD-10-CM | POA: Diagnosis not present

## 2022-08-21 DIAGNOSIS — J9601 Acute respiratory failure with hypoxia: Secondary | ICD-10-CM | POA: Diagnosis not present

## 2022-08-21 DIAGNOSIS — N179 Acute kidney failure, unspecified: Secondary | ICD-10-CM | POA: Diagnosis not present

## 2022-08-21 DIAGNOSIS — I2510 Atherosclerotic heart disease of native coronary artery without angina pectoris: Secondary | ICD-10-CM | POA: Diagnosis not present

## 2022-08-21 LAB — GLUCOSE, CAPILLARY
Glucose-Capillary: 164 mg/dL — ABNORMAL HIGH (ref 70–99)
Glucose-Capillary: 174 mg/dL — ABNORMAL HIGH (ref 70–99)
Glucose-Capillary: 83 mg/dL (ref 70–99)
Glucose-Capillary: 88 mg/dL (ref 70–99)

## 2022-08-21 LAB — BASIC METABOLIC PANEL
Anion gap: 11 (ref 5–15)
BUN: 64 mg/dL — ABNORMAL HIGH (ref 8–23)
CO2: 20 mmol/L — ABNORMAL LOW (ref 22–32)
Calcium: 7.6 mg/dL — ABNORMAL LOW (ref 8.9–10.3)
Chloride: 108 mmol/L (ref 98–111)
Creatinine, Ser: 2.67 mg/dL — ABNORMAL HIGH (ref 0.44–1.00)
GFR, Estimated: 17 mL/min — ABNORMAL LOW (ref >60)
Glucose, Bld: 77 mg/dL (ref 70–99)
Potassium: 4.4 mmol/L (ref 3.5–5.1)
Sodium: 139 mmol/L (ref 135–145)

## 2022-08-21 LAB — PROTIME-INR
INR: 1.8 — ABNORMAL HIGH (ref 0.8–1.2)
Prothrombin Time: 20.9 seconds — ABNORMAL HIGH (ref 11.4–15.2)

## 2022-08-21 LAB — BRAIN NATRIURETIC PEPTIDE: B Natriuretic Peptide: 333.6 pg/mL — ABNORMAL HIGH (ref 0.0–100.0)

## 2022-08-21 SURGERY — HEMIARTHROPLASTY, HIP, DIRECT ANTERIOR APPROACH, FOR FRACTURE
Anesthesia: Choice | Laterality: Right

## 2022-08-21 MED ORDER — POVIDONE-IODINE 10 % EX SWAB: 2.0000 | Freq: Once | CUTANEOUS | Status: DC

## 2022-08-21 MED ORDER — CEFAZOLIN SODIUM-DEXTROSE 2-4 GM/100ML-% IV SOLN: 2.0000 g | INTRAVENOUS | Status: AC

## 2022-08-21 MED ORDER — ACETAMINOPHEN 500 MG PO TABS: 1000.0000 mg | ORAL_TABLET | Freq: Once | ORAL | Status: DC

## 2022-08-21 MED ORDER — TRANEXAMIC ACID-NACL 1000-0.7 MG/100ML-% IV SOLN: 1000.0000 mg | INTRAVENOUS | Status: DC

## 2022-08-21 MED ORDER — POLYETHYLENE GLYCOL 3350 17 G PO PACK
17.0000 g | PACK | Freq: Every day | ORAL | Status: DC
  Administered 2022-08-21 – 2022-08-22 (×2): 17 g via ORAL
  Filled 2022-08-21 (×2): qty 1

## 2022-08-21 MED ORDER — PANTOPRAZOLE SODIUM 40 MG IV SOLR
40.0000 mg | Freq: Two times a day (BID) | INTRAVENOUS | Status: DC
  Administered 2022-08-21 – 2022-08-24 (×6): 40 mg via INTRAVENOUS
  Filled 2022-08-21 (×6): qty 10

## 2022-08-21 MED ORDER — CHLORHEXIDINE GLUCONATE 4 % EX LIQD: 60.0000 mL | Freq: Once | CUTANEOUS | Status: DC

## 2022-08-21 MED ORDER — SODIUM CHLORIDE 0.9 % IV SOLN: INTRAVENOUS | Status: AC

## 2022-08-21 NOTE — Progress Notes (Signed)
[facility] PROGRESS NOTE    Progress Note  Ana [name]  [id number] DOB: [birth year] DOA: 08/18/2022 PCP: London Pepper, MD     Brief Narrative:   Ana [name] is an 89 y.o. female past medical history of diabetes mellitus, essential hypertension hypothyroidism, permanent atrial fibrillation on Coumadin and pulmonary hypertension who family members found her on the floor brought into the ED while in the ED developed coffee-ground emesis, with a supratherapeutic INR of 3, FOBT positive hemoglobin of 11, was complaining of right hip pain x-ray showed acute fracture of the right proximal femur, x-ray of the knee showed degenerative changes, CT of the head showed no acute findings chronic ischemic microangiopathy, C-spine showed no fracture or dislocation.  08/10/2022: The patient was seen and examined at bedside with in a lot of pain.  IV Dilaudid administered.  Currently on 6 L to maintain O2 saturation greater than 92%.  Assessment/Plan:   Acute respiratory failure with hypoxia possible aspiration pneumonia, refractory: Now on 6 L high flow nasal cannula to maintain a saturation greater than 92%. She was started on IV Lasix keep her on the dry side with slight improvement on her saturations, cr bump. I's and O's are poorly recorded. Use Haldol IV as needed for agitation Antibiotic coverage was broadened to meropenem and linezolid. Continues to remain afebrile with elevated white count. She is at very high risk of reaspiration, agitation and delirium. Son met with palliative care, they are hopeful we can proceed with surgical intervention. I am concerned she might be developing ARDS vs ongoing aspiration. Awaiting speech eval. Repeat chest x-ray today, still has diffuse crackles physical exam. Patient has an extremely poor prognosis, I have conveyed this to the son but he would like to proceed with surgical intervention she remains a DNR/DNI.  Femur fracture, right  (HCC) Became hypoxic overnight we will have to delay surgical intervention. Continue narcotics and Robaxin. Cardiology was consulted who recommended to proceeding with surgical intervention when his hematemesis has been resolved.  2D echo showed an EF of 50% no regional wall motion abnormalities.  Mitral valve is rheumatic mild to moderate mitral regurgitation and annular calcification, and mild aortic regurg.  Coffee-ground emesis/upper GI bleed/symptomatic anemia: With a previous hemoglobin of 10.2, this morning hemoglobin is 9.6. She is status post  FFP and vitamin K, Hold aspirin. INR this morning is 1.4. GI has been consulted [place] was consulted agree with PPI monitor hemoglobin, hold Coumadin. They recommended optimization from a cardiopulmonary standpoint, we before proceeding with intervention.    Persistent atrial fibrillation ([place]): Coumadin held, given FFP and vitamin K. Continue metoprolol for heart rate greater than 100. INR this morning is 1.4.  Chronic diastolic CHF (congestive heart failure) ([place]) She appears euvolemic on physical exam no JVD, negative hepatojugular reflux no lower extremity edema. I's and O's poorly recorded. She appears euvolemic this morning on physical exam.  Essential hypertension: Bp with stable. Started on Norvasc her blood pressure significantly elevated.  Acute kidney injury, worsening: Likely multifactorial with to overdiuresis.. Lasix had been stopped she was allowed to drink fluids.  DVT prophylaxis: SCD's Family Communication:Son Status is: Inpatient Remains inpatient appropriate because: acute gi bleed    Code Status:     Code Status Orders  (From admission, onward)           Start     Ordered   08/18/22 0630  Do not attempt resuscitation (DNR)  Continuous       Question Answer Comment  If patient has no pulse and is not breathing Do Not Attempt Resuscitation   If patient has a pulse and/or is breathing: Medical  Treatment Goals COMFORT MEASURES: Keep clean/warm/dry, use medication by any route; positioning, wound care and other measures to relieve pain/suffering; use oxygen, suction/manual treatment of airway obstruction for comfort; do not transfer unless for comfort needs.   Consent: Discussion documented in EHR or advanced directives reviewed      08/18/22 0630           Code Status History     Date Active Date Inactive Code Status Order ID Comments User Context   08/18/2022 0629 08/18/2022 0630 DNR [id number]  Quintella Baton, MD ED   01/19/2014 1013 01/20/2014 2050 Full Code [id number]  Leonie Man, MD Inpatient   01/17/2014 1810 01/19/2014 1013 Full Code [id number]  Troy Sine, MD Inpatient   01/14/2014 1539 01/17/2014 1810 Full Code [id number]  Darlin Coco, MD Inpatient         IV Access:   Peripheral IV   Procedures and diagnostic studies:   DG CHEST PORT 1 VIEW  Result Date: 08/20/2022 CLINICAL DATA:  Shortness of breath EXAM: PORTABLE CHEST 1 VIEW COMPARISON:  CXR 08/18/22 FINDINGS: Small bilateral pleural effusions unchanged from prior exam. Unchanged cardiac mediastinal contours. No pneumothorax. There is persistent heterogeneous opacities in the right-greater-than-left lung. Compared to prior exam there is slight interval increase in a hazy opacity in the peripheral aspect of the right lower lung. Visualized upper abdomen is notable for marked colonic distention, better assessed on recent prior CT abdomen and pelvis. No pneumoperitoneum. IMPRESSION: 1. Persistent heterogeneous opacities in the right-greater-than-left lung, with a new hazy opacity in the peripheral aspect of the right lower lung. This may represent atelectasis or pneumonia. 2. Unchanged small bilateral pleural effusions. 3. Marked colonic distention, better assessed on recent prior CT abdomen and pelvis. Electronically Signed   By: Marin Roberts M.D.   On: 08/20/2022 09:29     Medical Consultants:    None.     Objective:    Vitals:   08/02/2022 0708 07/27/2022 0907 08/14/2022 1100 08/01/2022 1231  BP: (!) 133/53   (!) 107/53  Pulse: 65 64 (!) 57 61  Resp: (!) 21 (!) '26 16 19  '$ Temp: 97.6 F (36.4 C)   97.6 F (36.4 C)  TempSrc: Axillary   Axillary  SpO2: 92% (!) 86% 99% 94%  Weight:  52.5 kg    Height:       SpO2: 94 % O2 Flow Rate (L/min): 6 L/min FiO2 (%): (S) 40 % (spo2 100%)   Intake/Output Summary (Last 24 hours) at 07/29/2022 1601 Last data filed at 08/12/2022 1500 Gross per 24 hour  Intake 1196.29 ml  Output 250 ml  Net 946.29 ml   Filed Weights   08/20/22 0500 07/25/2022 0018 08/14/2022 0907  Weight: 51.5 kg 50.6 kg 52.5 kg    Exam: General exam: Frail-appearing, uncomfortable due to pain. Respiratory system: Mild diffuse rales bilaterally.   Cardiovascular system: S regular rate and rhythm no rubs or gallops. Gastrointestinal system: Soft nondistended bowel sounds present.  Extremities: No pedal edema bilaterally.   Skin: No rashes, lesions or ulcers Psychiatry: Mood is appropriate for condition and setting. Data Reviewed:    Labs: Basic Metabolic Panel: Recent Labs  Lab 08/18/22 1347 07/25/2022 0028 08/13/2022 0704 08/20/22 0053 08/10/2022 0029  NA 140 139 139 140 139  K 4.3 4.6 4.3 4.5 4.4  CL 109 107 106 107  108  CO2 20* 23 21* 20* 20*  GLUCOSE 126* 132* 185* 160* 77  BUN 24* 31* 36* 50* 64*  CREATININE 1.03* 1.22* 1.36* 1.76* 2.67*  CALCIUM 8.4* 8.4* 8.3* 8.0* 7.6*   GFR Estimated Creatinine Clearance: 11.5 mL/min (A) (by C-G formula based on SCr of 2.67 mg/dL (H)). Liver Function Tests: Recent Labs  Lab 07/26/2022 2246  AST 39  ALT 15  ALKPHOS 69  BILITOT 0.9  PROT 6.9  ALBUMIN 2.8*   No results for input(s): "LIPASE", "AMYLASE" in the last 168 hours. No results for input(s): "AMMONIA" in the last 168 hours. Coagulation profile Recent Labs  Lab 08/16/2022 2246 08/16/2022 0028 08/20/22 0053 08/16/2022 0029  INR 3.0* 1.6* 1.4* 1.8*    COVID-19 Labs  No results for input(s): "DDIMER", "FERRITIN", "LDH", "CRP" in the last 72 hours.  No results found for: "SARSCOV2NAA"  CBC: Recent Labs  Lab 08/18/22 1859 08/18/22 2128 08/20/2022 0704 08/01/2022 1916 08/20/22 0053 08/20/22 1840  WBC 20.8*  --  21.3* 20.9* 22.3* 13.2*  HGB 9.6* 9.7* 10.1* 9.2* 9.6* 9.5*  HCT 29.8* 28.7* 30.1* 28.7* 30.3* 28.3*  MCV 91.7  --  90.4 92.6 94.1 89.8  PLT 207  --  207 207 205 214   Cardiac Enzymes: Recent Labs  Lab 08/10/2022 2246 07/27/2022 0028  CKTOTAL 379* 670*   BNP (last 3 results) Recent Labs    02/19/22 1456  PROBNP 1,947*   CBG: Recent Labs  Lab 08/20/22 1122 08/20/22 1605 08/03/2022 0027 08/15/2022 0545 07/29/2022 1225  GLUCAP 142* 124* 83 88 174*   D-Dimer: No results for input(s): "DDIMER" in the last 72 hours. Hgb A1c: No results for input(s): "HGBA1C" in the last 72 hours. Lipid Profile: No results for input(s): "CHOL", "HDL", "LDLCALC", "TRIG", "CHOLHDL", "LDLDIRECT" in the last 72 hours. Thyroid function studies: No results for input(s): "TSH", "T4TOTAL", "T3FREE", "THYROIDAB" in the last 72 hours.  Invalid input(s): "FREET3" Anemia work up: No results for input(s): "VITAMINB12", "FOLATE", "FERRITIN", "TIBC", "IRON", "RETICCTPCT" in the last 72 hours. Sepsis Labs: Recent Labs  Lab 07/27/2022 2246 08/18/22 0243 08/18/22 0648 08/18/22 1347 08/18/22 1859 07/31/2022 0704 08/16/2022 1916 08/20/22 0053 08/20/22 1840  PROCALCITON  --   --   --  0.15  --   --   --   --   --   WBC 10.6*  --   --   --    < > 21.3* 20.9* 22.3* 13.2*  LATICACIDVEN 4.2* 3.8* 2.1* 1.6  --   --   --   --   --    < > = values in this interval not displayed.   Microbiology Recent Results (from the past 240 hour(s))  Surgical PCR screen     Status: None   Collection Time: 08/18/22  3:44 PM   Specimen: Nasal Mucosa; Nasal Swab  Result Value Ref Range Status   MRSA, PCR NEGATIVE NEGATIVE Final   Staphylococcus aureus NEGATIVE  NEGATIVE Final    Comment: (NOTE) The Xpert SA Assay (FDA approved for NASAL specimens in patients 81 years of age and older), is one component of a comprehensive surveillance program. It is not intended to diagnose infection nor to guide or monitor treatment. Performed at [facility], [place] [address], [place], [place] [address]      Medications:    acetaminophen  1,000 mg Oral Once   amLODipine  5 mg Oral Daily   chlorhexidine  60 mL Topical Once   insulin aspart  0-15 Units Subcutaneous Q6H   levothyroxine  50 mcg Oral Daily   melatonin  3 mg Oral QHS   metoprolol succinate  12.5 mg Oral Daily   pantoprazole (PROTONIX) IV  40 mg Intravenous Q12H   polyethylene glycol  17 g Oral Daily   povidone-iodine  2 Application Topical Once   Continuous Infusions:  sodium chloride 10 mL/hr at 07/31/2022 1216   sodium chloride 100 mL/hr at 08/01/2022 1500   ampicillin-sulbactam (UNASYN) IV 3 g (08/04/2022 0842)    ceFAZolin (ANCEF) IV     tranexamic acid        LOS: 3 days   Kayleen Memos  [facility]  07/25/2022, 4:01 PM

## 2022-08-21 NOTE — Progress Notes (Signed)
                                                     Palliative Care Progress Note, Assessment & Plan   Patient Name: Ana [name]       Date: 08/01/2022 DOB: Nov 28, 1931  Age: 89 y.o. MRN#: [id number] Attending Physician: Kayleen Memos, DO Primary Care Physician: London Pepper, MD Admit Date: 08/03/2022  Subjective: Patient has just returned from swallow eval.  She is moaning and groaning from pain.  Her oxygen saturation is in the low 80s.  She is complaining of 10 out of 10 pain.  RN is at bedside.  HPI: 89 y.o. female  with past medical history of HLD, HTN, hypothyroidism, not permanent A-fib, type 2 diabetes, chronic diastolic CHF, MI (123456), vitamin D deficiency, urinary incontinence and pulmonary HTN admitted on 07/26/2022 after being found down s/p unwitnessed fall at home the night before.    In ED, patient experienced ground coffee emesis with hgb of 10.8 on admission.    Radiographic imaging reveals acute fracture of right proximal femur.     Ortho, GI, and cardiology were consulted.   Orthopedics was awaiting cardiac clearance from cardiology prior to surgical intervention.  Cardiology granted clearance once hematesis stopped. However, night of 2/26 patient became hypoxic and was placed on BiPAP.  IV Lasix were initiated.  Due to hypoxic event overnight, surgical intervention has been delayed.   In light of new GI bleed, PNA, and hip fracture, PMT was consulted due to patient's poor prognosis.  Summary of counseling/coordination of care: After reviewing the patient's chart and assessing the patient at bedside, I help to situate patient in bed.  I advised that Dilaudid IV be given now to relieve patient's pain and work of breathing.  Patient shares that she is hopeful for some relief soon.  After IV Dilaudid given, I followed up with  patient/patient's RN Lysbeth Galas who shared that patient is resting comfortably.   Goal remains for patient's oxygen/respiratory status to improve for further evaluation of hip surgery. DNR remains.   PMT will continue to follow.  Physical Exam Vitals reviewed.  Constitutional:      General: She is in acute distress.  HENT:     Head: Normocephalic.     Mouth/Throat:     Mouth: Mucous membranes are moist.  Eyes:     Pupils: Pupils are equal, round, and reactive to light.  Cardiovascular:     Rate and Rhythm: Normal rate.     Pulses: Normal pulses.  Pulmonary:     Comments: SOB Skin:    General: Skin is warm and dry.     Coloration: Skin is pale.  Neurological:     Mental Status: She is oriented to person, place, and time.  Psychiatric:        Mood and Affect: Mood normal.        Behavior: Behavior normal.        Thought Content: Thought content normal.        Judgment: Judgment normal.             Total Time 25 minutes   Daleisa Halperin L. Ilsa Iha, FNP-BC Palliative Medicine Team Team Phone # [phone]

## 2022-08-21 NOTE — Progress Notes (Signed)
Rounding Note    Patient Name: Ana [name] Date of Encounter: 07/31/2022  [place] Cardiologist: None   Subjective   Feeling OK.  No CP/SOB.  Hasn't had a bowel movement in a week. Reports feeling thirsty.  Inpatient Medications    Scheduled Meds:  acetaminophen  1,000 mg Oral Once   amLODipine  5 mg Oral Daily   chlorhexidine  60 mL Topical Once   insulin aspart  0-15 Units Subcutaneous Q6H   levothyroxine  50 mcg Oral Daily   melatonin  3 mg Oral QHS   metoprolol succinate  12.5 mg Oral Daily   polyethylene glycol  17 g Oral Daily   povidone-iodine  2 Application Topical Once   Continuous Infusions:  sodium chloride 10 mL/hr at 07/25/2022 1648   sodium chloride     ampicillin-sulbactam (UNASYN) IV 3 g (08/04/2022 0842)    ceFAZolin (ANCEF) IV     tranexamic acid     PRN Meds: sodium chloride, acetaminophen **OR** acetaminophen, guaiFENesin, haloperidol lactate, hydrALAZINE, HYDROmorphone (DILAUDID) injection, ipratropium-albuterol, metoprolol tartrate, prochlorperazine, senna-docusate   Vital Signs    Vitals:   08/11/2022 0502 08/07/2022 0620 08/18/2022 0708 07/24/2022 0907  BP: (!) 109/52  (!) 133/53   Pulse:   65 64  Resp: (!) 22  (!) 21 (!) 26  Temp: (!) 97.3 F (36.3 C)  97.6 F (36.4 C)   TempSrc: Axillary  Axillary   SpO2: 100% 96% 92% (!) 86%  Weight:    52.5 kg  Height:        Intake/Output Summary (Last 24 hours) at 08/09/2022 0944 Last data filed at 07/24/2022 0915 Gross per 24 hour  Intake 491.88 ml  Output 250 ml  Net 241.88 ml      08/05/2022    9:07 AM 08/13/2022   12:18 AM 08/20/2022    5:00 AM  Last 3 Weights  Weight (lbs) 115 lb 11.9 oz 111 lb 8.8 oz 113 lb 8.6 oz  Weight (kg) 52.5 kg 50.6 kg 51.5 kg      Telemetry    Sinus rhythm.  PVCs.  - Personally Reviewed  ECG    N/a - Personally Reviewed  Physical Exam   VS:  BP (!) 133/53 (BP Location: Left Arm)   Pulse 64   Temp 97.6 F (36.4 C) (Axillary)   Resp (!) 26    Ht '5\' 2"'$  (1.575 m)   Wt 52.5 kg   SpO2 (!) 86%   BMI 21.17 kg/m  , BMI Body mass index is 21.17 kg/m. GENERAL:  Frail.  No acute distress.  HEENT: Pupils equal round and reactive, fundi not visualized, oral mucosa dry NECK:  No jugular venous distention, waveform within normal limits, carotid upstroke brisk and symmetric, no bruits, no thyromegaly LUNGS:  Bilateral rhonchi improving HEART:  RRR.  PMI not displaced or sustained,S1 and S2 within normal limits, no S3, no S4, no clicks, no rubs, no murmurs ABD:  Flat, positive bowel sounds normal in frequency in pitch, no bruits, no rebound, no guarding, no midline pulsatile mass, no hepatomegaly, no splenomegaly EXT:  2 plus pulses throughout, no edema, no cyanosis no clubbing SKIN:  No rashes no nodules NEURO:  Cranial nerves II through XII grossly intact, motor grossly intact throughout PSYCH:  Cognitively intact, oriented to person place and time   Labs    High Sensitivity Troponin:   Recent Labs  Lab 08/15/2022 2246 08/18/22 0243  TROPONINIHS 16 25*  Chemistry Recent Labs  Lab 08/01/2022 2246 08/18/22 1347 07/28/2022 0704 08/20/22 0053 08/20/2022 0029  NA 137   < > 139 140 139  K 4.2   < > 4.3 4.5 4.4  CL 107   < > 106 107 108  CO2 19*   < > 21* 20* 20*  GLUCOSE 245*   < > 185* 160* 77  BUN 23   < > 36* 50* 64*  CREATININE 1.16*   < > 1.36* 1.76* 2.67*  CALCIUM 8.7*   < > 8.3* 8.0* 7.6*  PROT 6.9  --   --   --   --   ALBUMIN 2.8*  --   --   --   --   AST 39  --   --   --   --   ALT 15  --   --   --   --   ALKPHOS 69  --   --   --   --   BILITOT 0.9  --   --   --   --   GFRNONAA 45*   < > 37* 27* 17*  ANIONGAP 11   < > '12 13 11   '$ < > = values in this interval not displayed.    Lipids No results for input(s): "CHOL", "TRIG", "HDL", "LABVLDL", "LDLCALC", "CHOLHDL" in the last 168 hours.  Hematology Recent Labs  Lab 08/02/2022 1916 08/20/22 0053 08/20/22 1840  WBC 20.9* 22.3* 13.2*  RBC 3.10* 3.22* 3.15*  HGB 9.2*  9.6* 9.5*  HCT 28.7* 30.3* 28.3*  MCV 92.6 94.1 89.8  MCH 29.7 29.8 30.2  MCHC 32.1 31.7 33.6  RDW 13.9 13.9 13.9  PLT 207 205 214   Thyroid No results for input(s): "TSH", "FREET4" in the last 168 hours.  BNP Recent Labs  Lab 08/06/2022 2246 08/18/22 1606  BNP 688.1* 869.0*    DDimer No results for input(s): "DDIMER" in the last 168 hours.   Radiology    DG CHEST PORT 1 VIEW  Result Date: 08/20/2022 CLINICAL DATA:  Shortness of breath EXAM: PORTABLE CHEST 1 VIEW COMPARISON:  CXR 08/18/22 FINDINGS: Small bilateral pleural effusions unchanged from prior exam. Unchanged cardiac mediastinal contours. No pneumothorax. There is persistent heterogeneous opacities in the right-greater-than-left lung. Compared to prior exam there is slight interval increase in a hazy opacity in the peripheral aspect of the right lower lung. Visualized upper abdomen is notable for marked colonic distention, better assessed on recent prior CT abdomen and pelvis. No pneumoperitoneum. IMPRESSION: 1. Persistent heterogeneous opacities in the right-greater-than-left lung, with a new hazy opacity in the peripheral aspect of the right lower lung. This may represent atelectasis or pneumonia. 2. Unchanged small bilateral pleural effusions. 3. Marked colonic distention, better assessed on recent prior CT abdomen and pelvis. Electronically Signed   By: Marin Roberts M.D.   On: 08/20/2022 09:29   ECHOCARDIOGRAM LIMITED  Result Date: 08/02/2022    ECHOCARDIOGRAM LIMITED REPORT   Patient Name:   Ana [name] Date of Exam: 08/13/2022 Medical Rec #:  [id number]    Height:       62.0 in Accession #:    [id number]   Weight:       116.4 lb Date of Birth:  Apr 13, 1932   BSA:          1.519 m Patient Age:    89 years     BP:           136/72 mmHg Patient Gender: F  HR:           74 bpm. Exam Location:  Inpatient Procedure: Limited Echo, Color Doppler, Limited Color Doppler, Strain Analysis            and 3D Echo Indications:     PRE-OP  History:        Patient has prior history of Echocardiogram examinations, most                 recent 03/18/2022. CHF, Previous Myocardial Infarction,                 Signs/Symptoms:Dyspnea; Risk Factors:Hypertension, Dyslipidemia                 and Non-Smoker.  Sonographer:    Wilkie Aye RVT RCS Referring Phys: [id number] Koki Buxton Kaanapali IMPRESSIONS  1. Left ventricular ejection fraction, by estimation, is 55 to 60%. Left ventricular ejection fraction by 3D volume is 56 %. The left ventricle has normal function. The left ventricle has no regional wall motion abnormalities. Left ventricular diastolic  function could not be evaluated.  2. Right ventricular systolic function is mildly reduced. The right ventricular size is normal. Tricuspid regurgitation signal is inadequate for assessing PA pressure.  3. Moderate pleural effusion in the left lateral region.  4. The mitral valve is rheumatic. Mild to moderate mitral valve regurgitation. Moderate mitral annular calcification.  5. There is mild calcification of the aortic valve. There is moderate thickening of the aortic valve. Aortic valve regurgitation is mild to moderate. Comparison(s): No significant change from prior study. Prior images reviewed side by side. FINDINGS  Left Ventricle: Left ventricular ejection fraction, by estimation, is 55 to 60%. Left ventricular ejection fraction by 3D volume is 56 %. The left ventricle has normal function. The left ventricle has no regional wall motion abnormalities. Left ventricular diastolic function could not be evaluated. Right Ventricle: The right ventricular size is normal. No increase in right ventricular wall thickness. Right ventricular systolic function is mildly reduced. Tricuspid regurgitation signal is inadequate for assessing PA pressure. Right Atrium: Right atrial size was normal in size. Pericardium: Trivial pericardial effusion is present. Mitral Valve: Transmitral gradients were not assessed. 2D images  suggests mild mitral stenosis. The mitral valve is rheumatic. There is moderate thickening of the anterior and posterior mitral valve leaflet(s). There is mild calcification of the anterior  and posterior mitral valve leaflet(s). Mildly decreased mobility of the mitral valve leaflets. Moderate mitral annular calcification. Mild to moderate mitral valve regurgitation, with centrally-directed jet. Tricuspid Valve: Tricuspid valve regurgitation is mild. Aortic Valve: There is mild calcification of the aortic valve. There is moderate thickening of the aortic valve. Aortic valve regurgitation is mild to moderate. Pulmonic Valve: The pulmonic valve was not well visualized. Aorta: The aortic root and ascending aorta are structurally normal, with no evidence of dilitation. IAS/Shunts: No atrial level shunt detected by color flow Doppler. Additional Comments: There is a moderate pleural effusion in the left lateral region.  LEFT VENTRICLE PLAX 2D LVIDd:         4.00 cm LVIDs:         2.40 cm         2D LV PW:         1.20 cm         Longitudinal LV IVS:        1.20 cm         Strain  2D Strain GLS  -14.1 %                                Avg: LV Volumes (MOD) LV vol d, MOD    90.4 ml       3D Volume EF A2C:                           LV 3D EF:    Left LV vol d, MOD    68.8 ml                    ventricul A4C:                                        ar LV vol s, MOD    32.0 ml                    ejection A2C:                                        fraction LV vol s, MOD    34.8 ml                    by 3D A4C:                                        volume is LV SV MOD A2C:   58.4 ml                    56 %. LV SV MOD A4C:   68.8 ml LV SV MOD BP:    52.4 ml                                3D Volume EF:                                3D EF:        56 % RIGHT VENTRICLE RV S prime:     7.56 cm/s TAPSE (M-mode): 1.6 cm LEFT ATRIUM             Index        RIGHT ATRIUM           Index LA diam:        3.40  cm 2.24 cm/m   RA Area:     12.50 cm LA Vol (A2C):   46.5 ml 30.62 ml/m  RA Volume:   29.30 ml  19.29 ml/m LA Vol (A4C):   35.1 ml 23.11 ml/m LA Biplane Vol: 43.2 ml 28.44 ml/m Sanda Klein MD Electronically signed by Sanda Klein MD Signature Date/Time: 07/27/2022/2:11:24 PM    Final     Cardiac Studies   Echo 08/15/2022:  1. Left ventricular ejection fraction, by estimation, is 55 to 60%. Left  ventricular ejection fraction by 3D volume is 56 %. The left ventricle has  normal function. The left ventricle has no regional wall  motion  abnormalities. Left ventricular diastolic   function could not be evaluated.   2. Right ventricular systolic function is mildly reduced. The right  ventricular size is normal. Tricuspid regurgitation signal is inadequate  for assessing PA pressure.   3. Moderate pleural effusion in the left lateral region.   4. The mitral valve is rheumatic. Mild to moderate mitral valve  regurgitation. Moderate mitral annular calcification.   5. There is mild calcification of the aortic valve. There is moderate  thickening of the aortic valve. Aortic valve regurgitation is mild to  moderate.   Patient Profile     Ms. Laska is an 31F with HFpEF, hypertension, hyperlipidemia, diabetes, atrial fibrillation/flutter, and chronic anemia admitted after being found down and noted to have a R hip fracture and upper GI bleed. Cardiology consulted for elevated troponin and pre-op evaluation.   Assessment & Plan    # Acute hypoxic respiratory failure:  # HFpEF: # AKI: Aspiration PNA in the setting of hematemesis and being found down.  BNP also mildly elevated and small pleural effusions.    However renal function is worsening and she is increasingly pre-renal.  She is very thirsty and dry.  Will give 1L IVF over 10 hours.  This should also help her renal function.  Encouraged her to drink.  She was NPO for a long time awaiting swallow evaluation.  Now cleared for a diet.    Antibiotics per primary team and critical care.   # Pre-op evaluation:  # R hip fracture:  No further cardiac evaluation needed prior to surgery.  Respiratory status significantly improved.  Now down to 5L O2.  Now with  AKI and colonic distension.  Will give Miralax to help her move her bowels.     # PAF/flutter; Remains in sinus rhythm. INR was supra therapeutic.  Warfarin on hold and she received vitamin K.  Metoprolol reduced due to bradycardia.   # HTN:  Continue amlodipine.  Home losartan on hold.       For questions or updates, please contact [facility] Please consult [email or web address] for contact info under        Signed, Skeet Latch, MD  08/09/2022, 9:44 AM

## 2022-08-21 NOTE — Procedures (Signed)
Modified Barium Swallow Study  Patient Details  Name: Ana [name] MRN: [id number] Date of Birth: 11/25/1931  Today's Date: 08/03/2022  Modified Barium Swallow completed.  Full report located under Chart Review in the Imaging Section.  History of Present Illness 89 year old female who lives alone and was independent until she was found down for approximately 24 hours noted to have vomited blood aspirated and had a fractured right hip.  She was admitted to the hospital was requiring high flow nasal cannula and noninvasive mechanical ventilatory support to maintain adequate oxygenation. CXR 2/28: "Persistent heterogeneous opacities in the right-greater-than-left lung, with a new hazy opacity in the peripheral aspect of the right lower lung. This may represent atelectasis or pneumonia."   Clinical Impression Pt presents with a moderate-severe oropharyngeal dysphagia c/b decreased labial seal, reduced lingual strength and coordination, delayed swallow intiation, reduced base of tongue retraction, decreased laryngeal elevation and hyoid excursion, incomplete laryngeal closure, and reduced UES opening.  These deficits resulted in audible aspiration of thin liquid which was not cleared; deep, static penetration of nectar thick liquid; and transient penetration of honey thick liquid.  Penetration and aspiration occurred during the swallow.  With nectar thick liquid penetration was very difficult to clear with pt requring multiple swallows (at least 5) and repeated throat clearing, but it did appear to have been cleared prior to administration of honey thick trials.  There was trace posterior penetration of honey thick liquid during swallow which was cleared with spontaneous throat clear.  Pt unable to follow directions for oral hold of bolus prior to swallow.  Pt was unable to masticate cracker today and it was expectorated.  Pt was also unable to transit pill with honey thick liquid wash and it was removed  with digital sweep.  Pt was in pain during today's assessment which may have impacted her function.  RN reports that she has been crushing medications today which she had previously given whole in puree.  Pt was given dilaudid overnight for pain which may be affecting pt's mental staus in addition to pain interfering with attention.  Compensatory strageties were not trialed today as a result.  Pt cannot participate in swallow exercises at this time to improve swallow function.  At present pt can continue a modified oral diet.  If deficits persist, consider goals of care discussion as current diet may not be desired for long term use.    Recommend ground diet with honey thick liquid by cup.  Factors that may increase risk of adverse event in presence of aspiration ([place] 2021): Reduced cognitive function;Limited mobility  Swallow Evaluation Recommendations Recommendations: PO diet PO Diet Recommendation: Dysphagia 2 (Finely chopped);Moderately thick liquids (Level 3, honey thick) Liquid Administration via: Cup Medication Administration: Crushed with puree Supervision: Full assist for feeding Swallowing strategies  : Minimize environmental distractions;Slow rate;Small bites/sips Postural changes: Position pt fully upright for meals Oral care recommendations: Oral care BID (2x/day)    Celedonio Savage , MA, [place] Office: [phone] 08/08/2022,11:15 AM

## 2022-08-21 NOTE — Progress Notes (Addendum)
NAME:  Ana [name], MRN:  [id number], DOB:  April 30, 1932, LOS: 3 ADMISSION DATE:  08/18/2022, CONSULTATION DATE: 08/20/2022 REFERRING MD: [facility], CHIEF COMPLAINT: Hypoxia  History of Present Illness:  89 year old female who lives alone and was independent until she was found down for approximately 24 hours noted to have vomited blood aspirated and had a fractured right hip.  She was admitted to the hospital was requiring high flow nasal cannula and noninvasive mechanical ventilatory support to maintain adequate oxygenation.  She has been on broad-spectrum antibiotics and has been diuresed and is slowly improving with decreasing FiO2 needs.  Pulmonary critical care has been asked to evaluate for possible surgical intervention for repair of right hip.  We wait at least 2 more days to give her time to equilibrate and response to the current treatment which appears to be working.  Having said that she is due to her age and multiple comorbidities a surgical risk.  Pertinent  Medical History   Past Medical History:  Diagnosis Date   Anemia of chronic disease    Aortic insufficiency    moderate by echo 03/2018   Chronic diastolic CHF (congestive heart failure) ([facility]) 2014   Diabetes mellitus without complication ([facility] [place])    Dizziness 02/13/2015   Hyperlipidemia    Hypertension    Hypothyroidism    MI, old 07/18/2013   Mitral stenosis    mild by echo 03/2018   Permanent atrial fibrillation (HCC)    Pulmonary HTN (HCC)    PASP 78mHg 03/2018   Urinary incontinence    Vitamin D deficiency disease      Significant Hospital Events: Including procedures, antibiotic start and stop dates in addition to other pertinent events     Interim History / Subjective:  Continue to monitor for questionable surgery in future  Objective   Blood pressure (!) 133/53, pulse 64, temperature 97.6 F (36.4 C), temperature source Axillary, resp. rate (!) 26, height '5\' 2"'$  (1.575 m), weight 52.5 kg, SpO2 (!) 86 %.         Intake/Output Summary (Last 24 hours) at 08/12/2022 1026 Last data filed at 08/04/2022 0915 Gross per 24 hour  Intake 491.88 ml  Output 250 ml  Net 241.88 ml   Filed Weights   08/20/22 0500 07/30/2022 0018 08/02/2022 0907  Weight: 51.5 kg 50.6 kg 52.5 kg    Examination: General: Elderly female somewhat confused HEENT: MM pink/moist no JVD Neuro: Confused CV: Heart sounds are regular PULM: Decreased breath sounds throughout FiO2 is down to 5 L nasal cannula  GI: soft, bsx4 active  GU: Voids Extremities: warm/dry, 2+ edema, right leg tender Skin: no rashes or lesions   Resolved Hospital Problem list     Assessment & Plan:  Acute hypoxic respiratory failure in the setting of having been found down for approximate 24 hours with multiple episodes of aspiration in the setting of vomiting blood.  Noted her O2 saturations are improving and her O2 demands are decreasing and is showing improvement with current interventions.  Continue current interventions until improved Continue evaluate for possible surgical interventions Cardiology is following She remains a full DNR Pulmonary critical care will continue to follow     Right hip fracture Orthopedics is following for repair  Heart failure Chronic atrial fibrillation Hypertension Cardiology is following Undergoing diuresis  GI bleed in the setting of supratherapeutic INR Lab Results  Component Value Date   INR 1.8 (H) 08/12/2022   INR 1.4 (H) 08/20/2022   INR 1.6 (H)  07/29/2022    GI consult Proton pump inhibitor Hold anticoagulation   Hypothyroidism Synthroid  Best Practice (right click and "Reselect all SmartList Selections" daily)   Diet/type: NPO DVT prophylaxis: not indicated GI prophylaxis: PPI Lines: N/A Foley:  N/A Code Status:  DNR Last date of multidisciplinary goals of care discussion [tbd] Currently full DNR per primary Labs   CBC: Recent Labs  Lab 08/18/22 1859 08/18/22 2128  08/12/2022 0704 08/01/2022 1916 08/20/22 0053 08/20/22 1840  WBC 20.8*  --  21.3* 20.9* 22.3* 13.2*  HGB 9.6* 9.7* 10.1* 9.2* 9.6* 9.5*  HCT 29.8* 28.7* 30.1* 28.7* 30.3* 28.3*  MCV 91.7  --  90.4 92.6 94.1 89.8  PLT 207  --  207 207 205 [id number]    Basic Metabolic Panel: Recent Labs  Lab 08/18/22 1347 08/14/2022 0028 08/20/2022 0704 08/20/22 0053 08/09/2022 0029  NA 140 139 139 140 139  K 4.3 4.6 4.3 4.5 4.4  CL 109 107 106 107 108  CO2 20* 23 21* 20* 20*  GLUCOSE 126* 132* 185* 160* 77  BUN 24* 31* 36* 50* 64*  CREATININE 1.03* 1.22* 1.36* 1.76* 2.67*  CALCIUM 8.4* 8.4* 8.3* 8.0* 7.6*   GFR: Estimated Creatinine Clearance: 11.5 mL/min (A) (by C-G formula based on SCr of 2.67 mg/dL (H)). Recent Labs  Lab 08/05/2022 2246 08/18/22 0243 08/18/22 0648 08/18/22 1347 08/18/22 1859 08/14/2022 0704 08/03/2022 1916 08/20/22 0053 08/20/22 1840  PROCALCITON  --   --   --  0.15  --   --   --   --   --   WBC 10.6*  --   --   --    < > 21.3* 20.9* 22.3* 13.2*  LATICACIDVEN 4.2* 3.8* 2.1* 1.6  --   --   --   --   --    < > = values in this interval not displayed.    Liver Function Tests: Recent Labs  Lab 08/14/2022 2246  AST 39  ALT 15  ALKPHOS 69  BILITOT 0.9  PROT 6.9  ALBUMIN 2.8*   No results for input(s): "LIPASE", "AMYLASE" in the last 168 hours. No results for input(s): "AMMONIA" in the last 168 hours.  ABG    Component Value Date/Time   PHART 7.37 08/05/2022 0400   PCO2ART 36 08/08/2022 0400   PO2ART 68 (L) 07/25/2022 0400   HCO3 20.9 08/14/2022 0400   TCO2 18 (L) 08/01/2022 2245   ACIDBASEDEF 4.0 (H) 08/04/2022 0400   O2SAT 96.3 07/25/2022 0400     Coagulation Profile: Recent Labs  Lab 08/10/2022 2246 08/16/2022 0028 08/20/22 0053 08/18/2022 0029  INR 3.0* 1.6* 1.4* 1.8*    Cardiac Enzymes: Recent Labs  Lab 08/02/2022 2246 08/18/2022 0028  CKTOTAL 379* 670*    HbA1C: Hgb A1c MFr Bld  Date/Time Value Ref Range Status  08/18/2022 07:20 AM 6.1 (H) 4.8 - 5.6 %  Final    Comment:    (NOTE)         Prediabetes: 5.7 - 6.4         Diabetes: >6.4         Glycemic control for adults with diabetes: <7.0   01/14/2014 04:45 PM 7.5 (H) <5.7 % Final    Comment:    (NOTE)  According to the ADA Clinical Practice Recommendations for 2011, when HbA1c is used as a screening test:  >=6.5%   Diagnostic of Diabetes Mellitus           (if abnormal result is confirmed) 5.7-6.4%   Increased risk of developing Diabetes Mellitus References:Diagnosis and Classification of Diabetes Mellitus,Diabetes [id number] 1):S62-S69 and Standards of Medical Care in         Diabetes - 2011,Diabetes [id number] (Suppl 1):S11-S61.    CBG: Recent Labs  Lab 08/20/22 0534 08/20/22 1122 08/20/22 1605 08/04/2022 0027 08/18/2022 0545  GLUCAP 120* 142* 124* 83 88      Critical care time: Ferol Luz Minor ACNP Acute Care Nurse Practitioner [facility] Please consult [place] 07/27/2022, 10:26 AM   Attending:    Subjective: Son says she is more awake Oxygenation has improved Has not had a bowel movement a week  Objective: Vitals:   07/29/2022 0708 08/12/2022 0907 08/16/2022 1100 07/29/2022 1231  BP: (!) 133/53   (!) 107/53  Pulse: 65 64 (!) 57 61  Resp: (!) 21 (!) '26 16 19  '$ Temp: 97.6 F (36.4 C)   97.6 F (36.4 C)  TempSrc: Axillary   Axillary  SpO2: 92% (!) 86% 99% 94%  Weight:  52.5 kg    Height:          Intake/Output Summary (Last 24 hours) at 08/13/2022 1319 Last data filed at 08/06/2022 0915 Gross per 24 hour  Intake 491.88 ml  Output 250 ml  Net 241.88 ml    General:  frail elderly female asleep in bed HENT: NCAT mucus membranes dry PULM: Crackles bases B, normal effort CV: RRR, no mgr GI: BS+, soft, nontender MSK: normal bulk and tone Neuro: asleep, wakes up and pushes me away during exam    CBC    Component Value Date/Time   WBC 13.2 (H)  08/20/2022 1840   RBC 3.15 (L) 08/20/2022 1840   HGB 9.5 (L) 08/20/2022 1840   HGB 10.0 (L) 02/19/2022 1456   HCT 28.3 (L) 08/20/2022 1840   HCT 30.5 (L) 02/19/2022 1456   PLT 214 08/20/2022 1840   PLT 274 02/19/2022 1456   MCV 89.8 08/20/2022 1840   MCV 90 02/19/2022 1456   MCH 30.2 08/20/2022 1840   MCHC 33.6 08/20/2022 1840   RDW 13.9 08/20/2022 1840   RDW 12.3 02/19/2022 1456   LYMPHSABS 2.7 03/25/2022 1011   MONOABS 0.6 03/25/2022 1011   EOSABS 0.2 03/25/2022 1011   BASOSABS 0.1 03/25/2022 1011    BMET    Component Value Date/Time   NA 139 07/31/2022 0029   NA 140 09/25/2020 1112   K 4.4 07/27/2022 0029   CL 108 08/12/2022 0029   CO2 20 (L) 08/20/2022 0029   GLUCOSE 77 08/16/2022 0029   BUN 64 (H) 07/24/2022 0029   BUN 24 09/25/2020 1112   CREATININE 2.67 (H) 08/11/2022 0029   CREATININE 1.07 (H) 06/02/2016 1101   CALCIUM 7.6 (L) 08/06/2022 0029   GFRNONAA 17 (L) 08/16/2022 0029   GFRAA 43 (L) 04/21/2019 0926    CXR images from February 28 reviewed no change significant bilateral pulmonary infiltrates  Impression/Plan: Aspiration pneumonia with acute hypoxemic respiratory failure: Slow improvement in oxygenation, still think that it would be very difficult to send her for general anesthesia right now as there would be a very high likelihood of ventilator dependence.  Suspect her oxygenation will improve and if absolutely necessary could proceed with general anesthesia and surgical  repair of her hip.  Ideally would wait 2-3 more days.  Would be less likely to depend on mechanical ventilation if mental status improves, she starts having bowel movements, oxygenation improves. Discussed with Dr. Marlou Sa.  Updated son bedside  My cc time n/a minutes  Roselie Awkward, MD [facility] Pager: [phone] Cell: [phone] After 7pm: [phone]

## 2022-08-21 NOTE — Care Management Important Message (Signed)
Important Message  Patient Details  Name: Ana [name] MRN: [id number] Date of Birth: June 23, 1932   Medicare Important Message Given:  Yes     Shelda Altes 07/28/2022, 9:01 AM

## 2022-08-22 ENCOUNTER — Ambulatory Visit: Payer: Medicare HMO

## 2022-08-22 DIAGNOSIS — Z515 Encounter for palliative care: Secondary | ICD-10-CM | POA: Diagnosis not present

## 2022-08-22 DIAGNOSIS — S72051A Unspecified fracture of head of right femur, initial encounter for closed fracture: Secondary | ICD-10-CM | POA: Diagnosis not present

## 2022-08-22 DIAGNOSIS — J9601 Acute respiratory failure with hypoxia: Secondary | ICD-10-CM | POA: Diagnosis not present

## 2022-08-22 DIAGNOSIS — I5032 Chronic diastolic (congestive) heart failure: Secondary | ICD-10-CM | POA: Diagnosis not present

## 2022-08-22 DIAGNOSIS — I4819 Other persistent atrial fibrillation: Secondary | ICD-10-CM | POA: Diagnosis not present

## 2022-08-22 DIAGNOSIS — N179 Acute kidney failure, unspecified: Secondary | ICD-10-CM | POA: Diagnosis not present

## 2022-08-22 DIAGNOSIS — J690 Pneumonitis due to inhalation of food and vomit: Secondary | ICD-10-CM | POA: Diagnosis not present

## 2022-08-22 LAB — GLUCOSE, CAPILLARY
Glucose-Capillary: 126 mg/dL — ABNORMAL HIGH (ref 70–99)
Glucose-Capillary: 131 mg/dL — ABNORMAL HIGH (ref 70–99)
Glucose-Capillary: 188 mg/dL — ABNORMAL HIGH (ref 70–99)
Glucose-Capillary: 86 mg/dL (ref 70–99)
Glucose-Capillary: 97 mg/dL (ref 70–99)

## 2022-08-22 LAB — BASIC METABOLIC PANEL
Anion gap: 15 (ref 5–15)
BUN: 85 mg/dL — ABNORMAL HIGH (ref 8–23)
CO2: 20 mmol/L — ABNORMAL LOW (ref 22–32)
Calcium: 7.6 mg/dL — ABNORMAL LOW (ref 8.9–10.3)
Chloride: 107 mmol/L (ref 98–111)
Creatinine, Ser: 3.63 mg/dL — ABNORMAL HIGH (ref 0.44–1.00)
GFR, Estimated: 12 mL/min — ABNORMAL LOW (ref >60)
Glucose, Bld: 193 mg/dL — ABNORMAL HIGH (ref 70–99)
Potassium: 4.3 mmol/L (ref 3.5–5.1)
Sodium: 142 mmol/L (ref 135–145)

## 2022-08-22 LAB — PROTIME-INR
INR: 1.9 — ABNORMAL HIGH (ref 0.8–1.2)
Prothrombin Time: 22 seconds — ABNORMAL HIGH (ref 11.4–15.2)

## 2022-08-22 MED ORDER — VITAMIN K1 10 MG/ML IJ SOLN
10.0000 mg | Freq: Once | INTRAVENOUS | Status: AC
  Administered 2022-08-22: 10 mg via INTRAVENOUS
  Filled 2022-08-22: qty 1

## 2022-08-22 MED ORDER — SODIUM CHLORIDE 0.9 % IV SOLN
3.0000 g | INTRAVENOUS | Status: AC
  Administered 2022-08-23 – 2022-08-24 (×2): 3 g via INTRAVENOUS
  Filled 2022-08-22 (×2): qty 8

## 2022-08-22 MED ORDER — SODIUM CHLORIDE 0.9 % IV SOLN: INTRAVENOUS | Status: AC

## 2022-08-22 NOTE — Progress Notes (Signed)
   Palliative Medicine Inpatient Follow Up Note HPI: 89 y.o. female  with past medical history of HLD, HTN, hypothyroidism, not permanent A-fib, type 2 diabetes, chronic diastolic CHF, MI (123456), vitamin D deficiency, urinary incontinence and pulmonary HTN admitted on 08/06/2022 after being found down s/p unwitnessed fall at home the night before.   Today's Discussion 08/22/2022  *Please note that this is a verbal dictation therefore any spelling or grammatical errors are due to the "[employer]" system interpretation.  Chart reviewed inclusive of vital signs, progress notes, laboratory results, and diagnostic images.  2/28 Hgb/Hct stable.   I checked in with patients RN, Lysbeth Galas this morning. She shares that overall, Tawanya is more alert this morning. Reviewed the plan for possible surgery over the weekend. Discussed optimizing symptoms/pain.   Upon assessment, Berlinda was resting comfortably exhibiting no s/s of distress. Maintains on 6LPM [place].  Called patients son to field any questions from a palliative perspective. Patients son, Juliane Lack was most concerned with the plan for surgery. He shares that he was under the impression that surgery was going to take place yesterday. We were able to over the phone review the ortho progress notes. I informed him that according to that the plan was for re-evaluation tomorrow. I shared that I would reach out to the ortho team to provide further insights.   Questions and concerns addressed/Palliative Support Provided.   Objective Assessment: Vital Signs Vitals:   08/22/22 1036 08/22/22 1051  BP: (!) 101/36   Pulse:  65  Resp: 19 18  Temp:  (!) 97.4 F (36.3 C)  SpO2: 92%     Intake/Output Summary (Last 24 hours) at 08/22/2022 1125 Last data filed at 08/22/2022 0500 Gross per 24 hour  Intake 869.28 ml  Output 0 ml  Net 869.28 ml   Last Weight  Most recent update: 08/22/2022  4:40 AM    Weight  55.4 kg (122 lb 2.2 oz)            Gen:  Frail  elderly caucasian F in NAD HEENT: Dry mucous membranes CV: Regular rate and rhythm, no murmurs rubs or gallops PULM:  On 6LPM [place], breathing is even/nonlabored ABD: soft/nontender  EXT: No edema  Neuro:  Drowsy - had received dilaudid  SUMMARY OF RECOMMENDATIONS   DNAR/DNI  Appreciate Ortho team updating patients son when able --> Per note review surgery is a possibility tomorrow  Continue dilaudid 0.'5mg'$  IV Q4H PRN  Ongoing PMT support  Billing based on MDM: High ______________________________________________________________________________________ [facility] Team Team Cell Phone: [phone] Please utilize secure chat with additional questions, if there is no response within 30 minutes please call the above phone number  Palliative Medicine Team providers are available by phone from 7am to 7pm daily and can be reached through the team cell phone.  Should this patient require assistance outside of these hours, please call the patient's attending physician.

## 2022-08-22 NOTE — Progress Notes (Signed)
PHARMACY NOTE:  ANTIMICROBIAL RENAL DOSAGE ADJUSTMENT  Current antimicrobial regimen includes a mismatch between antimicrobial dosage and estimated renal function.  As per policy approved by the Pharmacy & Therapeutics and Medical Executive Committees, the antimicrobial dosage will be adjusted accordingly.  Current antimicrobial dosage:  Unasyn 3 gm IV q12hrs  Indication: aspiration pneumonia  Renal Function:  Estimated Creatinine Clearance: 8.5 mL/min (A) (by C-G formula based on SCr of 3.63 mg/dL (H)). '[]'$      On intermittent HD, scheduled: '[]'$      On CRRT    Antimicrobial dosage has been changed to:  Unasyn 3 gm IV q24h  Additional comments:  Creatinine trended up, NS x 1 liter today  Will continue to follow renal function for any need to adjust Unasyn interval.  Day #5 antibiotics. Will follow up for length of therapy.  Thank you for allowing pharmacy to be a part of this patient's care.  Arty Baumgartner, [facility] 08/22/2022 11:32 AM

## 2022-08-22 NOTE — Progress Notes (Signed)
Rounding Note    Patient Name: Ana [name] Date of Encounter: 08/22/2022  [place] Cardiologist: None   Subjective   Reports feeling thirsty.  Unsure whether she had a bowel movement yesterday.  Inpatient Medications    Scheduled Meds:  amLODipine  5 mg Oral Daily   insulin aspart  0-15 Units Subcutaneous Q6H   levothyroxine  50 mcg Oral Daily   melatonin  3 mg Oral QHS   metoprolol succinate  12.5 mg Oral Daily   pantoprazole (PROTONIX) IV  40 mg Intravenous Q12H   polyethylene glycol  17 g Oral Daily   Continuous Infusions:  sodium chloride 10 mL/hr at 07/26/2022 1216   ampicillin-sulbactam (UNASYN) IV 3 g (08/22/22 0847)   PRN Meds: sodium chloride, acetaminophen **OR** acetaminophen, guaiFENesin, haloperidol lactate, hydrALAZINE, HYDROmorphone (DILAUDID) injection, ipratropium-albuterol, metoprolol tartrate, prochlorperazine, senna-docusate   Vital Signs    Vitals:   08/22/22 0235 08/22/22 0236 08/22/22 0437 08/22/22 0745  BP:  (!) 104/52 (!) 115/43 (!) 117/42  Pulse: (!) 59 (!) 59 64 67  Resp: 15 (!) '22 20 19  '$ Temp:  97.8 F (36.6 C) 97.8 F (36.6 C) (!) 97.4 F (36.3 C)  TempSrc:  Oral Oral Oral  SpO2: 100% 100% 100% 95%  Weight:   55.4 kg   Height:        Intake/Output Summary (Last 24 hours) at 08/22/2022 1003 Last data filed at 08/22/2022 0500 Gross per 24 hour  Intake 1043.62 ml  Output 0 ml  Net 1043.62 ml      08/22/2022    4:37 AM 07/27/2022    9:07 AM 08/04/2022   12:18 AM  Last 3 Weights  Weight (lbs) 122 lb 2.2 oz 115 lb 11.9 oz 111 lb 8.8 oz  Weight (kg) 55.4 kg 52.5 kg 50.6 kg      Telemetry    Sinus rhythm.  PVCs.  - Personally Reviewed  ECG    N/a - Personally Reviewed  Physical Exam   VS:  BP (!) 117/42 (BP Location: Left Arm)   Pulse 67   Temp (!) 97.4 F (36.3 C) (Oral)   Resp 19   Ht '5\' 2"'$  (1.575 m)   Wt 55.4 kg   SpO2 95%   BMI 22.34 kg/m  , BMI Body mass index is 22.34 kg/m. GENERAL:  Frail.  No  acute distress.  HEENT: Pupils equal round and reactive, fundi not visualized, oral mucosa very dry NECK:  No jugular venous distention, waveform within normal limits, carotid upstroke brisk and symmetric, no bruits, no thyromegaly LUNGS:  Bilateral rhonchi improving HEART:  RRR.  PMI not displaced or sustained,S1 and S2 within normal limits, no S3, no S4, no clicks, no rubs, no murmurs ABD:  Flat, positive bowel sounds normal in frequency in pitch, no bruits, no rebound, no guarding, no midline pulsatile mass, no hepatomegaly, no splenomegaly EXT:  2 plus pulses throughout, no edema, no cyanosis no clubbing SKIN:  No rashes no nodules NEURO:  Cranial nerves II through XII grossly intact, motor grossly intact throughout PSYCH:  Cognitively intact, oriented to person place and time   Labs    High Sensitivity Troponin:   Recent Labs  Lab 07/26/2022 2246 08/18/22 0243  TROPONINIHS 16 25*     Chemistry Recent Labs  Lab 07/28/2022 2246 08/18/22 1347 08/20/22 0053 07/30/2022 0029 08/22/22 0141  NA 137   < > 140 139 142  K 4.2   < > 4.5 4.4 4.3  CL 107   < > 107 108 107  CO2 19*   < > 20* 20* 20*  GLUCOSE 245*   < > 160* 77 193*  BUN 23   < > 50* 64* 85*  CREATININE 1.16*   < > 1.76* 2.67* 3.63*  CALCIUM 8.7*   < > 8.0* 7.6* 7.6*  PROT 6.9  --   --   --   --   ALBUMIN 2.8*  --   --   --   --   AST 39  --   --   --   --   ALT 15  --   --   --   --   ALKPHOS 69  --   --   --   --   BILITOT 0.9  --   --   --   --   GFRNONAA 45*   < > 27* 17* 12*  ANIONGAP 11   < > '13 11 15   '$ < > = values in this interval not displayed.    Lipids No results for input(s): "CHOL", "TRIG", "HDL", "LABVLDL", "LDLCALC", "CHOLHDL" in the last 168 hours.  Hematology Recent Labs  Lab 08/09/2022 1916 08/20/22 0053 08/20/22 1840  WBC 20.9* 22.3* 13.2*  RBC 3.10* 3.22* 3.15*  HGB 9.2* 9.6* 9.5*  HCT 28.7* 30.3* 28.3*  MCV 92.6 94.1 89.8  MCH 29.7 29.8 30.2  MCHC 32.1 31.7 33.6  RDW 13.9 13.9 13.9  PLT  207 205 214   Thyroid No results for input(s): "TSH", "FREET4" in the last 168 hours.  BNP Recent Labs  Lab 08/02/2022 2246 08/18/22 1606 07/25/2022 0029  BNP 688.1* 869.0* 333.6*    DDimer No results for input(s): "DDIMER" in the last 168 hours.   Radiology    No results found.  Cardiac Studies   Echo 08/20/2022:  1. Left ventricular ejection fraction, by estimation, is 55 to 60%. Left  ventricular ejection fraction by 3D volume is 56 %. The left ventricle has  normal function. The left ventricle has no regional wall motion  abnormalities. Left ventricular diastolic   function could not be evaluated.   2. Right ventricular systolic function is mildly reduced. The right  ventricular size is normal. Tricuspid regurgitation signal is inadequate  for assessing PA pressure.   3. Moderate pleural effusion in the left lateral region.   4. The mitral valve is rheumatic. Mild to moderate mitral valve  regurgitation. Moderate mitral annular calcification.   5. There is mild calcification of the aortic valve. There is moderate  thickening of the aortic valve. Aortic valve regurgitation is mild to  moderate.   Patient Profile     Ms. Ana [name] is an 71F with HFpEF, hypertension, hyperlipidemia, diabetes, atrial fibrillation/flutter, and chronic anemia admitted after being found down and noted to have a R hip fracture and upper GI bleed. Cardiology consulted for elevated troponin and pre-op evaluation.   Assessment & Plan    # Acute hypoxic respiratory failure:  # HFpEF: # AKI: Aspiration PNA in the setting of hematemesis and being found down.  BNP also mildly elevated and small pleural effusions.    However renal function is worsening and she is increasingly pre-renal.  She is very thirsty and dry.  Will give another 1L IVF over 10 hours.  Consider maintenance fluids after that, as she is struggling to feed herself.  Recommend feeding assistance. Respiratory status is significantly improved.   Antibiotics per primary team and critical care.   #  Pre-op evaluation:  # R hip fracture:  No further cardiac evaluation needed prior to surgery.  Respiratory status significantly improved.  Now down to 2L O2.  Planning for surgery on Saturday.  Now with  AKI and colonic distension.  Miralax ordered x2 days.  Hopefully she can have a bowel movement prior to surgery.   # PAF/flutter; Remains in sinus rhythm. INR was supra therapeutic.  Warfarin on hold and she received vitamin K.  Metoprolol reduced due to bradycardia.   # HTN:  Continue amlodipine.  Home losartan on hold.       For questions or updates, please contact [place] Please consult [email or web address] for contact info under        Signed, Skeet Latch, MD  08/22/2022, 10:03 AM

## 2022-08-22 NOTE — Progress Notes (Signed)
[facility] PROGRESS NOTE    Progress Note  Ana [name]  [id number] DOB: 01/31/1932 DOA: 08/15/2022 PCP: London Pepper, MD     Brief Narrative:   Ana [name] is an 89 y.o. female past medical history of diabetes mellitus, essential hypertension hypothyroidism, permanent atrial fibrillation on Coumadin and pulmonary hypertension who family members found her on the floor brought into the ED while in the ED developed coffee-ground emesis, with a supratherapeutic INR of 3, FOBT positive hemoglobin of 11, was complaining of right hip pain x-ray showed acute fracture of the right proximal femur, x-ray of the knee showed degenerative changes, CT of the head showed no acute findings chronic ischemic microangiopathy, C-spine showed no fracture or dislocation.  Chest x-ray with findings suggestive of aspiration pneumonia.  Hospital course complicated by dysphagia.  Evaluated by speech therapist recommendation for dysphagia 2 diet with honey thick liquid.  08/22/2022: The patient was seen and examined at her bedside.  She is more alert and interactive.  Worsening renal function.  Started on IV fluid NS at 100 cc/h x 10 hours, per cardiology.  Assessment/Plan:   Acute respiratory failure with hypoxia, suspected aspiration pneumonia pneumonitis. Initially on 6 L high flow nasal cannula to maintain a saturation greater than 92%.  Continue to wean off O2 supplementation as tolerated.  Femur fracture, right (HCC) Pain control in place. Cardiology was consulted who recommended to proceeding with surgical intervention when his hematemesis has been resolved.  2D echo showed an EF of 50% no regional wall motion abnormalities.  Mitral valve is rheumatic mild to moderate mitral regurgitation and annular calcification, and mild aortic regurg. Orthopedic surgery is following INR 1.9 this morning, 1 dose of vitamin K given Repeat INR in the morning  Worsening AKI on CKD 3 Baseline creatinine 1.2  with GFR 43. Creatinine uptrending 3.63. IV fluid hydration NS at 100 cc/h x 10 hours. Home losartan on hold. Repeat renal panel in the morning.  Coffee-ground emesis/upper GI bleed/symptomatic anemia: INR 1.9. No reported recurrent upper GI bleed. Continue to monitor H&H  Persistent atrial fibrillation ([place]): Coumadin held, given FFP and vitamin K. Continue metoprolol for heart rate greater than 100. INR 1.9 this morning.  Chronic diastolic CHF (congestive heart failure) ([place]) She appears euvolemic on physical exam no JVD, negative hepatojugular reflux no lower extremity edema. I's and O's poorly recorded. She appears euvolemic this morning on physical exam.  Essential hypertension: Bp with stable.  She is on Norvasc and Toprol-XL 12.5 mg daily. Avoid hypotension. Closely monitor vital signs  Goals of care DNR Palliative care team following.  DVT prophylaxis: SCD's Family Communication:Son Status is: Inpatient Remains inpatient appropriate because: acute gi bleed    Code Status:     Code Status Orders  (From admission, onward)           Start     Ordered   08/18/22 0630  Do not attempt resuscitation (DNR)  Continuous       Question Answer Comment  If patient has no pulse and is not breathing Do Not Attempt Resuscitation   If patient has a pulse and/or is breathing: Medical Treatment Goals COMFORT MEASURES: Keep clean/warm/dry, use medication by any route; positioning, wound care and other measures to relieve pain/suffering; use oxygen, suction/manual treatment of airway obstruction for comfort; do not transfer unless for comfort needs.   Consent: Discussion documented in EHR or advanced directives reviewed      08/18/22 0630  Code Status History     Date Active Date Inactive Code Status Order ID Comments User Context   08/18/2022 0629 08/18/2022 0630 DNR [id number]  Quintella Baton, MD ED   01/19/2014 1013 01/20/2014 2050 Full Code [id number]   Leonie Man, MD Inpatient   01/17/2014 1810 01/19/2014 1013 Full Code [id number]  Troy Sine, MD Inpatient   01/14/2014 1539 01/17/2014 1810 Full Code [id number]  Darlin Coco, MD Inpatient         IV Access:   Peripheral IV   Procedures and diagnostic studies:   No results found.   Medical Consultants:   None.     Objective:    Vitals:   08/22/22 0745 08/22/22 1036 08/22/22 1051 08/22/22 1643  BP: (!) 117/42 (!) 101/36  (!) 107/48  Pulse: 67  65 66  Resp: '19 19 18 16  '$ Temp: (!) 97.4 F (36.3 C)  (!) 97.4 F (36.3 C) 98.9 F (37.2 C)  TempSrc: Oral  Oral Axillary  SpO2: 95% 92%  95%  Weight:      Height:       SpO2: 95 % O2 Flow Rate (L/min): 6 L/min FiO2 (%): (S) 40 % (spo2 100%)   Intake/Output Summary (Last 24 hours) at 08/22/2022 1739 Last data filed at 08/22/2022 1302 Gross per 24 hour  Intake 180 ml  Output 0 ml  Net 180 ml   Filed Weights   08/13/2022 0018 08/16/2022 0907 08/22/22 0437  Weight: 50.6 kg 52.5 kg 55.4 kg    Exam: General exam: Frail appearing in no acute distress.  She is alert and oriented x 3. Respiratory system: Mild rales at bases.  No wheezing noted.  Poor inspiratory effort. Cardiovascular system: Regular rate and rhythm no rubs or gallops.. Gastrointestinal system: Soft nondistended bowel sounds present.  Extremities: No pedal edema bilaterally.   Skin: No rashes, lesions or ulcers Psychiatry: Mood is appropriate for condition and setting. Data Reviewed:    Labs: Basic Metabolic Panel: Recent Labs  Lab 08/13/2022 0028 08/18/2022 0704 08/20/22 0053 08/12/2022 0029 08/22/22 0141  NA 139 139 140 139 142  K 4.6 4.3 4.5 4.4 4.3  CL 107 106 107 108 107  CO2 23 21* 20* 20* 20*  GLUCOSE 132* 185* 160* 77 193*  BUN 31* 36* 50* 64* 85*  CREATININE 1.22* 1.36* 1.76* 2.67* 3.63*  CALCIUM 8.4* 8.3* 8.0* 7.6* 7.6*   GFR Estimated Creatinine Clearance: 8.5 mL/min (A) (by C-G formula based on SCr of 3.63 mg/dL  (H)). Liver Function Tests: Recent Labs  Lab 07/31/2022 2246  AST 39  ALT 15  ALKPHOS 69  BILITOT 0.9  PROT 6.9  ALBUMIN 2.8*   No results for input(s): "LIPASE", "AMYLASE" in the last 168 hours. No results for input(s): "AMMONIA" in the last 168 hours. Coagulation profile Recent Labs  Lab 08/11/2022 2246 08/04/2022 0028 08/20/22 0053 07/26/2022 0029 08/22/22 0141  INR 3.0* 1.6* 1.4* 1.8* 1.9*   COVID-19 Labs  No results for input(s): "DDIMER", "FERRITIN", "LDH", "CRP" in the last 72 hours.  No results found for: "SARSCOV2NAA"  CBC: Recent Labs  Lab 08/18/22 1859 08/18/22 2128 07/25/2022 0704 08/07/2022 1916 08/20/22 0053 08/20/22 1840  WBC 20.8*  --  21.3* 20.9* 22.3* 13.2*  HGB 9.6* 9.7* 10.1* 9.2* 9.6* 9.5*  HCT 29.8* 28.7* 30.1* 28.7* 30.3* 28.3*  MCV 91.7  --  90.4 92.6 94.1 89.8  PLT 207  --  207 207 205 214   Cardiac Enzymes: Recent Labs  Lab  07/26/2022 2246 07/25/2022 0028  CKTOTAL 379* 670*   BNP (last 3 results) Recent Labs    02/19/22 1456  PROBNP 1,947*   CBG: Recent Labs  Lab 08/10/2022 1642 08/22/22 0020 08/22/22 0622 08/22/22 1044 08/22/22 1641  GLUCAP 164* 188* 86 131* 97   D-Dimer: No results for input(s): "DDIMER" in the last 72 hours. Hgb A1c: No results for input(s): "HGBA1C" in the last 72 hours. Lipid Profile: No results for input(s): "CHOL", "HDL", "LDLCALC", "TRIG", "CHOLHDL", "LDLDIRECT" in the last 72 hours. Thyroid function studies: No results for input(s): "TSH", "T4TOTAL", "T3FREE", "THYROIDAB" in the last 72 hours.  Invalid input(s): "FREET3" Anemia work up: No results for input(s): "VITAMINB12", "FOLATE", "FERRITIN", "TIBC", "IRON", "RETICCTPCT" in the last 72 hours. Sepsis Labs: Recent Labs  Lab 08/13/2022 2246 08/18/22 0243 08/18/22 0648 08/18/22 1347 08/18/22 1859 08/07/2022 0704 08/15/2022 1916 08/20/22 0053 08/20/22 1840  PROCALCITON  --   --   --  0.15  --   --   --   --   --   WBC 10.6*  --   --   --    < >  21.3* 20.9* 22.3* 13.2*  LATICACIDVEN 4.2* 3.8* 2.1* 1.6  --   --   --   --   --    < > = values in this interval not displayed.   Microbiology Recent Results (from the past 240 hour(s))  Surgical PCR screen     Status: None   Collection Time: 08/18/22  3:44 PM   Specimen: Nasal Mucosa; Nasal Swab  Result Value Ref Range Status   MRSA, PCR NEGATIVE NEGATIVE Final   Staphylococcus aureus NEGATIVE NEGATIVE Final    Comment: (NOTE) The Xpert SA Assay (FDA approved for NASAL specimens in patients 68 years of age and older), is one component of a comprehensive surveillance program. It is not intended to diagnose infection nor to guide or monitor treatment. Performed at [facility], [place] [address] [place], [place] [address]      Medications:    amLODipine  5 mg Oral Daily   insulin aspart  0-15 Units Subcutaneous Q6H   levothyroxine  50 mcg Oral Daily   melatonin  3 mg Oral QHS   metoprolol succinate  12.5 mg Oral Daily   pantoprazole (PROTONIX) IV  40 mg Intravenous Q12H   Continuous Infusions:  sodium chloride 10 mL/hr at 07/31/2022 1216   sodium chloride 100 mL/hr at 08/22/22 1043   [START ON 08/23/2022] ampicillin-sulbactam (UNASYN) IV     phytonadione (VITAMIN K) 10 mg in dextrose 5 % 50 mL IVPB 10 mg (08/22/22 1725)      LOS: 4 days   Kayleen Memos  [facility]  08/22/2022, 5:39 PM

## 2022-08-22 NOTE — Progress Notes (Signed)
NAME:  Ana [name], MRN:  [id number], DOB:  1932/03/11, LOS: 4 ADMISSION DATE:  08/20/2022, CONSULTATION DATE: 08/20/2022 REFERRING MD: [facility], CHIEF COMPLAINT: Hypoxia  History of Present Illness:  89 year old female who lives alone and was independent until she was found down for approximately 24 hours noted to have vomited blood aspirated and had a fractured right hip.  She was admitted to the hospital was requiring high flow nasal cannula and noninvasive mechanical ventilatory support to maintain adequate oxygenation.  She has been on broad-spectrum antibiotics and has been diuresed and is slowly improving with decreasing FiO2 needs.  Pulmonary critical care has been asked to evaluate for possible surgical intervention for repair of right hip.  We wait at least 2 more days to give her time to equilibrate and response to the current treatment which appears to be working.  Having said that she is due to her age and multiple comorbidities a surgical risk.  Pertinent  Medical History   Past Medical History:  Diagnosis Date   Anemia of chronic disease    Aortic insufficiency    moderate by echo 03/2018   Chronic diastolic CHF (congestive heart failure) ([place]) 2014   Diabetes mellitus without complication ([place])    Dizziness 02/13/2015   Hyperlipidemia    Hypertension    Hypothyroidism    MI, old 07/18/2013   Mitral stenosis    mild by echo 03/2018   Permanent atrial fibrillation (HCC)    Pulmonary HTN (HCC)    PASP 67mHg 03/2018   Urinary incontinence    Vitamin D deficiency disease      Significant Hospital Events: Including procedures, antibiotic start and stop dates in addition to other pertinent events     Interim History / Subjective:  Continue to monitor for questionable surgery in future  Objective   Blood pressure (!) 117/42, pulse 67, temperature (!) 97.4 F (36.3 C), temperature source Oral, resp. rate 19, height '5\' 2"'$  (1.575 m), weight 55.4 kg, SpO2 95 %.         Intake/Output Summary (Last 24 hours) at 08/22/2022 0835 Last data filed at 08/22/2022 0500 Gross per 24 hour  Intake 1283.62 ml  Output 0 ml  Net 1283.62 ml   Filed Weights   08/07/2022 0018 07/26/2022 0907 08/22/22 0437  Weight: 50.6 kg 52.5 kg 55.4 kg    Examination: General: Frail cachectic female HEENT: MM pink/moist dry oral mucosa Neuro: Responds to verbal stimuli follows simple commands CV: Heart sounds are distant PULM: Diminished breath sounds in the bases faint crackles, FiO2 decreased to 2 L nasal cannula due to sats of 98% on 4 L  GI: soft, bsx4 active  Extremities: warm/dry, 1+ edema right leg deviated to right Skin: no rashes or lesions   Resolved Hospital Problem list     Assessment & Plan:  Acute hypoxic respiratory failure in the setting of having been found down for approximate 24 hours with multiple episodes of aspiration in the setting of vomiting blood.  Noted her O2 saturations are improving and her O2 demands are decreasing and is showing improvement with current interventions.  Impression/Plan: Acute hypoxic respiratory failure in the setting of aspiration after being found down for 24 hours. We have currently weaned her FiO2 down to 2 L with O2 sats of 94% She needs surgical repair of her right hip due to her advanced age of 89and comorbidities she would always be a surgical risk Questionable plan for surgical intervention Saturday, 08/23/2022  Right hip fracture Orthopedics is following for repair  Heart failure Chronic atrial fibrillation Hypertension Cardiology is following Undergoing diuresis  GI bleed in the setting of supratherapeutic INR Lab Results  Component Value Date   INR 1.9 (H) 08/22/2022   INR 1.8 (H) 07/29/2022   INR 1.4 (H) 08/20/2022    GI consult Proton pump inhibitor Hold anticoagulation   Hypothyroidism Synthroid  Best Practice (right click and "Reselect all SmartList Selections" daily)   Diet/type:  NPO DVT prophylaxis: not indicated GI prophylaxis: PPI Lines: N/A Foley:  N/A Code Status:  DNR Last date of multidisciplinary goals of care discussion [tbd] Currently full DNR per primary Labs   CBC: Recent Labs  Lab 08/18/22 1859 08/18/22 2128 07/27/2022 0704 08/05/2022 1916 08/20/22 0053 08/20/22 1840  WBC 20.8*  --  21.3* 20.9* 22.3* 13.2*  HGB 9.6* 9.7* 10.1* 9.2* 9.6* 9.5*  HCT 29.8* 28.7* 30.1* 28.7* 30.3* 28.3*  MCV 91.7  --  90.4 92.6 94.1 89.8  PLT 207  --  207 207 205 [id number]    Basic Metabolic Panel: Recent Labs  Lab 08/05/2022 0028 07/31/2022 0704 08/20/22 0053 08/01/2022 0029 08/22/22 0141  NA 139 139 140 139 142  K 4.6 4.3 4.5 4.4 4.3  CL 107 106 107 108 107  CO2 23 21* 20* 20* 20*  GLUCOSE 132* 185* 160* 77 193*  BUN 31* 36* 50* 64* 85*  CREATININE 1.22* 1.36* 1.76* 2.67* 3.63*  CALCIUM 8.4* 8.3* 8.0* 7.6* 7.6*   GFR: Estimated Creatinine Clearance: 8.5 mL/min (A) (by C-G formula based on SCr of 3.63 mg/dL (H)). Recent Labs  Lab 08/06/2022 2246 08/18/22 0243 08/18/22 0648 08/18/22 1347 08/18/22 1859 08/09/2022 0704 08/09/2022 1916 08/20/22 0053 08/20/22 1840  PROCALCITON  --   --   --  0.15  --   --   --   --   --   WBC 10.6*  --   --   --    < > 21.3* 20.9* 22.3* 13.2*  LATICACIDVEN 4.2* 3.8* 2.1* 1.6  --   --   --   --   --    < > = values in this interval not displayed.    Liver Function Tests: Recent Labs  Lab 07/30/2022 2246  AST 39  ALT 15  ALKPHOS 69  BILITOT 0.9  PROT 6.9  ALBUMIN 2.8*   No results for input(s): "LIPASE", "AMYLASE" in the last 168 hours. No results for input(s): "AMMONIA" in the last 168 hours.  ABG    Component Value Date/Time   PHART 7.37 07/30/2022 0400   PCO2ART 36 08/07/2022 0400   PO2ART 68 (L) 08/16/2022 0400   HCO3 20.9 08/10/2022 0400   TCO2 18 (L) 07/31/2022 2245   ACIDBASEDEF 4.0 (H) 08/08/2022 0400   O2SAT 96.3 08/07/2022 0400     Coagulation Profile: Recent Labs  Lab 08/01/2022 2246 08/14/2022 0028  08/20/22 0053 08/18/2022 0029 08/22/22 0141  INR 3.0* 1.6* 1.4* 1.8* 1.9*    Cardiac Enzymes: Recent Labs  Lab 08/14/2022 2246 07/29/2022 0028  CKTOTAL 379* 670*    HbA1C: Hgb A1c MFr Bld  Date/Time Value Ref Range Status  08/18/2022 07:20 AM 6.1 (H) 4.8 - 5.6 % Final    Comment:    (NOTE)         Prediabetes: 5.7 - 6.4         Diabetes: >6.4         Glycemic control for adults with diabetes: <7.0   01/14/2014 04:45  PM 7.5 (H) <5.7 % Final    Comment:    (NOTE)                                                                       According to the ADA Clinical Practice Recommendations for 2011, when HbA1c is used as a screening test:  >=6.5%   Diagnostic of Diabetes Mellitus           (if abnormal result is confirmed) 5.7-6.4%   Increased risk of developing Diabetes Mellitus References:Diagnosis and Classification of Diabetes Mellitus,Diabetes [id number] 1):S62-S69 and Standards of Medical Care in         Diabetes - 2011,Diabetes [id number] (Suppl 1):S11-S61.    CBG: Recent Labs  Lab 08/03/2022 0545 07/26/2022 1225 08/02/2022 1642 08/22/22 0020 08/22/22 0622  GLUCAP 88 174* 164* 188* 86      Critical care time: Ferol Luz Cael Worth ACNP Acute Care Nurse Practitioner [place] Please consult Amion 08/22/2022, 8:35 AM

## 2022-08-22 NOTE — Progress Notes (Signed)
Pt on 3L [place] and stable. Mild distress noted. Pt refusing Bipap at this time. Will continue to monitor

## 2022-08-22 NOTE — Progress Notes (Signed)
Discussed case last night with primary care team They suggested waiting until Saturday for possible surgery to allow for improved pulmonary function INR also increased yesterday which makes spinal anesthetic not an available choice for this case.

## 2022-08-22 NOTE — Progress Notes (Addendum)
MD, patient's son is concerned about her planned surgery for tomorrow, he  wants to talk with MD before surgery.  [name] is her son and he wants MD to give him a call in the morning, [phone].

## 2022-08-22 DEATH — deceased

## 2022-08-23 ENCOUNTER — Inpatient Hospital Stay (HOSPITAL_COMMUNITY): Payer: Medicare HMO

## 2022-08-23 DIAGNOSIS — S72051A Unspecified fracture of head of right femur, initial encounter for closed fracture: Secondary | ICD-10-CM | POA: Diagnosis not present

## 2022-08-23 DIAGNOSIS — J690 Pneumonitis due to inhalation of food and vomit: Secondary | ICD-10-CM | POA: Diagnosis not present

## 2022-08-23 LAB — GLUCOSE, CAPILLARY
Glucose-Capillary: 124 mg/dL — ABNORMAL HIGH (ref 70–99)
Glucose-Capillary: 125 mg/dL — ABNORMAL HIGH (ref 70–99)
Glucose-Capillary: 134 mg/dL — ABNORMAL HIGH (ref 70–99)
Glucose-Capillary: 201 mg/dL — ABNORMAL HIGH (ref 70–99)

## 2022-08-23 LAB — URINALYSIS, ROUTINE W REFLEX MICROSCOPIC
Bilirubin Urine: NEGATIVE
Glucose, UA: NEGATIVE mg/dL
Ketones, ur: 5 mg/dL — AB
Nitrite: NEGATIVE
Protein, ur: 100 mg/dL — AB
RBC / HPF: >50 RBC/hpf (ref 0–5)
Specific Gravity, Urine: 1.024 (ref 1.005–1.030)
WBC, UA: >50 WBC/hpf (ref 0–5)
pH: 5 (ref 5.0–8.0)

## 2022-08-23 LAB — RENAL FUNCTION PANEL
Albumin: 2 g/dL — ABNORMAL LOW (ref 3.5–5.0)
Anion gap: 12 (ref 5–15)
BUN: 96 mg/dL — ABNORMAL HIGH (ref 8–23)
CO2: 19 mmol/L — ABNORMAL LOW (ref 22–32)
Calcium: 7.5 mg/dL — ABNORMAL LOW (ref 8.9–10.3)
Chloride: 110 mmol/L (ref 98–111)
Creatinine, Ser: 4.3 mg/dL — ABNORMAL HIGH (ref 0.44–1.00)
GFR, Estimated: 9 mL/min — ABNORMAL LOW (ref >60)
Glucose, Bld: 126 mg/dL — ABNORMAL HIGH (ref 70–99)
Phosphorus: 5 mg/dL — ABNORMAL HIGH (ref 2.5–4.6)
Potassium: 4.2 mmol/L (ref 3.5–5.1)
Sodium: 141 mmol/L (ref 135–145)

## 2022-08-23 LAB — CK: Total CK: 91 U/L (ref 38–234)

## 2022-08-23 LAB — CBC
HCT: 27.3 % — ABNORMAL LOW (ref 36.0–46.0)
Hemoglobin: 9 g/dL — ABNORMAL LOW (ref 12.0–15.0)
MCH: 29.7 pg (ref 26.0–34.0)
MCHC: 33 g/dL (ref 30.0–36.0)
MCV: 90.1 fL (ref 80.0–100.0)
Platelets: 169 10*3/uL (ref 150–400)
RBC: 3.03 MIL/uL — ABNORMAL LOW (ref 3.87–5.11)
RDW: 14.5 % (ref 11.5–15.5)
WBC: 12.8 10*3/uL — ABNORMAL HIGH (ref 4.0–10.5)
nRBC: 0 % (ref 0.0–0.2)

## 2022-08-23 LAB — PROTEIN / CREATININE RATIO, URINE
Creatinine, Urine: 107 mg/dL
Protein Creatinine Ratio: 1.04 mg/mg{Cre} — ABNORMAL HIGH (ref 0.00–0.15)
Total Protein, Urine: 111 mg/dL

## 2022-08-23 LAB — PROTIME-INR
INR: 1.8 — ABNORMAL HIGH (ref 0.8–1.2)
Prothrombin Time: 21.1 seconds — ABNORMAL HIGH (ref 11.4–15.2)

## 2022-08-23 LAB — SODIUM, URINE, RANDOM: Sodium, Ur: 31 mmol/L

## 2022-08-23 LAB — CREATININE, URINE, RANDOM: Creatinine, Urine: 109 mg/dL

## 2022-08-23 MED ORDER — LACTATED RINGERS IV BOLUS
500.0000 mL | Freq: Once | INTRAVENOUS | Status: AC
  Administered 2022-08-23: 500 mL via INTRAVENOUS

## 2022-08-23 MED ORDER — ALBUMIN HUMAN 25 % IV SOLN
25.0000 g | Freq: Four times a day (QID) | INTRAVENOUS | Status: AC
  Administered 2022-08-23 – 2022-08-24 (×3): 25 g via INTRAVENOUS
  Filled 2022-08-23 (×3): qty 100

## 2022-08-23 MED ORDER — SODIUM CHLORIDE 0.45 % IV SOLN
INTRAVENOUS | Status: DC
  Filled 2022-08-23 (×3): qty 75

## 2022-08-23 MED ORDER — LACTATED RINGERS IV SOLN: INTRAVENOUS | Status: DC

## 2022-08-23 NOTE — Progress Notes (Addendum)
PROGRESS NOTE    Ana [name]  [id number] DOB: [birth year] DOA: 08/11/2022 PCP: London Pepper, MD   Brief Narrative:    Ana [name] is an 89 y.o. female past medical history of diabetes mellitus, essential hypertension hypothyroidism, permanent atrial fibrillation on Coumadin and pulmonary hypertension who family members found her on the floor brought into the ED while in the ED developed coffee-ground emesis, with a supratherapeutic INR of 3, FOBT positive hemoglobin of 11, was complaining of right hip pain x-ray showed acute fracture of the right proximal femur, x-ray of the knee showed degenerative changes, CT of the head showed no acute findings chronic ischemic microangiopathy, C-spine showed no fracture or dislocation.  Chest x-ray with findings suggestive of aspiration pneumonia.   She now has worsening AKI-suspected to be prerenal and has been started on IV fluid.  She is also noted to have ongoing elevation in INR level and orthopedics postponing hip repair.  Assessment & Plan:   Principal Problem:   Femur fracture, right (HCC) Active Problems:   Persistent atrial fibrillation (HCC)   Chronic diastolic CHF (congestive heart failure) (HCC)   Essential hypertension   Coronary atherosclerosis of native coronary artery   HLD (hyperlipidemia)   GI bleed   Supratherapeutic INR   Lactic acidosis   Fall at home, initial encounter   Coffee ground emesis   Closed fracture of right hip (HCC)   PAF (paroxysmal atrial fibrillation) (HCC)   Acute respiratory failure with hypoxia (HCC)   Aspiration pneumonia of right upper lobe due to gastric secretions (HCC)   AKI (acute kidney injury) ([place])  Assessment and Plan:   Acute respiratory failure with hypoxia, suspected aspiration pneumonia pneumonitis. Initially on 6 L high flow nasal cannula to maintain a saturation greater than 92%.  Continue to wean off O2 supplementation as tolerated.   Femur fracture, right (HCC) Pain control  in place. Cardiology was consulted who recommended to proceeding with surgical intervention when his hematemesis has been resolved.  2D echo showed an EF of 50% no regional wall motion abnormalities.  Mitral valve is rheumatic mild to moderate mitral regurgitation and annular calcification, and mild aortic regurg. Orthopedic surgery is following with tentative plans for surgery 3/3 INR 1.9 this morning, 1 dose of vitamin K given Repeat INR in the morning   Worsening AKI on CKD 3-now oliguric Baseline creatinine 1.2 with GFR 43. Creatinine uptrending  Appreciate nephrology consultation 3/2 Start on IV fluids with LR Home losartan on hold. Repeat renal panel in the morning. Check urine electrolytes and renal ultrasound CK level per PCCM   Coffee-ground emesis/upper GI bleed/symptomatic anemia: INR 1.9. No reported recurrent upper GI bleed. Continue to monitor H&H   Persistent atrial fibrillation ([place]): Coumadin held, given FFP and vitamin K given IV 3/1 Continue metoprolol for heart rate greater than 100. INR 1.8 this morning.   Chronic diastolic CHF (congestive heart failure) ([place]) She appears euvolemic on physical exam no JVD, negative hepatojugular reflux no lower extremity edema. I's and O's poorly recorded. She appears euvolemic this morning on physical exam.   Essential hypertension: Bp with stable.  She is on Norvasc and Toprol-XL 12.5 mg daily. Avoid hypotension. Closely monitor vital signs   Goals of care DNR Palliative care team following.   DVT prophylaxis: SCDs Code Status: DNR Family Communication: Son, [name] at bedside 3/2 Disposition Plan: Await further correction of INR prior to pursuing surgery Status is: Inpatient Remains inpatient appropriate because: Need for IV fluids and medications.  Consultants:  Orthopedics Nephrology Palliative care PCCM  Procedures:  None  Antimicrobials:  Anti-infectives (From admission, onward)    Start      Dose/Rate Route Frequency Ordered Stop   08/23/22 1000  Ampicillin-Sulbactam (UNASYN) 3 g in sodium chloride 0.9 % 100 mL IVPB        3 g 200 mL/hr over 30 Minutes Intravenous Every 24 hours 08/22/22 1132 2022-08-28 2359   07/24/2022 1300  ceFAZolin (ANCEF) IVPB 2g/100 mL premix        2 g 200 mL/hr over 30 Minutes Intravenous On call to O.R. 07/26/2022 0907 08/22/22 0559   08/20/22 1800  meropenem (MERREM) 500 mg in sodium chloride 0.9 % 100 mL IVPB  Status:  Discontinued        500 mg 200 mL/hr over 30 Minutes Intravenous Every 12 hours 08/20/22 0903 08/20/22 1451   08/20/22 1800  Ampicillin-Sulbactam (UNASYN) 3 g in sodium chloride 0.9 % 100 mL IVPB  Status:  Discontinued        3 g 200 mL/hr over 30 Minutes Intravenous Every 12 hours 08/20/22 1451 08/22/22 1132   08/14/2022 0400  linezolid (ZYVOX) IVPB 600 mg  Status:  Discontinued        600 mg 300 mL/hr over 60 Minutes Intravenous Every 12 hours 08/06/2022 0202 08/20/22 1451   07/29/2022 0315  meropenem (MERREM) 1 g in sodium chloride 0.9 % 100 mL IVPB  Status:  Discontinued        1 g 200 mL/hr over 30 Minutes Intravenous Every 12 hours 08/12/2022 0226 08/20/22 0903   08/18/22 0800  Ampicillin-Sulbactam (UNASYN) 3 g in sodium chloride 0.9 % 100 mL IVPB  Status:  Discontinued       Note to Pharmacy: Unasyn 3 g IV q12h for CrCl < 30 mL/min   3 g 200 mL/hr over 30 Minutes Intravenous Every 12 hours 08/18/22 0633 08/18/22 0654   08/18/22 0800  ciprofloxacin (CIPRO) IVPB 200 mg  Status:  Discontinued        200 mg 100 mL/hr over 60 Minutes Intravenous 2 times daily 08/18/22 0654 08/18/22 0721   08/18/22 0800  metroNIDAZOLE (FLAGYL) IVPB 500 mg  Status:  Discontinued        500 mg 100 mL/hr over 60 Minutes Intravenous 2 times daily 08/18/22 0654 08/18/22 0727   08/18/22 0730  Ampicillin-Sulbactam (UNASYN) 3 g in sodium chloride 0.9 % 100 mL IVPB  Status:  Discontinued        3 g 200 mL/hr over 30 Minutes Intravenous Every 12 hours 08/18/22 0721  08/16/2022 0226      Subjective: Patient seen and evaluated today with no new acute complaints or concerns. No acute concerns or events noted overnight.  Son at bedside.  Patient states that she is having some ongoing bilateral lower extremity pain.  Objective: Vitals:   08/22/22 2315 08/23/22 0443 08/23/22 0822 08/23/22 0954  BP: (!) 110/48 127/67 136/61 (!) 111/53  Pulse: 65 86 87 89  Resp: '15 18 18   '$ Temp: 98.1 F (36.7 C) 98.4 F (36.9 C) 98.3 F (36.8 C)   TempSrc: Axillary Oral Oral   SpO2: 99% 95% 96%   Weight:  55.7 kg    Height:        Intake/Output Summary (Last 24 hours) at 08/23/2022 1025 Last data filed at 08/23/2022 0148 Gross per 24 hour  Intake 788.99 ml  Output 75 ml  Net 713.99 ml   Filed Weights   08/07/2022 0907 08/22/22  [id number] 08/23/22 0443  Weight: 52.5 kg 55.4 kg 55.7 kg    Examination:  General exam: Appears calm and comfortable, hard of hearing Respiratory system: Clear to auscultation. Respiratory effort normal.  3 L nasal cannula Cardiovascular system: S1 & S2 heard, RRR.  Gastrointestinal system: Abdomen is soft Central nervous system: Alert and awake Extremities: No edema Skin: No significant lesions noted Psychiatry: Flat affect.    Data Reviewed: I have personally reviewed following labs and imaging studies  CBC: Recent Labs  Lab 08/18/2022 0704 08/04/2022 1916 08/20/22 0053 08/20/22 1840 08/23/22 0107  WBC 21.3* 20.9* 22.3* 13.2* 12.8*  HGB 10.1* 9.2* 9.6* 9.5* 9.0*  HCT 30.1* 28.7* 30.3* 28.3* 27.3*  MCV 90.4 92.6 94.1 89.8 90.1  PLT 207 207 205 214 [id number]   Basic Metabolic Panel: Recent Labs  Lab 08/18/2022 0704 08/20/22 0053 07/30/2022 0029 08/22/22 0141 08/23/22 0107  NA 139 140 139 142 141  K 4.3 4.5 4.4 4.3 4.2  CL 106 107 108 107 110  CO2 21* 20* 20* 20* 19*  GLUCOSE 185* 160* 77 193* 126*  BUN 36* 50* 64* 85* 96*  CREATININE 1.36* 1.76* 2.67* 3.63* 4.30*  CALCIUM 8.3* 8.0* 7.6* 7.6* 7.5*  PHOS  --   --   --   --  5.0*    GFR: Estimated Creatinine Clearance: 7.2 mL/min (A) (by C-G formula based on SCr of 4.3 mg/dL (H)). Liver Function Tests: Recent Labs  Lab 07/29/2022 2246 08/23/22 0107  AST 39  --   ALT 15  --   ALKPHOS 69  --   BILITOT 0.9  --   PROT 6.9  --   ALBUMIN 2.8* 2.0*   No results for input(s): "LIPASE", "AMYLASE" in the last 168 hours. No results for input(s): "AMMONIA" in the last 168 hours. Coagulation Profile: Recent Labs  Lab 07/28/2022 0028 08/20/22 0053 07/26/2022 0029 08/22/22 0141 08/23/22 0107  INR 1.6* 1.4* 1.8* 1.9* 1.8*   Cardiac Enzymes: Recent Labs  Lab 07/30/2022 2246 08/18/2022 0028  CKTOTAL 379* 670*   BNP (last 3 results) Recent Labs    02/19/22 1456  PROBNP 1,947*   HbA1C: No results for input(s): "HGBA1C" in the last 72 hours. CBG: Recent Labs  Lab 08/22/22 1044 08/22/22 1641 08/22/22 2112 08/23/22 0017 08/23/22 0611  GLUCAP 131* 97 126* 125* 124*   Lipid Profile: No results for input(s): "CHOL", "HDL", "LDLCALC", "TRIG", "CHOLHDL", "LDLDIRECT" in the last 72 hours. Thyroid Function Tests: No results for input(s): "TSH", "T4TOTAL", "FREET4", "T3FREE", "THYROIDAB" in the last 72 hours. Anemia Panel: No results for input(s): "VITAMINB12", "FOLATE", "FERRITIN", "TIBC", "IRON", "RETICCTPCT" in the last 72 hours. Sepsis Labs: Recent Labs  Lab 07/30/2022 2246 08/18/22 0243 08/18/22 0648 08/18/22 1347  PROCALCITON  --   --   --  0.15  LATICACIDVEN 4.2* 3.8* 2.1* 1.6    Recent Results (from the past 240 hour(s))  Surgical PCR screen     Status: None   Collection Time: 08/18/22  3:44 PM   Specimen: Nasal Mucosa; Nasal Swab  Result Value Ref Range Status   MRSA, PCR NEGATIVE NEGATIVE Final   Staphylococcus aureus NEGATIVE NEGATIVE Final    Comment: (NOTE) The Xpert SA Assay (FDA approved for NASAL specimens in patients 73 years of age and older), is one component of a comprehensive surveillance program. It is not intended to diagnose  infection nor to guide or monitor treatment. Performed at [facility], [place] [address], [place], [place] [address]  Radiology Studies: No results found.      Scheduled Meds:  amLODipine  5 mg Oral Daily   insulin aspart  0-15 Units Subcutaneous Q6H   levothyroxine  50 mcg Oral Daily   melatonin  3 mg Oral QHS   metoprolol succinate  12.5 mg Oral Daily   pantoprazole (PROTONIX) IV  40 mg Intravenous Q12H   Continuous Infusions:  sodium chloride 10 mL/hr at 07/29/2022 1216   ampicillin-sulbactam (UNASYN) IV 3 g (08/23/22 0913)   lactated ringers       LOS: 5 days    Time spent: 35 minutes    Yesena Reaves Darleen Crocker, DO [facility]  If 7PM-7AM, please contact night-coverage [email or web address] 08/23/2022, 10:25 AM

## 2022-08-23 NOTE — Progress Notes (Signed)
Orthopedic Surgery Progress Note   Assessment: Patient is a 89 y.o. female with right hip fracture   Plan: -Operative plans: tentatively 09-15-22 -INR is 1.8 this morning, creatinine has increased to 4.3 -Discussed surgery with son including the risks, benefits, and alternatives. Afterwards, he provided informed written consent -Antibiotics: ancef on call to OR -Weight bearing status: NWB RLE -PT/OT evaluate and treat post-operatively -Pain control -NPO at midnight  ___________________________________________________________________________  Subjective: Having pain in her right hip. No pain elsewhere. Son states she has been having trouble urinating and has history of kidney stones. His questions about surgery were answered this morning    Physical Exam:  General: no acute distress, appears stated age Neurologic: alert, answering questions appropriately, following commands Respiratory: unlabored breathing on room air, symmetric chest rise Psychiatric: appropriate affect, normal cadence to speech  MSK:   -Right lower extremity  No tenderness to palpation over extremity, except over the hip. Leg short when compared to contralateral side Plantarflexes and dorsiflexes toes, EHL/TA/GSC intact Sensation intact to light touch in sural, saphenous, tibial, deep peroneal, and superficial peroneal nerve distributions Foot warm and well perfused   Patient name: Ana [name] Patient MRN: [id number] Date: 08/23/22

## 2022-08-23 NOTE — Progress Notes (Signed)
Rounding Note    Patient Name: Ana [name] Date of Encounter: 08/23/2022  [place] Cardiologist: None   Subjective   Appears drowsy, nephrology has seen and increased maintenance fluids. Discussed care with son at bedside.   Inpatient Medications    Scheduled Meds:  amLODipine  5 mg Oral Daily   insulin aspart  0-15 Units Subcutaneous Q6H   levothyroxine  50 mcg Oral Daily   melatonin  3 mg Oral QHS   metoprolol succinate  12.5 mg Oral Daily   pantoprazole (PROTONIX) IV  40 mg Intravenous Q12H   Continuous Infusions:  sodium chloride 10 mL/hr at 07/26/2022 1216   ampicillin-sulbactam (UNASYN) IV 3 g (08/23/22 0913)   lactated ringers 75 mL/hr at 08/23/22 1039   PRN Meds: sodium chloride, acetaminophen **OR** acetaminophen, guaiFENesin, haloperidol lactate, hydrALAZINE, HYDROmorphone (DILAUDID) injection, ipratropium-albuterol, metoprolol tartrate, prochlorperazine, senna-docusate   Vital Signs    Vitals:   08/22/22 2315 08/23/22 0443 08/23/22 0822 08/23/22 0954  BP: (!) 110/48 127/67 136/61 (!) 111/53  Pulse: 65 86 87 89  Resp: '15 18 18   '$ Temp: 98.1 F (36.7 C) 98.4 F (36.9 C) 98.3 F (36.8 C)   TempSrc: Axillary Oral Oral   SpO2: 99% 95% 96%   Weight:  55.7 kg    Height:        Intake/Output Summary (Last 24 hours) at 08/23/2022 1132 Last data filed at 08/23/2022 0148 Gross per 24 hour  Intake 788.99 ml  Output 75 ml  Net 713.99 ml      08/23/2022    4:43 AM 08/22/2022    4:37 AM 08/06/2022    9:07 AM  Last 3 Weights  Weight (lbs) 122 lb 12.7 oz 122 lb 2.2 oz 115 lb 11.9 oz  Weight (kg) 55.7 kg 55.4 kg 52.5 kg      Telemetry    Sinus rhythm.  PVCs.  - Personally Reviewed  ECG    N/a - Personally Reviewed  Physical Exam   VS:  BP (!) 111/53   Pulse 89   Temp 98.3 F (36.8 C) (Oral)   Resp 18   Ht '5\' 2"'$  (1.575 m)   Wt 55.7 kg   SpO2 96%   BMI 22.46 kg/m  , BMI Body mass index is 22.46 kg/m. GENERAL:  Frail.  No acute  distress.  HEENT:  oral mucosa very dry NECK:  No jugular venous distention,  LUNGS:  clear HEART:  RRR.  no rubs, no murmurs ABD:  +bs EXT:  no edema SKIN:  No rashes  NEURO:  not well assessed due to mental status PSYCH: awake but drowsy   Labs    High Sensitivity Troponin:   Recent Labs  Lab 07/26/2022 2246 08/18/22 0243  TROPONINIHS 16 25*     Chemistry Recent Labs  Lab 08/14/2022 2246 08/18/22 1347 08/02/2022 0029 08/22/22 0141 08/23/22 0107  NA 137   < > 139 142 141  K 4.2   < > 4.4 4.3 4.2  CL 107   < > 108 107 110  CO2 19*   < > 20* 20* 19*  GLUCOSE 245*   < > 77 193* 126*  BUN 23   < > 64* 85* 96*  CREATININE 1.16*   < > 2.67* 3.63* 4.30*  CALCIUM 8.7*   < > 7.6* 7.6* 7.5*  PROT 6.9  --   --   --   --   ALBUMIN 2.8*  --   --   --  2.0*  AST 39  --   --   --   --   ALT 15  --   --   --   --   ALKPHOS 69  --   --   --   --   BILITOT 0.9  --   --   --   --   GFRNONAA 45*   < > 17* 12* 9*  ANIONGAP 11   < > '11 15 12   '$ < > = values in this interval not displayed.    Lipids No results for input(s): "CHOL", "TRIG", "HDL", "LABVLDL", "LDLCALC", "CHOLHDL" in the last 168 hours.  Hematology Recent Labs  Lab 08/20/22 0053 08/20/22 1840 08/23/22 0107  WBC 22.3* 13.2* 12.8*  RBC 3.22* 3.15* 3.03*  HGB 9.6* 9.5* 9.0*  HCT 30.3* 28.3* 27.3*  MCV 94.1 89.8 90.1  MCH 29.8 30.2 29.7  MCHC 31.7 33.6 33.0  RDW 13.9 13.9 14.5  PLT 205 214 169   Thyroid No results for input(s): "TSH", "FREET4" in the last 168 hours.  BNP Recent Labs  Lab 07/25/2022 2246 08/18/22 1606 08/11/2022 0029  BNP 688.1* 869.0* 333.6*    DDimer No results for input(s): "DDIMER" in the last 168 hours.   Radiology    US RENAL  Result Date: 08/23/2022 CLINICAL DATA:  Acute renal insufficiency EXAM: RENAL / URINARY TRACT ULTRASOUND COMPLETE COMPARISON:  None Available. FINDINGS: Right Kidney: Renal measurements: 9.5 x 4.5 x 4.8 cm = volume: 107 mL. Increased cortical echogenicity Left Kidney:  Renal measurements: 9.6 x 4.5 x 4.4 cm = volume: 99 mL. Increased cortical echogenicity. Mild hydronephrosis. Bladder: Appears normal for degree of bladder distention. Other: None. IMPRESSION: 1. Increased cortical echogenicity in both kidneys consistent with medical renal disease. 2. Mild left hydronephrosis. Electronically Signed   By: Dorise Bullion III M.D.   On: 08/23/2022 10:54    Cardiac Studies   Echo 07/30/2022:  1. Left ventricular ejection fraction, by estimation, is 55 to 60%. Left  ventricular ejection fraction by 3D volume is 56 %. The left ventricle has  normal function. The left ventricle has no regional wall motion  abnormalities. Left ventricular diastolic   function could not be evaluated.   2. Right ventricular systolic function is mildly reduced. The right  ventricular size is normal. Tricuspid regurgitation signal is inadequate  for assessing PA pressure.   3. Moderate pleural effusion in the left lateral region.   4. The mitral valve is rheumatic. Mild to moderate mitral valve  regurgitation. Moderate mitral annular calcification.   5. There is mild calcification of the aortic valve. There is moderate  thickening of the aortic valve. Aortic valve regurgitation is mild to  moderate.   Patient Profile     Ana [name] is an 34F with HFpEF, hypertension, hyperlipidemia, diabetes, atrial fibrillation/flutter, and chronic anemia admitted after being found down and noted to have a R hip fracture and upper GI bleed. Cardiology consulted for elevated troponin and pre-op evaluation.   Assessment & Plan    # Acute hypoxic respiratory failure:  # HFpEF: # AKI: Aspiration PNA in the setting of hematemesis and being found down.  BNP also mildly elevated and small pleural effusions.    However renal function is worsening and she is increasingly pre-renal.  She is very thirsty and dry. Maintenance fluids initiated, agree. Recommend feeding assistance. Respiratory status is  significantly improved from report.  Antibiotics per primary team and critical care.  - neph has been  consulted  # Pre-op evaluation:  # R hip fracture:  No further cardiac evaluation needed prior to surgery.  Respiratory status significantly improved by report.  Now down to 2L O2. PCCM on board. Planning for surgery on 2022/08/28.  Now with  AKI and colonic distension.  Miralax ordered x2 days.  Hopefully she can have a bowel movement prior to surgery.   # PAF/flutter; Remains in sinus rhythm. INR was supra therapeutic initially.  Warfarin on hold and she received vitamin K.  Metoprolol reduced due to bradycardia. INR now 1.8.  CHADS2VASC score is 7. If no acute bleeding source, may need to use IV heparin for anticoagulation until surgery, consider adding this in next 1-2 days to ensure stability of hemoglobin with improving INR.   # HTN:  Continue amlodipine.  Home losartan on hold.       For questions or updates, please contact [place] Please consult [email or web address] for contact info under        Signed, Elouise Munroe, MD  08/23/2022, 11:32 AM

## 2022-08-23 NOTE — Consult Note (Addendum)
[facility] Nephrology Consultation Note  Requesting MD: Dr. Manuella Ghazi, [place] Reason for consult: AKI  HPI:  Ana [name] is a 89 y.o. female with past medical history significant for hypertension, hypothyroidism, type 2 diabetes, permanent A-fib on Coumadin, pulm hypertension, chronic diastolic CHF who was presented with fall, found down on the floor by family member with hip fracture seen as a consultation for further evaluation and management of acute kidney injury. The family member found her on the floor who brought to the ER.  In the ER she was found to have coffee-ground emesis with a supratherapeutic INR of 3.  Fecal occult blood was positive.  The x-ray showed acute fracture of the right proximal femur.  Seen by orthopedics and planning to do surgery in the next few days.  She also developed acute respiratory failure with hypoxia and suspecting due to aspiration pneumonia.  She is requiring oxygen via nasal cannula and treating with Unasyn. The creatinine level was 0.90 on admission which was gradually trending up to 4.30 today.  Started gentle IV hydration today.  Urine output was recorded only 75 cc.  The kidney ultrasound was obtained which showed increased cortical echogenicity in both kidneys consistent with chronicity and mild left hydro.  The bladder scan showed only 38 cc of urine. She is on losartan and amlodipine at home which is on hold now. The patient is mildly somnolent, using oxygen via nasal cannula.  She was alert awake but denies any specific symptoms.  No nausea, vomiting, chest pain, shortness of breath.  Pain seems to be controlled with current pain management.  Seen by palliative care team.  Her son was presented at the bedside.  PMHx:   Past Medical History:  Diagnosis Date   Anemia of chronic disease    Aortic insufficiency    moderate by echo 03/2018   Chronic diastolic CHF (congestive heart failure) ([place]) 2014   Diabetes mellitus without complication  ([place])    Dizziness 02/13/2015   Hyperlipidemia    Hypertension    Hypothyroidism    MI, old 07/18/2013   Mitral stenosis    mild by echo 03/2018   Permanent atrial fibrillation ([place])    Pulmonary HTN (HCC)    PASP 60mHg 03/2018   Urinary incontinence    Vitamin D deficiency disease     Past Surgical History:  Procedure Laterality Date   CATARACT EXTRACTION     LAPAROSCOPIC APPENDECTOMY N/A 04/14/2017   Procedure: APPENDECTOMY LAPAROSCOPIC;  Surgeon: IFanny Skates MD;  Location: [facility];  Service: General;  Laterality: N/A;   LEFT HEART CATHETERIZATION WITH CORONARY ANGIOGRAM N/A 01/17/2014   Procedure: LEFT HEART CATHETERIZATION WITH CORONARY ANGIOGRAM;  Surgeon: TTroy Sine MD;  [facility];  Service: Cardiovascular;  Laterality: N/A;   PERCUTANEOUS CORONARY STENT INTERVENTION (PCI-S)  01/17/2014   Procedure: PERCUTANEOUS CORONARY STENT INTERVENTION (PCI-S);  Surgeon: TTroy Sine MD;  [facility];  Service: Cardiovascular;;   PERCUTANEOUS CORONARY STENT INTERVENTION (PCI-S) N/A 01/19/2014   Procedure: PERCUTANEOUS CORONARY STENT INTERVENTION (PCI-S);  Surgeon: DLeonie Man MD;  [facility];  Service: Cardiovascular;  Laterality: N/A;   TOTAL HIP ARTHROPLASTY Right 07/29/2022   Procedure: RIGHT TOTAL HIP ARTHROPLASTY VS HEMIARTHROPLASTY (ANTERIOR APPROACH);  Surgeon: DMeredith Pel MD;  [facility]  Service: Orthopedics;  Laterality: Right;    Family Hx:  Family History  Problem Relation Age of Onset   Cancer Mother  ent   Cirrhosis Father    Heart attack Sister    Heart disease Sister    Cancer Sister 60       breast cancer   Diabetes Brother    Cancer Other 30       breast    Social History:  reports that she has never smoked. She has never used smokeless tobacco. She reports that she does not drink alcohol and does not use drugs.  Allergies:  Allergies  Allergen Reactions   Welchol [Colesevelam Hcl] Nausea  Only   Zetia [Ezetimibe] Other (See Comments)    Edema    Crestor [Rosuvastatin] Other (See Comments)    Unknown   Statins Other (See Comments)    Unknown   Zocor [Simvastatin] Other (See Comments)    Muscle weakness    Medications: Prior to Admission medications   Medication Sig Start Date End Date Taking? Authorizing Provider  amLODipine (NORVASC) 5 MG tablet TAKE 1 TABLET BY MOUTH ONCE DAILY. MUST  KEEP  UPCOMING  APPOINTMENT  IN  AUGUST  2023  FOR  ADDITIONAL  REFILLS Patient taking differently: Take 5 mg by mouth daily. 03/05/22  Yes Turner, Eber Hong, MD  Cholecalciferol (VITAMIN D3) 2000 UNITS TABS Take 2,000 Units by mouth every evening.   Yes [provider]  Ferrous Sulfate (SLOW FE PO) Take 1 tablet by mouth daily.   Yes [provider]  levothyroxine (SYNTHROID) 50 MCG tablet Take 50 mcg by mouth daily. 12/27/21  Yes [provider]  losartan (COZAAR) 100 MG tablet Take 1 tablet by mouth once daily 03/04/22  Yes Turner, Eber Hong, MD  metFORMIN (GLUCOPHAGE) 1000 MG tablet Take 1 tablet (1,000 mg total) by mouth 2 (two) times daily with a meal. Patient taking differently: Take 1,000 mg by mouth daily. 02/16/14  Yes Turner, Eber Hong, MD  pravastatin (PRAVACHOL) 40 MG tablet TAKE 1 TABLET BY MOUTH ONCE DAILY IN THE EVENING 03/04/22  Yes Turner, Eber Hong, MD  vitamin B-12 (CYANOCOBALAMIN) 1000 MCG tablet Take 1,000 mcg by mouth daily.   Yes [provider]  warfarin (COUMADIN) 2.5 MG tablet Take 1/2 tablet daily except 1 tablet on Monday and Friday or as directed by Anticoagulation Clinic. Patient taking differently: Take 2.5 mg by mouth See admin instructions. 1 tablet on Monday and Friday.  Tab on Tue, Wed, Thu, Sat, Sun. 05/30/22  Yes Sueanne Margarita, MD  acetaminophen (TYLENOL) 500 MG tablet Take 500 mg by mouth every 8 (eight) hours as needed for moderate pain or headache.  Patient not taking: Reported on 08/18/2022    [provider]  aspirin  EC 81 MG tablet Take 81 mg by mouth daily. Patient not taking: Reported on 08/18/2022    [provider]  Coenzyme Q10 200 MG capsule Take 200 mg by mouth daily. Patient not taking: Reported on 08/18/2022    [provider]  Omega-3 Krill Oil 500 MG CAPS Take 500 mg by mouth daily.  Patient not taking: Reported on 08/18/2022    [provider]  phenylephrine-shark liver oil-mineral oil-petrolatum (PREPARATION H) 0.25-3-14-71.9 % rectal ointment Place 1 application rectally 2 (two) times daily as needed for hemorrhoids. Patient not taking: Reported on 08/18/2022    [provider]  polyethylene glycol (MIRALAX / GLYCOLAX) packet Take 17 g by mouth daily as needed for moderate constipation.  Patient not taking: Reported on 08/18/2022    [provider]    I have reviewed the patient's  current medications.  Labs: Renal Panel: Recent Labs  Lab 08/13/2022 0704 08/20/22 0053 08/11/2022 0029 08/22/22 0141 08/23/22 0107  NA 139 140 139 142 141  K 4.3 4.5 4.4 4.3 4.2  CL 106 107 108 107 110  CO2 21* 20* 20* 20* 19*  GLUCOSE 185* 160* 77 193* 126*  BUN 36* 50* 64* 85* 96*  CREATININE 1.36* 1.76* 2.67* 3.63* 4.30*  CALCIUM 8.3* 8.0* 7.6* 7.6* 7.5*  PHOS  --   --   --   --  5.0*     CBC:    Latest Ref Rng & Units 08/23/2022    1:07 AM 08/20/2022    6:40 PM 08/20/2022   12:53 AM  CBC  WBC 4.0 - 10.5 K/uL 12.8  13.2  22.3   Hemoglobin 12.0 - 15.0 g/dL 9.0  9.5  9.6   Hematocrit 36.0 - 46.0 % 27.3  28.3  30.3   Platelets 150 - 400 K/uL 169  214  205      Anemia Panel:  Recent Labs    08/09/2022 0704 07/25/2022 1916 08/20/22 0053 08/20/22 1840 08/23/22 0107  HGB 10.1* 9.2* 9.6* 9.5* 9.0*  MCV 90.4 92.6 94.1 89.8 90.1    Recent Labs  Lab 08/16/2022 2246 08/23/22 0107  AST 39  --   ALT 15  --   ALKPHOS 69  --   BILITOT 0.9  --   PROT 6.9  --   ALBUMIN 2.8* 2.0*    Lab Results  Component Value Date   HGBA1C 6.1 (H) 08/18/2022     ROS:  Pertinent items noted in HPI and remainder of comprehensive ROS otherwise negative.  Physical Exam: Vitals:   08/23/22 0822 08/23/22 0954  BP: 136/61 (!) 111/53  Pulse: 87 89  Resp: 18   Temp: 98.3 F (36.8 C)   SpO2: 96%      General exam: Elderly looking female, somnolent but able to answer when I ask with question.    Respiratory system: Clear to auscultation. Respiratory effort normal. No wheezing or crackle Cardiovascular system: S1 & S2 heard, RRR.  No pedal edema. Gastrointestinal system: Abdomen is nondistended, soft and nontender. Normal bowel sounds heard. Central nervous system: Alert and oriented. No focal neurological deficits. Extremities: No edema.  No cyanosis Skin: No rashes, lesions or ulcers Psychiatry: Alert awake but somnolent..   Assessment/Plan:  # Acute kidney injury presumably due to prerenal/hemodynamic changes in the setting of relative hypotension, pneumonia, anemia, + use of ARB at home.  UA with proteinuria.  I will repeat UA, UPC.  CPK not much elevated.  Kidney ultrasound with chronicity and mild left hydro.  Bladder scan no urinary retention.  No other nephrotoxins including NSAIDs, IV contrast identified. I agree with continuing IV fluid, switch IVF to sodium bicarbonate.  I will order few doses of IV albumin.  Strict ins and out and daily lab monitoring. Hopefully the kidney function gradually improve in next few days.  I have discussed with the patient's son that given her age, comorbidities etc. she might not do well with long-term dialysis and certainly doesn't increase the quality of life.  Palliative care team is already involved and patient is DNR.  Continue to hold ARB.  # Metabolic acidosis due to AKI: Adding sodium bicarbonate and IV fluid.  Monitor lab.  # Acute metabolic encephalopathy: May have some component of uremia as well.  She is also on opioids for pain management.  Continue supportive care management as above.  #  Anemia/coffee-ground emesis with supratherapeutic INR on admission: Monitor hemoglobin.  # Right femur fracture, fall: Ortho is planning for OR possibly tomorrow.  Discussed with the patient's son, nursing staffs. Thank you for the consult.  We will continue to follow.  Zoa Dowty Tanna Furry 08/23/2022, 11:26 AM  [facility].

## 2022-08-23 NOTE — Progress Notes (Signed)
I agree with progress note placed by Flossie Buffy, NP.  Course becoming further complicated by AKI. Nephrology is following. No increase in O2 needs since yesterday. Continue unasyn for aspiration.   PCCM to see again on 3/4. Please call if needed.  Freda Jackson, MD [facility] Office: [phone]   See Amion for personal pager PCCM on call pager [phone] until 7pm. Please call Elink 7p-7a. [phone]

## 2022-08-23 NOTE — Progress Notes (Signed)
NAME:  Ana [name], MRN:  [id number], DOB:  04-29-1932, LOS: 5 ADMISSION DATE:  08/08/2022, CONSULTATION DATE: 08/20/2022 REFERRING MD: [facility], CHIEF COMPLAINT: Hypoxia  History of Present Illness:  89 year old female who lives alone and was independent until she was found down for approximately 24 hours noted to have vomited blood aspirated and had a fractured right hip.  She was admitted to the hospital was requiring high flow nasal cannula and noninvasive mechanical ventilatory support to maintain adequate oxygenation.  She has been on broad-spectrum antibiotics and has been diuresed and is slowly improving with decreasing FiO2 needs.  Pulmonary critical care has been asked to evaluate for possible surgical intervention for repair of right hip.  We wait at least 2 more days to give her time to equilibrate and response to the current treatment which appears to be working.  Having said that she is due to her age and multiple comorbidities a surgical risk.  Pertinent  Medical History   Past Medical History:  Diagnosis Date   Anemia of chronic disease    Aortic insufficiency    moderate by echo 03/2018   Chronic diastolic CHF (congestive heart failure) ([place]) 2014   Diabetes mellitus without complication ([place])    Dizziness 02/13/2015   Hyperlipidemia    Hypertension    Hypothyroidism    MI, old 07/18/2013   Mitral stenosis    mild by echo 03/2018   Permanent atrial fibrillation (HCC)    Pulmonary HTN (HCC)    PASP 79mHg 03/2018   Urinary incontinence    Vitamin D deficiency disease      Significant Hospital Events: Including procedures, antibiotic start and stop dates in addition to other pertinent events   [facility] Having GIB  2/27 palliative consult 2/28 PCCM consult 3/2 worse renal failure, still nebulous operative plan   Interim History / Subjective:   Lots of pain this morning  Down to 3L  Scant UOP  Cr up to 4s   INR 1.8    Objective   Blood pressure 136/61, pulse 87, temperature 98.3 F (36.8 C), temperature source Oral, resp. rate 18, height '5\' 2"'$  (1.575 m), weight 55.7 kg, SpO2 96 %.    FiO2 (%):  [36 %] 36 %   Intake/Output Summary (Last 24 hours) at 08/23/2022 0937 Last data filed at 08/23/2022 0148 Gross per 24 hour  Intake 788.99 ml  Output 75 ml  Net 713.99 ml   Filed Weights   08/14/2022 0907 08/22/22 0437 08/23/22 0443  Weight: 52.5 kg 55.4 kg 55.7 kg    Examination: General: Frail cachectic elderly F  HEENT:  NCAT dry mm and tongue  Neuro: Following commands  CV: rrr PULM: Scattered coarse sounds  GI: thin ndnt  Extremities:  Skin: friable. C/d/w   Resolved Hospital Problem list     Assessment & Plan:   Acute hypoxic respiratory failure Aspiration PNA, bilateral, R>L P -cont to wean O2 as able -usayn for aspiration PNA -IS -PRN duoneb, PRN guaifenesin  -dys 2 diet when taking POs -- is current NPO   R hip fx -ortho is following for operative plan -family seems to be under impression this would be Sat 3/2, but not clear if this is definitive plan to me   AKI Hx renal calculi  P -500cc LR bolus now -bladder scan  -primary has ordered renal [place] -add on CK  GIB Supratherapeutic INR -received 10 mg Vit K 3/1  -PPI -  holding warfarin  HTN Hx Afib -- currently sinus Hx HF  -metop, norvasc, PRN hydral  -holding warfarin  -holding home losartan -cards is following   Hypothyroidism -Synthroid    PCCM will be available PRN Please let us know if the patient's clinical status changes or if we can be of further assistance   Best Practice (right click and "Reselect all SmartList Selections" daily)   Diet/type: NPO DVT prophylaxis: not indicated GI prophylaxis: PPI Lines: N/A Foley:  N/A Code Status:  DNR Last date of multidisciplinary goals of care discussion [--] Son updated 3/2 at bedside   Labs   CBC: Recent Labs  Lab 07/26/2022 0704 07/30/2022 1916  08/20/22 0053 08/20/22 1840 08/23/22 0107  WBC 21.3* 20.9* 22.3* 13.2* 12.8*  HGB 10.1* 9.2* 9.6* 9.5* 9.0*  HCT 30.1* 28.7* 30.3* 28.3* 27.3*  MCV 90.4 92.6 94.1 89.8 90.1  PLT 207 207 205 214 [id number]    Basic Metabolic Panel: Recent Labs  Lab 07/26/2022 0704 08/20/22 0053 08/20/2022 0029 08/22/22 0141 08/23/22 0107  NA 139 140 139 142 141  K 4.3 4.5 4.4 4.3 4.2  CL 106 107 108 107 110  CO2 21* 20* 20* 20* 19*  GLUCOSE 185* 160* 77 193* 126*  BUN 36* 50* 64* 85* 96*  CREATININE 1.36* 1.76* 2.67* 3.63* 4.30*  CALCIUM 8.3* 8.0* 7.6* 7.6* 7.5*  PHOS  --   --   --   --  5.0*   GFR: Estimated Creatinine Clearance: 7.2 mL/min (A) (by C-G formula based on SCr of 4.3 mg/dL (H)). Recent Labs  Lab 07/31/2022 2246 08/18/22 0243 08/18/22 0648 08/18/22 1347 08/18/22 1859 08/09/2022 1916 08/20/22 0053 08/20/22 1840 08/23/22 0107  PROCALCITON  --   --   --  0.15  --   --   --   --   --   WBC 10.6*  --   --   --    < > 20.9* 22.3* 13.2* 12.8*  LATICACIDVEN 4.2* 3.8* 2.1* 1.6  --   --   --   --   --    < > = values in this interval not displayed.    Liver Function Tests: Recent Labs  Lab 08/14/2022 2246 08/23/22 0107  AST 39  --   ALT 15  --   ALKPHOS 69  --   BILITOT 0.9  --   PROT 6.9  --   ALBUMIN 2.8* 2.0*   No results for input(s): "LIPASE", "AMYLASE" in the last 168 hours. No results for input(s): "AMMONIA" in the last 168 hours.  ABG    Component Value Date/Time   PHART 7.37 07/27/2022 0400   PCO2ART 36 08/11/2022 0400   PO2ART 68 (L) 08/14/2022 0400   HCO3 20.9 07/25/2022 0400   TCO2 18 (L) 08/01/2022 2245   ACIDBASEDEF 4.0 (H) 08/05/2022 0400   O2SAT 96.3 08/15/2022 0400     Coagulation Profile: Recent Labs  Lab 08/12/2022 0028 08/20/22 0053 07/26/2022 0029 08/22/22 0141 08/23/22 0107  INR 1.6* 1.4* 1.8* 1.9* 1.8*    Cardiac Enzymes: Recent Labs  Lab 08/14/2022 2246 07/28/2022 0028  CKTOTAL 379* 670*    HbA1C: Hgb A1c MFr Bld  Date/Time Value Ref  Range Status  08/18/2022 07:20 AM 6.1 (H) 4.8 - 5.6 % Final    Comment:    (NOTE)         Prediabetes: 5.7 - 6.4         Diabetes: >6.4  Glycemic control for adults with diabetes: <7.0   01/14/2014 04:45 PM 7.5 (H) <5.7 % Final    Comment:    (NOTE)                                                                       According to the ADA Clinical Practice Recommendations for 2011, when HbA1c is used as a screening test:  >=6.5%   Diagnostic of Diabetes Mellitus           (if abnormal result is confirmed) 5.7-6.4%   Increased risk of developing Diabetes Mellitus References:Diagnosis and Classification of Diabetes Mellitus,Diabetes [id number] 1):S62-S69 and Standards of Medical Care in         Diabetes - 2011,Diabetes [id number] (Suppl 1):S11-S61.    CBG: Recent Labs  Lab 08/22/22 1044 08/22/22 1641 08/22/22 2112 08/23/22 0017 08/23/22 0611  GLUCAP 131* 97 126* 125* 45*    Eliseo Gum MSN, AGACNP-BC [place] for pager  08/23/2022, 9:37 AM

## 2022-08-23 NOTE — Progress Notes (Signed)
Patient only had 75cc urine output over 12 hours, creatinine =4.30. Dr. Marlowe Sax notified.

## 2022-08-24 DIAGNOSIS — Z515 Encounter for palliative care: Secondary | ICD-10-CM | POA: Diagnosis not present

## 2022-08-24 DIAGNOSIS — S72051A Unspecified fracture of head of right femur, initial encounter for closed fracture: Secondary | ICD-10-CM | POA: Diagnosis not present

## 2022-08-24 DIAGNOSIS — Z7189 Other specified counseling: Secondary | ICD-10-CM

## 2022-08-24 LAB — ALBUMIN: Albumin: 2.9 g/dL — ABNORMAL LOW (ref 3.5–5.0)

## 2022-08-24 LAB — GLUCOSE, CAPILLARY
Glucose-Capillary: 149 mg/dL — ABNORMAL HIGH (ref 70–99)
Glucose-Capillary: 187 mg/dL — ABNORMAL HIGH (ref 70–99)

## 2022-08-24 LAB — CBC
HCT: 26 % — ABNORMAL LOW (ref 36.0–46.0)
Hemoglobin: 8.5 g/dL — ABNORMAL LOW (ref 12.0–15.0)
MCH: 29.8 pg (ref 26.0–34.0)
MCHC: 32.7 g/dL (ref 30.0–36.0)
MCV: 91.2 fL (ref 80.0–100.0)
Platelets: 136 10*3/uL — ABNORMAL LOW (ref 150–400)
RBC: 2.85 MIL/uL — ABNORMAL LOW (ref 3.87–5.11)
RDW: 14.5 % (ref 11.5–15.5)
WBC: 11.6 10*3/uL — ABNORMAL HIGH (ref 4.0–10.5)
nRBC: 0 % (ref 0.0–0.2)

## 2022-08-24 LAB — PROTIME-INR
INR: 1.5 — ABNORMAL HIGH (ref 0.8–1.2)
Prothrombin Time: 18 seconds — ABNORMAL HIGH (ref 11.4–15.2)

## 2022-08-24 LAB — BASIC METABOLIC PANEL
Anion gap: 15 (ref 5–15)
BUN: 100 mg/dL — ABNORMAL HIGH (ref 8–23)
CO2: 20 mmol/L — ABNORMAL LOW (ref 22–32)
Calcium: 8 mg/dL — ABNORMAL LOW (ref 8.9–10.3)
Chloride: 110 mmol/L (ref 98–111)
Creatinine, Ser: 4.52 mg/dL — ABNORMAL HIGH (ref 0.44–1.00)
GFR, Estimated: 9 mL/min — ABNORMAL LOW (ref >60)
Glucose, Bld: 188 mg/dL — ABNORMAL HIGH (ref 70–99)
Potassium: 4.3 mmol/L (ref 3.5–5.1)
Sodium: 145 mmol/L (ref 135–145)

## 2022-08-24 LAB — MAGNESIUM: Magnesium: 1.9 mg/dL (ref 1.7–2.4)

## 2022-08-24 MED ORDER — GLYCOPYRROLATE 1 MG PO TABS: 1.0000 mg | ORAL_TABLET | ORAL | Status: DC | PRN

## 2022-08-24 MED ORDER — MORPHINE 100MG IN NS 100ML (1MG/ML) PREMIX INFUSION
1.0000 mg/h | INTRAVENOUS | Status: DC
  Administered 2022-08-24: 1 mg/h via INTRAVENOUS
  Filled 2022-08-24: qty 100

## 2022-08-24 MED ORDER — LORAZEPAM 2 MG/ML IJ SOLN: 1.0000 mg | INTRAMUSCULAR | Status: DC | PRN

## 2022-08-24 MED ORDER — LORAZEPAM 2 MG/ML PO CONC: 1.0000 mg | ORAL | Status: DC | PRN

## 2022-08-24 MED ORDER — GLYCOPYRROLATE 0.2 MG/ML IJ SOLN: 0.2000 mg | INTRAMUSCULAR | Status: DC | PRN

## 2022-08-24 MED ORDER — LORAZEPAM 1 MG PO TABS: 1.0000 mg | ORAL_TABLET | ORAL | Status: DC | PRN

## 2022-09-22 NOTE — Death Summary Note (Signed)
DEATH SUMMARY   Patient Details  Name: Ana [name] MRN: [id number] DOB: Nov 28, 1931 [id number], Marjory Lies, MD Admission/Discharge Information   Admit Date:  19-Aug-2022  Date of Death: Date of Death: 08/26/22  Time of Death: Time of Death: 09/03/1642  Length of Stay: 6   Principle Cause of death: Acute hypoxemic respiratory failure with aspiration pneumonitis and worsening AKI.  Hospital Diagnoses: Principal Problem:   Femur fracture, right ([place]) Active Problems:   Persistent atrial fibrillation (HCC)   Chronic diastolic CHF (congestive heart failure) (HCC)   Essential hypertension   Coronary atherosclerosis of native coronary artery   HLD (hyperlipidemia)   GI bleed   Supratherapeutic INR   Lactic acidosis   Fall at home, initial encounter   Coffee ground emesis   Closed fracture of right hip (HCC)   PAF (paroxysmal atrial fibrillation) (HCC)   Acute respiratory failure with hypoxia (HCC)   Aspiration pneumonia of both lower lobes (HCC)   AKI (acute kidney injury) [facility])   Hospital Course: Ana [name] is an 89 y.o. female past medical history of diabetes mellitus, essential hypertension hypothyroidism, permanent atrial fibrillation on Coumadin and pulmonary hypertension who family members found her on the floor brought into the ED while in the ED developed coffee-ground emesis, with a supratherapeutic INR of 3, FOBT positive hemoglobin of 11, was complaining of right hip pain x-ray showed acute fracture of the right proximal femur, x-ray of the knee showed degenerative changes, CT of the head showed no acute findings chronic ischemic microangiopathy, C-spine showed no fracture or dislocation.  Chest x-ray with findings suggestive of aspiration pneumonia.   She was being considered for hip fracture repair, however her INR was too high and this was being reversed. In the meantime, her AKI began to worsen and she became oliguric. She was not a candidate for hemodialysis and therefore she  was too high risk for operative intervention. Overnight on 3/2, she began to have worsening respiratory distress that required use of bipap. Her prognosis was grave and she was made comfort care after discussion with son at bedside on 26-Aug-2022. She expired several hours later on morphine infusion at 1644.  Procedures: None  Consultations: Palliative, Orthopedics, Nephrology, Cardiology, PCCM  The results of significant diagnostics from this hospitalization (including imaging, microbiology, ancillary and laboratory) are listed below for reference.   Significant Diagnostic Studies: US RENAL  Result Date: 08/23/2022 CLINICAL DATA:  Acute renal insufficiency EXAM: RENAL / URINARY TRACT ULTRASOUND COMPLETE COMPARISON:  None Available. FINDINGS: Right Kidney: Renal measurements: 9.5 x 4.5 x 4.8 cm = volume: 107 mL. Increased cortical echogenicity Left Kidney: Renal measurements: 9.6 x 4.5 x 4.4 cm = volume: 99 mL. Increased cortical echogenicity. Mild hydronephrosis. Bladder: Appears normal for degree of bladder distention. Other: None. IMPRESSION: 1. Increased cortical echogenicity in both kidneys consistent with medical renal disease. 2. Mild left hydronephrosis. Electronically Signed   By: Dorise Bullion III M.D.   On: 08/23/2022 10:54   DG CHEST PORT 1 VIEW  Result Date: 08/20/2022 CLINICAL DATA:  Shortness of breath EXAM: PORTABLE CHEST 1 VIEW COMPARISON:  CXR 08/18/22 FINDINGS: Small bilateral pleural effusions unchanged from prior exam. Unchanged cardiac mediastinal contours. No pneumothorax. There is persistent heterogeneous opacities in the right-greater-than-left lung. Compared to prior exam there is slight interval increase in a hazy opacity in the peripheral aspect of the right lower lung. Visualized upper abdomen is notable for marked colonic distention, better assessed on recent prior CT abdomen and pelvis. No  pneumoperitoneum. IMPRESSION: 1. Persistent heterogeneous opacities in the  right-greater-than-left lung, with a new hazy opacity in the peripheral aspect of the right lower lung. This may represent atelectasis or pneumonia. 2. Unchanged small bilateral pleural effusions. 3. Marked colonic distention, better assessed on recent prior CT abdomen and pelvis. Electronically Signed   By: Marin Roberts M.D.   On: 08/20/2022 09:29   ECHOCARDIOGRAM LIMITED  Result Date: 07/31/2022    ECHOCARDIOGRAM LIMITED REPORT   Patient Name:   Ana [name] Date of Exam: 08/14/2022 Medical Rec #:  [id number]    Height:       62.0 in Accession #:    [id number]   Weight:       116.4 lb Date of Birth:  Apr 24, 1932   BSA:          1.519 m Patient Age:    76 years     BP:           136/72 mmHg Patient Gender: F            HR:           74 bpm. Exam Location:  Inpatient Procedure: Limited Echo, Color Doppler, Limited Color Doppler, Strain Analysis            and 3D Echo Indications:    PRE-OP  History:        Patient has prior history of Echocardiogram examinations, most                 recent 03/18/2022. CHF, Previous Myocardial Infarction,                 Signs/Symptoms:Dyspnea; Risk Factors:Hypertension, Dyslipidemia                 and Non-Smoker.  Sonographer:    Wilkie Aye RVT RCS Referring Phys: [id number] TIFFANY Halma IMPRESSIONS  1. Left ventricular ejection fraction, by estimation, is 55 to 60%. Left ventricular ejection fraction by 3D volume is 56 %. The left ventricle has normal function. The left ventricle has no regional wall motion abnormalities. Left ventricular diastolic  function could not be evaluated.  2. Right ventricular systolic function is mildly reduced. The right ventricular size is normal. Tricuspid regurgitation signal is inadequate for assessing PA pressure.  3. Moderate pleural effusion in the left lateral region.  4. The mitral valve is rheumatic. Mild to moderate mitral valve regurgitation. Moderate mitral annular calcification.  5. There is mild calcification of the aortic valve.  There is moderate thickening of the aortic valve. Aortic valve regurgitation is mild to moderate. Comparison(s): No significant change from prior study. Prior images reviewed side by side. FINDINGS  Left Ventricle: Left ventricular ejection fraction, by estimation, is 55 to 60%. Left ventricular ejection fraction by 3D volume is 56 %. The left ventricle has normal function. The left ventricle has no regional wall motion abnormalities. Left ventricular diastolic function could not be evaluated. Right Ventricle: The right ventricular size is normal. No increase in right ventricular wall thickness. Right ventricular systolic function is mildly reduced. Tricuspid regurgitation signal is inadequate for assessing PA pressure. Right Atrium: Right atrial size was normal in size. Pericardium: Trivial pericardial effusion is present. Mitral Valve: Transmitral gradients were not assessed. 2D images suggests mild mitral stenosis. The mitral valve is rheumatic. There is moderate thickening of the anterior and posterior mitral valve leaflet(s). There is mild calcification of the anterior  and posterior mitral valve leaflet(s). Mildly decreased mobility of the mitral valve leaflets.  Moderate mitral annular calcification. Mild to moderate mitral valve regurgitation, with centrally-directed jet. Tricuspid Valve: Tricuspid valve regurgitation is mild. Aortic Valve: There is mild calcification of the aortic valve. There is moderate thickening of the aortic valve. Aortic valve regurgitation is mild to moderate. Pulmonic Valve: The pulmonic valve was not well visualized. Aorta: The aortic root and ascending aorta are structurally normal, with no evidence of dilitation. IAS/Shunts: No atrial level shunt detected by color flow Doppler. Additional Comments: There is a moderate pleural effusion in the left lateral region.  LEFT VENTRICLE PLAX 2D LVIDd:         4.00 cm LVIDs:         2.40 cm         2D LV PW:         1.20 cm          Longitudinal LV IVS:        1.20 cm         Strain                                2D Strain GLS  -14.1 %                                Avg: LV Volumes (MOD) LV vol d, MOD    90.4 ml       3D Volume EF A2C:                           LV 3D EF:    Left LV vol d, MOD    68.8 ml                    ventricul A4C:                                        ar LV vol s, MOD    32.0 ml                    ejection A2C:                                        fraction LV vol s, MOD    34.8 ml                    by 3D A4C:                                        volume is LV SV MOD A2C:   58.4 ml                    56 %. LV SV MOD A4C:   68.8 ml LV SV MOD BP:    52.4 ml                                3D Volume EF:  3D EF:        56 % RIGHT VENTRICLE RV S prime:     7.56 cm/s TAPSE (M-mode): 1.6 cm LEFT ATRIUM             Index        RIGHT ATRIUM           Index LA diam:        3.40 cm 2.24 cm/m   RA Area:     12.50 cm LA Vol (A2C):   46.5 ml 30.62 ml/m  RA Volume:   29.30 ml  19.29 ml/m LA Vol (A4C):   35.1 ml 23.11 ml/m LA Biplane Vol: 43.2 ml 28.44 ml/m Sanda Klein MD Electronically signed by Sanda Klein MD Signature Date/Time: 08/06/2022/2:11:24 PM    Final    DG CHEST PORT 1 VIEW  Result Date: 08/05/2022 CLINICAL DATA:  Hypoxia. EXAM: PORTABLE CHEST 1 VIEW COMPARISON:  08/12/2022. FINDINGS: The heart is enlarged and the mediastinal contour stable. There is atherosclerotic calcification of the aorta. Consolidation is noted in the right upper lobe. Perihilar airspace disease is present bilaterally. There is a small left pleural effusion. No pneumothorax. Degenerative changes are noted in the thoracic spine. No acute osseous abnormality. IMPRESSION: 1. Right upper lobe consolidation with perihilar airspace disease bilaterally, concerning for multifocal pneumonia and/or superimposed edema. 2. Small left pleural effusion. 3. Cardiomegaly. Electronically Signed   By: Brett Fairy M.D.    On: 08/11/2022 00:17   DG Knee Complete 4 Views Left  Result Date: 08/18/2022 CLINICAL DATA:  Recent fall with left knee discomfort and bruising EXAM: LEFT KNEE - COMPLETE 4+ VIEW COMPARISON:  10/11/2013 FINDINGS: No visible knee joint effusion. Medial compartment joint space narrowing and osteophyte formation consistent with osteoarthritis. Bony irregularity of the posterior margin of the patella consistent with chondromalacia patella. No acute traumatic finding. Extensive regional arterial calcification is present. IMPRESSION: No acute or traumatic finding. Medial compartment osteoarthritis. Chondromalacia patella. Extensive regional arterial calcification. Electronically Signed   By: Nelson Chimes M.D.   On: 08/18/2022 10:18   CT ANGIO GI BLEED  Result Date: 08/18/2022 CLINICAL DATA:  Coffee ground emesis. Fell yesterday with acute proximal right femoral fracture. EXAM: CTA ABDOMEN AND PELVIS WITHOUT AND WITH CONTRAST TECHNIQUE: Multidetector CT imaging of the abdomen and pelvis was performed using the standard protocol during bolus administration of intravenous contrast. Multiplanar reconstructed images and MIPs were obtained and reviewed to evaluate the vascular anatomy. RADIATION DOSE REDUCTION: This exam was performed according to the departmental dose-optimization program which includes automated exposure control, adjustment of the mA and/or kV according to patient size and/or use of iterative reconstruction technique. CONTRAST:  68m OMNIPAQUE IOHEXOL 350 MG/ML SOLN COMPARISON:  AP pelvis and femoral x-rays yesterday. CT abdomen pelvis with contrast 09/29/17, CTA chest, abdomen and pelvis 01/14/2014. FINDINGS: VASCULAR Aorta: There are moderate calcific plaques progressive from 2015 without aneurysm, dissection or critical stenosis. Celiac: Progress ostial calcific plaques with 75% vessel origin stenosis, otherwise opacifies well but with branch vessel atherosclerosis also seen. SMA: There are calcific  plaques proximally with up to 40% stenosis in the proximal 1 cm. There is no other flow-limiting stenosis. No branch occlusion. Renals: Both renal arteries are patent without evidence of aneurysm, dissection, vasculitis, fibromuscular dysplasia or significant stenosis. IMA: Severe calcific origin stenosis. The vessel otherwise opacifies well. No branch occlusion. Inflow: Moderate patchy calcific plaques. No flow-limiting inflow stenosis. Proximal Outflow: There are calcific plaques heaviest in the superficial femoral arteries with at  least 60% stenosis in the visualized portions. Veins: Patent. Review of the MIP images confirms the above findings. NON-VASCULAR Lower chest: Mild cardiomegaly. There is calcification in the coronary arteries and mitral ring. Minimal pericardial effusion. There are moderate layering pleural effusions. There is patchy ground-glass disease in the lung bases which could be due to edema or pneumonia . Hepatobiliary: Respiratory motion limits fine detail. There is no obvious liver mass. Gallbladder and bile ducts are unremarkable, as visualized. Pancreas: Atrophic and otherwise unremarkable. Spleen: Unremarkable. Adrenals/Urinary Tract: There is no adrenal mass. No renal mass is seen through the motion artifact. There is urothelial thickening and stranding involving the left renal pelvis and proximal ureter concerning for ascending UTI, with mild thickening of the bladder and perivesical stranding compatible with cystitis. No urinary stone or obstruction is seen. Stomach/Bowel: The stomach distended with air and fluid and has a normal wall thickness. The small bowel is decompressed. There is wall thickening or underdistention in the distal ascending, transverse and descending colon. Large stool impaction in the rectum and possible stercoral proctitis. No contrast extravasation into the stomach or bowel is seen. An appendix is not seen in this patient. Lymphatic: There is body wall anasarca  and generalized mesenteric congestive changes. Reproductive: Uterus and bilateral adnexa are unremarkable. Other: Small amount of scattered abdominal and pelvic free ascites. No drainable pocket. No free air. There is no incarcerated hernia. Musculoskeletal: Osteopenia, levoscoliosis and degenerative change lumbar spine. Transverse midcervical proximal right femoral fracture is again noted with impaction and mild cephalad displacement. IMPRESSION: 1. Aortic and branch vessel atherosclerosis. 2. 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 3. No contrast extravasation into the bowel is seen. 4. Moderate pleural effusions with patchy ground-glass disease in the lung bases which could be due to edema or pneumonia. 5. Cardiomegaly with calcific CAD. 6. Body wall anasarca, mesenteric congestion and small amount of scattered abdominal and pelvic free ascites. 7. Probable cystitis and likely left-sided ascending UTI. 8. Large stool impaction in the rectum with possible stercoral proctitis. 9. Colitis versus nondistention of the distal ascending, transverse and descending colon. 10. Distended stomach with air and fluid. No small bowel dilatation. 11. Transverse midcervical proximal right femoral fracture with impaction and mild cephalad displacement. 12. Osteopenia and degenerative change. Aortic Atherosclerosis (ICD10-I70.0). Electronically Signed   By: Telford Nab M.D.   On: 08/18/2022 05:02   DG Femur Portable 1 View Left  Result Date: 08/09/2022 CLINICAL DATA:  Trauma, fall. EXAM: LEFT FEMUR PORTABLE 1 VIEW COMPARISON:  Fall pelvis radiograph earlier today. FINDINGS: Divided frontal views of the left femur obtained. Cortical margins of the left femur are intact. No left femur fracture on this single view. Knee alignment is maintained. The included pubic rami are intact. Prominent vascular calcifications. IMPRESSION: 1. No left femur fracture on divided  frontal view. 2. Prominent vascular calcifications. Electronically Signed   By: Keith Rake M.D.   On: 07/26/2022 23:52   CT HEAD WO CONTRAST  Result Date: 08/16/2022 CLINICAL DATA:  Head trauma EXAM: CT HEAD WITHOUT CONTRAST CT CERVICAL SPINE WITHOUT CONTRAST TECHNIQUE: Multidetector CT imaging of the head and cervical spine was performed following the standard protocol without intravenous contrast. Multiplanar CT image reconstructions of the cervical spine were also generated. RADIATION DOSE REDUCTION: This exam was performed according to the departmental dose-optimization program which includes automated exposure control, adjustment of the mA and/or kV according to patient size and/or use of iterative reconstruction technique.  COMPARISON:  None Available. FINDINGS: CT HEAD FINDINGS Brain: There is no mass, hemorrhage or extra-axial collection. The size and configuration of the ventricles and extra-axial CSF spaces are normal. There is hypoattenuation of the periventricular white matter, most commonly indicating chronic ischemic microangiopathy. Vascular: Atherosclerotic calcification of the vertebral and internal carotid arteries at the skull base. No abnormal hyperdensity of the major intracranial arteries or dural venous sinuses. Skull: The visualized skull base, calvarium and extracranial soft tissues are normal. Sinuses/Orbits: No fluid levels or advanced mucosal thickening of the visualized paranasal sinuses. No mastoid or middle ear effusion. The orbits are normal. CT CERVICAL SPINE FINDINGS Alignment: No static subluxation. Facets are aligned. Occipital condyles are normally positioned. Skull base and vertebrae: No acute fracture. Soft tissues and spinal canal: No prevertebral fluid or swelling. No visible canal hematoma. Disc levels: No advanced spinal canal or neural foraminal stenosis. Upper chest: Biapical emphysema Other: Normal visualized paraspinal cervical soft tissues. IMPRESSION: 1. No  acute intracranial abnormality. 2. Chronic ischemic microangiopathy. 3. No acute fracture or static subluxation of the cervical spine. Electronically Signed   By: Ulyses Jarred M.D.   On: 08/09/2022 23:30   CT CERVICAL SPINE WO CONTRAST  Result Date: 07/28/2022 CLINICAL DATA:  Head trauma EXAM: CT HEAD WITHOUT CONTRAST CT CERVICAL SPINE WITHOUT CONTRAST TECHNIQUE: Multidetector CT imaging of the head and cervical spine was performed following the standard protocol without intravenous contrast. Multiplanar CT image reconstructions of the cervical spine were also generated. RADIATION DOSE REDUCTION: This exam was performed according to the departmental dose-optimization program which includes automated exposure control, adjustment of the mA and/or kV according to patient size and/or use of iterative reconstruction technique. COMPARISON:  None Available. FINDINGS: CT HEAD FINDINGS Brain: There is no mass, hemorrhage or extra-axial collection. The size and configuration of the ventricles and extra-axial CSF spaces are normal. There is hypoattenuation of the periventricular white matter, most commonly indicating chronic ischemic microangiopathy. Vascular: Atherosclerotic calcification of the vertebral and internal carotid arteries at the skull base. No abnormal hyperdensity of the major intracranial arteries or dural venous sinuses. Skull: The visualized skull base, calvarium and extracranial soft tissues are normal. Sinuses/Orbits: No fluid levels or advanced mucosal thickening of the visualized paranasal sinuses. No mastoid or middle ear effusion. The orbits are normal. CT CERVICAL SPINE FINDINGS Alignment: No static subluxation. Facets are aligned. Occipital condyles are normally positioned. Skull base and vertebrae: No acute fracture. Soft tissues and spinal canal: No prevertebral fluid or swelling. No visible canal hematoma. Disc levels: No advanced spinal canal or neural foraminal stenosis. Upper chest:  Biapical emphysema Other: Normal visualized paraspinal cervical soft tissues. IMPRESSION: 1. No acute intracranial abnormality. 2. Chronic ischemic microangiopathy. 3. No acute fracture or static subluxation of the cervical spine. Electronically Signed   By: Ulyses Jarred M.D.   On: 08/13/2022 23:30   DG Pelvis Portable  Result Date: 07/25/2022 CLINICAL DATA:  Status post fall. EXAM: PORTABLE PELVIS 1-2 VIEWS COMPARISON:  None Available. FINDINGS: Acute fracture deformity is seen extending through the neck of the proximal right femur. There is no evidence of dislocation. A chronic appearing deformity is suspected along the lateral aspect of the left iliac crest. Degenerative changes are seen involving both hips, as well as the visualized portion of the lower lumbar spine. Soft tissue structures are unremarkable. IMPRESSION: Acute fracture of the proximal right femur. Electronically Signed   By: Virgina Norfolk M.D.   On: 07/31/2022 23:13   DG FEMUR PORT, 1V RIGHT  Result Date: 07/27/2022 CLINICAL DATA:  Status post fall. EXAM: RIGHT FEMUR PORTABLE 1 VIEW COMPARISON:  None Available. FINDINGS: An acute fracture deformity is seen extending through the neck of the proximal right femur. Approximately 1/2 shaft width dorsal displacement of the distal fracture site is noted. There is no evidence of dislocation. Moderate severity vascular calcification is noted. IMPRESSION: Acute fracture of the proximal right femur. Electronically Signed   By: Virgina Norfolk M.D.   On: 08/02/2022 23:11   DG Chest Port 1 View  Result Date: 08/18/2022 CLINICAL DATA:  Status post fall. EXAM: PORTABLE CHEST 1 VIEW COMPARISON:  February 19, 2022 FINDINGS: The cardiac silhouette is mildly enlarged and unchanged in size. There is marked severity calcification of the aortic arch. Diffuse, chronic appearing increased interstitial lung markings are seen. Mild atelectatic changes are present within the bilateral lung bases. There is  no evidence of a pleural effusion or pneumothorax. Multilevel degenerative changes seen throughout the thoracic spine. IMPRESSION: Chronic appearing increased interstitial lung markings with mild bibasilar atelectasis. Electronically Signed   By: Virgina Norfolk M.D.   On: 08/13/2022 23:09   DG Knee Right Port  Result Date: 08/16/2022 CLINICAL DATA:  Status post fall. EXAM: PORTABLE RIGHT KNEE - 1-2 VIEW COMPARISON:  None Available. FINDINGS: No evidence of acute fracture, dislocation, or joint effusion. There is marked severity patellofemoral and medial tibiofemoral compartment space narrowing. Moderate severity vascular calcification is seen. IMPRESSION: Marked severity degenerative changes without an acute osseous abnormality. Electronically Signed   By: Virgina Norfolk M.D.   On: 07/28/2022 23:07    Microbiology: Recent Results (from the past 240 hour(s))  Surgical PCR screen     Status: None   Collection Time: 08/18/22  3:44 PM   Specimen: Nasal Mucosa; Nasal Swab  Result Value Ref Range Status   MRSA, PCR NEGATIVE NEGATIVE Final   Staphylococcus aureus NEGATIVE NEGATIVE Final    Comment: (NOTE) The Xpert SA Assay (FDA approved for NASAL specimens in patients 36 years of age and older), is one component of a comprehensive surveillance program. It is not intended to diagnose infection nor to guide or monitor treatment. Performed at [facility], [place] [address] [place], [place] [address]     Time spent: 30 minutes  Signed: Rodena Goldmann, DO 09/09/2022

## 2022-09-22 NOTE — Progress Notes (Signed)
   Palliative Medicine Inpatient Follow Up Note HPI: 89 y.o. female  with past medical history of HLD, HTN, hypothyroidism, not permanent A-fib, type 2 diabetes, chronic diastolic CHF, MI ([id number]), vitamin D deficiency, urinary incontinence and pulmonary HTN admitted on 08/02/2022 after being found down s/p unwitnessed fall at home the night before.   Today's Discussion 09/12/2022  *Please note that this is a verbal dictation therefore any spelling or grammatical errors are due to the "[employer]" system interpretation.  Chart reviewed inclusive of vital signs, progress notes, laboratory results, and diagnostic images.  Appears that patients course has become complicated in the setting of colonic distention and worsening AKI - Cr 4.52 / BUN 100.   Upon morning assessment, Marg was noted to be on bipap and quite somnolent. He extremetyis were cool and she had notable pedal - ankle edema.   Per Dr. Manuella Ghazi patient has declined tremendously. He was able to speak to patients son and the goals at this time will be those of keeping her comfortable.  I met with patients son. Mathew at bedside. We reviewed her present health-state and decline since admission. Discussed her not being a surgical candidate. Reviewed the plan for transition to comfort care. We talked about transition to comfort measures in house and what that would entail inclusive of medications to control pain, dyspnea, agitation, nausea, itching, and hiccups. We discussed stopping all uneccessary measures such as cardiac monitoring, blood draws, needle sticks, and frequent vital signs. Utilized reflective listening throughout our time together.   Questions and concerns addressed/Palliative Support Provided.   Objective Assessment: Vital Signs Vitals:   09/12/2022 0747 09/12/22 0914  BP: (!) 114/51   Pulse: 70 70  Resp: (!) 22 20  Temp: 97.7 F (36.5 C)   SpO2: 100% 96%    Intake/Output Summary (Last 24 hours) at September 12, 2022  1046 Last data filed at 12-Sep-2022 0600 Gross per 24 hour  Intake 1463.37 ml  Output 165 ml  Net 1298.37 ml    Last Weight  Most recent update: 09/12/2022 12:48 AM    Weight  55.6 kg (122 lb 9.2 oz)            Gen:  Frail elderly caucasian F in NAD HEENT: Dry mucous membranes CV: Regular rate and rhythm, no murmurs rubs or gallops PULM:  On Bipap ABD: soft/nontender  EXT: No edema  Neuro:  Drowsy   SUMMARY OF RECOMMENDATIONS   DNAR/DNI  Comfort care  Opoid gtt per primary team  Additional medications per [facility]  Anticipate death within hours to days  Ongoing PMT support  Time: 53 Billing based on MDM: High ______________________________________________________________________________________ [place] Team Team Cell Phone: [phone] Please utilize secure chat with additional questions, if there is no response within 30 minutes please call the above phone number  Palliative Medicine Team providers are available by phone from 7am to 7pm daily and can be reached through the team cell phone.  Should this patient require assistance outside of these hours, please call the patient's attending physician.

## 2022-09-22 NOTE — Progress Notes (Addendum)
Ana [name]  Assessment/ Plan: Pt is a 89 y.o. yo female   with past medical history significant for hypertension, hypothyroidism, type 2 diabetes, permanent A-fib on Coumadin, pulm hypertension, chronic diastolic CHF who was presented with fall, found down on the floor by family member with hip fracture seen as a consultation for further evaluation and management of acute kidney injury.   # Acute kidney injury presumably due to prerenal/hemodynamic changes in the setting of relative hypotension, pneumonia, anemia, + use of ARB at home.  UA with proteinuria.  CPK not much elevated.  Kidney ultrasound with chronicity and mild left hydro.  Bladder scan no urinary retention.  No other nephrotoxins including NSAIDs, IV contrast identified. Treated with IV fluid and albumin without improvement in kidney function.  Remains oliguric and both BUN and creatinine level worsening.  Noted her blood pressure is low and she is requiring BiPAP for respiratory distress.  I will DC fluid because of worsening respiratory status.  # GOC: I had a goals of care discussion with the patient's son today.  I explained that she may not be able to tolerate orthopedic surgery and not do well with long-term dialysis given her comorbidities condition, age, acute decline and physical deconditioning.  I discussed about palliative care and hospice management.  She is already DNR.  Also discussed with the primary team.   # Metabolic acidosis due to AKI: Treated with IV sodium bicarbonate.  Unable to take orally as he is on BiPAP.   # Acute metabolic encephalopathy: May have some component of uremia as well.  She is also on opioids for pain management.  Continue supportive care management as above.   # Anemia/coffee-ground emesis with supratherapeutic INR on admission: Monitor hemoglobin.   # Right femur fracture, fall: Seen by Ortho.  Subjective: Seen and examined at bedside.  Noted patient  had respiratory status requiring BiPAP.  Urine output is recorded only 165 cc in 24 hours.  Bladder scan with no acute urinary retention.  Discussed with the patient's son about worsening clinical condition. Objective Vital signs in last 24 hours: Vitals:   2022-09-14 0047 14-Sep-2022 0110 14-Sep-2022 0423 09/14/22 0747  BP: (!) 99/45   (!) 114/51  Pulse: 69   70  Resp: 19   (!) 22  Temp: 98.2 F (36.8 C)   97.7 F (36.5 C)  TempSrc: Oral   Oral  SpO2: 100% 100% 100% 100%  Weight: 55.6 kg     Height:       Weight change: -0.1 kg  Intake/Output Summary (Last 24 hours) at 09-14-22 0904 Last data filed at 2022/09/14 0600 Gross per 24 hour  Intake 1463.37 ml  Output 165 ml  Net 1298.37 ml       Labs: RENAL PANEL Recent Labs  Lab 07/29/2022 2246 08/18/22 1347 08/20/22 0053 08/05/2022 0029 08/22/22 0141 08/23/22 0107 09-14-22 0101  NA 137   < > 140 139 142 141 145  K 4.2   < > 4.5 4.4 4.3 4.2 4.3  CL 107   < > 107 108 107 110 110  CO2 19*   < > 20* 20* 20* 19* 20*  GLUCOSE 245*   < > 160* 77 193* 126* 188*  BUN 23   < > 50* 64* 85* 96* 100*  CREATININE 1.16*   < > 1.76* 2.67* 3.63* 4.30* 4.52*  CALCIUM 8.7*   < > 8.0* 7.6* 7.6* 7.5* 8.0*  MG  --   --   --   --   --   --  1.9  PHOS  --   --   --   --   --  5.0*  --   ALBUMIN 2.8*  --   --   --   --  2.0* 2.9*   < > = values in this interval not displayed.    Liver Function Tests: Recent Labs  Lab 08/11/2022 2246 08/23/22 0107 September 22, 2022 0101  AST 39  --   --   ALT 15  --   --   ALKPHOS 69  --   --   BILITOT 0.9  --   --   PROT 6.9  --   --   ALBUMIN 2.8* 2.0* 2.9*   No results for input(s): "LIPASE", "AMYLASE" in the last 168 hours. No results for input(s): "AMMONIA" in the last 168 hours. CBC: Recent Labs    08/18/2022 1916 08/20/22 0053 08/20/22 1840 08/23/22 0107 09/22/2022 0101  HGB 9.2* 9.6* 9.5* 9.0* 8.5*  MCV 92.6 94.1 89.8 90.1 91.2    Cardiac Enzymes: Recent Labs  Lab 08/04/2022 2246 07/24/2022 0028  08/23/22 0152  CKTOTAL 379* 670* 91   CBG: Recent Labs  Lab 08/23/22 0611 08/23/22 1142 08/23/22 1820 09-22-2022 0046 Sep 22, 2022 0623  GLUCAP 124* 134* 201* 187* 149*    Iron Studies: No results for input(s): "IRON", "TIBC", "TRANSFERRIN", "FERRITIN" in the last 72 hours. Studies/Results: US RENAL  Result Date: 08/23/2022 CLINICAL DATA:  Acute renal insufficiency EXAM: RENAL / URINARY TRACT ULTRASOUND COMPLETE COMPARISON:  None Available. FINDINGS: Right Kidney: Renal measurements: 9.5 x 4.5 x 4.8 cm = volume: 107 mL. Increased cortical echogenicity Left Kidney: Renal measurements: 9.6 x 4.5 x 4.4 cm = volume: 99 mL. Increased cortical echogenicity. Mild hydronephrosis. Bladder: Appears normal for degree of bladder distention. Other: None. IMPRESSION: 1. Increased cortical echogenicity in both kidneys consistent with medical renal disease. 2. Mild left hydronephrosis. Electronically Signed   By: Dorise Bullion III M.D.   On: 08/23/2022 10:54    Medications: Infusions:  sodium chloride 10 mL/hr at Sep 22, 2022 0323   ampicillin-sulbactam (UNASYN) IV Stopped (08/23/22 0944)   sodium bicarbonate 75 mEq in sodium chloride 0.45 % 1,075 mL infusion 75 mL/hr at Sep 22, 2022 0324    Scheduled Medications:  amLODipine  5 mg Oral Daily   insulin aspart  0-15 Units Subcutaneous Q6H   levothyroxine  50 mcg Oral Daily   melatonin  3 mg Oral QHS   metoprolol succinate  12.5 mg Oral Daily   pantoprazole (PROTONIX) IV  40 mg Intravenous Q12H    have reviewed scheduled and prn medications.  Physical Exam: General: Critically ill, elderly looking female on BiPAP.  Mild respite distress Heart:RRR, s1s2 nl Lungs: Coarse breath sound bilateral Abdomen:soft, Non-tender, non-distended Extremities:No edema Neurology: Alert, awake, somnolent and difficult to assess with BiPAP on.  Titus Drone Tanna Furry 22-Sep-2022,9:04 AM  LOS: 6 days

## 2022-09-22 NOTE — Progress Notes (Signed)
RT called to room by RN to take Pt off bipap per comfort care. Pt placed on 4L [place].

## 2022-09-22 NOTE — Progress Notes (Signed)
RT called to room, Pt currently on bipap 10/5 on 80% sats only 82% with a tidal volume of 200. RT increased O2 to 100% and increased settings to 12/6. Pt VS currently stable at this time with 96% sats. RT will continue to monitor as needed.

## 2022-09-22 NOTE — Progress Notes (Signed)
PROGRESS NOTE    Ana [name]  [id number] DOB: Sep 18, 1931 DOA: 08/03/2022 PCP: London Pepper, MD   Brief Narrative:    Ana [name] is an 89 y.o. female past medical history of diabetes mellitus, essential hypertension hypothyroidism, permanent atrial fibrillation on Coumadin and pulmonary hypertension who family members found her on the floor brought into the ED while in the ED developed coffee-ground emesis, with a supratherapeutic INR of 3, FOBT positive hemoglobin of 11, was complaining of right hip pain x-ray showed acute fracture of the right proximal femur, x-ray of the knee showed degenerative changes, CT of the head showed no acute findings chronic ischemic microangiopathy, C-spine showed no fracture or dislocation.  Chest x-ray with findings suggestive of aspiration pneumonia.   She now has worsening AKI and is not a candidate for hemodialysis.  She certainly would not survive surgery and appears to have a prognosis of hours to days.  Transition to comfort measures only 3/3 with anticipated in-hospital death.  Assessment & Plan:   Principal Problem:   Femur fracture, right (HCC) Active Problems:   Persistent atrial fibrillation (HCC)   Chronic diastolic CHF (congestive heart failure) (HCC)   Essential hypertension   Coronary atherosclerosis of native coronary artery   HLD (hyperlipidemia)   GI bleed   Supratherapeutic INR   Lactic acidosis   Fall at home, initial encounter   Coffee ground emesis   Closed fracture of right hip (HCC)   PAF (paroxysmal atrial fibrillation) (HCC)   Acute respiratory failure with hypoxia (HCC)   Aspiration pneumonia of both lower lobes (HCC)   AKI (acute kidney injury) ([place])  Assessment and Plan:   Acute respiratory failure with hypoxia, suspected aspiration pneumonia pneumonitis. Now worsening and requiring BiPAP likely in the setting of volume overload   Femur fracture, right (HCC) Not a candidate for hip repair at this point    Worsening AKI on CKD 3-now oliguric Not a candidate for hemodialysis, appreciate nephrology evaluation   Coffee-ground emesis/upper GI bleed/symptomatic anemia: INR 1.9. No reported recurrent upper GI bleed. Continue to monitor H&H   Persistent atrial fibrillation [facility]): INR now 1.5   Acute on chronic diastolic CHF (congestive heart failure) (HCC) Transition to comfort care as below   Essential hypertension: Discontinue home medications   Goals of care DNR-now comfort care Palliative care team following.  Patient is now comfort care only with anticipated prognosis of hours to days.  Discussed with son at bedside 3/3.   DVT prophylaxis: SCDs Code Status: DNR Family Communication: Son, [name] at bedside 3/3 Disposition Plan: Anticipated in-hospital death. Status is: Inpatient Remains inpatient appropriate because: Need for IV fluids and medications.  Consultants:  Orthopedics Nephrology Palliative care PCCM  Procedures:  None  Antimicrobials:  Anti-infectives (From admission, onward)    Start     Dose/Rate Route Frequency Ordered Stop   08/23/22 1000  Ampicillin-Sulbactam (UNASYN) 3 g in sodium chloride 0.9 % 100 mL IVPB        3 g 200 mL/hr over 30 Minutes Intravenous Every 24 hours 08/22/22 1132 2022/09/08 2359   07/28/2022 1300  ceFAZolin (ANCEF) IVPB 2g/100 mL premix        2 g 200 mL/hr over 30 Minutes Intravenous On call to O.R. 07/27/2022 0907 08/22/22 0559   08/20/22 1800  meropenem (MERREM) 500 mg in sodium chloride 0.9 % 100 mL IVPB  Status:  Discontinued        500 mg 200 mL/hr over 30 Minutes Intravenous Every 12  hours 08/20/22 0903 08/20/22 1451   08/20/22 1800  Ampicillin-Sulbactam (UNASYN) 3 g in sodium chloride 0.9 % 100 mL IVPB  Status:  Discontinued        3 g 200 mL/hr over 30 Minutes Intravenous Every 12 hours 08/20/22 1451 08/22/22 1132   08/15/2022 0400  linezolid (ZYVOX) IVPB 600 mg  Status:  Discontinued        600 mg 300 mL/hr over 60 Minutes  Intravenous Every 12 hours 08/07/2022 0202 08/20/22 1451   08/18/2022 0315  meropenem (MERREM) 1 g in sodium chloride 0.9 % 100 mL IVPB  Status:  Discontinued        1 g 200 mL/hr over 30 Minutes Intravenous Every 12 hours 08/04/2022 0226 08/20/22 0903   08/18/22 0800  Ampicillin-Sulbactam (UNASYN) 3 g in sodium chloride 0.9 % 100 mL IVPB  Status:  Discontinued       Note to Pharmacy: Unasyn 3 g IV q12h for CrCl < 30 mL/min   3 g 200 mL/hr over 30 Minutes Intravenous Every 12 hours 08/18/22 0633 08/18/22 0654   08/18/22 0800  ciprofloxacin (CIPRO) IVPB 200 mg  Status:  Discontinued        200 mg 100 mL/hr over 60 Minutes Intravenous 2 times daily 08/18/22 0654 08/18/22 0721   08/18/22 0800  metroNIDAZOLE (FLAGYL) IVPB 500 mg  Status:  Discontinued        500 mg 100 mL/hr over 60 Minutes Intravenous 2 times daily 08/18/22 0654 08/18/22 0727   08/18/22 0730  Ampicillin-Sulbactam (UNASYN) 3 g in sodium chloride 0.9 % 100 mL IVPB  Status:  Discontinued        3 g 200 mL/hr over 30 Minutes Intravenous Every 12 hours 08/18/22 0721 08/18/2022 0226      Subjective: Patient seen and evaluated today and is now requiring BiPAP support due to worsening hypoxemia overnight.  Discussed condition and poor prognosis with son at bedside.  Objective: Vitals:   2022/09/01 0110 2022/09/01 0423 2022/09/01 0747 01-Sep-2022 0914  BP:   (!) 114/51   Pulse:   70 70  Resp:   (!) 22 20  Temp:   97.7 F (36.5 C)   TempSrc:   Oral   SpO2: 100% 100% 100% 96%  Weight:      Height:        Intake/Output Summary (Last 24 hours) at 09-01-2022 0957 Last data filed at 09-01-2022 0600 Gross per 24 hour  Intake 1463.37 ml  Output 165 ml  Net 1298.37 ml   Filed Weights   08/22/22 0437 08/23/22 0443 2022/09/01 0047  Weight: 55.4 kg 55.7 kg 55.6 kg    Examination:  General exam: Appears calm and comfortable, hard of hearing, cachectic Respiratory system: Bilateral rales and currently on BiPAP 80% FiO2 Cardiovascular system: S1  & S2 heard, RRR.  Gastrointestinal system: Abdomen is soft Central nervous system: Alert and awake Extremities: No edema Skin: No significant lesions noted Psychiatry: Flat affect.    Data Reviewed: I have personally reviewed following labs and imaging studies  CBC: Recent Labs  Lab 08/03/2022 1916 08/20/22 0053 08/20/22 1840 08/23/22 0107 09-01-22 0101  WBC 20.9* 22.3* 13.2* 12.8* 11.6*  HGB 9.2* 9.6* 9.5* 9.0* 8.5*  HCT 28.7* 30.3* 28.3* 27.3* 26.0*  MCV 92.6 94.1 89.8 90.1 91.2  PLT 207 205 214 169 [id number]*   Basic Metabolic Panel: Recent Labs  Lab 08/20/22 0053 08/01/2022 0029 08/22/22 0141 08/23/22 0107 09/01/22 0101  NA 140 139 142 141 145  K  4.5 4.4 4.3 4.2 4.3  CL 107 108 107 110 110  CO2 20* 20* 20* 19* 20*  GLUCOSE 160* 77 193* 126* 188*  BUN 50* 64* 85* 96* 100*  CREATININE 1.76* 2.67* 3.63* 4.30* 4.52*  CALCIUM 8.0* 7.6* 7.6* 7.5* 8.0*  MG  --   --   --   --  1.9  PHOS  --   --   --  5.0*  --    GFR: Estimated Creatinine Clearance: 6.8 mL/min (A) (by C-G formula based on SCr of 4.52 mg/dL (H)). Liver Function Tests: Recent Labs  Lab 07/27/2022 2246 08/23/22 0107 2022-09-04 0101  AST 39  --   --   ALT 15  --   --   ALKPHOS 69  --   --   BILITOT 0.9  --   --   PROT 6.9  --   --   ALBUMIN 2.8* 2.0* 2.9*   No results for input(s): "LIPASE", "AMYLASE" in the last 168 hours. No results for input(s): "AMMONIA" in the last 168 hours. Coagulation Profile: Recent Labs  Lab 08/20/22 0053 08/07/2022 0029 08/22/22 0141 08/23/22 0107 2022/09/04 0101  INR 1.4* 1.8* 1.9* 1.8* 1.5*   Cardiac Enzymes: Recent Labs  Lab 08/13/2022 2246 08/13/2022 0028 08/23/22 0152  CKTOTAL 379* 670* 91   BNP (last 3 results) Recent Labs    02/19/22 1456  PROBNP 1,947*   HbA1C: No results for input(s): "HGBA1C" in the last 72 hours. CBG: Recent Labs  Lab 08/23/22 0611 08/23/22 1142 08/23/22 1820 04-Sep-2022 0046 09/04/22 0623  GLUCAP 124* 134* 201* 187* 149*   Lipid  Profile: No results for input(s): "CHOL", "HDL", "LDLCALC", "TRIG", "CHOLHDL", "LDLDIRECT" in the last 72 hours. Thyroid Function Tests: No results for input(s): "TSH", "T4TOTAL", "FREET4", "T3FREE", "THYROIDAB" in the last 72 hours. Anemia Panel: No results for input(s): "VITAMINB12", "FOLATE", "FERRITIN", "TIBC", "IRON", "RETICCTPCT" in the last 72 hours. Sepsis Labs: Recent Labs  Lab 07/28/2022 2246 08/18/22 0243 08/18/22 0648 08/18/22 1347  PROCALCITON  --   --   --  0.15  LATICACIDVEN 4.2* 3.8* 2.1* 1.6    Recent Results (from the past 240 hour(s))  Surgical PCR screen     Status: None   Collection Time: 08/18/22  3:44 PM   Specimen: Nasal Mucosa; Nasal Swab  Result Value Ref Range Status   MRSA, PCR NEGATIVE NEGATIVE Final   Staphylococcus aureus NEGATIVE NEGATIVE Final    Comment: (NOTE) The Xpert SA Assay (FDA approved for NASAL specimens in patients 30 years of age and older), is one component of a comprehensive surveillance program. It is not intended to diagnose infection nor to guide or monitor treatment. Performed at [facility], [place] [address] [place], [place] [address]          Radiology Studies: US RENAL  Result Date: 08/23/2022 CLINICAL DATA:  Acute renal insufficiency EXAM: RENAL / URINARY TRACT ULTRASOUND COMPLETE COMPARISON:  None Available. FINDINGS: Right Kidney: Renal measurements: 9.5 x 4.5 x 4.8 cm = volume: 107 mL. Increased cortical echogenicity Left Kidney: Renal measurements: 9.6 x 4.5 x 4.4 cm = volume: 99 mL. Increased cortical echogenicity. Mild hydronephrosis. Bladder: Appears normal for degree of bladder distention. Other: None. IMPRESSION: 1. Increased cortical echogenicity in both kidneys consistent with medical renal disease. 2. Mild left hydronephrosis. Electronically Signed   By: Dorise Bullion III M.D.   On: 08/23/2022 10:54        Scheduled Meds:  melatonin  3 mg Oral  QHS   pantoprazole (PROTONIX) IV  40 mg Intravenous  Q12H   Continuous Infusions:  sodium chloride 10 mL/hr at 16-Sep-2022 0323   ampicillin-sulbactam (UNASYN) IV Stopped (08/23/22 0944)   morphine       LOS: 6 days    Time spent: 35 minutes    Vandana Haman Darleen Crocker, DO [facility]  If 7PM-7AM, please contact night-coverage [email or web address] 09/16/2022, 9:57 AM

## 2022-09-22 NOTE — Progress Notes (Signed)
Ana [name] was made comfort care status by MD this morning, bipap discontinued placed on nasal cannnula for comfort, family at bedside, passed at 16:44 by Lucita Ferrara RN and Einar Grad RN, MD notified. Family too all belongings home. Morphine wasted by Chanetta Marshall RN 61m.

## 2022-09-22 NOTE — Progress Notes (Signed)
Events noted. Renal function worsening Respiratory issues overnight Medically the patient is not really stabilizing enough for hip fracture surgery. Will continue to follow with you for hip fracture fixation when indicated based on her medical status

## 2022-09-22 DEATH — deceased

## 2022-10-06 ENCOUNTER — Ambulatory Visit: Payer: Medicare HMO | Admitting: Internal Medicine
